# Patient Record
Sex: Male | Born: 1970
Health system: Southern US, Community
[De-identification: ages and names within clinical notes are randomized; demographics above are authoritative.]

## PROBLEM LIST (undated history)

## (undated) ENCOUNTER — Ambulatory Visit

## (undated) DIAGNOSIS — N186 End stage renal disease: Secondary | ICD-10-CM

## (undated) DIAGNOSIS — N179 Acute kidney failure, unspecified: Secondary | ICD-10-CM

## (undated) DIAGNOSIS — L0231 Cutaneous abscess of buttock: Secondary | ICD-10-CM

## (undated) DIAGNOSIS — E119 Type 2 diabetes mellitus without complications: Secondary | ICD-10-CM

## (undated) DIAGNOSIS — Z992 Dependence on renal dialysis: Secondary | ICD-10-CM

## (undated) DIAGNOSIS — H409 Unspecified glaucoma: Secondary | ICD-10-CM

## (undated) DIAGNOSIS — H3500 Unspecified background retinopathy: Secondary | ICD-10-CM

## (undated) DIAGNOSIS — I10 Essential (primary) hypertension: Secondary | ICD-10-CM

## (undated) DIAGNOSIS — F32A Depression, unspecified: Secondary | ICD-10-CM

## (undated) DIAGNOSIS — K3532 Acute appendicitis with perforation and localized peritonitis, without abscess: Secondary | ICD-10-CM

## (undated) DIAGNOSIS — E669 Obesity, unspecified: Secondary | ICD-10-CM

## (undated) DIAGNOSIS — L409 Psoriasis, unspecified: Secondary | ICD-10-CM

## (undated) DIAGNOSIS — E1140 Type 2 diabetes mellitus with diabetic neuropathy, unspecified: Secondary | ICD-10-CM

## (undated) HISTORY — PX: INCISION AND DRAINAGE: SHX5863

## (undated) HISTORY — PX: CATARACT EXTRACTION: SUR2

## (undated) HISTORY — PX: OTHER SURGICAL HISTORY: SHX169

## (undated) HISTORY — DX: Obesity, unspecified: E66.9

## (undated) HISTORY — DX: Psoriasis, unspecified: L40.9

## (undated) HISTORY — DX: Unspecified background retinopathy: H35.00

## (undated) HISTORY — DX: Unspecified glaucoma: H40.9

## (undated) HISTORY — DX: Cutaneous abscess of buttock: L02.31

## (undated) HISTORY — DX: Type 2 diabetes mellitus with diabetic neuropathy, unspecified: E11.40

## (undated) HISTORY — DX: Depression, unspecified: F32.A

## (undated) HISTORY — DX: Type 2 diabetes mellitus without complications: E11.9

## (undated) HISTORY — PX: ILEOCECETOMY: SHX5857

## (undated) HISTORY — DX: Dependence on renal dialysis: Z99.2

## (undated) HISTORY — PX: APPENDECTOMY: SHX54

---
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
## 2012-07-09 ENCOUNTER — Encounter (HOSPITAL_COMMUNITY): Payer: Self-pay | Admitting: Emergency Medicine

## 2012-07-09 ENCOUNTER — Emergency Department (HOSPITAL_COMMUNITY)
Admission: EM | Admit: 2012-07-09 | Discharge: 2012-07-09 | Disposition: A | Discharge status: Home / Self Care | Payer: BC Managed Care – PPO | Source: Home / Self Care | Attending: Emergency Medicine | Admitting: Emergency Medicine

## 2012-07-09 DIAGNOSIS — L0231 Cutaneous abscess of buttock: Secondary | ICD-10-CM

## 2012-07-09 MED ORDER — SULFAMETHOXAZOLE-TRIMETHOPRIM 800-160 MG PO TABS: 1.0000 | ORAL_TABLET | Freq: Two times a day (BID) | ORAL | Status: DC

## 2012-07-09 MED ORDER — NAPROXEN 500 MG PO TABS: 500.0000 mg | ORAL_TABLET | Freq: Two times a day (BID) | ORAL | Status: DC

## 2012-07-09 MED ORDER — CEPHALEXIN 500 MG PO CAPS: 1000.0000 mg | ORAL_CAPSULE | Freq: Three times a day (TID) | ORAL | Status: DC

## 2012-07-09 NOTE — ED Notes (Signed)
Patient reports bump on buttocks.  Denies history of the same.  Reports pimple that has become infected on left buttocks.  Noticed bump sunday

## 2012-07-09 NOTE — ED Provider Notes (Signed)
History    CSN: [id number] Arrival date & time 07/09/12  1119  First MD Initiated Contact with Patient 07/09/12 1242     Chief Complaint  Patient presents with  . Abscess   (Consider location/radiation/quality/duration/timing/severity/associated sxs/prior Treatment) HPI Comments: 42 year old male presents complaining of red swollen area on the inside of his left butt cheek that has been getting more swollen and painful for the past 3 days. He thinks it started as a pimple that has gotten infected.  History reviewed. No pertinent past medical history. History reviewed. No pertinent past surgical history. No family history on file. History  Substance Use Topics  . Smoking status: Never Smoker   . Smokeless tobacco: Not on file  . Alcohol Use: No    Review of Systems  Constitutional: Negative for fever, chills and fatigue.  HENT: Negative for sore throat, neck pain and neck stiffness.   Eyes: Negative for visual disturbance.  Respiratory: Negative for cough and shortness of breath.   Cardiovascular: Negative for chest pain, palpitations and leg swelling.  Gastrointestinal: Negative for nausea, vomiting, abdominal pain, diarrhea and constipation.  Genitourinary: Negative for dysuria, urgency, frequency and hematuria.  Musculoskeletal: Negative for myalgias and arthralgias.  Skin: Positive for wound (skin infection at gluteal cleft, see HPI).  Neurological: Negative for dizziness, weakness and light-headedness.    Allergies  Review of patient's allergies indicates no known allergies.  Home Medications   Current Outpatient Rx  Name  Route  Sig  Dispense  Refill  . naproxen (NAPROSYN) 500 MG tablet   Oral   Take 1 tablet (500 mg total) by mouth 2 (two) times daily.   60 tablet   0   . sulfamethoxazole-trimethoprim (SEPTRA DS) 800-160 MG per tablet   Oral   Take 1 tablet by mouth every 12 (twelve) hours.   14 tablet   0    BP 131/81  Pulse 114  Temp(Src) 97.9 F  (36.6 C) (Oral)  Resp 16  SpO2 98% Physical Exam  Constitutional: He is oriented to person, place, and time. He appears well-developed and well-nourished. No distress.  HENT:  Head: Normocephalic and atraumatic.  Eyes: EOM are normal. Pupils are equal, round, and reactive to light.  Cardiovascular: Normal rate and regular rhythm.  Exam reveals no gallop and no friction rub.   No murmur heard. Pulmonary/Chest: Effort normal and breath sounds normal. No respiratory distress. He has no wheezes. He has no rales.  Abdominal: Soft. There is no tenderness.  Neurological: He is oriented to person, place, and time.  Skin: Skin is warm and dry. No rash noted.  Area of erythematous, swelling, induration on the left superior gluteal cleft. Induration spans about 8 cm vertically. No definite area of purulence and fluctuance  Psychiatric: He has a normal mood and affect. Judgment normal.    ED Course  Procedures (including critical care time) Labs Reviewed - No data to display No results found. 1. Abscess, gluteal cleft    Risks and benefits of incision and drainage procedure explained to patient. He elected to proceed. Skin cleaned with alcohol swab. Local anesthesia with 2% lidocaine with epinephrine. Adequate analgesia obtained. Abscess incised with minimal purulent, bloody drainage. Wound further explored with ultrasound, no fluid collection is seen. It was packed with 3 cm of quarter inch iodoform gauze and dressed.   MDM  Take antibiotics as directed. Warm sitz baths 2-3 times daily. Followup in 3 days.   Meds ordered this encounter  Medications  . DISCONTD:  cephALEXin (KEFLEX) 500 MG capsule    Sig: Take 2 capsules (1,000 mg total) by mouth 3 (three) times daily.    Dispense:  42 capsule    Refill:  0  . DISCONTD: naproxen (NAPROSYN) 500 MG tablet    Sig: Take 1 tablet (500 mg total) by mouth 2 (two) times daily.    Dispense:  60 tablet    Refill:  0  . sulfamethoxazole-trimethoprim  (SEPTRA DS) 800-160 MG per tablet    Sig: Take 1 tablet by mouth every 12 (twelve) hours.    Dispense:  14 tablet    Refill:  0  . naproxen (NAPROSYN) 500 MG tablet    Sig: Take 1 tablet (500 mg total) by mouth 2 (two) times daily.    Dispense:  60 tablet    Refill:  0        Liam Graham, PA-C 07/09/12 1431

## 2012-07-09 NOTE — ED Provider Notes (Signed)
Medical screening examination/treatment/procedure(s) were performed by non-physician practitioner and as supervising physician I was immediately available for consultation/collaboration.  Burnett Kanaris, MD 07/09/12 1517

## 2012-07-14 ENCOUNTER — Encounter (INDEPENDENT_AMBULATORY_CARE_PROVIDER_SITE_OTHER): Payer: Self-pay | Admitting: Surgery

## 2012-07-14 ENCOUNTER — Emergency Department (INDEPENDENT_AMBULATORY_CARE_PROVIDER_SITE_OTHER)
Admission: EM | Admit: 2012-07-14 | Discharge: 2012-07-14 | Disposition: A | Discharge status: Home / Self Care | Payer: BC Managed Care – PPO | Source: Home / Self Care | Attending: Family Medicine | Admitting: Family Medicine

## 2012-07-14 ENCOUNTER — Ambulatory Visit (INDEPENDENT_AMBULATORY_CARE_PROVIDER_SITE_OTHER): Payer: BC Managed Care – PPO | Admitting: Surgery

## 2012-07-14 VITALS — BP 118/78 | HR 76 | Temp 97.3°F | Resp 16 | Ht 71.0 in | Wt 197.4 lb

## 2012-07-14 DIAGNOSIS — L0231 Cutaneous abscess of buttock: Secondary | ICD-10-CM

## 2012-07-14 DIAGNOSIS — K611 Rectal abscess: Secondary | ICD-10-CM | POA: Insufficient documentation

## 2012-07-14 DIAGNOSIS — K612 Anorectal abscess: Secondary | ICD-10-CM

## 2012-07-14 MED ORDER — TRAMADOL HCL 50 MG PO TABS: 50.0000 mg | ORAL_TABLET | Freq: Four times a day (QID) | ORAL | Status: DC | PRN

## 2012-07-14 MED ORDER — IBUPROFEN 600 MG PO TABS: 600.0000 mg | ORAL_TABLET | Freq: Three times a day (TID) | ORAL | Status: DC | PRN

## 2012-07-14 MED ORDER — DOXYCYCLINE HYCLATE 100 MG PO TABS: 100.0000 mg | ORAL_TABLET | Freq: Two times a day (BID) | ORAL | Status: DC

## 2012-07-14 MED ORDER — HYDROCODONE-ACETAMINOPHEN 5-325 MG PO TABS: 1.0000 | ORAL_TABLET | ORAL | Status: DC | PRN

## 2012-07-14 NOTE — Progress Notes (Signed)
Subjective:     Patient ID: Alex Morrison, male   DOB: Aug 24, 1970, 42 y.o.   MRN: [name]  HPI He is sent over here by urgent care for evaluation of a perirectal abscess. Apparently an incision and drainage was done 5 days ago and he was told to leave the packing in place. He was placed on Septra. He returned to urgent care and was referred here  Review of Systems     Objective:   Physical Exam On exam, there still a moderate amount of induration and cellulitis. I prepped the area Betadine, anesthetized with lidocaine, made the incision larger with a scalpel and then entered a larger abscess cavity with a hemostat. I then packed this with gauze    Assessment:     Perirectal abscess     Plan:     I am going to change him to doxycycline and also start him on Vicodin.  He needs to see the urgent office this Thursday for wound check

## 2012-07-14 NOTE — ED Notes (Signed)
States following up on boil.   States still in constant pain, no drainage.  Experienced some vomiting this morning only.  Antibiotics was given and patient is still taken.  Patient was given naproxen but made his stomach hurt so he only is taking Ibuprofen.

## 2012-07-14 NOTE — ED Provider Notes (Signed)
History    CSN: [id number] Arrival date & time 07/14/12  1145  First MD Initiated Contact with Patient 07/14/12 1208     Chief Complaint  Patient presents with  . Recurrent Skin Infections   (Consider location/radiation/quality/duration/timing/severity/associated sxs/prior Treatment) HPI Comments: 42 year old nondiabetic male here for followup of a left gluteal abscess. Patient had I&D and packing by one of our providers here on June 25 (as per records there was minimal purulent drainage after I&D on last visit) No wound culture pending. Patient understood he had to "keep the packing for as long as possible" and comes today because he's pain is not improving. Denies significant drainage. Denies fever or chills. He is currently taking Septra date 5/10. Is taking naproxen for pain inconsistently as reports naproxen is "making the heart flutter". Denies painful defecation. No history of Crohn's or ulcerative colitis.   No past medical history on file. No past surgical history on file. No family history on file. History  Substance Use Topics  . Smoking status: Never Smoker   . Smokeless tobacco: Not on file  . Alcohol Use: No    Review of Systems  Constitutional: Negative for fever and chills.  Gastrointestinal: Negative for nausea and vomiting.  Skin: Positive for wound.  Neurological: Negative for dizziness and headaches.  All other systems reviewed and are negative.    Allergies  Review of patient's allergies indicates no known allergies.  Home Medications   Current Outpatient Rx  Name  Route  Sig  Dispense  Refill  . ibuprofen (ADVIL,MOTRIN) 600 MG tablet   Oral   Take 1 tablet (600 mg total) by mouth every 8 (eight) hours as needed for pain.   21 tablet   0   . sulfamethoxazole-trimethoprim (SEPTRA DS) 800-160 MG per tablet   Oral   Take 1 tablet by mouth every 12 (twelve) hours.   14 tablet   0   . traMADol (ULTRAM) 50 MG tablet   Oral   Take 1 tablet (50 mg  total) by mouth every 6 (six) hours as needed for pain.   20 tablet   0    BP 124/82  Pulse 102  Temp(Src) 98.4 F (36.9 C) (Oral)  Resp 18  SpO2 97% Physical Exam  Nursing note and vitals reviewed. Constitutional: He is oriented to person, place, and time. He appears well-developed and well-nourished. No distress.  Eyes: No scleral icterus.  Cardiovascular: Normal heart sounds.   Pulmonary/Chest: Breath sounds normal.  Abdominal: Soft. There is no tenderness.  Neurological: He is alert and oriented to person, place, and time.  Skin: He is not diaphoretic.  Mid left gluteal area: close to gluteal cleft. There is an area of induration with skin erythema and central skin peeling of about 5 cm x 7 cm, no fluctuations, there is a small less than 1 cm opening round wound with a small amount of packing about 2 cm that was easily pulled out. Small amount of purulent drainage. Area is tender to palpation.      ED Course  INCISION AND DRAINAGE Performed by: Randa Spike Authorized by: Randa Spike Consent: Verbal consent obtained. Risks and benefits: risks, benefits and alternatives were discussed Consent given by: patient Patient understanding: patient states understanding of the procedure being performed Patient consent: the patient's understanding of the procedure matches consent given Type: abscess Location: mid left gluteal area close to gluteal cleft. Anesthesia: local infiltration Local anesthetic: lidocaine 1% with epinephrine Anesthetic total: 2 ml Scalpel size:  11 Incision type: revision of prior incition. Complexity: simple Drainage: purulent Drainage amount: scant Comments: I kept getting constant small purulent return by compresing indurated tissue in lower medial area concerning for a deeper pocket . Area was cleaned and applied a dry dressing. Wound culture pending.   (including critical care time) Labs Reviewed  CULTURE, ROUTINE-ABSCESS   No results  found. 1. Abscess, gluteal, left     MDM  Impress possible deeper pus pocket I am not able to reach given constant small amount of purulent return by compression of indurated tissue close to the gluteal cleft. Wound culture pending. D/c'd naproxen, prescribed ibuprofen and tramadol. Scheduled appointment at [facility] [place] today.   Randa Spike, MD 07/14/12 [phone]

## 2012-07-15 ENCOUNTER — Telehealth (INDEPENDENT_AMBULATORY_CARE_PROVIDER_SITE_OTHER): Payer: Self-pay | Admitting: General Surgery

## 2012-07-15 ENCOUNTER — Inpatient Hospital Stay (HOSPITAL_COMMUNITY)
Admission: EM | Admit: 2012-07-15 | Discharge: 2012-07-18 | DRG: 294 | Disposition: A | Discharge status: Home / Self Care | Payer: BC Managed Care – PPO | Attending: Internal Medicine | Admitting: Internal Medicine

## 2012-07-15 ENCOUNTER — Encounter (HOSPITAL_COMMUNITY): Payer: Self-pay | Admitting: Emergency Medicine

## 2012-07-15 DIAGNOSIS — E1110 Type 2 diabetes mellitus with ketoacidosis without coma: Secondary | ICD-10-CM | POA: Diagnosis present

## 2012-07-15 DIAGNOSIS — Z833 Family history of diabetes mellitus: Secondary | ICD-10-CM

## 2012-07-15 DIAGNOSIS — E1010 Type 1 diabetes mellitus with ketoacidosis without coma: Principal | ICD-10-CM | POA: Diagnosis present

## 2012-07-15 DIAGNOSIS — K612 Anorectal abscess: Secondary | ICD-10-CM | POA: Diagnosis present

## 2012-07-15 DIAGNOSIS — E876 Hypokalemia: Secondary | ICD-10-CM | POA: Diagnosis present

## 2012-07-15 DIAGNOSIS — K611 Rectal abscess: Secondary | ICD-10-CM | POA: Diagnosis present

## 2012-07-15 DIAGNOSIS — L408 Other psoriasis: Secondary | ICD-10-CM | POA: Diagnosis present

## 2012-07-15 DIAGNOSIS — Z794 Long term (current) use of insulin: Secondary | ICD-10-CM

## 2012-07-15 LAB — CBC WITH DIFFERENTIAL/PLATELET
Basophils Absolute: 0 10*3/uL (ref 0.0–0.1)
Basophils Relative: 0 % (ref 0–1)
Hemoglobin: 16.2 g/dL (ref 13.0–17.0)
MCHC: 35.4 g/dL (ref 30.0–36.0)
Monocytes Relative: 8 % (ref 3–12)
Neutro Abs: 10.9 10*3/uL — ABNORMAL HIGH (ref 1.7–7.7)
Neutrophils Relative %: 81 % — ABNORMAL HIGH (ref 43–77)
RDW: 13 % (ref 11.5–15.5)
WBC: 13.5 10*3/uL — ABNORMAL HIGH (ref 4.0–10.5)

## 2012-07-15 LAB — COMPREHENSIVE METABOLIC PANEL
ALT: 9 U/L (ref 0–53)
AST: 9 U/L (ref 0–37)
Albumin: 3.6 g/dL (ref 3.5–5.2)
Alkaline Phosphatase: 132 U/L — ABNORMAL HIGH (ref 39–117)
Chloride: 98 mEq/L (ref 96–112)
Potassium: 5.5 mEq/L — ABNORMAL HIGH (ref 3.5–5.1)
Total Bilirubin: 0.3 mg/dL (ref 0.3–1.2)

## 2012-07-15 MED ORDER — SODIUM CHLORIDE 0.9 % IV BOLUS (SEPSIS)
2000.0000 mL | Freq: Once | INTRAVENOUS | Status: AC
  Administered 2012-07-16: 2000 mL via INTRAVENOUS

## 2012-07-15 MED ORDER — SODIUM CHLORIDE 0.9 % IV SOLN: INTRAVENOUS | Status: DC

## 2012-07-15 MED ORDER — ONDANSETRON HCL 4 MG/2ML IJ SOLN
4.0000 mg | INTRAMUSCULAR | Status: DC | PRN
  Administered 2012-07-16: 4 mg via INTRAVENOUS
  Filled 2012-07-15: qty 2

## 2012-07-15 NOTE — Telephone Encounter (Signed)
He was calling to see if we had sent in his RX to the [facility] for antibiotics. I reviewed his chart and made him aware his doxycycline was sent to [facility] on [address] because this is the pharmacy we have on file. He will pick this up from there. He is asking if this peri-rectal abscess could cause fatigue. He states he gets "worn out" walking across his house. I explained that it should not be related. If this continues he should see his primary medical MD. He will call with any questions/problems.

## 2012-07-15 NOTE — ED Notes (Signed)
Patient states cannot give urine sample at this time.

## 2012-07-15 NOTE — ED Notes (Signed)
PT. REPORTS NAUSEA AND VOMITTING ONSET Sunday , DENIES DIARRHEA , NO FEVER OR CHILLS.

## 2012-07-16 ENCOUNTER — Encounter (HOSPITAL_COMMUNITY): Payer: Self-pay | Admitting: Internal Medicine

## 2012-07-16 ENCOUNTER — Emergency Department (HOSPITAL_COMMUNITY): Payer: BC Managed Care – PPO

## 2012-07-16 DIAGNOSIS — K612 Anorectal abscess: Secondary | ICD-10-CM

## 2012-07-16 DIAGNOSIS — E1110 Type 2 diabetes mellitus with ketoacidosis without coma: Secondary | ICD-10-CM

## 2012-07-16 LAB — POCT I-STAT 3, VENOUS BLOOD GAS (G3P V): Acid-base deficit: 22 mmol/L — ABNORMAL HIGH (ref 0.0–2.0)

## 2012-07-16 LAB — URINALYSIS W MICROSCOPIC + REFLEX CULTURE
Glucose, UA: >1000 mg/dL — AB
Ketones, ur: >80 mg/dL — AB
Protein, ur: 30 mg/dL — AB

## 2012-07-16 LAB — BASIC METABOLIC PANEL
BUN: 26 mg/dL — ABNORMAL HIGH (ref 6–23)
CO2: 16 mEq/L — ABNORMAL LOW (ref 19–32)
CO2: 18 mEq/L — ABNORMAL LOW (ref 19–32)
Calcium: 9.6 mg/dL (ref 8.4–10.5)
Calcium: 9.7 mg/dL (ref 8.4–10.5)
Chloride: 112 mEq/L (ref 96–112)
Creatinine, Ser: 1.03 mg/dL (ref 0.50–1.35)
Creatinine, Ser: 0.97 mg/dL (ref 0.50–1.35)
GFR calc Af Amer: >90 mL/min (ref >90)
GFR calc Af Amer: >90 mL/min (ref >90)
GFR calc Af Amer: >90 mL/min (ref >90)
GFR calc non Af Amer: 86 mL/min — ABNORMAL LOW (ref >90)
GFR calc non Af Amer: 88 mL/min — ABNORMAL LOW (ref >90)
GFR calc non Af Amer: >90 mL/min (ref >90)
Glucose, Bld: 336 mg/dL — ABNORMAL HIGH (ref 70–99)
Glucose, Bld: 139 mg/dL — ABNORMAL HIGH (ref 70–99)
Potassium: 4.9 mEq/L (ref 3.5–5.1)
Potassium: 4.2 mEq/L (ref 3.5–5.1)
Potassium: 3.5 mEq/L (ref 3.5–5.1)
Sodium: 140 mEq/L (ref 135–145)
Sodium: 142 mEq/L (ref 135–145)
Sodium: 141 mEq/L (ref 135–145)

## 2012-07-16 LAB — LIPID PANEL
HDL: 53 mg/dL (ref >39)
Total CHOL/HDL Ratio: 3.9 RATIO
Triglycerides: 102 mg/dL (ref <150)

## 2012-07-16 LAB — CBC WITH DIFFERENTIAL/PLATELET
Basophils Absolute: 0 10*3/uL (ref 0.0–0.1)
Eosinophils Absolute: 0 10*3/uL (ref 0.0–0.7)
HCT: 39 % (ref 39.0–52.0)
Lymphocytes Relative: 11 % — ABNORMAL LOW (ref 12–46)
MCHC: 35.4 g/dL (ref 30.0–36.0)
Monocytes Relative: 4 % (ref 3–12)
Neutro Abs: 12.4 10*3/uL — ABNORMAL HIGH (ref 1.7–7.7)
Neutrophils Relative %: 85 % — ABNORMAL HIGH (ref 43–77)
Platelets: 254 10*3/uL (ref 150–400)
RDW: 13.2 % (ref 11.5–15.5)
WBC Morphology: INCREASED
WBC: 14.6 10*3/uL — ABNORMAL HIGH (ref 4.0–10.5)

## 2012-07-16 LAB — GLUCOSE, CAPILLARY
Glucose-Capillary: 118 mg/dL — ABNORMAL HIGH (ref 70–99)
Glucose-Capillary: 125 mg/dL — ABNORMAL HIGH (ref 70–99)
Glucose-Capillary: 150 mg/dL — ABNORMAL HIGH (ref 70–99)
Glucose-Capillary: 151 mg/dL — ABNORMAL HIGH (ref 70–99)
Glucose-Capillary: 162 mg/dL — ABNORMAL HIGH (ref 70–99)
Glucose-Capillary: 179 mg/dL — ABNORMAL HIGH (ref 70–99)
Glucose-Capillary: 183 mg/dL — ABNORMAL HIGH (ref 70–99)
Glucose-Capillary: 216 mg/dL — ABNORMAL HIGH (ref 70–99)
Glucose-Capillary: 222 mg/dL — ABNORMAL HIGH (ref 70–99)
Glucose-Capillary: 241 mg/dL — ABNORMAL HIGH (ref 70–99)
Glucose-Capillary: 390 mg/dL — ABNORMAL HIGH (ref 70–99)
Glucose-Capillary: 513 mg/dL — ABNORMAL HIGH (ref 70–99)

## 2012-07-16 LAB — MRSA PCR SCREENING: MRSA by PCR: NEGATIVE

## 2012-07-16 LAB — LIPASE, BLOOD: Lipase: 27 U/L (ref 11–59)

## 2012-07-16 LAB — HEMOGLOBIN A1C: Hgb A1c MFr Bld: 12.7 % — ABNORMAL HIGH (ref <5.7)

## 2012-07-16 LAB — OSMOLALITY: Osmolality: 338 mOsm/kg — ABNORMAL HIGH (ref 275–300)

## 2012-07-16 MED ORDER — POTASSIUM CHLORIDE 10 MEQ/100ML IV SOLN
10.0000 meq | INTRAVENOUS | Status: AC
  Administered 2012-07-16 (×4): 10 meq via INTRAVENOUS

## 2012-07-16 MED ORDER — SODIUM CHLORIDE 0.9 % IV SOLN: INTRAVENOUS | Status: DC

## 2012-07-16 MED ORDER — SODIUM CHLORIDE 0.9 % IV SOLN
INTRAVENOUS | Status: DC
  Administered 2012-07-16 – 2012-07-17 (×4): via INTRAVENOUS

## 2012-07-16 MED ORDER — SODIUM CHLORIDE 0.9 % IV SOLN
INTRAVENOUS | Status: DC
  Administered 2012-07-16: 4.3 [IU]/h via INTRAVENOUS
  Filled 2012-07-16: qty 1

## 2012-07-16 MED ORDER — SODIUM CHLORIDE 0.9 % IV SOLN
INTRAVENOUS | Status: DC
  Administered 2012-07-16: 4.5 [IU]/h via INTRAVENOUS
  Filled 2012-07-16: qty 1

## 2012-07-16 MED ORDER — LEVOFLOXACIN IN D5W 750 MG/150ML IV SOLN
750.0000 mg | INTRAVENOUS | Status: DC
  Administered 2012-07-16 – 2012-07-17 (×2): 750 mg via INTRAVENOUS
  Filled 2012-07-16 (×3): qty 150

## 2012-07-16 MED ORDER — DEXTROSE 50 % IV SOLN: 25.0000 mL | INTRAVENOUS | Status: DC | PRN

## 2012-07-16 MED ORDER — VANCOMYCIN HCL 10 G IV SOLR
1750.0000 mg | Freq: Once | INTRAVENOUS | Status: AC
  Administered 2012-07-16: 1750 mg via INTRAVENOUS
  Filled 2012-07-16: qty 1750

## 2012-07-16 MED ORDER — LIVING WELL WITH DIABETES BOOK
Freq: Once | Status: AC
  Administered 2012-07-16: 12:00:00
  Filled 2012-07-16: qty 1

## 2012-07-16 MED ORDER — DEXTROSE-NACL 5-0.45 % IV SOLN
INTRAVENOUS | Status: DC
  Administered 2012-07-16: 05:00:00 via INTRAVENOUS

## 2012-07-16 MED ORDER — SODIUM CHLORIDE 0.9 % IV BOLUS (SEPSIS)
1000.0000 mL | Freq: Once | INTRAVENOUS | Status: AC
  Administered 2012-07-16: 1000 mL via INTRAVENOUS

## 2012-07-16 MED ORDER — POTASSIUM CHLORIDE 10 MEQ/100ML IV SOLN
INTRAVENOUS | Status: AC
  Filled 2012-07-16: qty 400

## 2012-07-16 MED ORDER — VANCOMYCIN HCL IN DEXTROSE 1-5 GM/200ML-% IV SOLN
1000.0000 mg | Freq: Three times a day (TID) | INTRAVENOUS | Status: DC
  Administered 2012-07-16 – 2012-07-18 (×6): 1000 mg via INTRAVENOUS
  Filled 2012-07-16 (×9): qty 200

## 2012-07-16 MED ORDER — DEXTROSE-NACL 5-0.45 % IV SOLN: INTRAVENOUS | Status: DC

## 2012-07-16 MED ORDER — ENOXAPARIN SODIUM 40 MG/0.4ML ~~LOC~~ SOLN
40.0000 mg | SUBCUTANEOUS | Status: DC
  Administered 2012-07-16: 40 mg via SUBCUTANEOUS
  Filled 2012-07-16: qty 0.4

## 2012-07-16 NOTE — ED Notes (Signed)
Admitting MD at bedside, pt awaiting inpt beds assignment.

## 2012-07-16 NOTE — Progress Notes (Signed)
CRITICAL VALUE ALERT  Critical value received: CO2 7  Date of notification:  07/16/12  Time of notification:  0510  Critical value read back: yes  Nurse who received alert: Honor Loh RN  MD notified (1st page): Text paged Forrest Moron   Time of first [id number]

## 2012-07-16 NOTE — Progress Notes (Signed)
INITIAL NUTRITION ASSESSMENT  DOCUMENTATION CODES Per approved criteria  -Not Applicable   INTERVENTION:  Advance diet as medically appropriate  RD to follow for nutrition care plan, add interventions accordingly  NUTRITION DIAGNOSIS: Inadequate oral intake related to inability to eat as evidenced by NPO status  Goal: Patient to meet >/= 90% of their estimated nutrition needs  Monitor:  PO diet advancement & intake, appropriateness for education, weight, labs, I/O's  Reason for Assessment: Consult, Malnutrition Screening Tool Report  42 y.o. male  Admitting Dx: DKA (diabetic ketoacidosis)  ASSESSMENT: Patient no significant PMH admitted with nausea & vomiting for last 2-3 days; last week was found to have perirectal abscess which was incised and drained at Urgent Care; in ER patient was found to have high blood sugar with anion gap and acidosis.   RD unable to obtain nutrition hx at time of visit; patient laying in bed with vomit on pillow; per admission nutrition screen, patient reports he'd recently lost weight as well as eating poorly because of a decreased appetite.  RD consulted for new-onset DM diet education ---> patient not appropriate at this time.  Height: Ht Readings from Last 1 Encounters:  07/16/12 5' 10.87" (1.8 m)    Weight: Wt Readings from Last 1 Encounters:  07/16/12 198 lb 6.6 oz (90 kg)    Ideal Body Weight: 166 lb  % Ideal Body Weight: 119%  Wt Readings from Last 10 Encounters:  07/16/12 198 lb 6.6 oz (90 kg)  07/14/12 197 lb 6.4 oz (89.54 kg)    Usual Body Weight: unable to obtain  % Usual Body Weight: ---  BMI:  Body mass index is 27.78 kg/(m^2).  Estimated Nutritional Needs: Kcal: 2000-2200 Protein: 100-110 gm Fluid: 2.0-2.2 L  Skin: incision   Diet Order: NPO  EDUCATION NEEDS: -Education not appropriate at this time   Intake/Output Summary (Last 24 hours) at 07/16/12 1406 Last data filed at 07/16/12 1200  Gross per 24  hour  Intake 2103.84 ml  Output    800 ml  Net 1303.84 ml    Labs:   Recent Labs Lab 07/15/12 2206 07/16/12 0400 07/16/12 0835  NA 136 140 140  K 5.5* 4.9 4.2  CL 98 108 110  CO2 <7* 7* 12*  BUN 23 27* 26*  CREATININE 1.09 1.05 1.03  CALCIUM 10.6* 9.6 9.6  GLUCOSE 455* 336* 233*    CBG (last 3)   Recent Labs  07/16/12 0934 07/16/12 1030 07/16/12 1133  GLUCAP 190* 183* 179*    Scheduled Meds: . vancomycin  1,000 mg Intravenous Q8H    Continuous Infusions: . sodium chloride Stopped (07/16/12 0523)  . dextrose 5 % and 0.45% NaCl 125 mL/hr at 07/16/12 0526  . insulin (NOVOLIN-R) infusion 7.3 Units/hr (07/16/12 1328)    Past Medical History  Diagnosis Date  . Abscess of buttock     Past Surgical History  Procedure Laterality Date  . Incision and drainage      Arthur Holms, RD, LDN Pager #: [phone] After-Hours Pager #: [phone]

## 2012-07-16 NOTE — H&P (Addendum)
[facility] History and Physical  Alex Morrison [id number] DOB: 1970-11-07 DOA: 07/15/2012  Referring physician: ER physician. PCP: No PCP Per Patient  Specialists: None.  Chief Complaint: Nausea vomiting and weakness.  HPI: Alex Morrison is a 42 y.o. male with no significant past medical history has been experiencing nausea vomiting for last 2-3 days. Patient also has been having weakness and fatigue. Patient last week was found to have a perirectal abscess which was incised and drained at the urgent care and patient was placed on antibiotics and had followed with Dr. Ninfa Linden surgeon who had changed antibiotics to doxycycline. Patient otherwise denies any diarrhea abdominal pain chest pain shortness of breath any focal deficit headache or any blurred vision. In the ER patient was found to have high blood sugar with anion gap and acidosis. Initially critical care was consulted by ER physician and at this time hospitalist has been requested for admission.  Review of Systems: As presented in the history of presenting illness, rest negative.  Past Medical History  Diagnosis Date  . Abscess of buttock    Past Surgical History  Procedure Laterality Date  . Incision and drainage     Social History:  reports that he has never smoked. He does not have any smokeless tobacco history on file. He reports that he does not drink alcohol or use illicit drugs. Home. where does patient live-- Can do ADLs. Can patient participate in ADLs?  No Known Allergies  Family History  Problem Relation Age of Onset  . Diabetes Mellitus II Mother   . Diabetes Mellitus I Father   . Diabetes Mellitus II Brother       Prior to Admission medications   Medication Sig Start Date End Date Taking? Authorizing Provider  HYDROcodone-acetaminophen (NORCO) 5-325 MG per tablet Take 1-2 tablets by mouth every 4 (four) hours as needed for pain. 07/14/12  Yes Harl Bowie, MD  ibuprofen (ADVIL,MOTRIN)  600 MG tablet Take 1 tablet (600 mg total) by mouth every 8 (eight) hours as needed for pain. 07/14/12  Yes Adlih Moreno-Coll, MD  sulfamethoxazole-trimethoprim (SEPTRA DS) 800-160 MG per tablet Take 1 tablet by mouth every 12 (twelve) hours. 07/09/12  Yes Liam Graham, PA-C  traMADol (ULTRAM) 50 MG tablet Take 1 tablet (50 mg total) by mouth every 6 (six) hours as needed for pain. 07/14/12  Yes Adlih Moreno-Coll, MD  doxycycline (VIBRA-TABS) 100 MG tablet Take 1 tablet (100 mg total) by mouth 2 (two) times daily. 07/14/12   Harl Bowie, MD   Physical Exam: Filed Vitals:   07/15/12 2202 07/16/12 0150 07/16/12 0200  BP: 132/102  149/76  Pulse: 108 130 127  Temp: 97.9 F (36.6 C) 98.3 F (36.8 C)   TempSrc: Oral    Resp: 14  26  SpO2: 98% 100% 100%     General:  Well-developed well-nourished.  Eyes: Anicteric no pallor.  ENT: No discharge from the ears eyes nose mouth. Tongue looks coated black.  Neck: No mass.  Cardiovascular: S1-S2 heard.  Respiratory: No rhonchi or crepitations.  Abdomen: Soft nontender bowel sounds present.  Skin: Psoriatic skin lesions. Left buttock has dressing which is stained.  Musculoskeletal: No edema.  Psychiatric: Appears normal.  Neurologic: Alert awake oriented to time place and person. Moves all extremities.  Labs on Admission:  Basic Metabolic Panel:  Recent Labs Lab 07/15/12 2206  NA 136  K 5.5*  CL 98  CO2 <7*  GLUCOSE 455*  BUN 23  CREATININE  1.09  CALCIUM 10.6*   Liver Function Tests:  Recent Labs Lab 07/15/12 2206  AST 9  ALT 9  ALKPHOS 132*  BILITOT 0.3  PROT 7.7  ALBUMIN 3.6    Recent Labs Lab 07/15/12 2354  LIPASE 27   No results found for this basename: AMMONIA,  in the last 168 hours CBC:  Recent Labs Lab 07/15/12 2206  WBC 13.5*  NEUTROABS 10.9*  HGB 16.2  HCT 45.8  MCV 82.5  PLT 280   Cardiac Enzymes: No results found for this basename: CKTOTAL, CKMB, CKMBINDEX, TROPONINI,  in the  last 168 hours  BNP (last 3 results) No results found for this basename: PROBNP,  in the last 8760 hours CBG:  Recent Labs Lab 07/16/12 0024 07/16/12 0210  GLUCAP 513* 390*    Radiological Exams on Admission: Dg Chest Port 1 View  07/16/2012   *RADIOLOGY REPORT*  Clinical Data: Vomiting.  PORTABLE CHEST - 1 VIEW  Comparison: None.  Findings: Lungs are clear.  Heart size is normal.  No pneumothorax or pleural effusion.  IMPRESSION: Negative exam.   Original Report Authenticated By: Orlean Patten, M.D.   EKG shows sinus tachycardia.  Assessment/Plan Principal Problem:   DKA (diabetic ketoacidosis) Active Problems:   Perirectal abscess   1. Diabetic ketoacidosis with new-onset diabetes mellitus - patient has severe diabetic ketoacidosis. I think patient's DKA was precipitated by his perirectal abscess. Patient has had received 4 L normal saline bolus. Patient will be placed on additional fluid with aggressive hydration and IV insulin infusion until anion gap gets corrected. Check hemoglobin A1c. Closely follow metabolic panel. Patient will be admitted to step down. Check troponins. Patient does have mild hyperkalemia which I think will improve with IV insulin. 2. Perirectal abscess status post I&D - check wound culture. Patient at this time has been placed on vancomycin IV. 3. Nausea vomiting probably secondary to diabetic ketoacidosis -  Abdomen appears benign on exam. Follow clinically. 4. History of Psoriasis.    Code Status: Full code.  Family Communication: Patient's brother at the bedside.  Disposition Plan: Admit to inpatient.    Mikalah Skyles N. [facility] Pager [phone].  If 7PM-7AM, please contact night-coverage [email or web address] Password [facility] 07/16/2012, 2:51 AM

## 2012-07-16 NOTE — Progress Notes (Addendum)
[facility] Progress Note [place] TEAM 1 - Stepdown/ICU TEAM   Alex Morrison [id number] DOB: [birth year] DOA: 07/15/2012 PCP: No PCP Per Patient  Brief narrative: 42 yo male with no significant past medical history had been experiencing nausea with vomiting for 2-3 days with associated weakness and fatigue. Patient was found to have a perirectal abscess in the week prior to his admit which was incised and drained at an urgent care.  Patient was placed on antibiotics and had followed with Dr. Ninfa Linden (General Surgeon) who had changed antibiotics to doxycycline. Patient otherwise denied diarrhea abdominal pain chest pain shortness of breath headache or blurred vision. In the ER patient was found to have high blood sugar with anion gap and acidosis.  Assessment/Plan:  DKA with newly diagnosed DM  Perirectal abscess s/p I&D  Code Status: FULL Family Communication: No family present at time of exam Disposition Plan: SDU  Consultants: none  Procedures: none  Antibiotics: Vancomycin 7/2 >> Levaquin 7/2 >>  DVT prophylaxis: lovenox  HPI/Subjective: Followup visit completed   Objective: Blood pressure 114/63, pulse 103, temperature 98.9 F (37.2 C), temperature source Oral, resp. rate 18, height 5' 10.87" (1.8 m), weight 90 kg (198 lb 6.6 oz), SpO2 98.00%.  Intake/Output Summary (Last 24 hours) at 07/16/12 1250 Last data filed at 07/16/12 1200  Gross per 24 hour  Intake 2103.84 ml  Output    800 ml  Net 1303.84 ml    Exam: Followup exam completed  Data Reviewed: Basic Metabolic Panel:  Recent Labs Lab 07/15/12 2206 07/16/12 0400 07/16/12 0835  NA 136 140 140  K 5.5* 4.9 4.2  CL 98 108 110  CO2 <7* 7* 12*  GLUCOSE 455* 336* 233*  BUN 23 27* 26*  CREATININE 1.09 1.05 1.03  CALCIUM 10.6* 9.6 9.6   Liver Function Tests:  Recent Labs Lab 07/15/12 2206  AST 9  ALT 9  ALKPHOS 132*  BILITOT 0.3  PROT 7.7  ALBUMIN 3.6    Recent Labs Lab  07/15/12 2354  LIPASE 27   CBC:  Recent Labs Lab 07/15/12 2206 07/16/12 0400  WBC 13.5* 14.6*  NEUTROABS 10.9* 12.4*  HGB 16.2 13.8  HCT 45.8 39.0  MCV 82.5 81.4  PLT 280 254   Cardiac Enzymes:  Recent Labs Lab 07/16/12 0400  TROPONINI <0.30   CBG:  Recent Labs Lab 07/16/12 0723 07/16/12 0828 07/16/12 0934 07/16/12 1030 07/16/12 1133  GLUCAP 216* 223* 190* 183* 179*    Recent Results (from the past 240 hour(s))  CULTURE, ROUTINE-ABSCESS     Status: None   Collection Time    07/14/12  3:32 PM      Result Value Range Status   Specimen Description ABSCESS BUTTOCKS LEFT   Final   Special Requests NONE   Final   Gram Stain     Final   Value: NO WBC SEEN     NO SQUAMOUS EPITHELIAL CELLS SEEN     RARE GRAM POSITIVE COCCI IN PAIRS   Culture Culture reincubated for better growth   Final   Report Status PENDING   Incomplete  MRSA PCR SCREENING     Status: None   Collection Time    07/16/12  2:53 AM      Result Value Range Status   MRSA by PCR NEGATIVE  NEGATIVE Final   Comment:            The GeneXpert MRSA Assay (FDA     approved for NASAL specimens  only), is one component of a     comprehensive MRSA colonization     surveillance program. It is not     intended to diagnose MRSA     infection nor to guide or     monitor treatment for     MRSA infections.     Studies:  Recent x-ray studies have been reviewed in detail by the Attending Physician  Scheduled Meds:  Scheduled Meds: . enoxaparin (LOVENOX) injection  40 mg Subcutaneous Q24H  . vancomycin  1,000 mg Intravenous Q8H   Continuous Infusions: . sodium chloride Stopped (07/16/12 0523)  . dextrose 5 % and 0.45% NaCl 125 mL/hr at 07/16/12 0526  . insulin (NOVOLIN-R) infusion 7.2 Units/hr (07/16/12 1230)    Time spent on care of this patient: 25+ mins  [place]  [phone] Pager - Text Page per Shea Evans as per below:  On-Call/Text Page:       [email or web address]      password [email or web address]  If 7PM-7AM, please contact night-coverage [email or web address] Password [email or web address] 07/16/2012, 12:50 PM   LOS: 1 day

## 2012-07-16 NOTE — Progress Notes (Signed)
Utilization Review Completed. 07/16/2012

## 2012-07-16 NOTE — Progress Notes (Signed)
Pt slept through most of shift: easily arousable. 2 episodes emesis. Parents are coming in from [place] today. Will start Diabetes education and encourage pt to choose a PCP tomorrow when he is feeling better.

## 2012-07-16 NOTE — Progress Notes (Signed)
7/2  Spoke with patient about his current condition with new onset diabetes.  States that his mother and father both have diabetes.  Was seen at an Urgent Care last week for a perirectal abscess drain, but no blood work was done at that time.  Has not been feeling well over the last 2-3 days.  Is not feeling well at this time, but I told him that staff would be following up with diabetes education including having him watch DM videos, teaching him to check own CBGs, speak with a dietician, and to be sure that he has a PCP to follow him at discharge.  HgbA1C is being checked. Staff nurse to follow up with education.  Will continue to follow while in hospital.

## 2012-07-16 NOTE — ED Provider Notes (Signed)
History    CSN: [name] Arrival date & time 07/15/12  2148  First MD Initiated Contact with Patient 07/15/12 2334     Chief Complaint  Patient presents with  . Emesis    HPI Pt was seen at 2345.  Per pt and his friend, c/o gradual onset and worsening of multiple intermittent episodes of N/V that began 3 days ago. Has been associated with generalized fatigue/weakness. Denies diarrhea, no fevers, no CP/palpitations, no SOB/cough, no abd pain, no black or blood in stools or emesis.    Past Medical History  Diagnosis Date  . Abscess of buttock    History reviewed. No pertinent past surgical history.  History  Substance Use Topics  . Smoking status: Never Smoker   . Smokeless tobacco: Not on file  . Alcohol Use: No    Review of Systems ROS: Statement: All systems negative except as marked or noted in the HPI; Constitutional: Negative for fever and chills. +generalized fatigue/weakness.; ; Eyes: Negative for eye pain, redness and discharge. ; ; ENMT: Negative for ear pain, hoarseness, nasal congestion, sinus pressure and sore throat. ; ; Cardiovascular: Negative for chest pain, palpitations, diaphoresis, dyspnea and peripheral edema. ; ; Respiratory: Negative for cough, wheezing and stridor. ; ; Gastrointestinal: +N/V. Negative for diarrhea, abdominal pain, blood in stool, hematemesis, jaundice and rectal bleeding. . ; ; Genitourinary: Negative for dysuria, flank pain and hematuria. ; ; Musculoskeletal: Negative for back pain and neck pain. Negative for swelling and trauma.; ; Skin: Negative for pruritus, rash, abrasions, blisters, bruising and skin lesion.; ; Neuro: Negative for headache, lightheadedness and neck stiffness. Negative for altered level of consciousness , altered mental status, extremity weakness, paresthesias, involuntary movement, seizure and syncope.       Allergies  Review of patient's allergies indicates no known allergies.  Home Medications   Current  Outpatient Rx  Name  Route  Sig  Dispense  Refill  . doxycycline (VIBRA-TABS) 100 MG tablet   Oral   Take 1 tablet (100 mg total) by mouth 2 (two) times daily.   14 tablet   2   . HYDROcodone-acetaminophen (NORCO) 5-325 MG per tablet   Oral   Take 1-2 tablets by mouth every 4 (four) hours as needed for pain.   30 tablet   1   . ibuprofen (ADVIL,MOTRIN) 600 MG tablet   Oral   Take 1 tablet (600 mg total) by mouth every 8 (eight) hours as needed for pain.   21 tablet   0   . sulfamethoxazole-trimethoprim (SEPTRA DS) 800-160 MG per tablet   Oral   Take 1 tablet by mouth every 12 (twelve) hours.   14 tablet   0   . traMADol (ULTRAM) 50 MG tablet   Oral   Take 1 tablet (50 mg total) by mouth every 6 (six) hours as needed for pain.   20 tablet   0    BP 132/102  Pulse 108  Temp(Src) 97.9 F (36.6 C) (Oral)  Resp 14  SpO2 98%  Physical Exam 2350: Physical examination:  Nursing notes reviewed; Vital signs and O2 SAT reviewed;  Constitutional: Thin, Uncomfortable appearing; Head:  Normocephalic, atraumatic; Eyes: EOMI, PERRL, No scleral icterus; ENMT: Mouth and pharynx normal, Mucous membranes dry; Neck: Supple, Full range of motion, No lymphadenopathy; Cardiovascular: Tachycardic rate and rhythm, No gallop; Respiratory: Breath sounds clear & equal bilaterally, No wheezes.  Speaking full sentences, Tachypneic, no retrax or access mm use. Normal respiratory effort/excursion; Chest: Nontender, Movement  normal; Abdomen: Soft, Nontender, Nondistended, Normal bowel sounds; Genitourinary: No CVA tenderness; Extremities: Pulses normal, No tenderness, No edema, No calf edema or asymmetry.; Neuro: AA&Ox3, Major CN grossly intact.  Speech clear. No gross focal motor or sensory deficits in extremities.; Skin: Color normal, Warm, Dry.   ED Course  Procedures    MDM  MDM Reviewed: previous chart, nursing note and vitals Interpretation: labs, ECG and x-ray Total time providing critical  care: 30-74 minutes. This excludes time spent performing separately reportable procedures and services. Consults: admitting MD   CRITICAL CARE Performed by: Alfonzo Feller Total critical care time: 35 Critical care time was exclusive of separately billable procedures and treating other patients. Critical care was necessary to treat or prevent imminent or life-threatening deterioration. Critical care was time spent personally by me on the following activities: development of treatment plan with patient and/or surrogate as well as nursing, discussions with consultants, evaluation of patient's response to treatment, examination of patient, obtaining history from patient or surrogate, ordering and performing treatments and interventions, ordering and review of laboratory studies, ordering and review of radiographic studies, pulse oximetry and re-evaluation of patient's condition.    Date: 07/16/2012  Rate: 129  Rhythm: sinus tachycardia  QRS Axis: normal  Intervals: normal  ST/T Wave abnormalities: nonspecific ST/T changes  Conduction Disutrbances:none  Narrative Interpretation:   Old EKG Reviewed: none available.  Results for orders placed during the hospital encounter of 07/15/12  CBC WITH DIFFERENTIAL      Result Value Range   WBC 13.5 (*) 4.0 - 10.5 K/uL   RBC 5.55  4.22 - 5.81 MIL/uL   Hemoglobin 16.2  13.0 - 17.0 g/dL   HCT 45.8  39.0 - 52.0 %   MCV 82.5  78.0 - 100.0 fL   MCH 29.2  26.0 - 34.0 pg   MCHC 35.4  30.0 - 36.0 g/dL   RDW 13.0  11.5 - 15.5 %   Platelets 280  150 - 400 K/uL   Neutrophils Relative % 81 (*) 43 - 77 %   Neutro Abs 10.9 (*) 1.7 - 7.7 K/uL   Lymphocytes Relative 10 (*) 12 - 46 %   Lymphs Abs 1.4  0.7 - 4.0 K/uL   Monocytes Relative 8  3 - 12 %   Monocytes Absolute 1.1 (*) 0.1 - 1.0 K/uL   Eosinophils Relative 0  0 - 5 %   Eosinophils Absolute 0.0  0.0 - 0.7 K/uL   Basophils Relative 0  0 - 1 %   Basophils Absolute 0.0  0.0 - 0.1 K/uL   COMPREHENSIVE METABOLIC PANEL      Result Value Range   Sodium 136  135 - 145 mEq/L   Potassium 5.5 (*) 3.5 - 5.1 mEq/L   Chloride 98  96 - 112 mEq/L   CO2 <7 (*) 19 - 32 mEq/L   Glucose, Bld 455 (*) 70 - 99 mg/dL   BUN 23  6 - 23 mg/dL   Creatinine, Ser 1.09  0.50 - 1.35 mg/dL   Calcium 10.6 (*) 8.4 - 10.5 mg/dL   Total Protein 7.7  6.0 - 8.3 g/dL   Albumin 3.6  3.5 - 5.2 g/dL   AST 9  0 - 37 U/L   ALT 9  0 - 53 U/L   Alkaline Phosphatase 132 (*) 39 - 117 U/L   Total Bilirubin 0.3  0.3 - 1.2 mg/dL   GFR calc non Af Amer 82 (*) >90 mL/min   GFR calc  Af Amer >90  >90 mL/min  GLUCOSE, CAPILLARY      Result Value Range   Glucose-Capillary 513 (*) 70 - 99 mg/dL  POCT I-STAT 3, BLOOD GAS (G3P V)      Result Value Range   pH, Ven 7.112 (*) 7.250 - 7.300   pCO2, Ven 15.0 (*) 45.0 - 50.0 mmHg   pO2, Ven 57.0 (*) 30.0 - 45.0 mmHg   Bicarbonate 4.8 (*) 20.0 - 24.0 mEq/L   TCO2 5  0 - 100 mmol/L   O2 Saturation 80.0     Acid-base deficit 22.0 (*) 0.0 - 2.0 mmol/L   Sample type VENOUS     Comment NOTIFIED PHYSICIAN     Dg Chest Port 1 View 07/16/2012   *RADIOLOGY REPORT*  Clinical Data: Vomiting.  PORTABLE CHEST - 1 VIEW  Comparison: None.  Findings: Lungs are clear.  Heart size is normal.  No pneumothorax or pleural effusion.  IMPRESSION: Negative exam.   Original Report Authenticated By: Orlean Patten, M.D.     0045:  New onset DM with DKA.  AG 31.  Pt denies hx of same.  IVF bolus in process, IV insulin gtt to be started. Dx and testing d/w pt and family.  Questions answered.  Verb understanding, agreeable to admit. T/C to PCCM Dr. Joya Gaskins, case discussed, including:  HPI, pertinent PM/SHx, VS/PE, dx testing, ED course and treatment:  requests to call Hospitalist service, CCM can consult prn. T/C to [facility] Dr. Hal Hope, case discussed, including:  HPI, pertinent PM/SHx, VS/PE, dx testing, ED course and treatment:  Agreeable to admit, requests to give IVF 3L total, write temporary  orders, obtain stepdown bed to team 10.   Alfonzo Feller, DO 07/17/12 2205

## 2012-07-16 NOTE — Progress Notes (Signed)
ANTIBIOTIC CONSULT NOTE - INITIAL  Pharmacy Consult for Vancomycin Indication: abcess  No Known Allergies  Patient Measurements: Height: 5' 10.87" (180 cm) Weight: 198 lb 6.6 oz (90 kg) IBW/kg (Calculated) : 74.99   Vital Signs: Temp: 98.3 F (36.8 C) (07/02 0150) Temp src: Oral (07/01 2202) BP: 149/76 mmHg (07/02 0200) Pulse Rate: 127 (07/02 0200)  Labs:  Recent Labs  07/15/12 2206  WBC 13.5*  HGB 16.2  PLT 280  CREATININE 1.09   Estimated Creatinine Clearance: 101.1 ml/min (by C-G formula based on Cr of 1.09). No results found for this basename: VANCOTROUGH, Corlis Leak, VANCORANDOM, GENTTROUGH, GENTPEAK, GENTRANDOM, TOBRATROUGH, TOBRAPEAK, TOBRARND, AMIKACINPEAK, AMIKACINTROU, AMIKACIN,  in the last 72 hours   Microbiology: Recent Results (from the past 720 hour(s))  CULTURE, ROUTINE-ABSCESS     Status: None   Collection Time    07/14/12  3:32 PM      Result Value Range Status   Specimen Description ABSCESS BUTTOCKS LEFT   Final   Special Requests NONE   Final   Gram Stain     Final   Value: NO WBC SEEN     NO SQUAMOUS EPITHELIAL CELLS SEEN     RARE GRAM POSITIVE COCCI IN PAIRS   Culture Culture reincubated for better growth   Final   Report Status PENDING   Incomplete    Medical History: Past Medical History  Diagnosis Date  . Abscess of buttock     Medications:  Prescriptions prior to admission  Medication Sig Dispense Refill  . HYDROcodone-acetaminophen (NORCO) 5-325 MG per tablet Take 1-2 tablets by mouth every 4 (four) hours as needed for pain.  30 tablet  1  . ibuprofen (ADVIL,MOTRIN) 600 MG tablet Take 1 tablet (600 mg total) by mouth every 8 (eight) hours as needed for pain.  21 tablet  0  . sulfamethoxazole-trimethoprim (SEPTRA DS) 800-160 MG per tablet Take 1 tablet by mouth every 12 (twelve) hours.  14 tablet  0  . traMADol (ULTRAM) 50 MG tablet Take 1 tablet (50 mg total) by mouth every 6 (six) hours as needed for pain.  20 tablet  0  .  doxycycline (VIBRA-TABS) 100 MG tablet Take 1 tablet (100 mg total) by mouth 2 (two) times daily.  14 tablet  2   Assessment: 42 yo male with perirectal abcess for empiric antibiotics  Goal of Therapy:  Vancomycin trough level 10-15 mcg/ml  Plan:  Vancomycin 1750 mg IV now, then 1 g IV q8h  Caryl Pina 07/16/2012,3:01 AM

## 2012-07-17 ENCOUNTER — Encounter (INDEPENDENT_AMBULATORY_CARE_PROVIDER_SITE_OTHER): Payer: BC Managed Care – PPO | Admitting: General Surgery

## 2012-07-17 DIAGNOSIS — E1110 Type 2 diabetes mellitus with ketoacidosis without coma: Secondary | ICD-10-CM | POA: Diagnosis present

## 2012-07-17 DIAGNOSIS — E1310 Other specified diabetes mellitus with ketoacidosis without coma: Secondary | ICD-10-CM

## 2012-07-17 LAB — BASIC METABOLIC PANEL
BUN: 24 mg/dL — ABNORMAL HIGH (ref 6–23)
CO2: 19 mEq/L (ref 19–32)
Calcium: 9.9 mg/dL (ref 8.4–10.5)
Chloride: 111 mEq/L (ref 96–112)
Creatinine, Ser: 0.85 mg/dL (ref 0.50–1.35)
Glucose, Bld: 158 mg/dL — ABNORMAL HIGH (ref 70–99)

## 2012-07-17 LAB — CBC
MCH: 28.2 pg (ref 26.0–34.0)
MCV: 80.5 fL (ref 78.0–100.0)
Platelets: 225 10*3/uL (ref 150–400)
RBC: 4.4 MIL/uL (ref 4.22–5.81)
RDW: 13.5 % (ref 11.5–15.5)
WBC: 10.3 10*3/uL (ref 4.0–10.5)

## 2012-07-17 LAB — GLUCOSE, CAPILLARY
Glucose-Capillary: 147 mg/dL — ABNORMAL HIGH (ref 70–99)
Glucose-Capillary: 331 mg/dL — ABNORMAL HIGH (ref 70–99)
Glucose-Capillary: 338 mg/dL — ABNORMAL HIGH (ref 70–99)
Glucose-Capillary: 322 mg/dL — ABNORMAL HIGH (ref 70–99)

## 2012-07-17 LAB — CULTURE, ROUTINE-ABSCESS: Gram Stain: NONE SEEN

## 2012-07-17 MED ORDER — DEXTROSE 50 % IV SOLN: 25.0000 mL | Freq: Once | INTRAVENOUS | Status: DC | PRN

## 2012-07-17 MED ORDER — INSULIN GLARGINE 100 UNIT/ML ~~LOC~~ SOLN
20.0000 [IU] | Freq: Every day | SUBCUTANEOUS | Status: DC
  Administered 2012-07-17: 20 [IU] via SUBCUTANEOUS
  Filled 2012-07-17 (×2): qty 0.2

## 2012-07-17 MED ORDER — INSULIN ASPART 100 UNIT/ML ~~LOC~~ SOLN
0.0000 [IU] | Freq: Every day | SUBCUTANEOUS | Status: DC
  Administered 2012-07-17: 3 [IU] via SUBCUTANEOUS

## 2012-07-17 MED ORDER — BOOST / RESOURCE BREEZE PO LIQD: 1.0000 | Freq: Three times a day (TID) | ORAL | Status: DC | PRN

## 2012-07-17 MED ORDER — INSULIN PEN STARTER KIT
1.0000 | Freq: Once | Status: DC
  Filled 2012-07-17: qty 1

## 2012-07-17 MED ORDER — GLUCOSE 40 % PO GEL: 1.0000 | ORAL | Status: DC | PRN

## 2012-07-17 MED ORDER — DEXTROSE 50 % IV SOLN: 50.0000 mL | Freq: Once | INTRAVENOUS | Status: DC | PRN

## 2012-07-17 MED ORDER — INSULIN GLARGINE 100 UNIT/ML ~~LOC~~ SOLN
10.0000 [IU] | Freq: Every day | SUBCUTANEOUS | Status: DC
  Administered 2012-07-17: 10 [IU] via SUBCUTANEOUS
  Filled 2012-07-17 (×2): qty 0.1

## 2012-07-17 MED ORDER — DEXTROSE 50 % IV SOLN: 50.0000 mL | INTRAVENOUS | Status: DC | PRN

## 2012-07-17 MED ORDER — INSULIN ASPART 100 UNIT/ML ~~LOC~~ SOLN
0.0000 [IU] | Freq: Three times a day (TID) | SUBCUTANEOUS | Status: DC
  Administered 2012-07-17 (×3): 7 [IU] via SUBCUTANEOUS
  Administered 2012-07-18: 5 [IU] via SUBCUTANEOUS

## 2012-07-17 NOTE — Progress Notes (Signed)
7/3  May need to increase Novolog correction scale to MODERATE AC & HS if CBGs continue greater than 180 mg/dl.  Harvel Ricks RN BSN CDE

## 2012-07-17 NOTE — Progress Notes (Signed)
7/3  Visited with patient and was able to teach use of the insulin pen.  Patient returned demonstration well.  Suggested to staff RN that he give his own injection for the next dosage of insulin. Patient could watch video #511 on using the insulin pen.  Ordered the insulin pen starter kit.  Outpatient education was entered for [facility] to contact patient for follow up after discharge.  Patient has [employer]. Patient on Lantus and Novolog insulin in the hospital.  Patient feeling better today.  Will continue to follow while in hospital.  Harvel Ricks RN BSN CDE

## 2012-07-17 NOTE — Plan of Care (Signed)
Problem: Food- and Nutrition-Related Knowledge Deficit (NB-1.1) Goal: Nutrition education Formal process to instruct or train a patient/client in a skill or to impart knowledge to help patients/clients voluntarily manage or modify food choices and eating behavior to maintain or improve health. Outcome: Completed/Met Date Met:  07/17/12  RD consulted for nutrition education regarding diabetes.     Lab Results  Component Value Date    HGBA1C 12.7* 07/16/2012    RD provided "Carbohydrate Counting for People with Diabetes" handout from the Academy of [employer] Dietetics. Discussed different food groups and their effects on blood sugar, emphasizing carbohydrate-containing foods. Provided list of carbohydrates and recommended serving sizes of common foods.  Discussed importance of controlled and consistent carbohydrate intake throughout the day. Provided examples of ways to balance meals/snacks and encouraged intake of high-fiber, whole grain complex carbohydrates. Teach back method used.  Expect good compliance.  Initial nutrition assessment completed 7/2.  Patient's appetite poor; amenable to [employer] supplements PRN.  RD to order.  Arthur Holms, RD, LDN Pager #: [phone] After-Hours Pager #: [phone]

## 2012-07-17 NOTE — Progress Notes (Signed)
[facility] Progress Note [place] TEAM 1 - Stepdown/ICU TEAM   Alex Morrison [id number] DOB: 07-21-1970 DOA: 07/15/2012 PCP: No PCP Per Patient  Brief narrative: 42 yo male with no significant past medical history had been experiencing nausea with vomiting for 2-3 days with associated weakness and fatigue. Patient was found to have a perirectal abscess in the week prior to his admit which was incised and drained at an urgent care.  Patient was placed on antibiotics and had followed with Dr. Ninfa Linden (General Surgeon) who had changed antibiotics to doxycycline. Patient otherwise denied diarrhea abdominal pain chest pain shortness of breath headache or blurred vision. In the ER patient was found to have high blood sugar with anion gap and acidosis.  Assessment/Plan:  DKA with newly diagnosed DM - A1c 12.7 - increase Lantus to 20 and start Novolog with meals - start diabetes teaching -outpt diabetes referral -will need PCP on d/c  Perirectal abscess s/p I&D - was on Doxy and or Septra as outpt - currently on Vanc and Levaquin - wound culture from 7/2 negative - will need home health to do dressing changes at home  Code Status: FULL Family Communication: No family present at time of exam Disposition Plan: SDU  Consultants: none  Procedures: none  Antibiotics: Vancomycin 7/2 >> Levaquin 7/2 >>  DVT prophylaxis: lovenox  HPI/Subjective: Pt doing well- have discussed diabetes with him and need for insulin- He admits to poor appetite today   Objective: Blood pressure 138/84, pulse 101, temperature 98.3 F (36.8 C), temperature source Oral, resp. rate 21, height 5' 10.87" (1.8 m), weight 90 kg (198 lb 6.6 oz), SpO2 98.00%.  Intake/Output Summary (Last 24 hours) at 07/17/12 1104 Last data filed at 07/17/12 0800  Gross per 24 hour  Intake   3180 ml  Output   1450 ml  Net   1730 ml    Exam: General: Well-developed well-nourished.  Eyes: Anicteric no  pallor.  ENT: No discharge from the ears eyes nose mouth. Tongue looks coated black.  Neck: No mass.  Cardiovascular: S1-S2 heard.  Respiratory: No rhonchi or crepitations.  Abdomen: Soft nontender bowel sounds present.  Skin: Psoriatic skin lesions. Left buttock has dressing which is stained.  Musculoskeletal: No edema.  Psychiatric: Appears normal.  Neurologic: Alert awake oriented to time place and person. Moves all extremities.   Data Reviewed: Basic Metabolic Panel:  Recent Labs Lab 07/16/12 0400 07/16/12 0835 07/16/12 1417 07/16/12 1942 07/17/12 0020  NA 140 140 142 141 141  K 4.9 4.2 3.8 3.5 3.5  CL 108 110 112 111 111  CO2 7* 12* 16* 18* 19  GLUCOSE 336* 233* 156* 139* 158*  BUN 27* 26* 26* 25* 24*  CREATININE 1.05 1.03 0.97 0.84 0.85  CALCIUM 9.6 9.6 9.8 9.7 9.9   Liver Function Tests:  Recent Labs Lab 07/15/12 2206  AST 9  ALT 9  ALKPHOS 132*  BILITOT 0.3  PROT 7.7  ALBUMIN 3.6    Recent Labs Lab 07/15/12 2354  LIPASE 27   CBC:  Recent Labs Lab 07/15/12 2206 07/16/12 0400 07/17/12 0450  WBC 13.5* 14.6* 10.3  NEUTROABS 10.9* 12.4*  --   HGB 16.2 13.8 12.4*  HCT 45.8 39.0 35.4*  MCV 82.5 81.4 80.5  PLT 280 254 225   Cardiac Enzymes:  Recent Labs Lab 07/16/12 0400  TROPONINI <0.30   CBG:  Recent Labs Lab 07/16/12 2218 07/16/12 2322 07/17/12 0012 07/17/12 0122 07/17/12 0804  GLUCAP 118* 145* 147*  149* 331*    Recent Results (from the past 240 hour(s))  CULTURE, ROUTINE-ABSCESS     Status: None   Collection Time    07/14/12  3:32 PM      Result Value Range Status   Specimen Description ABSCESS BUTTOCKS LEFT   Final   Special Requests NONE   Final   Gram Stain     Final   Value: NO WBC SEEN     NO SQUAMOUS EPITHELIAL CELLS SEEN     RARE GRAM POSITIVE COCCI IN PAIRS   Culture     Final   Value: ABUNDANT GROUP B STREP(S.AGALACTIAE)ISOLATED     Note: TESTING AGAINST S. AGALACTIAE NOT ROUTINELY PERFORMED DUE TO  PREDICTABILITY OF AMP/PEN/VAN SUSCEPTIBILITY.   Report Status 07/17/2012 FINAL   Final  MRSA PCR SCREENING     Status: None   Collection Time    07/16/12  2:53 AM      Result Value Range Status   MRSA by PCR NEGATIVE  NEGATIVE Final   Comment:            The GeneXpert MRSA Assay (FDA     approved for NASAL specimens     only), is one component of a     comprehensive MRSA colonization     surveillance program. It is not     intended to diagnose MRSA     infection nor to guide or     monitor treatment for     MRSA infections.  WOUND CULTURE     Status: None   Collection Time    07/16/12  5:42 AM      Result Value Range Status   Specimen Description WOUND PERIRECTAL   Final   Special Requests NONE   Final   Gram Stain     Final   Value: RARE WBC PRESENT, PREDOMINANTLY PMN     NO SQUAMOUS EPITHELIAL CELLS SEEN     FEW GRAM POSITIVE COCCI     IN PAIRS FEW YEAST   Culture PENDING   Incomplete   Report Status PENDING   Incomplete     Studies:  Recent x-ray studies have been reviewed in detail by the Attending Physician  Scheduled Meds:  Scheduled Meds: . insulin aspart  0-5 Units Subcutaneous QHS  . insulin aspart  0-9 Units Subcutaneous TID WC  . insulin glargine  10 Units Subcutaneous QHS  . levofloxacin (LEVAQUIN) IV  750 mg Intravenous Q24H  . vancomycin  1,000 mg Intravenous Q8H   Continuous Infusions: . sodium chloride 100 mL/hr at 07/17/12 0914  . insulin (NOVOLIN-R) infusion Stopped (07/17/12 0227)    Time spent on care of this patient: 66 mins  Debbe Odea, MD  [facility] Office  [phone] Pager - Text Page per Amion as per below:  On-Call/Text Page:      [email or web address]      password [email or web address]  If 7PM-7AM, please contact night-coverage [email or web address] Password [email or web address] 07/17/2012, 11:04 AM   LOS: 2 days

## 2012-07-17 NOTE — Progress Notes (Signed)
7/3  Off glucostabilizer today at [place].  Given Lantus 10 units.  CBG is 331 mg/dl at 0800.  Recommend increasing Lantus to 20 units daily according to 0.2 units/kg calculation if CBGs continue greater than 180 mg/dl.  Harvel Ricks RN BSN CDE

## 2012-07-17 NOTE — Progress Notes (Signed)
Pt transferred to med-surg bed 6N17 per MD order. Pt alert and oriented at time of transfer. Order received to d/c cardiac monitoring. Report given to [facility] RN Remo Lipps. Pt transferred to 6N17 via wheelchair by nurse tech. All belongings sent with pt. Shanda Bumps

## 2012-07-18 LAB — WOUND CULTURE

## 2012-07-18 LAB — BASIC METABOLIC PANEL
Calcium: 9.5 mg/dL (ref 8.4–10.5)
Chloride: 105 mEq/L (ref 96–112)
Creatinine, Ser: 0.96 mg/dL (ref 0.50–1.35)
GFR calc Af Amer: >90 mL/min (ref >90)

## 2012-07-18 MED ORDER — INSULIN ASPART 100 UNIT/ML ~~LOC~~ SOLN: SUBCUTANEOUS | Status: DC

## 2012-07-18 MED ORDER — HYDROCODONE-ACETAMINOPHEN 5-325 MG PO TABS
1.0000 | ORAL_TABLET | Freq: Four times a day (QID) | ORAL | Status: DC | PRN
  Administered 2012-07-18: 1 via ORAL
  Filled 2012-07-18: qty 1

## 2012-07-18 MED ORDER — POTASSIUM CHLORIDE CRYS ER 20 MEQ PO TBCR
40.0000 meq | EXTENDED_RELEASE_TABLET | Freq: Once | ORAL | Status: AC
  Administered 2012-07-18: 40 meq via ORAL
  Filled 2012-07-18: qty 2

## 2012-07-18 MED ORDER — FREESTYLE SYSTEM KIT: 1.0000 | PACK | Freq: Three times a day (TID) | Status: DC

## 2012-07-18 MED ORDER — INSULIN GLARGINE 100 UNIT/ML ~~LOC~~ SOLN: 25.0000 [IU] | Freq: Every day | SUBCUTANEOUS | Status: DC

## 2012-07-18 NOTE — Progress Notes (Signed)
Patient discharged to home with instructions, verbalized understanding. 

## 2012-07-18 NOTE — Discharge Summary (Signed)
[facility]                                                                                   DMITRY RUTTER, is a 42 y.o. male  DOB [birth year]  MRN [id number].  Admission date:  07/15/2012  Discharge Date:  07/18/2012  Primary MD  No PCP Per Patient  Admitting Physician  Rise Patience, MD  Admission Diagnosis  DKA (diabetic ketoacidosis) [250.10]  Discharge Diagnosis     Principal Problem:   DKA (diabetic ketoacidosis) Active Problems:   Perirectal abscess   DM (diabetes mellitus) type 2, uncontrolled, with ketoacidosis   Past Medical History  Diagnosis Date  . Abscess of buttock     Past Surgical History  Procedure Laterality Date  . Incision and drainage       Recommendations for primary care physician for things to follow:      Discharge Diagnoses:   Principal Problem:   DKA (diabetic ketoacidosis) Active Problems:   Perirectal abscess   DM (diabetes mellitus) type 2, uncontrolled, with ketoacidosis    Discharge Condition: stable   Diet recommendation: See Discharge Instructions below   Consults diabetic coordinator for diabetic education    History of present illness and  Hospital Course:     Kindly see H&P for history of present illness and admission details, please review complete Labs, Consult reports and Test reports for all details in brief HECTOR FOPPIANO, is a 42 y.o. male, patient with previous past medical history who was recently diagnosed with a perirectal abscess and underwent incision and drainage at urgent care and was following Dr. Ninfa Linden general surgeon for the same and was on oral antibiotics, presented to the hospital with few day history of nausea vomiting and generalized fatigue, he was diagnosed with DKA with new diagnosis of diabetes mellitus, his A1c was 12.7, he was initially placed on IV insulin drip and thereafter transitioned to Lantus along with sliding scale insulin.  Patient received diabetic education  for his new diagnosis of diabetes mellitus by gravid is correlate her, he will get insulin prescriptions along with testing supplies, he has been asked to follow with primary care physician and case management has been requested to assist him in finding 1.   His rectal abscess he will continue to follow with Dr. Ninfa Linden, home RN has been requested through case management for daily dressing changes which are wet-to-dry, he will commence his home antibiotics were prescribed to him by his surgeon as before.    His low potassium has been replaced prior to discharge today.    Today   Subjective:   Aws Mallare today has no headache,no chest abdominal pain,no new weakness tingling or numbness, feels much better wants to go home today.    Objective:   Blood pressure 145/84, pulse 92, temperature 98.1 F (36.7 C), temperature source Oral, resp. rate 18, height 5\' 11"  (1.803 m), weight 88.1 kg (194 lb 3.6 oz), SpO2 99.00%.   Intake/Output Summary (Last 24 hours) at 07/18/12 0859 Last data filed at 07/17/12 1400  Gross per 24 hour  Intake    600 ml  Output   1250 ml  Net   -  650 ml    Exam Awake Alert, Oriented *3, No new F.N deficits, Normal affect [place].AT,PERRAL Supple Neck,No JVD, No cervical lymphadenopathy appriciated.  Symmetrical Chest wall movement, Good air movement bilaterally, CTAB RRR,No Gallops,Rubs or new Murmurs, No Parasternal Heave +ve B.Sounds, Abd Soft, Non tender, No organomegaly appriciated, No rebound -guarding or rigidity. No Cyanosis, Clubbing or edema, No new Rash or bruise, Rectal abscess site appears stable.  Data Review   Major procedures and Radiology Reports - PLEASE review detailed and final reports for all details in brief -       Dg Chest [facility] 1 View  07/16/2012   *RADIOLOGY REPORT*  Clinical Data: Vomiting.  PORTABLE CHEST - 1 VIEW  Comparison: None.  Findings: Lungs are clear.  Heart size is normal.  No pneumothorax or pleural effusion.   IMPRESSION: Negative exam.   Original Report Authenticated By: Orlean Patten, M.D.    Micro Results     CBC w Diff: Lab Results  Component Value Date   WBC 10.3 07/17/2012   HGB 12.4* 07/17/2012   HCT 35.4* 07/17/2012   PLT 225 07/17/2012   LYMPHOPCT 11* 07/16/2012   MONOPCT 4 07/16/2012   EOSPCT 0 07/16/2012   BASOPCT 0 07/16/2012    CMP: Lab Results  Component Value Date   NA 138 07/18/2012   K 3.4* 07/18/2012   CL 105 07/18/2012   CO2 19 07/18/2012   BUN 20 07/18/2012   CREATININE 0.96 07/18/2012   PROT 7.7 07/15/2012   ALBUMIN 3.6 07/15/2012   BILITOT 0.3 07/15/2012   ALKPHOS 132* 07/15/2012   AST 9 07/15/2012   ALT 9 07/15/2012  . Lab Results  Component Value Date   HGBA1C 12.7* 07/16/2012     Discharge Instructions     Follow with Primary MD as recommended by case management in 3 days along with a general surgeon as suggested  Get CBC, CMP, checked 3 days by Primary MD and again as instructed by your Primary MD.     Accuchecks 4 times/day, Once in AM empty stomach and then before each meal. Log in all results and show them to your Prim.MD in 3 days. If any glucose reading is under 80 or above 300 call your Prim MD immidiately. Follow Low glucose instructions for glucose under 80 as instructed.   Get Medicines reviewed and adjusted.  Please request your Prim.MD to go over all Hospital Tests and Procedure/Radiological results at the follow up, please get all Hospital records sent to your Prim MD by signing hospital release before you go home.  Activity: As tolerated with Full fall precautions use walker/cane & assistance as needed   Diet:  Heart healthy and low carbohydrate  For Heart failure patients - Check your Weight same time everyday, if you gain over 2 pounds, or you develop in leg swelling, experience more shortness of breath or chest pain, call your Primary MD immediately. Follow Cardiac Low Salt Diet and 1.8 lit/day fluid restriction.  Disposition Home   If you experience  worsening of your admission symptoms, develop shortness of breath, life threatening emergency, suicidal or homicidal thoughts you must seek medical attention immediately by calling 911 or calling your MD immediately  if symptoms less severe.  You Must read complete instructions/literature along with all the possible adverse reactions/side effects for all the Medicines you take and that have been prescribed to you. Take any new Medicines after you have completely understood and accpet all the possible adverse reactions/side effects.  Do not drive and provide baby sitting services if your were admitted for syncope or siezures until you have seen by Primary MD or a Neurologist and advised to do so again.  Do not drive when taking Pain medications.    Do not take more than prescribed Pain, Sleep and Anxiety Medications  Special Instructions: If you have smoked or chewed Tobacco  in the last 2 yrs please stop smoking, stop any regular Alcohol  and or any Recreational drug use.  Wear Seat belts while driving.   Please note  You were cared for by a hospitalist during your hospital stay. If you have any questions about your discharge medications or the care you received while you were in the hospital after you are discharged, you can call the unit and asked to speak with the hospitalist on call if the hospitalist that took care of you is not available. Once you are discharged, your primary care physician will handle any further medical issues. Please note that NO REFILLS for any discharge medications will be authorized once you are discharged, as it is imperative that you return to your primary care physician (or establish a relationship with a primary care physician if you do not have one) for your aftercare needs so that they can reassess your need for medications and monitor your lab values.    Follow-up Information   Follow up with PCP on nearest urgent care. Schedule an appointment as soon as  possible for a visit in 3 days.      Follow up with [facility], MD. Schedule an appointment as soon as possible for a visit in 3 days.   Contact information:   [address] [place] [address] [phone]         Discharge Medications     Medication List         doxycycline 100 MG tablet  Commonly known as:  VIBRA-TABS  Take 1 tablet (100 mg total) by mouth 2 (two) times daily.     glucose monitoring kit monitoring kit  1 each by Does not apply route 4 (four) times daily - after meals and at bedtime. 1 month Diabetic Testing Supplies for QAC-QHS accuchecks.     HYDROcodone-acetaminophen 5-325 MG per tablet  Commonly known as:  NORCO  Take 1-2 tablets by mouth every 4 (four) hours as needed for pain.     ibuprofen 600 MG tablet  Commonly known as:  ADVIL,MOTRIN  Take 1 tablet (600 mg total) by mouth every 8 (eight) hours as needed for pain.     insulin aspart 100 UNIT/ML injection  Commonly known as:  novoLOG  Before each meal 3 times a day, 140-199 - 2 units, 200-250 - 4 units, 251-299 - 6 units,  300-349 - 8 units,  350 or above 10 units. Insulin PEN if approved, provide syringes and needles if needed.     insulin glargine 100 UNIT/ML injection  Commonly known as:  LANTUS  Inject 0.25 mLs (25 Units total) into the skin at bedtime. Can dispense insulin pen if approved     sulfamethoxazole-trimethoprim 800-160 MG per tablet  Commonly known as:  SEPTRA DS  Take 1 tablet by mouth every 12 (twelve) hours.     traMADol 50 MG tablet  Commonly known as:  ULTRAM  Take 1 tablet (50 mg total) by mouth every 6 (six) hours as needed for pain.           Total Time in  preparing paper work, data evaluation and todays exam - 35 minutes  Thurnell Lose M.D on 07/18/2012 at [place]  [phone]

## 2012-07-18 NOTE — Progress Notes (Signed)
Spoke with patient about HH needs, diabetic education and obtaining primary care.  Pt chose [facility] for [facility] to assist both with dressing change and diabetic education.  Pt has [employer] coverage and can establish with any network primary care physician that is accepting new patients. Discussed with him about the urgent care offices in town such as [facility] on [address] where pt has previously been seen about establishing primary care there.  He was accepting of that as an option.

## 2012-07-21 ENCOUNTER — Encounter (INDEPENDENT_AMBULATORY_CARE_PROVIDER_SITE_OTHER): Payer: Self-pay | Admitting: General Surgery

## 2012-07-21 ENCOUNTER — Ambulatory Visit (INDEPENDENT_AMBULATORY_CARE_PROVIDER_SITE_OTHER): Payer: BC Managed Care – PPO | Admitting: General Surgery

## 2012-07-21 VITALS — BP 104/60 | HR 96 | Temp 98.8°F | Resp 16 | Ht 71.0 in | Wt 207.0 lb

## 2012-07-21 DIAGNOSIS — K612 Anorectal abscess: Secondary | ICD-10-CM

## 2012-07-21 DIAGNOSIS — E1110 Type 2 diabetes mellitus with ketoacidosis without coma: Secondary | ICD-10-CM

## 2012-07-21 DIAGNOSIS — K611 Rectal abscess: Secondary | ICD-10-CM

## 2012-07-21 NOTE — Patient Instructions (Addendum)
The 2 open infected areas in your left perirectal area actually looked like they are fairly well drained, but there is still infected fluid in them.  You have been given new antibiotics, Cipro and Flagyl, and it is very important that you start taking these antibiotics today and continue them until they are all gone.  Take a warm tub bath 2 or 3 times a day. This will help the infection drain out better.  Expect some drainage, wear a pad.  Because you are also constipated, I recommended he take MiraLAX, one dose every 8 hours until he began having bowel movements.  You will given an appointment to return to see Dr. Ninfa Linden the near future to make sure the infection is coming under control.  If you are not clearly feeling better tomorrow, please give [place] a call.

## 2012-07-21 NOTE — Progress Notes (Signed)
Patient ID: Alex Morrison, male   DOB: [birth year], 42 y.o.   MRN: [name] History: This is a 42 year old man in presented to our office on June 30 with what sounds like an incompletely drained perirectal abscess on the left side. Dr. Ninfa Linden saw him and opened this up and drained a large abscess cavity. He was given a prescription for doxycycline. The patient was admitted On July 1 with DKA. He was discharged home on July 4. He returned to the office today for evaluation. He states that his perianal area feels about the same. He does not have a primary care physician, he was referred to urgent medical care on [address]  to followup regarding his diabetes. He says he's taking his insulin and doesn't need HHN because his father is a diabetic.  Exam: Patient is alert. Somewhat angry and frustrated. Looks a little unsteady on his feet. A little bit diaphoretic. Pressure 104/60. Pulse 96. Temp 98.8. Neurologic. Alert and oriented. Mental status normal. Abdomen: Soft and nontender Rectal there are 2 open wounds in the perianal area, one left lateral and one  left posterior. These are draining a little but of purulent fluid. There appears to be resolving cellulitis. I probed both of these. They did not track toward the rectum. The rectal sphincter looks normal. This was painful. Wound was dressed.  Assessment perirectal abscess, left lateral. By physical exam, this appears to be adequately drained. Recent hospitalization for DKA, new diagnosis of diabetes. No established  primary care physician. Seen by CM/SW in hospital.   Plan: I changed his antibiotics from doxycycline to Cipro and Flagyl and I gave him a 14 day supply. Recommend warm tub baths 3 times a day He will return to see Dr. Ninfa Linden in one or 2 days He is constipated so I told her to take MiraLAX 2 or 3 times a day until he got his bowels are moving I stressed to him that it was very important to establish himself with a primary care  physician immediately, and that he could not do that and he felt worse tomorrow to call office. He may need to go back to the emergency room if his diabetes is back out of control.    Edsel Petrin. Dalbert Batman, M.D., [facility] General and Minimally invasive Surgery Breast and Colorectal Surgery Office:   [phone] Pager:   [phone]

## 2012-07-22 ENCOUNTER — Encounter (INDEPENDENT_AMBULATORY_CARE_PROVIDER_SITE_OTHER): Payer: BC Managed Care – PPO | Admitting: Surgery

## 2012-08-03 NOTE — ED Notes (Addendum)
Abscess culture L buttocks: Abundant Group B strep (S. Agalactiae). No sensitivity due to the predictability of PCN.  Pt. treated with Septra DS on 6/25 and Doxycycline on 6/30. Message sent to Dr. Gunnar Bulla. Alex Morrison 08/03/2012 7/23  Dr. Milinda Antis wrote no further action needed. Pt. Was admitted and they have access to all labs.

## 2012-08-04 ENCOUNTER — Encounter (INDEPENDENT_AMBULATORY_CARE_PROVIDER_SITE_OTHER): Payer: Self-pay | Admitting: Surgery

## 2012-08-06 ENCOUNTER — Telehealth (HOSPITAL_COMMUNITY): Payer: Self-pay | Admitting: *Deleted

## 2012-08-07 ENCOUNTER — Other Ambulatory Visit: Payer: Self-pay | Admitting: Sports Medicine

## 2012-08-07 ENCOUNTER — Ambulatory Visit
Admission: RE | Admit: 2012-08-07 | Discharge: 2012-08-07 | Disposition: A | Discharge status: Home / Self Care | Payer: BC Managed Care – PPO | Source: Ambulatory Visit | Attending: Sports Medicine | Admitting: Sports Medicine

## 2012-08-07 DIAGNOSIS — R52 Pain, unspecified: Secondary | ICD-10-CM

## 2012-08-07 DIAGNOSIS — R609 Edema, unspecified: Secondary | ICD-10-CM

## 2012-09-01 ENCOUNTER — Ambulatory Visit (INDEPENDENT_AMBULATORY_CARE_PROVIDER_SITE_OTHER): Payer: BC Managed Care – PPO | Admitting: Surgery

## 2012-09-01 ENCOUNTER — Encounter (INDEPENDENT_AMBULATORY_CARE_PROVIDER_SITE_OTHER): Payer: Self-pay | Admitting: Surgery

## 2012-09-01 VITALS — BP 142/96 | HR 84 | Resp 16 | Ht 71.0 in | Wt 223.8 lb

## 2012-09-01 DIAGNOSIS — K612 Anorectal abscess: Secondary | ICD-10-CM

## 2012-09-01 DIAGNOSIS — K611 Rectal abscess: Secondary | ICD-10-CM

## 2012-09-01 NOTE — Progress Notes (Signed)
Subjective:     Patient ID: Alex Morrison, male   DOB: 02-04-1970, 42 y.o.   MRN: [name]  HPI She is here for a followup visit office. Rectal abscess. It was drained on June 30. He has finished his antibiotics and is now on better diabetic control and has a primary care physician  Review of Systems     Objective:   Physical Exam On exam, his wound is healing very well with no drainage or evidence of infection    Assessment:     Healing perirectal abscess     Plan:     He will do just local wound care. He will call should he develop any problems. We will see him as needed

## 2013-01-21 ENCOUNTER — Encounter (INDEPENDENT_AMBULATORY_CARE_PROVIDER_SITE_OTHER): Payer: BC Managed Care – PPO | Admitting: Ophthalmology

## 2013-01-21 DIAGNOSIS — E1139 Type 2 diabetes mellitus with other diabetic ophthalmic complication: Secondary | ICD-10-CM

## 2013-01-21 DIAGNOSIS — E1165 Type 2 diabetes mellitus with hyperglycemia: Secondary | ICD-10-CM

## 2013-01-21 DIAGNOSIS — H2510 Age-related nuclear cataract, unspecified eye: Secondary | ICD-10-CM

## 2013-01-21 DIAGNOSIS — E11319 Type 2 diabetes mellitus with unspecified diabetic retinopathy without macular edema: Secondary | ICD-10-CM

## 2013-01-21 DIAGNOSIS — H43819 Vitreous degeneration, unspecified eye: Secondary | ICD-10-CM

## 2013-01-21 DIAGNOSIS — E11311 Type 2 diabetes mellitus with unspecified diabetic retinopathy with macular edema: Secondary | ICD-10-CM

## 2013-02-18 ENCOUNTER — Encounter (INDEPENDENT_AMBULATORY_CARE_PROVIDER_SITE_OTHER): Payer: BC Managed Care – PPO | Admitting: Ophthalmology

## 2013-02-18 DIAGNOSIS — E11319 Type 2 diabetes mellitus with unspecified diabetic retinopathy without macular edema: Secondary | ICD-10-CM

## 2013-02-18 DIAGNOSIS — E1139 Type 2 diabetes mellitus with other diabetic ophthalmic complication: Secondary | ICD-10-CM

## 2013-02-18 DIAGNOSIS — H43819 Vitreous degeneration, unspecified eye: Secondary | ICD-10-CM

## 2013-02-18 DIAGNOSIS — E11311 Type 2 diabetes mellitus with unspecified diabetic retinopathy with macular edema: Secondary | ICD-10-CM

## 2013-02-18 DIAGNOSIS — E1165 Type 2 diabetes mellitus with hyperglycemia: Secondary | ICD-10-CM

## 2013-03-18 ENCOUNTER — Encounter (INDEPENDENT_AMBULATORY_CARE_PROVIDER_SITE_OTHER): Payer: BC Managed Care – PPO | Admitting: Ophthalmology

## 2013-03-18 DIAGNOSIS — E11311 Type 2 diabetes mellitus with unspecified diabetic retinopathy with macular edema: Secondary | ICD-10-CM

## 2013-03-18 DIAGNOSIS — E11319 Type 2 diabetes mellitus with unspecified diabetic retinopathy without macular edema: Secondary | ICD-10-CM

## 2013-03-18 DIAGNOSIS — H43819 Vitreous degeneration, unspecified eye: Secondary | ICD-10-CM

## 2013-03-18 DIAGNOSIS — E1165 Type 2 diabetes mellitus with hyperglycemia: Secondary | ICD-10-CM

## 2013-03-18 DIAGNOSIS — E1139 Type 2 diabetes mellitus with other diabetic ophthalmic complication: Secondary | ICD-10-CM

## 2013-03-18 DIAGNOSIS — H2510 Age-related nuclear cataract, unspecified eye: Secondary | ICD-10-CM

## 2013-03-26 ENCOUNTER — Other Ambulatory Visit (INDEPENDENT_AMBULATORY_CARE_PROVIDER_SITE_OTHER): Payer: BC Managed Care – PPO | Admitting: Ophthalmology

## 2013-03-26 DIAGNOSIS — H3581 Retinal edema: Secondary | ICD-10-CM

## 2013-03-26 DIAGNOSIS — E1165 Type 2 diabetes mellitus with hyperglycemia: Secondary | ICD-10-CM

## 2013-03-26 DIAGNOSIS — E1139 Type 2 diabetes mellitus with other diabetic ophthalmic complication: Secondary | ICD-10-CM

## 2013-04-06 ENCOUNTER — Ambulatory Visit (INDEPENDENT_AMBULATORY_CARE_PROVIDER_SITE_OTHER): Payer: BC Managed Care – PPO | Admitting: Ophthalmology

## 2013-04-06 DIAGNOSIS — H3581 Retinal edema: Secondary | ICD-10-CM

## 2013-08-07 ENCOUNTER — Ambulatory Visit (INDEPENDENT_AMBULATORY_CARE_PROVIDER_SITE_OTHER): Payer: BC Managed Care – PPO | Admitting: Ophthalmology

## 2013-09-27 ENCOUNTER — Inpatient Hospital Stay (HOSPITAL_COMMUNITY)
Admission: EM | Admit: 2013-09-27 | Discharge: 2013-10-05 | DRG: 329 | Disposition: A | Discharge status: Home / Self Care | Payer: BC Managed Care – PPO | Attending: Surgery | Admitting: Surgery

## 2013-09-27 ENCOUNTER — Encounter (HOSPITAL_COMMUNITY): Payer: BC Managed Care – PPO | Admitting: Anesthesiology

## 2013-09-27 ENCOUNTER — Emergency Department (HOSPITAL_COMMUNITY): Payer: BC Managed Care – PPO

## 2013-09-27 ENCOUNTER — Inpatient Hospital Stay (HOSPITAL_COMMUNITY): Payer: BC Managed Care – PPO | Admitting: Anesthesiology

## 2013-09-27 ENCOUNTER — Encounter (HOSPITAL_COMMUNITY): Admission: EM | Disposition: A | Discharge status: Home / Self Care | Payer: Self-pay | Source: Home / Self Care

## 2013-09-27 ENCOUNTER — Encounter (HOSPITAL_COMMUNITY): Payer: Self-pay | Admitting: Emergency Medicine

## 2013-09-27 DIAGNOSIS — N170 Acute kidney failure with tubular necrosis: Secondary | ICD-10-CM | POA: Diagnosis not present

## 2013-09-27 DIAGNOSIS — D62 Acute posthemorrhagic anemia: Secondary | ICD-10-CM | POA: Diagnosis present

## 2013-09-27 DIAGNOSIS — Z794 Long term (current) use of insulin: Secondary | ICD-10-CM | POA: Diagnosis not present

## 2013-09-27 DIAGNOSIS — Z833 Family history of diabetes mellitus: Secondary | ICD-10-CM | POA: Diagnosis not present

## 2013-09-27 DIAGNOSIS — K631 Perforation of intestine (nontraumatic): Secondary | ICD-10-CM | POA: Diagnosis not present

## 2013-09-27 DIAGNOSIS — L408 Other psoriasis: Secondary | ICD-10-CM | POA: Diagnosis present

## 2013-09-27 DIAGNOSIS — K560 Paralytic ileus: Secondary | ICD-10-CM | POA: Diagnosis not present

## 2013-09-27 DIAGNOSIS — K3532 Acute appendicitis with perforation and localized peritonitis, without abscess: Secondary | ICD-10-CM | POA: Diagnosis present

## 2013-09-27 DIAGNOSIS — E870 Hyperosmolality and hypernatremia: Secondary | ICD-10-CM | POA: Diagnosis present

## 2013-09-27 DIAGNOSIS — I10 Essential (primary) hypertension: Secondary | ICD-10-CM | POA: Diagnosis present

## 2013-09-27 DIAGNOSIS — Z79899 Other long term (current) drug therapy: Secondary | ICD-10-CM

## 2013-09-27 DIAGNOSIS — K35209 Acute appendicitis with generalized peritonitis, without abscess, unspecified as to perforation: Principal | ICD-10-CM | POA: Diagnosis present

## 2013-09-27 DIAGNOSIS — N179 Acute kidney failure, unspecified: Secondary | ICD-10-CM | POA: Diagnosis present

## 2013-09-27 DIAGNOSIS — E1069 Type 1 diabetes mellitus with other specified complication: Secondary | ICD-10-CM | POA: Diagnosis present

## 2013-09-27 DIAGNOSIS — K352 Acute appendicitis with generalized peritonitis, without abscess: Principal | ICD-10-CM | POA: Diagnosis present

## 2013-09-27 DIAGNOSIS — L409 Psoriasis, unspecified: Secondary | ICD-10-CM | POA: Diagnosis present

## 2013-09-27 DIAGNOSIS — Z5331 Laparoscopic surgical procedure converted to open procedure: Secondary | ICD-10-CM

## 2013-09-27 DIAGNOSIS — E109 Type 1 diabetes mellitus without complications: Secondary | ICD-10-CM | POA: Diagnosis present

## 2013-09-27 HISTORY — PX: OTHER SURGICAL HISTORY: SHX169

## 2013-09-27 HISTORY — DX: Acute kidney failure, unspecified: N17.9

## 2013-09-27 HISTORY — PX: LAPAROSCOPIC ABDOMINAL EXPLORATION: SHX6249

## 2013-09-27 HISTORY — PX: LAPAROSCOPY: SHX197

## 2013-09-27 HISTORY — DX: Acute appendicitis with perforation, localized peritonitis, and gangrene, without abscess: K35.32

## 2013-09-27 LAB — COMPREHENSIVE METABOLIC PANEL
ALBUMIN: 3.9 g/dL (ref 3.5–5.2)
ALK PHOS: 106 U/L (ref 39–117)
ALT: 29 U/L (ref 0–53)
ANION GAP: 17 — AB (ref 5–15)
AST: 22 U/L (ref 0–37)
BILIRUBIN TOTAL: 0.9 mg/dL (ref 0.3–1.2)
BUN: 34 mg/dL — AB (ref 6–23)
CHLORIDE: 102 meq/L (ref 96–112)
CO2: 22 meq/L (ref 19–32)
CREATININE: 1.51 mg/dL — AB (ref 0.50–1.35)
Calcium: 9.4 mg/dL (ref 8.4–10.5)
GFR calc Af Amer: 63 mL/min — ABNORMAL LOW (ref >90)
GFR, EST NON AFRICAN AMERICAN: 55 mL/min — AB (ref >90)
Glucose, Bld: 243 mg/dL — ABNORMAL HIGH (ref 70–99)
POTASSIUM: 4.9 meq/L (ref 3.7–5.3)
Sodium: 141 mEq/L (ref 137–147)
Total Protein: 6.9 g/dL (ref 6.0–8.3)

## 2013-09-27 LAB — CBC WITH DIFFERENTIAL/PLATELET
BASOS PCT: 0 % (ref 0–1)
Basophils Absolute: 0 10*3/uL (ref 0.0–0.1)
Eosinophils Absolute: 0 10*3/uL (ref 0.0–0.7)
Eosinophils Relative: 0 % (ref 0–5)
HEMATOCRIT: 39.4 % (ref 39.0–52.0)
HEMOGLOBIN: 13.7 g/dL (ref 13.0–17.0)
LYMPHS ABS: 1.2 10*3/uL (ref 0.7–4.0)
LYMPHS PCT: 17 % (ref 12–46)
MCH: 28.8 pg (ref 26.0–34.0)
MCHC: 34.8 g/dL (ref 30.0–36.0)
MCV: 82.9 fL (ref 78.0–100.0)
MONO ABS: 0.3 10*3/uL (ref 0.1–1.0)
MONOS PCT: 5 % (ref 3–12)
NEUTROS ABS: 5.3 10*3/uL (ref 1.7–7.7)
NEUTROS PCT: 78 % — AB (ref 43–77)
Platelets: 193 10*3/uL (ref 150–400)
RBC: 4.75 MIL/uL (ref 4.22–5.81)
RDW: 13.2 % (ref 11.5–15.5)
WBC: 6.8 10*3/uL (ref 4.0–10.5)

## 2013-09-27 LAB — CBG MONITORING, ED: Glucose-Capillary: 224 mg/dL — ABNORMAL HIGH (ref 70–99)

## 2013-09-27 LAB — GLUCOSE, CAPILLARY
Glucose-Capillary: 218 mg/dL — ABNORMAL HIGH (ref 70–99)
Glucose-Capillary: 247 mg/dL — ABNORMAL HIGH (ref 70–99)

## 2013-09-27 LAB — MRSA PCR SCREENING: MRSA BY PCR: NEGATIVE

## 2013-09-27 LAB — I-STAT CG4 LACTIC ACID, ED: Lactic Acid, Venous: 1.95 mmol/L (ref 0.5–2.2)

## 2013-09-27 LAB — LIPASE, BLOOD: LIPASE: 8 U/L — AB (ref 11–59)

## 2013-09-27 SURGERY — LAPAROSCOPY, DIAGNOSTIC
Anesthesia: General | Site: Abdomen

## 2013-09-27 MED ORDER — LIDOCAINE HCL (CARDIAC) 20 MG/ML IV SOLN
INTRAVENOUS | Status: DC | PRN
  Administered 2013-09-27: 60 mg via INTRAVENOUS

## 2013-09-27 MED ORDER — FENTANYL CITRATE 0.05 MG/ML IJ SOLN
INTRAMUSCULAR | Status: AC
Start: 2013-09-27 — End: 2013-09-27
  Filled 2013-09-27: qty 5

## 2013-09-27 MED ORDER — HYDROMORPHONE HCL PF 1 MG/ML IJ SOLN
1.0000 mg | INTRAMUSCULAR | Status: DC | PRN
  Administered 2013-09-27: 1 mg via INTRAVENOUS
  Filled 2013-09-27: qty 1

## 2013-09-27 MED ORDER — INSULIN ASPART 100 UNIT/ML ~~LOC~~ SOLN: 0.0000 [IU] | Freq: Three times a day (TID) | SUBCUTANEOUS | Status: DC

## 2013-09-27 MED ORDER — PROPOFOL 10 MG/ML IV BOLUS
INTRAVENOUS | Status: DC | PRN
  Administered 2013-09-27: 30 mg via INTRAVENOUS
  Administered 2013-09-27: 170 mg via INTRAVENOUS

## 2013-09-27 MED ORDER — ONDANSETRON HCL 4 MG/2ML IJ SOLN
INTRAMUSCULAR | Status: DC | PRN
  Administered 2013-09-27: 4 mg via INTRAVENOUS

## 2013-09-27 MED ORDER — SODIUM CHLORIDE 0.9 % IV SOLN
INTRAVENOUS | Status: DC | PRN
  Administered 2013-09-27 (×3): via INTRAVENOUS

## 2013-09-27 MED ORDER — HYDROMORPHONE HCL PF 1 MG/ML IJ SOLN
1.0000 mg | Freq: Once | INTRAMUSCULAR | Status: AC
  Administered 2013-09-27: 1 mg via INTRAVENOUS
  Filled 2013-09-27: qty 1

## 2013-09-27 MED ORDER — HYDROMORPHONE 0.3 MG/ML IV SOLN
INTRAVENOUS | Status: AC
  Filled 2013-09-27: qty 25

## 2013-09-27 MED ORDER — LACTATED RINGERS IV SOLN
INTRAVENOUS | Status: DC | PRN
  Administered 2013-09-27 (×2): via INTRAVENOUS

## 2013-09-27 MED ORDER — SODIUM CHLORIDE 0.9 % IJ SOLN
9.0000 mL | INTRAMUSCULAR | Status: DC | PRN
Start: 2013-09-27 — End: 2013-09-28

## 2013-09-27 MED ORDER — PIPERACILLIN-TAZOBACTAM 3.375 G IVPB
3.3750 g | Freq: Three times a day (TID) | INTRAVENOUS | Status: DC
  Administered 2013-09-27 – 2013-10-05 (×22): 3.375 g via INTRAVENOUS
  Filled 2013-09-27 (×27): qty 50

## 2013-09-27 MED ORDER — NEOSTIGMINE METHYLSULFATE 10 MG/10ML IV SOLN
INTRAVENOUS | Status: AC
  Filled 2013-09-27: qty 1

## 2013-09-27 MED ORDER — MIDAZOLAM HCL 2 MG/2ML IJ SOLN
INTRAMUSCULAR | Status: AC
  Filled 2013-09-27: qty 2

## 2013-09-27 MED ORDER — PHENYLEPHRINE HCL 10 MG/ML IJ SOLN
INTRAMUSCULAR | Status: DC | PRN
  Administered 2013-09-27 (×2): 80 ug via INTRAVENOUS
  Administered 2013-09-27: 120 ug via INTRAVENOUS

## 2013-09-27 MED ORDER — SODIUM CHLORIDE 0.9 % IV SOLN
Freq: Once | INTRAVENOUS | Status: AC
  Administered 2013-09-27: 13:00:00 via INTRAVENOUS

## 2013-09-27 MED ORDER — GLYCOPYRROLATE 0.2 MG/ML IJ SOLN
INTRAMUSCULAR | Status: DC | PRN
  Administered 2013-09-27: 0.4 mg via INTRAVENOUS

## 2013-09-27 MED ORDER — VECURONIUM BROMIDE 10 MG IV SOLR
INTRAVENOUS | Status: DC | PRN
  Administered 2013-09-27: 2 mg via INTRAVENOUS

## 2013-09-27 MED ORDER — ONDANSETRON HCL 4 MG/2ML IJ SOLN: 4.0000 mg | Freq: Four times a day (QID) | INTRAMUSCULAR | Status: DC | PRN

## 2013-09-27 MED ORDER — SODIUM CHLORIDE 0.9 % IV SOLN
1000.0000 mL | Freq: Once | INTRAVENOUS | Status: AC
  Administered 2013-09-27: 1000 mL via INTRAVENOUS

## 2013-09-27 MED ORDER — NEOSTIGMINE METHYLSULFATE 10 MG/10ML IV SOLN
INTRAVENOUS | Status: DC | PRN
  Administered 2013-09-27: 2 mg via INTRAVENOUS

## 2013-09-27 MED ORDER — SODIUM CHLORIDE 0.9 % IV BOLUS (SEPSIS)
1000.0000 mL | Freq: Once | INTRAVENOUS | Status: AC
  Administered 2013-09-27: 1000 mL via INTRAVENOUS

## 2013-09-27 MED ORDER — OXYCODONE HCL 5 MG/5ML PO SOLN: 5.0000 mg | Freq: Once | ORAL | Status: DC | PRN

## 2013-09-27 MED ORDER — FENTANYL CITRATE 0.05 MG/ML IJ SOLN
50.0000 ug | Freq: Once | INTRAMUSCULAR | Status: AC
  Administered 2013-09-27: 50 ug via INTRAVENOUS
  Filled 2013-09-27: qty 2

## 2013-09-27 MED ORDER — SODIUM CHLORIDE 0.9 % IV SOLN
INTRAVENOUS | Status: DC
  Administered 2013-09-27 – 2013-09-29 (×4): via INTRAVENOUS

## 2013-09-27 MED ORDER — INSULIN ASPART 100 UNIT/ML ~~LOC~~ SOLN
0.0000 [IU] | SUBCUTANEOUS | Status: DC
  Administered 2013-09-27 – 2013-09-28 (×2): 5 [IU] via SUBCUTANEOUS
  Administered 2013-09-28 (×2): 3 [IU] via SUBCUTANEOUS
  Administered 2013-09-28: 5 [IU] via SUBCUTANEOUS
  Administered 2013-09-28 – 2013-09-29 (×3): 3 [IU] via SUBCUTANEOUS
  Administered 2013-09-29: 5 [IU] via SUBCUTANEOUS
  Administered 2013-09-29: 3 [IU] via SUBCUTANEOUS
  Administered 2013-09-29: 5 [IU] via SUBCUTANEOUS
  Administered 2013-09-29 – 2013-09-30 (×3): 3 [IU] via SUBCUTANEOUS
  Administered 2013-09-30: 2 [IU] via SUBCUTANEOUS
  Administered 2013-09-30: 3 [IU] via SUBCUTANEOUS
  Administered 2013-09-30: 2 [IU] via SUBCUTANEOUS
  Administered 2013-09-30 – 2013-10-01 (×6): 3 [IU] via SUBCUTANEOUS
  Administered 2013-10-01: 2 [IU] via SUBCUTANEOUS
  Administered 2013-10-01 – 2013-10-02 (×2): 3 [IU] via SUBCUTANEOUS
  Administered 2013-10-02: 2 [IU] via SUBCUTANEOUS
  Administered 2013-10-02 (×3): 3 [IU] via SUBCUTANEOUS
  Administered 2013-10-02 – 2013-10-03 (×2): 2 [IU] via SUBCUTANEOUS
  Administered 2013-10-03: 5 [IU] via SUBCUTANEOUS
  Administered 2013-10-03: 2 [IU] via SUBCUTANEOUS
  Administered 2013-10-03 – 2013-10-04 (×6): 3 [IU] via SUBCUTANEOUS
  Administered 2013-10-04: 5 [IU] via SUBCUTANEOUS

## 2013-09-27 MED ORDER — IOHEXOL 300 MG/ML  SOLN
25.0000 mL | INTRAMUSCULAR | Status: DC | PRN
  Administered 2013-09-27: 25 mL via ORAL

## 2013-09-27 MED ORDER — HYDROMORPHONE HCL PF 1 MG/ML IJ SOLN: 0.2500 mg | INTRAMUSCULAR | Status: DC | PRN

## 2013-09-27 MED ORDER — SUCCINYLCHOLINE CHLORIDE 20 MG/ML IJ SOLN
INTRAMUSCULAR | Status: DC | PRN
  Administered 2013-09-27: 100 mg via INTRAVENOUS

## 2013-09-27 MED ORDER — PHENYLEPHRINE 40 MCG/ML (10ML) SYRINGE FOR IV PUSH (FOR BLOOD PRESSURE SUPPORT)
PREFILLED_SYRINGE | INTRAVENOUS | Status: AC
  Filled 2013-09-27: qty 10

## 2013-09-27 MED ORDER — DIPHENHYDRAMINE HCL 12.5 MG/5ML PO ELIX
12.5000 mg | ORAL_SOLUTION | Freq: Four times a day (QID) | ORAL | Status: DC | PRN
  Filled 2013-09-27: qty 5

## 2013-09-27 MED ORDER — PIPERACILLIN-TAZOBACTAM 3.375 G IVPB 30 MIN
3.3750 g | Freq: Once | INTRAVENOUS | Status: AC
  Administered 2013-09-27: 3.375 g via INTRAVENOUS
  Filled 2013-09-27: qty 50

## 2013-09-27 MED ORDER — ONDANSETRON HCL 4 MG/2ML IJ SOLN
INTRAMUSCULAR | Status: AC
  Filled 2013-09-27: qty 2

## 2013-09-27 MED ORDER — GLYCOPYRROLATE 0.2 MG/ML IJ SOLN
INTRAMUSCULAR | Status: AC
  Filled 2013-09-27: qty 2

## 2013-09-27 MED ORDER — MIDAZOLAM HCL 5 MG/5ML IJ SOLN
INTRAMUSCULAR | Status: DC | PRN
  Administered 2013-09-27: 2 mg via INTRAVENOUS

## 2013-09-27 MED ORDER — ROCURONIUM BROMIDE 100 MG/10ML IV SOLN
INTRAVENOUS | Status: DC | PRN
  Administered 2013-09-27 (×3): 10 mg via INTRAVENOUS
  Administered 2013-09-27: 20 mg via INTRAVENOUS

## 2013-09-27 MED ORDER — DIPHENHYDRAMINE HCL 50 MG/ML IJ SOLN
12.5000 mg | Freq: Four times a day (QID) | INTRAMUSCULAR | Status: DC | PRN
Start: 2013-09-27 — End: 2013-09-28

## 2013-09-27 MED ORDER — ACETAMINOPHEN 325 MG PO TABS: 650.0000 mg | ORAL_TABLET | Freq: Four times a day (QID) | ORAL | Status: DC | PRN

## 2013-09-27 MED ORDER — ENOXAPARIN SODIUM 40 MG/0.4ML ~~LOC~~ SOLN
40.0000 mg | SUBCUTANEOUS | Status: DC
  Administered 2013-09-27 – 2013-10-04 (×8): 40 mg via SUBCUTANEOUS
  Filled 2013-09-27 (×13): qty 0.4

## 2013-09-27 MED ORDER — IOHEXOL 300 MG/ML  SOLN
100.0000 mL | Freq: Once | INTRAMUSCULAR | Status: AC | PRN
  Administered 2013-09-27: 100 mL via INTRAVENOUS

## 2013-09-27 MED ORDER — FENTANYL CITRATE 0.05 MG/ML IJ SOLN
INTRAMUSCULAR | Status: DC | PRN
  Administered 2013-09-27 (×2): 150 ug via INTRAVENOUS
  Administered 2013-09-27: 100 ug via INTRAVENOUS
  Administered 2013-09-27 (×2): 50 ug via INTRAVENOUS

## 2013-09-27 MED ORDER — BUPIVACAINE-EPINEPHRINE (PF) 0.25% -1:200000 IJ SOLN
INTRAMUSCULAR | Status: AC
  Filled 2013-09-27: qty 30

## 2013-09-27 MED ORDER — ONDANSETRON HCL 4 MG/2ML IJ SOLN
4.0000 mg | Freq: Once | INTRAMUSCULAR | Status: AC
  Administered 2013-09-27: 4 mg via INTRAVENOUS
  Filled 2013-09-27: qty 2

## 2013-09-27 MED ORDER — FENTANYL CITRATE 0.05 MG/ML IJ SOLN
INTRAMUSCULAR | Status: AC
  Filled 2013-09-27: qty 5

## 2013-09-27 MED ORDER — SODIUM CHLORIDE 0.9 % IV SOLN: Freq: Once | INTRAVENOUS | Status: DC

## 2013-09-27 MED ORDER — HYDROMORPHONE 0.3 MG/ML IV SOLN
INTRAVENOUS | Status: DC
  Administered 2013-09-27: 18:00:00 via INTRAVENOUS
  Administered 2013-09-28: 0.9 mg via INTRAVENOUS
  Administered 2013-09-28: 0.6 mg via INTRAVENOUS

## 2013-09-27 MED ORDER — VECURONIUM BROMIDE 10 MG IV SOLR
INTRAVENOUS | Status: AC
  Filled 2013-09-27: qty 10

## 2013-09-27 MED ORDER — OXYCODONE HCL 5 MG PO TABS: 5.0000 mg | ORAL_TABLET | Freq: Once | ORAL | Status: DC | PRN

## 2013-09-27 MED ORDER — NALOXONE HCL 0.4 MG/ML IJ SOLN
0.4000 mg | INTRAMUSCULAR | Status: DC | PRN
Start: 2013-09-27 — End: 2013-09-28

## 2013-09-27 MED ORDER — BUPIVACAINE-EPINEPHRINE 0.25% -1:200000 IJ SOLN
INTRAMUSCULAR | Status: DC | PRN
  Administered 2013-09-27: 10 mL

## 2013-09-27 SURGICAL SUPPLY — 60 items
ADH SKN CLS APL DERMABOND .7 (GAUZE/BANDAGES/DRESSINGS) ×2
BLADE SURG ROTATE 9660 (MISCELLANEOUS) IMPLANT
BNDG GAUZE ELAST 4 BULKY (GAUZE/BANDAGES/DRESSINGS) ×2 IMPLANT
CANISTER SUCTION 2500CC (MISCELLANEOUS) IMPLANT
CHLORAPREP W/TINT 26ML (MISCELLANEOUS) ×3 IMPLANT
COVER SURGICAL LIGHT HANDLE (MISCELLANEOUS) ×3 IMPLANT
DECANTER SPIKE VIAL GLASS SM (MISCELLANEOUS) ×3 IMPLANT
DERMABOND ADVANCED (GAUZE/BANDAGES/DRESSINGS) ×1
DERMABOND ADVANCED .7 DNX12 (GAUZE/BANDAGES/DRESSINGS) ×2 IMPLANT
DRAPE UTILITY 15X26 W/TAPE STR (DRAPE) ×6 IMPLANT
DRAPE WARM FLUID 44X44 (DRAPE) ×3 IMPLANT
DRSG PAD ABDOMINAL 8X10 ST (GAUZE/BANDAGES/DRESSINGS) ×2 IMPLANT
ELECT BLADE 6.5 EXT (BLADE) ×1 IMPLANT
ELECT CAUTERY BLADE 6.4 (BLADE) ×1 IMPLANT
ELECT REM PT RETURN 9FT ADLT (ELECTROSURGICAL) ×3
ELECTRODE REM PT RTRN 9FT ADLT (ELECTROSURGICAL) ×2 IMPLANT
GLOVE BIO SURGEON STRL SZ8 (GLOVE) ×3 IMPLANT
GLOVE BIOGEL PI IND STRL 6.5 (GLOVE) IMPLANT
GLOVE BIOGEL PI IND STRL 7.0 (GLOVE) IMPLANT
GLOVE BIOGEL PI IND STRL 8 (GLOVE) ×2 IMPLANT
GLOVE BIOGEL PI INDICATOR 6.5 (GLOVE) ×1
GLOVE BIOGEL PI INDICATOR 7.0 (GLOVE) ×1
GLOVE BIOGEL PI INDICATOR 8 (GLOVE) ×1
GLOVE SURG SS PI 7.0 STRL IVOR (GLOVE) ×1 IMPLANT
GOWN STRL REUS W/ TWL LRG LVL3 (GOWN DISPOSABLE) ×6 IMPLANT
GOWN STRL REUS W/ TWL XL LVL3 (GOWN DISPOSABLE) ×2 IMPLANT
GOWN STRL REUS W/TWL LRG LVL3 (GOWN DISPOSABLE) ×9
GOWN STRL REUS W/TWL XL LVL3 (GOWN DISPOSABLE) ×3
KIT BASIN OR (CUSTOM PROCEDURE TRAY) ×3 IMPLANT
KIT ROOM TURNOVER OR (KITS) ×6 IMPLANT
LIGASURE IMPACT 36 18CM CVD LR (INSTRUMENTS) ×1 IMPLANT
NS IRRIG 1000ML POUR BTL (IV SOLUTION) ×8 IMPLANT
PAD ARMBOARD 7.5X6 YLW CONV (MISCELLANEOUS) ×3 IMPLANT
PENCIL BUTTON HOLSTER BLD 10FT (ELECTRODE) ×1 IMPLANT
RELOAD PROXIMATE 75MM BLUE (ENDOMECHANICALS) ×6 IMPLANT
RELOAD STAPLE 75 3.8 BLU REG (ENDOMECHANICALS) IMPLANT
SCISSORS LAP 5X35 DISP (ENDOMECHANICALS) IMPLANT
SET IRRIG TUBING LAPAROSCOPIC (IRRIGATION / IRRIGATOR) IMPLANT
SLEEVE ENDOPATH XCEL 5M (ENDOMECHANICALS) ×3 IMPLANT
SPONGE GAUZE 4X4 12PLY STER LF (GAUZE/BANDAGES/DRESSINGS) ×1 IMPLANT
SPONGE LAP 18X18 X RAY DECT (DISPOSABLE) ×3 IMPLANT
STAPLER GUN LINEAR PROX 60 (STAPLE) ×1 IMPLANT
STAPLER PROXIMATE 75MM BLUE (STAPLE) ×1 IMPLANT
STAPLER VISISTAT 35W (STAPLE) ×1 IMPLANT
SUCTION POOLE TIP (SUCTIONS) ×1 IMPLANT
SUT MNCRL AB 4-0 PS2 18 (SUTURE) ×3 IMPLANT
SUT PDS AB 1 CTX 36 (SUTURE) ×2 IMPLANT
SUT VIC AB 3-0 SH 18 (SUTURE) ×2 IMPLANT
SYR BULB IRRIGATION 50ML (SYRINGE) ×1 IMPLANT
TOWEL OR 17X24 6PK STRL BLUE (TOWEL DISPOSABLE) ×6 IMPLANT
TOWEL OR 17X26 10 PK STRL BLUE (TOWEL DISPOSABLE) ×3 IMPLANT
TRAY FOLEY CATH 16FR SILVER (SET/KITS/TRAYS/PACK) ×1 IMPLANT
TRAY LAPAROSCOPIC (CUSTOM PROCEDURE TRAY) ×3 IMPLANT
TROCAR XCEL BLUNT TIP 100MML (ENDOMECHANICALS) ×1 IMPLANT
TROCAR XCEL NON-BLD 11X100MML (ENDOMECHANICALS) IMPLANT
TROCAR XCEL NON-BLD 5MMX100MML (ENDOMECHANICALS) ×3 IMPLANT
TUBING BULK SUCTION (MISCELLANEOUS) ×1 IMPLANT
TUBING INSUFF HIGH FLOW RTP (TUBING) ×1 IMPLANT
WATER STERILE IRR 1000ML POUR (IV SOLUTION) IMPLANT
YANKAUER SUCT BULB TIP NO VENT (SUCTIONS) ×1 IMPLANT

## 2013-09-27 NOTE — ED Notes (Signed)
Obtained consent. Pt ready for transport to holding area for OR

## 2013-09-27 NOTE — H&P (Signed)
Pt seen examined and agree with assessment.  Pt has peritonitis from probable ruptured appendix.  Will try laparoscopy / laparotomy and possible bowel resection/ ostomy depending on findings.The procedure has been discussed with the patient.  Alternative therapies have been discussed with the patient.  Operative risks include bleeding,  Infection,  Organ injury,  Nerve injury,  Blood vessel injury,  OSTOMY open procedure  DVT,  Pulmonary embolism,  Death,  And possible reoperation.  Medical management risks include worsening of present situation.  The success of the procedure is 50 -90 % at treating patients symptoms.  The patient understands and agrees to proceed.

## 2013-09-27 NOTE — Anesthesia Preprocedure Evaluation (Addendum)
Anesthesia Evaluation  Patient identified by MRN, date of birth, ID band Patient awake    Reviewed: Allergy & Precautions, H&P , NPO status , Patient's Chart, lab work & pertinent test results  Airway Mallampati: II TM Distance: >3 FB Neck ROM: full    Dental  (+) Dental Advidsory Given, Teeth Intact   Pulmonary neg pulmonary ROS,          Cardiovascular negative cardio ROS      Neuro/Psych    GI/Hepatic   Endo/Other  diabetes, Type 1  Renal/GU ARFRenal disease     Musculoskeletal   Abdominal   Peds  Hematology   Anesthesia Other Findings   Reproductive/Obstetrics                        Anesthesia Physical Anesthesia Plan  ASA: II  Anesthesia Plan: General ETT   Post-op Pain Management:    Induction: Intravenous, Rapid sequence and Cricoid pressure planned  Airway Management Planned: Oral ETT  Additional Equipment:   Intra-op Plan:   Post-operative Plan: Extubation in OR  Informed Consent: I have reviewed the patients History and Physical, chart, labs and discussed the procedure including the risks, benefits and alternatives for the proposed anesthesia with the patient or authorized representative who has indicated his/her understanding and acceptance.   Dental Advisory Given  Plan Discussed with: Surgeon, CRNA and Anesthesiologist  Anesthesia Plan Comments:        Anesthesia Quick Evaluation

## 2013-09-27 NOTE — ED Notes (Signed)
Pt reports severe lower abd pain that started yesterday morning, having diarrhea and vomiting. Denies any blood in emesis or diarrhea. Pt appears very pale at triage.

## 2013-09-27 NOTE — H&P (Signed)
PCP: Dr. Osborne Casco  Chief Complaint: abdominal pain  HPI: Alex Morrison is a 43 year old male with a history of type 1 diabetes who presents with abdominal pain.  Duration of symptoms is 3 days.  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  Time pattern initially was intermittent, got better on Friday and then acutely worsened on Saturday and became more persistent.  Coarse is worsening.  Associated with nausea, vomiting, diarrhea, chills, sweats, malaise and anorexia.  Last oral intake was about 6:30AM which he promptly vomited.  No modifying factors.  No aggravating or alleviating factors.  No previous symptoms.  A CT of abdomen and pelvis shows perforated bowel with pneumoperitoneum, small to moderate amount of free fluid within the abdomen and pelvis with small bowel thickening, enlarged appendix suggesting ruptured appendicitis.  We have therefore been asked to evaluate the patient.  He has a normal white count, normal lactic acid, normal electrolytes, elevated BUN/sCr.  He reports an episode of hypoglycemia this AM, glucose 243 now.  He is mildly tachycadic, afebrile and a normal blood pressure.    Past Medical History  Diagnosis Date  . Abscess of buttock   . Diabetes mellitus without complication     Past Surgical History  Procedure Laterality Date  . Incision and drainage    . Shoulder surgery  1989    Family History  Problem Relation Age of Onset  . Diabetes Mellitus II Mother   . Diabetes Mother   . Heart disease Mother   . Diabetes Mellitus I Father   . Diabetes Father   . Diabetes Mellitus II Brother   . Diabetes Brother    Social History:  reports that he has never smoked. He does not have any smokeless tobacco history on file. He reports that he does not drink alcohol or use illicit drugs.  Allergies: No Known Allergies  Medication: lantus 25units at HS Avapro 370m PO daily Novolog SSI  (Not in a hospital admission)  Results for orders placed  during the hospital encounter of 09/27/13 (from the past 48 hour(s))  CBC WITH DIFFERENTIAL     Status: Abnormal   Collection Time    09/27/13  8:11 AM      Result Value Ref Range   WBC 6.8  4.0 - 10.5 K/uL   RBC 4.75  4.22 - 5.81 MIL/uL   Hemoglobin 13.7  13.0 - 17.0 g/dL   HCT 39.4  39.0 - 52.0 %   MCV 82.9  78.0 - 100.0 fL   MCH 28.8  26.0 - 34.0 pg   MCHC 34.8  30.0 - 36.0 g/dL   RDW 13.2  11.5 - 15.5 %   Platelets 193  150 - 400 K/uL   Neutrophils Relative % 78 (*) 43 - 77 %   Neutro Abs 5.3  1.7 - 7.7 K/uL   Lymphocytes Relative 17  12 - 46 %   Lymphs Abs 1.2  0.7 - 4.0 K/uL   Monocytes Relative 5  3 - 12 %   Monocytes Absolute 0.3  0.1 - 1.0 K/uL   Eosinophils Relative 0  0 - 5 %   Eosinophils Absolute 0.0  0.0 - 0.7 K/uL   Basophils Relative 0  0 - 1 %   Basophils Absolute 0.0  0.0 - 0.1 K/uL  COMPREHENSIVE METABOLIC PANEL     Status: Abnormal   Collection Time    09/27/13  8:11 AM      Result  Value Ref Range   Sodium 141  137 - 147 mEq/L   Potassium 4.9  3.7 - 5.3 mEq/L   Chloride 102  96 - 112 mEq/L   CO2 22  19 - 32 mEq/L   Glucose, Bld 243 (*) 70 - 99 mg/dL   BUN 34 (*) 6 - 23 mg/dL   Creatinine, Ser 1.51 (*) 0.50 - 1.35 mg/dL   Calcium 9.4  8.4 - 10.5 mg/dL   Total Protein 6.9  6.0 - 8.3 g/dL   Albumin 3.9  3.5 - 5.2 g/dL   AST 22  0 - 37 U/L   ALT 29  0 - 53 U/L   Alkaline Phosphatase 106  39 - 117 U/L   Total Bilirubin 0.9  0.3 - 1.2 mg/dL   GFR calc non Af Amer 55 (*) >90 mL/min   GFR calc Af Amer 63 (*) >90 mL/min   Comment: (NOTE)     The eGFR has been calculated using the CKD EPI equation.     This calculation has not been validated in all clinical situations.     eGFR's persistently <90 mL/min signify possible Chronic Kidney     Disease.   Anion gap 17 (*) 5 - 15  LIPASE, BLOOD     Status: Abnormal   Collection Time    09/27/13  8:11 AM      Result Value Ref Range   Lipase 8 (*) 11 - 59 U/L  CBG MONITORING, ED     Status: Abnormal    Collection Time    09/27/13  8:12 AM      Result Value Ref Range   Glucose-Capillary 224 (*) 70 - 99 mg/dL   Comment 1 Documented in Chart     Comment 2 Notify RN    I-STAT CG4 LACTIC ACID, ED     Status: None   Collection Time    09/27/13  9:53 AM      Result Value Ref Range   Lactic Acid, Venous 1.95  0.5 - 2.2 mmol/L   Ct Abdomen Pelvis W Contrast  09/27/2013   CLINICAL DATA:  43 year old male with abdominal and pelvic pain, nausea, vomiting and diarrhea.  EXAM: CT ABDOMEN AND PELVIS WITH CONTRAST  TECHNIQUE: Multidetector CT imaging of the abdomen and pelvis was performed using the standard protocol following bolus administration of intravenous contrast.  CONTRAST:  110m OMNIPAQUE IOHEXOL 300 MG/ML  SOLN  COMPARISON:  None.  FINDINGS: Pneumoperitoneum is identified, scattered foci of pneumoperitoneum identified compatible with perforated bowel. The source of this perforation is difficult to determine but appears if there is an enlarged irregular appendix and this may represent ruptured appendicitis.  A small to moderate amount of free fluid within the abdomen and pelvis noted as well as diffuse wall thickening of the majority of the small bowel which is likely reactive.  No dilated bowel loops are identified.  The liver, gallbladder, spleen, adrenal glands, kidneys and pancreas are unremarkable.  The bladder is unremarkable.  There is no evidence of biliary dilatation or abdominal aortic aneurysm.  No acute or suspicious bony abnormalities are identified.  IMPRESSION: Perforated bowel with pneumoperitoneum, small to moderate amount of free fluid within the abdomen/pelvis and wall thickening of the majority of the small bowel, likely reactive. The source of this perforation is difficult to determine but there appears to be in enlarged irregular appendix suspicious for ruptured appendicitis.  Critical Value/emergent results were called by telephone at the time of interpretation on 09/27/2013  at 11:56  am to Dr. Jeannett Senior , who verbally acknowledged these results.   Electronically Signed   By: Hassan Rowan M.D.   On: 09/27/2013 11:58   Dg Abd Acute W/chest  09/27/2013   CLINICAL DATA:  Abdominal pain. Intermittent emesis yesterday. Chronic diarrhea.  EXAM: ACUTE ABDOMEN SERIES (ABDOMEN 2 VIEW & CHEST 1 VIEW)  COMPARISON:  Chest radiograph 07/16/2012  FINDINGS: Heart, mediastinal, and hilar contours are normal. Lung volumes are slightly low on the lungs are clear. Negative for pleural effusion or pneumothorax. Trachea is midline.  The stomach is moderately distended and contains an air-fluid level at the fundus. There is gaseous distention of the first and second portions of the duodenum, with air-fluid level seen in the second portion of the due to duodenum. There is a paucity of gas distal to the dilated duodenum. No abdominal mass effect. Moderate amount of stool in the visualized portion of the rectum. No acute osseous abnormality.  IMPRESSION: Findings concerning for bowel obstruction at the level of the duodenum.  No acute cardiopulmonary disease.   Electronically Signed   By: Curlene Dolphin M.D.   On: 09/27/2013 10:34    Review of Systems  All other systems reviewed and are negative.   Blood pressure 155/83, pulse 102, temperature 98.8 F (37.1 C), temperature source Oral, resp. rate 20, SpO2 96.00%. Physical Exam  Constitutional: He is oriented to person, place, and time. He appears well-developed and well-nourished. No distress.  Neck: Normal range of motion. Neck supple.  Cardiovascular: Normal rate, regular rhythm, normal heart sounds and intact distal pulses.  Exam reveals no gallop and no friction rub.   No murmur heard. tachycardic  Respiratory: Effort normal and breath sounds normal. No respiratory distress. He has no wheezes. He has no rales. He exhibits no tenderness.  GI: Soft. He exhibits no distension and no mass. There is no rebound.  Diffusely tender with voluntary  guarding, abdomen is soft, hypoactive bowel sounds  Musculoskeletal: Normal range of motion. He exhibits no edema and no tenderness.  Lymphadenopathy:    He has no cervical adenopathy.  Neurological: He is alert and oriented to person, place, and time.  Skin: Skin is warm and dry. No rash noted. He is not diaphoretic. No erythema. No pallor.  Psychiatric: He has a normal mood and affect. His behavior is normal. Judgment and thought content normal.     Assessment/Plan Perforated bowel with pneumoperitoneum  Mild-moderate free pelvic fluid  Small bowel thickening -We will proceed with urgent diagnostic laparoscopy with possible laparotomy. -IV atbx: zosyn indicated -IV hydration -obtain consent -SCD/post op lovenox unless otherwise contraindicated -pain control/antiemetics Type I diabetes mellitus -CBGs, SSI -hold lantus for now AKI -due to dehydration, aggressive IV hydration, repeat labs in AM -check UA   Iniya Matzek, [facility] ANP-BC Pager [phone] 09/27/2013, 12:23 PM

## 2013-09-27 NOTE — Transfer of Care (Signed)
Immediate Anesthesia Transfer of Care Note  Patient: Alex Morrison  Procedure(s) Performed: Procedure(s): LAPAROSCOPY DIAGNOSTIC (N/A) LAPAROSCOPIC ABDOMINAL EXPLORATION: INTERNAL ILIEUM, CECUM, RIGHT COLON (N/A)  Patient Location: PACU  Anesthesia Type:General  Level of Consciousness: awake  Airway & Oxygen Therapy: Patient Spontanous Breathing and Patient connected to nasal cannula oxygen  Post-op Assessment: Report given to PACU RN and Post -op Vital signs reviewed and stable  Post vital signs: Reviewed and stable  Complications: No apparent anesthesia complications

## 2013-09-27 NOTE — ED Notes (Signed)
Pt aware of urine sample needed, pt stated " he didn't have to go at the time, but will let us know when he does." RN notified.

## 2013-09-27 NOTE — ED Notes (Addendum)
Pt isn't able to give sample at the time, pt states " I don't have the urge to go right now." RN notified

## 2013-09-27 NOTE — Anesthesia Postprocedure Evaluation (Signed)
Anesthesia Post Note  Patient: Alex Morrison  Procedure(s) Performed: Procedure(s) (LRB): LAPAROSCOPY DIAGNOSTIC (N/A) LAPAROSCOPIC ABDOMINAL EXPLORATION: INTERNAL ILIEUM, CECUM, RIGHT COLON (N/A)  Anesthesia type: General  Patient location: PACU  Post pain: Pain level controlled and Adequate analgesia  Post assessment: Post-op Vital signs reviewed, Patient's Cardiovascular Status Stable, Respiratory Function Stable, Patent Airway and Pain level controlled  Last Vitals:  Filed Vitals:   09/27/13 1809  BP: 118/60  Pulse: 108  Temp:   Resp: 17    Post vital signs: Reviewed and stable  Level of consciousness: awake, alert  and oriented  Complications: No apparent anesthesia complications

## 2013-09-27 NOTE — Anesthesia Procedure Notes (Signed)
Procedure Name: Intubation Date/Time: 09/27/2013 3:11 PM Performed by: Marinda Elk A Pre-anesthesia Checklist: Timeout performed, Patient identified, Emergency Drugs available, Suction available and Patient being monitored Patient Re-evaluated:Patient Re-evaluated prior to inductionOxygen Delivery Method: Circle system utilized Preoxygenation: Pre-oxygenation with 100% oxygen Intubation Type: IV induction, Rapid sequence and Cricoid Pressure applied Laryngoscope Size: Mac and 3 Grade View: Grade III Tube type: Oral Tube size: 7.5 mm Number of attempts: 1 Airway Equipment and Method: Stylet Placement Confirmation: ETT inserted through vocal cords under direct vision,  breath sounds checked- equal and bilateral and positive ETCO2 Secured at: 22 cm Tube secured with: Tape Dental Injury: Teeth and Oropharynx as per pre-operative assessment

## 2013-09-27 NOTE — Op Note (Signed)
Alex Morrison, Alex Morrison               ACCOUNT NO.:  [id number]  MEDICAL RECORD NO.:  [id number]  LOCATION:  3S04C                        FACILITY:  [facility]  PHYSICIAN:  Marcello Moores A. Calloway Andrus, M.D.DATE OF BIRTH:  October 13, 1970  DATE OF PROCEDURE:  09/27/2013 DATE OF DISCHARGE:                              OPERATIVE REPORT   PREOPERATIVE DIAGNOSES:  Diffuse peritonitis and pneumoperitoneum.  POSTOPERATIVE DIAGNOSES:  Perforated appendix with diffuse severe peritonitis.  PROCEDURE: 1. Diagnostic laparoscopy. 2. Exploratory laparotomy with ileocecectomy.  SURGEON:  Marcello Moores A. Lazara Grieser, M.D.  ANESTHESIA:  General endotracheal anesthesia.  ESTIMATED BLOOD LOSS:  About 80 mL.  IV FLUIDS:  6 L of crystalloid.  SPECIMENS:  Terminal ileum and cecum with perforated appendix to Pathology.  DRAINS:  None.  INDICATIONS FOR PROCEDURE:  The patient is a 43 year old male with type 1 diabetes with 3 days of abdominal pain.  It started 3 days ago, has become more progressive, sharp and diffuse throughout his abdomen. Movement makes it worse.  He underwent CT scanning which showed small dots of intraperitoneal air and significant intraabdominal fluid worrisome for perforation of the GI tract.  He had thickening around the cecum and concerned for perforated appendix.  Upon examination, he had diffuse peritonitis.  I recommended laparoscopy with possible laparotomy for a perforated appendix.  Risk, benefits, and alternative therapies were discussed.  He was not a candidate for medical management due to his diffuse peritonitis and evidence of acute kidney injury.  Risk of bleeding, infection, death, open wound, hernia, organ injury, the need for further operative procedures, bowel injury, kidney injury, ureter injury, bladder injury, DVT, myocardial infarction, stroke, exacerbation of underlying medical problems, the need for other procedures discussed. He understood the above and agreed to  proceed.  DESCRIPTION OF PROCEDURE:  The patient was taken emergently from the emergency room to the holding area outside the operating room.  His consent was signed.  Questions were answered with his family.  He was taken back to the operating room and placed supine.  After induction of general anesthesia, a Foley catheter was placed under sterile conditions for strict I and O monitoring.  Arms were put on armboard's and abdomen was prepped and draped in sterile fashion.  Time-out was done.  He had pneumatic compression hose or warming in place.  He received Zosyn in the emergency room.  A time-out was done and the procedure, patient and other issue were discussed upfront.  We agreed to proceed.  A 1 cm supraumbilical incision was made.  Dissection was carried down the fascia and the fascia was opened in the midline and the abdominal cavity was entered without difficulty.  Pursestring suture of 0 Vicryl was placed and 12 mm Hasson cannula placed under direct vision. Pneumoperitoneum was created to 15 mmHg of CO2 and a laparoscope was placed.  Laparoscopy was performed.  He had severe diffuse peritonitis involving the entire abdominal cavity with severely inflamed loops of small bowel as well as the peritoneal lining of the abdominal wall. After seeing this, I felt a conversion to open procedure was necessary. I removed the trocar of the CO2 to escape.  Midline incision was used and dissection was carried  down at the abdominal cavity.  A retractor was placed and copious amounts of foul-smelling fluid were suctioned out of the abdominal cavity and there was severe peritonitis involving the small bowel, pelvis and all peritoneal linings.  We irrigated with about 4 L of saline.  It was still quite contaminated.  We used another 2 more to help clear this out.  I ran the small intestine from ligament of Treitz to the ileocecal valve.  There was significant thickening of all the peritoneum and  this appeared to be somewhat chronic in nature.  I identified the appendix and the base had blown out where it intersected with the cecum.  I felt that a ileocecectomy would be his best option to cut back to healthy tissue both in the small bowel side and colon side. I mobilized the colon along the white line of Toldt and there was significant retroperitoneal edema.  Also, the terminal ileum was stuck along the right pelvic sidewall and the peritoneum was thickened and rubbery.  Cautery was used to carefully dissect this away with care not to injure any underlying structures.  I was then able to mobilize the terminal ileum up in the wound and the right colon all the way to around the hepatic flexure.  The duodenum was seen with this mobilization pushed away.  I divided the terminal ileum approximately 15 cm from the ileocecal valve with the GIA 75 stapling device.  A second load was fired proximal to the hepatic flexure.  LigaSure was used to take the mesentery down without difficulty.  There were some enlarged lymph nodes noted in the mesentery and a mass-like effect at the cecum.  I was not sure if this was all from appendicitis or if he had a tumor there.  In any event, we took a fair amount of the mesentery just in case this was a malignancy which was a possibility looking at it.  We then created a side-to-side functional end-to-end anastomosis after removing the specimen using a GIA-75 stapling device and TA-60 to close the common enterotomy defect.  Mesenteric defect closed with 3-0 Vicryl.  This laid in the right upper quadrant without any kinking or tension.  Single stitch of 3-0 Vicryl was placed at the crotch of the anastomosis.  I then irrigated with 4 more L of crystalloid until it came back clear. Sponges, needle, and instrument counts were found to be correct.  We then closed the fascia with #1 PDS.  I packed the wound open, since it was a class IV wound.  Dry dressings were  applied.  The patient remained hemodynamically stable, was extubated and taken to recovery in satisfactory condition.     Phillip Maffei A. Carrine Kroboth, M.D.     TAC/MEDQ  D:  09/27/2013  T:  09/27/2013  Job:  [id number]

## 2013-09-27 NOTE — Brief Op Note (Signed)
09/27/2013  5:31 PM  PATIENT:  Alex Morrison  43 y.o. male  PRE-OPERATIVE DIAGNOSIS:  pneuomoperitoneum  POST-OPERATIVE DIAGNOSIS:  pneuomoperitoneum   From perforated appendix with severe diffuse appendicitis  PROCEDURE:  Laparoscopy converted to open ileocecectomy  SURGEON:  Surgeon(s) and Role:    * Erroll Luna, MD - Primary    ASSISTANTS: OR staff   ANESTHESIA:   general  EBL:  Total I/O In: 6000 [I.V.:6000] Out: 930 [Urine:480; Other:400; Blood:50]  BLOOD ADMINISTERED:none  DRAINS: none   LOCAL MEDICATIONS USED:  NONE  SPECIMEN:  Source of Specimen:  ileum and cecum   DISPOSITION OF SPECIMEN:  PATHOLOGY  COUNTS:  YES  TOURNIQUET:  * No tourniquets in log *  DICTATION: .Other Dictation: Dictation Number [id number]  PLAN OF CARE: Admit to inpatient   PATIENT DISPOSITION:  PACU - hemodynamically stable.   Delay start of Pharmacological VTE agent (>24hrs) due to surgical blood loss or risk of bleeding: no

## 2013-09-27 NOTE — ED Notes (Signed)
Lactic acid results shown to the primary nurse [facility]

## 2013-09-27 NOTE — ED Provider Notes (Signed)
CSN: [id number]     Arrival date & time 09/27/13  0736 History   First MD Initiated Contact with Patient 09/27/13 0827     Chief Complaint  Patient presents with  . Abdominal Pain  . Emesis  . Diarrhea     (Consider location/radiation/quality/duration/timing/severity/associated sxs/prior Treatment) HPI MEKHI RAYER is a 43 y.o. male with hx of DM,  who presents to ED with complaint of abdominal pain. Pt states he was pulling on some type of equipment at work yesterday. States felt sharp pain in right lower abdomen. Pain since then with chills, nausea, vomiting.  Denies diarrhea. States no appetite. Pain worsened today so came to ED. Denies fever. No other complaints. No prior abd surgeries.   Past Medical History  Diagnosis Date  . Abscess of buttock   . Diabetes mellitus without complication    Past Surgical History  Procedure Laterality Date  . Incision and drainage     Family History  Problem Relation Age of Onset  . Diabetes Mellitus II Mother   . Diabetes Mellitus I Father   . Diabetes Mellitus II Brother    History  Substance Use Topics  . Smoking status: Never Smoker   . Smokeless tobacco: Not on file  . Alcohol Use: No    Review of Systems  Constitutional: Positive for chills and fatigue. Negative for fever.  Respiratory: Negative for cough, chest tightness and shortness of breath.   Cardiovascular: Negative for chest pain, palpitations and leg swelling.  Gastrointestinal: Positive for nausea, vomiting and abdominal pain. Negative for diarrhea and abdominal distention.  Genitourinary: Negative for dysuria, urgency, frequency and hematuria.  Musculoskeletal: Negative for arthralgias, myalgias, neck pain and neck stiffness.  Skin: Negative for rash.  Allergic/Immunologic: Negative for immunocompromised state.  Neurological: Positive for headaches. Negative for dizziness, light-headedness and numbness.  All other systems reviewed and are  negative.     Allergies  Review of patient's allergies indicates no known allergies.  Home Medications   Prior to Admission medications   Medication Sig Start Date End Date Taking? Authorizing Provider  insulin aspart (NOVOLOG) 100 UNIT/ML injection Before each meal 3 times a day, 140-199 - 2 units, 200-250 - 4 units, 251-299 - 6 units,  300-349 - 8 units,  350 or above 10 units. Insulin PEN if approved, provide syringes and needles if needed. 07/18/12  Yes Thurnell Lose, MD  insulin glargine (LANTUS) 100 UNIT/ML injection Inject 25 Units into the skin at bedtime.   Yes Historical Provider, MD  irbesartan (AVAPRO) 300 MG tablet Take 300 mg by mouth daily. 08/25/13  Yes Historical Provider, MD   BP 152/86  Pulse 108  Temp(Src) 98.8 F (37.1 C) (Oral)  Resp 21  SpO2 100% Physical Exam  Nursing note and vitals reviewed. Constitutional: He appears well-developed and well-nourished. No distress.  HENT:  Head: Normocephalic and atraumatic.  Eyes: Conjunctivae are normal.  Neck: Neck supple.  Cardiovascular: Regular rhythm and normal heart sounds.   tachcyardic  Pulmonary/Chest: Effort normal. No respiratory distress. He has no wheezes. He has no rales.  Abdominal: Bowel sounds are normal. He exhibits no distension. There is tenderness. There is guarding.  Diffuse tenderness with worse tenderness in RLQ. Abdomen is rigid.   Musculoskeletal: He exhibits no edema.  Neurological: He is alert.  Skin: Skin is warm and dry.    ED Course  Procedures (including critical care time) Labs Review Labs Reviewed  CBC WITH DIFFERENTIAL - Abnormal; Notable for the following:  Neutrophils Relative % 78 (*)    All other components within normal limits  COMPREHENSIVE METABOLIC PANEL - Abnormal; Notable for the following:    Glucose, Bld 243 (*)    BUN 34 (*)    Creatinine, Ser 1.51 (*)    GFR calc non Af Amer 55 (*)    GFR calc Af Amer 63 (*)    Anion gap 17 (*)    All other components  within normal limits  LIPASE, BLOOD - Abnormal; Notable for the following:    Lipase 8 (*)    All other components within normal limits  CBG MONITORING, ED - Abnormal; Notable for the following:    Glucose-Capillary 224 (*)    All other components within normal limits  URINALYSIS, ROUTINE W REFLEX MICROSCOPIC  I-STAT CG4 LACTIC ACID, ED    Imaging Review Ct Abdomen Pelvis W Contrast  09/27/2013   CLINICAL DATA:  43 year old male with abdominal and pelvic pain, nausea, vomiting and diarrhea.  EXAM: CT ABDOMEN AND PELVIS WITH CONTRAST  TECHNIQUE: Multidetector CT imaging of the abdomen and pelvis was performed using the standard protocol following bolus administration of intravenous contrast.  CONTRAST:  152mL OMNIPAQUE IOHEXOL 300 MG/ML  SOLN  COMPARISON:  None.  FINDINGS: Pneumoperitoneum is identified, scattered foci of pneumoperitoneum identified compatible with perforated bowel. The source of this perforation is difficult to determine but appears if there is an enlarged irregular appendix and this may represent ruptured appendicitis.  A small to moderate amount of free fluid within the abdomen and pelvis noted as well as diffuse wall thickening of the majority of the small bowel which is likely reactive.  No dilated bowel loops are identified.  The liver, gallbladder, spleen, adrenal glands, kidneys and pancreas are unremarkable.  The bladder is unremarkable.  There is no evidence of biliary dilatation or abdominal aortic aneurysm.  No acute or suspicious bony abnormalities are identified.  IMPRESSION: Perforated bowel with pneumoperitoneum, small to moderate amount of free fluid within the abdomen/pelvis and wall thickening of the majority of the small bowel, likely reactive. The source of this perforation is difficult to determine but there appears to be in enlarged irregular appendix suspicious for ruptured appendicitis.  Critical Value/emergent results were called by telephone at the time of  interpretation on 09/27/2013 at 11:56 am to Dr. Jeannett Senior , who verbally acknowledged these results.   Electronically Signed   By: Hassan Rowan M.D.   On: 09/27/2013 11:58   Dg Abd Acute W/chest  09/27/2013   CLINICAL DATA:  Abdominal pain. Intermittent emesis yesterday. Chronic diarrhea.  EXAM: ACUTE ABDOMEN SERIES (ABDOMEN 2 VIEW & CHEST 1 VIEW)  COMPARISON:  Chest radiograph 07/16/2012  FINDINGS: Heart, mediastinal, and hilar contours are normal. Lung volumes are slightly low on the lungs are clear. Negative for pleural effusion or pneumothorax. Trachea is midline.  The stomach is moderately distended and contains an air-fluid level at the fundus. There is gaseous distention of the first and second portions of the duodenum, with air-fluid level seen in the second portion of the due to duodenum. There is a paucity of gas distal to the dilated duodenum. No abdominal mass effect. Moderate amount of stool in the visualized portion of the rectum. No acute osseous abnormality.  IMPRESSION: Findings concerning for bowel obstruction at the level of the duodenum.  No acute cardiopulmonary disease.   Electronically Signed   By: Curlene Dolphin M.D.   On: 09/27/2013 10:34     EKG Interpretation None  MDM   Final diagnoses:  Bowel perforation    9:00 AM Pt seen and examined. Abdomen rigid. Concerning for acute abdomen. Discussed with Dr. Eulis Foster. Will order acute abd series and CT abd/pelvis. Acute abd to rule out free air. Dilaudid ordered for pain. Labs pending.   12:06 PM Free air. On CT abd/pelvis, possibly ruptured appendix. PT mildly tachycardic. Afebrile. No elevated WBC. Lactate normal. Will page general surgery.   Spoke with surgery, they will come see and admit pt. Asked if prefer any particular antibiotic, was told they will come by and order them.       Renold Genta, PA-C 09/28/13 1622  Renold Genta, PA-C 09/28/13 1623

## 2013-09-28 ENCOUNTER — Encounter (HOSPITAL_COMMUNITY): Payer: Self-pay | Admitting: General Surgery

## 2013-09-28 DIAGNOSIS — K631 Perforation of intestine (nontraumatic): Secondary | ICD-10-CM

## 2013-09-28 DIAGNOSIS — E109 Type 1 diabetes mellitus without complications: Secondary | ICD-10-CM

## 2013-09-28 DIAGNOSIS — N179 Acute kidney failure, unspecified: Secondary | ICD-10-CM

## 2013-09-28 LAB — URINALYSIS, ROUTINE W REFLEX MICROSCOPIC
GLUCOSE, UA: NEGATIVE mg/dL
Glucose, UA: 100 mg/dL — AB
Ketones, ur: 15 mg/dL — AB
Ketones, ur: NEGATIVE mg/dL
Leukocytes, UA: NEGATIVE
Leukocytes, UA: NEGATIVE
Nitrite: NEGATIVE
Nitrite: NEGATIVE
PH: 5 (ref 5.0–8.0)
Protein, ur: 100 mg/dL — AB
Protein, ur: 100 mg/dL — AB
Specific Gravity, Urine: 1.03 (ref 1.005–1.030)
Specific Gravity, Urine: 1.045 — ABNORMAL HIGH (ref 1.005–1.030)
Urobilinogen, UA: 0.2 mg/dL (ref 0.0–1.0)
Urobilinogen, UA: 0.2 mg/dL (ref 0.0–1.0)
pH: 5 (ref 5.0–8.0)

## 2013-09-28 LAB — SODIUM, URINE, RANDOM

## 2013-09-28 LAB — URINE MICROSCOPIC-ADD ON

## 2013-09-28 LAB — GLUCOSE, CAPILLARY
GLUCOSE-CAPILLARY: 191 mg/dL — AB (ref 70–99)
Glucose-Capillary: 173 mg/dL — ABNORMAL HIGH (ref 70–99)
Glucose-Capillary: 175 mg/dL — ABNORMAL HIGH (ref 70–99)
Glucose-Capillary: 179 mg/dL — ABNORMAL HIGH (ref 70–99)
Glucose-Capillary: 200 mg/dL — ABNORMAL HIGH (ref 70–99)
Glucose-Capillary: 206 mg/dL — ABNORMAL HIGH (ref 70–99)
Glucose-Capillary: 226 mg/dL — ABNORMAL HIGH (ref 70–99)

## 2013-09-28 LAB — CBC
HCT: 31.4 % — ABNORMAL LOW (ref 39.0–52.0)
HEMOGLOBIN: 10.6 g/dL — AB (ref 13.0–17.0)
MCH: 28.9 pg (ref 26.0–34.0)
MCHC: 33.8 g/dL (ref 30.0–36.0)
MCV: 85.6 fL (ref 78.0–100.0)
Platelets: 152 10*3/uL (ref 150–400)
RBC: 3.67 MIL/uL — AB (ref 4.22–5.81)
RDW: 14 % (ref 11.5–15.5)
WBC: 10.2 10*3/uL (ref 4.0–10.5)

## 2013-09-28 LAB — BASIC METABOLIC PANEL
Anion gap: 10 (ref 5–15)
BUN: 40 mg/dL — ABNORMAL HIGH (ref 6–23)
CHLORIDE: 112 meq/L (ref 96–112)
CO2: 21 mEq/L (ref 19–32)
Calcium: 7.5 mg/dL — ABNORMAL LOW (ref 8.4–10.5)
Creatinine, Ser: 1.9 mg/dL — ABNORMAL HIGH (ref 0.50–1.35)
GFR calc Af Amer: 48 mL/min — ABNORMAL LOW (ref >90)
GFR, EST NON AFRICAN AMERICAN: 41 mL/min — AB (ref >90)
GLUCOSE: 213 mg/dL — AB (ref 70–99)
POTASSIUM: 4.8 meq/L (ref 3.7–5.3)
SODIUM: 143 meq/L (ref 137–147)

## 2013-09-28 LAB — OSMOLALITY, URINE: Osmolality, Ur: 703 mOsm/kg (ref 390–1090)

## 2013-09-28 LAB — OSMOLALITY: OSMOLALITY: 314 mosm/kg — AB (ref 275–300)

## 2013-09-28 LAB — CREATININE, URINE, RANDOM: Creatinine, Urine: 202.46 mg/dL

## 2013-09-28 MED ORDER — PNEUMOCOCCAL VAC POLYVALENT 25 MCG/0.5ML IJ INJ
0.5000 mL | INJECTION | INTRAMUSCULAR | Status: AC
  Administered 2013-09-30: 0.5 mL via INTRAMUSCULAR
  Filled 2013-09-28: qty 0.5

## 2013-09-28 MED ORDER — INSULIN GLARGINE 100 UNIT/ML ~~LOC~~ SOLN
15.0000 [IU] | Freq: Every day | SUBCUTANEOUS | Status: DC
  Administered 2013-09-28 – 2013-09-29 (×2): 15 [IU] via SUBCUTANEOUS
  Filled 2013-09-28 (×4): qty 0.15

## 2013-09-28 MED ORDER — FENTANYL CITRATE 0.05 MG/ML IJ SOLN
25.0000 ug | INTRAMUSCULAR | Status: DC | PRN
  Administered 2013-09-28 (×2): 50 ug via INTRAVENOUS
  Filled 2013-09-28 (×2): qty 2

## 2013-09-28 NOTE — Progress Notes (Signed)
Agree with above 

## 2013-09-28 NOTE — Consult Note (Addendum)
Patient Demographics  Alex Morrison, is a 43 y.o. male   MRN: [id number]   DOB - Mar 11, 1970  Admit Date - 09/27/2013    Outpatient Primary MD for the patient is Pcp Not In System  Consult requested in the Hospital by Nolon Nations, MD, On 09/28/2013    Reason for consult ARF, DM-1   With History of -  Past Medical History  Diagnosis Date  . Abscess of buttock   . Diabetes mellitus without complication   . Acute kidney injury 09/27/2013  . Perforated appendix     Peritonitis      Past Surgical History  Procedure Laterality Date  . Incision and drainage    . Shoulder surgery  1989  . Ex lap with ileocecectomy  09/27/13    Dr. Brantley Stage    in for   Chief Complaint  Patient presents with  . Abdominal Pain  . Emesis  . Diarrhea     HPI  Alex Morrison  is a 43 y.o. male, with history of type 1 diabetes who is on Lantus and NovoLog with meals, no other known medical problems was brought into the hospital for abdominal pain and was found to have a perforated appendix, he was admitted by general surgery 2 days ago and underwent expiratory lap with ileal colectomy, in the ER he had a CT with IV contrast during his investigation which was absolutely needed. In the postop period he has done fairly well, besides mild abdominal pain he has no complaints. On his blood work today he was noted to have worsening renal function, his sugars are running slightly high and I was called to assist with the same.    Review of Systems    In addition to the HPI above,   No Fever-chills, No Headache, No changes with Vision or hearing, No problems swallowing food or Liquids, No Chest pain, Cough or Shortness of Breath, +ve Abdominal pain, No Nausea or Vommitting, Bowel movements are regular, No Blood in stool or Urine, No dysuria, No new skin  rashes or bruises, No new joints pains-aches,  No new weakness, tingling, numbness in any extremity, No recent weight gain or loss, No polyuria, polydypsia or polyphagia, No significant Mental Stressors.  A full 10 point Review of Systems was done, except as stated above, all other Review of Systems were negative.   Social History History  Substance Use Topics  . Smoking status: Never Smoker   . Smokeless tobacco: Not on file  . Alcohol Use: No      Family History Family History  Problem Relation Age of Onset  . Diabetes Mellitus II Mother   . Diabetes Mother   . Heart disease Mother   . Diabetes Mellitus I Father   . Diabetes Father   . Diabetes Mellitus II Brother   . Diabetes Brother      Prior to Admission medications   Medication Sig Start Date End Date Taking? Authorizing Provider  insulin aspart (NOVOLOG) 100 UNIT/ML injection Before  each meal 3 times a day, 140-199 - 2 units, 200-250 - 4 units, 251-299 - 6 units,  300-349 - 8 units,  350 or above 10 units. Insulin PEN if approved, provide syringes and needles if needed. 07/18/12  Yes Thurnell Lose, MD  insulin glargine (LANTUS) 100 UNIT/ML injection Inject 25 Units into the skin at bedtime.   Yes Historical Provider, MD  irbesartan (AVAPRO) 300 MG tablet Take 300 mg by mouth daily. 08/25/13  Yes Historical Provider, MD    Anti-infectives   Start     Dose/Rate Route Frequency Ordered Stop   09/27/13 2200  piperacillin-tazobactam (ZOSYN) IVPB 3.375 g     3.375 g 12.5 mL/hr over 240 Minutes Intravenous 3 times per day 09/27/13 1234     09/27/13 1300  piperacillin-tazobactam (ZOSYN) IVPB 3.375 g     3.375 g 100 mL/hr over 30 Minutes Intravenous  Once 09/27/13 1242 09/27/13 1400      Scheduled Meds: . enoxaparin (LOVENOX) injection  40 mg Subcutaneous Q24H  . insulin aspart  0-15 Units Subcutaneous 6 times per day  . piperacillin-tazobactam (ZOSYN)  IV  3.375 g Intravenous 3 times per day  . [START ON  09/29/2013] pneumococcal 23 valent vaccine  0.5 mL Intramuscular Tomorrow-1000   Continuous Infusions: . sodium chloride 125 mL/hr at 09/28/13 0509   PRN Meds:.acetaminophen, iohexol, ondansetron  No Known Allergies  Physical Exam  Vitals  Blood pressure 113/57, pulse 103, temperature 98.3 F (36.8 C), temperature source Oral, resp. rate 10, height 5\' 11"  (1.803 m), weight 106 kg (233 lb 11 oz), SpO2 99.00%.   1. General Young white male lying in bed in NAD,     2. Normal affect and insight, Not Suicidal or Homicidal, Awake Alert, Oriented X 3.  3. No F.N deficits, ALL C.Nerves Intact, Strength 5/5 all 4 extremities, Sensation intact all 4 extremities, Plantars down going.  4. Ears and Eyes appear Normal, Conjunctivae clear, PERRLA. Moist Oral Mucosa.  5. Supple Neck, No JVD, No cervical lymphadenopathy appriciated, No Carotid Bruits.  6. Symmetrical Chest wall movement, Good air movement bilaterally, CTAB.  7. RRR, No Gallops, Rubs or Murmurs, No Parasternal Heave.  8. hypoactive Bowel Sounds, Abdomen Soft, No tenderness, No organomegaly appriciated, No rebound -guarding or rigidity. Wearing a Foley catheter, abdominal incision under bandage  9.  No Cyanosis, Normal Skin Turgor, No Skin Rash or Bruise.  10. Good muscle tone,  joints appear normal , no effusions, Normal ROM.  11. No Palpable Lymph Nodes in Neck or Axillae     Data Review  CBC  Recent Labs Lab 09/27/13 0811 09/28/13 0610  WBC 6.8 10.2  HGB 13.7 10.6*  HCT 39.4 31.4*  PLT 193 152  MCV 82.9 85.6  MCH 28.8 28.9  MCHC 34.8 33.8  RDW 13.2 14.0  LYMPHSABS 1.2  --   MONOABS 0.3  --   EOSABS 0.0  --   BASOSABS 0.0  --    ------------------------------------------------------------------------------------------------------------------  Chemistries   Recent Labs Lab 09/27/13 0811 09/28/13 0610  NA 141 143  K 4.9 4.8  CL 102 112  CO2 22 21  GLUCOSE 243* 213*  BUN 34* 40*  CREATININE  1.51* 1.90*  CALCIUM 9.4 7.5*  AST 22  --   ALT 29  --   ALKPHOS 106  --   BILITOT 0.9  --    ------------------------------------------------------------------------------------------------------------------ estimated creatinine clearance is 61.5 ml/min (by C-G formula based on Cr of 1.9). ------------------------------------------------------------------------------------------------------------------ No results found for  this basename: TSH, T4TOTAL, FREET3, T3FREE, THYROIDAB,  in the last 72 hours   Coagulation profile No results found for this basename: INR, PROTIME,  in the last 168 hours ------------------------------------------------------------------------------------------------------------------- No results found for this basename: DDIMER,  in the last 72 hours -------------------------------------------------------------------------------------------------------------------  Cardiac Enzymes No results found for this basename: CK, CKMB, TROPONINI, MYOGLOBIN,  in the last 168 hours ------------------------------------------------------------------------------------------------------------------ No components found with this basename: POCBNP,    ---------------------------------------------------------------------------------------------------------------  Urinalysis    Component Value Date/Time   COLORURINE YELLOW 09/27/2013 2352   APPEARANCEUR CLEAR 09/27/2013 2352   LABSPEC 1.045* 09/27/2013 2352   PHURINE 5.0 09/27/2013 2352   GLUCOSEU 100* 09/27/2013 2352   HGBUR LARGE* 09/27/2013 2352   BILIRUBINUR SMALL* 09/27/2013 2352   KETONESUR 15* 09/27/2013 2352   PROTEINUR 100* 09/27/2013 2352   UROBILINOGEN 0.2 09/27/2013 2352   NITRITE NEGATIVE 09/27/2013 2352   LEUKOCYTESUR NEGATIVE 09/27/2013 2352     Imaging results:   Ct Abdomen Pelvis W Contrast  09/27/2013   CLINICAL DATA:  43 year old male with abdominal and pelvic pain, nausea, vomiting and diarrhea.  EXAM: CT  ABDOMEN AND PELVIS WITH CONTRAST  TECHNIQUE: Multidetector CT imaging of the abdomen and pelvis was performed using the standard protocol following bolus administration of intravenous contrast.  CONTRAST:  186mL OMNIPAQUE IOHEXOL 300 MG/ML  SOLN  COMPARISON:  None.  FINDINGS: Pneumoperitoneum is identified, scattered foci of pneumoperitoneum identified compatible with perforated bowel. The source of this perforation is difficult to determine but appears if there is an enlarged irregular appendix and this may represent ruptured appendicitis.  A small to moderate amount of free fluid within the abdomen and pelvis noted as well as diffuse wall thickening of the majority of the small bowel which is likely reactive.  No dilated bowel loops are identified.  The liver, gallbladder, spleen, adrenal glands, kidneys and pancreas are unremarkable.  The bladder is unremarkable.  There is no evidence of biliary dilatation or abdominal aortic aneurysm.  No acute or suspicious bony abnormalities are identified.  IMPRESSION: Perforated bowel with pneumoperitoneum, small to moderate amount of free fluid within the abdomen/pelvis and wall thickening of the majority of the small bowel, likely reactive. The source of this perforation is difficult to determine but there appears to be in enlarged irregular appendix suspicious for ruptured appendicitis.  Critical Value/emergent results were called by telephone at the time of interpretation on 09/27/2013 at 11:56 am to Dr. Jeannett Senior , who verbally acknowledged these results.   Electronically Signed   By: Hassan Rowan M.D.   On: 09/27/2013 11:58   Dg Abd Acute W/chest  09/27/2013   CLINICAL DATA:  Abdominal pain. Intermittent emesis yesterday. Chronic diarrhea.  EXAM: ACUTE ABDOMEN SERIES (ABDOMEN 2 VIEW & CHEST 1 VIEW)  COMPARISON:  Chest radiograph 07/16/2012  FINDINGS: Heart, mediastinal, and hilar contours are normal. Lung volumes are slightly low on the lungs are clear. Negative  for pleural effusion or pneumothorax. Trachea is midline.  The stomach is moderately distended and contains an air-fluid level at the fundus. There is gaseous distention of the first and second portions of the duodenum, with air-fluid level seen in the second portion of the due to duodenum. There is a paucity of gas distal to the dilated duodenum. No abdominal mass effect. Moderate amount of stool in the visualized portion of the rectum. No acute osseous abnormality.  IMPRESSION: Findings concerning for bowel obstruction at the level of the duodenum.  No acute cardiopulmonary disease.   Electronically  Signed   By: Curlene Dolphin M.D.   On: 09/27/2013 10:34      Assessment & Plan   1. DM type I. Will place him back on his Lantus 15 units daily from now, continue every 4 hour Accu-Cheks with CBGs. Will monitor and adjust insulin as needed.  Lab Results  Component Value Date   HGBA1C 12.7* 07/16/2012    CBG (last 3)   Recent Labs  09/27/13 2332 09/28/13 0336 09/28/13 0801  GLUCAP 226* 206* 173*      2. Acute renal failure. Likely ATN caused by a combination of IV dye of CT scan given in the setting of sepsis, hypertension, dehydration. He has a Foley, bladder appears decompressed on exam, he is being hydrated which will be continued, will obtain serum osmolality, urine sodium and osmolality. Obtain baseline renal ultrasound. Monitor BMP serially. Expect renal function to improve in the next 2-3 days. Avoid nephrotoxins and further IV dye.    3. Perforated appendix. Status post exploratory laparotomy. Management per primary team general surgery. Stop antibiotics when appropriate.    Discussed his case with his primary care physician Dr. Osborne Casco  would requested for me to do the consult.     DVT Prophylaxis SCDs and Lovenox  AM Labs Ordered, also please review Full Orders  Family Communication: Plan discussed with patient and parents   Thank you for the consult, we will follow  the patient with you in the Hospital.   Lala Lund K M.D on 09/28/2013 at 9:30 AM  Between 7am to 7pm - Pager - [phone]  After 7pm go to [email or web address] - password [email or web address]  And look for the night coverage person covering me after hours   Thank you for the consult, we will follow the patient with you in the [facility] Office  [phone]   **Disclaimer: This note may have been dictated with voice recognition software. Similar sounding words can inadvertently be transcribed and this note may contain transcription errors which may not have been corrected upon publication of note.**

## 2013-09-28 NOTE — Progress Notes (Signed)
Utilization review completed.  

## 2013-09-28 NOTE — Progress Notes (Signed)
[facility] Progress Note  1 Day Post-Op  Subjective: Pt doing okay, but c/o a lot of pain.  Using PCA.  No N/V.  NT bothering him.  No flatus or BM yet.    Objective: Vital signs in last 24 hours: Temp:  [97.4 F (36.3 C)-98.7 F (37.1 C)] 97.8 F (36.6 C) (09/14 0338) Pulse Rate:  [99-117] 103 (09/14 0338) Resp:  [10-34] 10 (09/14 0338) BP: (96-167)/(51-88) 113/57 mmHg (09/14 0338) SpO2:  [95 %-100 %] 99 % (09/14 0338) Weight:  [233 lb 11 oz (106 kg)] 233 lb 11 oz (106 kg) (09/13 1940)    Intake/Output from previous day: 09/13 0701 - 09/14 0700 In: 7707.1 [I.V.:7577.1; NG/GT:30; IV Piggyback:100] Out: 1430 [Urine:880; Emesis/NG output:100; Blood:50] Intake/Output this shift:    PE: Gen:  Alert, NAD, pleasant Abd: Soft, distended, tender throughout > over incision, no BS heard,  no HSM, dressing clean, dry, intact   Lab Results:   Recent Labs  09/27/13 0811 09/28/13 0610  WBC 6.8 10.2  HGB 13.7 10.6*  HCT 39.4 31.4*  PLT 193 152   BMET  Recent Labs  09/27/13 0811 09/28/13 0610  NA 141 143  K 4.9 4.8  CL 102 112  CO2 22 21  GLUCOSE 243* 213*  BUN 34* 40*  CREATININE 1.51* 1.90*  CALCIUM 9.4 7.5*   PT/INR No results found for this basename: LABPROT, INR,  in the last 72 hours CMP     Component Value Date/Time   NA 143 09/28/2013 0610   K 4.8 09/28/2013 0610   CL 112 09/28/2013 0610   CO2 21 09/28/2013 0610   GLUCOSE 213* 09/28/2013 0610   BUN 40* 09/28/2013 0610   CREATININE 1.90* 09/28/2013 0610   CALCIUM 7.5* 09/28/2013 0610   PROT 6.9 09/27/2013 0811   ALBUMIN 3.9 09/27/2013 0811   AST 22 09/27/2013 0811   ALT 29 09/27/2013 0811   ALKPHOS 106 09/27/2013 0811   BILITOT 0.9 09/27/2013 0811   GFRNONAA 41* 09/28/2013 0610   GFRAA 48* 09/28/2013 0610   Lipase     Component Value Date/Time   LIPASE 8* 09/27/2013 0811       Studies/Results: Ct Abdomen Pelvis W Contrast  09/27/2013   CLINICAL DATA:  43 year old male with abdominal and  pelvic pain, nausea, vomiting and diarrhea.  EXAM: CT ABDOMEN AND PELVIS WITH CONTRAST  TECHNIQUE: Multidetector CT imaging of the abdomen and pelvis was performed using the standard protocol following bolus administration of intravenous contrast.  CONTRAST:  184mL OMNIPAQUE IOHEXOL 300 MG/ML  SOLN  COMPARISON:  None.  FINDINGS: Pneumoperitoneum is identified, scattered foci of pneumoperitoneum identified compatible with perforated bowel. The source of this perforation is difficult to determine but appears if there is an enlarged irregular appendix and this may represent ruptured appendicitis.  A small to moderate amount of free fluid within the abdomen and pelvis noted as well as diffuse wall thickening of the majority of the small bowel which is likely reactive.  No dilated bowel loops are identified.  The liver, gallbladder, spleen, adrenal glands, kidneys and pancreas are unremarkable.  The bladder is unremarkable.  There is no evidence of biliary dilatation or abdominal aortic aneurysm.  No acute or suspicious bony abnormalities are identified.  IMPRESSION: Perforated bowel with pneumoperitoneum, small to moderate amount of free fluid within the abdomen/pelvis and wall thickening of the majority of the small bowel, likely reactive. The source of this perforation is difficult to determine but there appears to be  in enlarged irregular appendix suspicious for ruptured appendicitis.  Critical Value/emergent results were called by telephone at the time of interpretation on 09/27/2013 at 11:56 am to Dr. Jeannett Senior , who verbally acknowledged these results.   Electronically Signed   By: Hassan Rowan M.D.   On: 09/27/2013 11:58   Dg Abd Acute W/chest  09/27/2013   CLINICAL DATA:  Abdominal pain. Intermittent emesis yesterday. Chronic diarrhea.  EXAM: ACUTE ABDOMEN SERIES (ABDOMEN 2 VIEW & CHEST 1 VIEW)  COMPARISON:  Chest radiograph 07/16/2012  FINDINGS: Heart, mediastinal, and hilar contours are normal. Lung  volumes are slightly low on the lungs are clear. Negative for pleural effusion or pneumothorax. Trachea is midline.  The stomach is moderately distended and contains an air-fluid level at the fundus. There is gaseous distention of the first and second portions of the duodenum, with air-fluid level seen in the second portion of the due to duodenum. There is a paucity of gas distal to the dilated duodenum. No abdominal mass effect. Moderate amount of stool in the visualized portion of the rectum. No acute osseous abnormality.  IMPRESSION: Findings concerning for bowel obstruction at the level of the duodenum.  No acute cardiopulmonary disease.   Electronically Signed   By: Curlene Dolphin M.D.   On: 09/27/2013 10:34    Anti-infectives: Anti-infectives   Start     Dose/Rate Route Frequency Ordered Stop   09/27/13 2200  piperacillin-tazobactam (ZOSYN) IVPB 3.375 g     3.375 g 12.5 mL/hr over 240 Minutes Intravenous 3 times per day 09/27/13 1234     09/27/13 1300  piperacillin-tazobactam (ZOSYN) IVPB 3.375 g     3.375 g 100 mL/hr over 30 Minutes Intravenous  Once 09/27/13 1242 09/27/13 1400       Assessment/Plan Perforated appendix with diffuse severe peritonitis POD #1 s/p diagnostic laparoscopy converted to open Ex Lap with ileocecectomy AKI - Cr increased to 1.9 today ABL Anemia - continue to monitor Type 1 DM  Plan: 1.  NPO, IVF, pain control, antiemetics, antibiotics (Zosyn Day #2/7? - will adjust for renal failure per pharmacy) 2.  Continue SDU for now 3.  Consider renal consult if no improvements - adjust zosyn dosing 4.  IM consult for hyperglycemia and renal failure - they are planning urinalysis and renal ultrasound 5.  Ambulate and IS 6.  SCD's and lovenox for dvt proph 7.  Come out of SDU tomorrow?    LOS: 1 day    Coralie Keens 09/28/2013, 8:13 AM Pager: [phone]

## 2013-09-28 NOTE — Progress Notes (Signed)
Patient transferred from PACU via bed on tele by PACU RN. Patient oriented to unit and room, instructed on callbell and placed at side. Family at bedside. Will continue to monitor.

## 2013-09-28 NOTE — Progress Notes (Signed)
Occupational Therapy Evaluation Patient Details Name: Alex Morrison MRN: [name] DOB: 05-04-1970 Today's Date: 09/28/2013    History of Present Illness 43 yo s/p exploratory lap with ileal colectomy s/p perforated appendix. Currently with worsening renal function.   Clinical Impression   PTA, pt independent with ADL and mobility. Pt agreed to participate with OT. Vitals stable throughout session with BP 113/61; HR 106 and O2 97 RA. Min a with mobility. Primarily limited by c/o pain and nausea. Educated pt on the need to increase his mobility to assist with healing. Pt will most likely be able to D/C home with intermittent S when medically stable. Pt states his parents will be able to provide assistance as long as needed.     Follow Up Recommendations  No OT follow up;Supervision - Intermittent    Equipment Recommendations  Other (comment) (will further assess)    Recommendations for Other Services  none     Precautions / Restrictions Precautions Precautions: Fall;Other (comment) (NG tube)      Mobility Bed Mobility Overal bed mobility: Needs Assistance Bed Mobility: Rolling;Sidelying to Sit Rolling: Supervision Sidelying to sit: Min assist       General bed mobility comments: limited by pain and "feelings of anxiety"  Transfers Overall transfer level: Needs assistance   Transfers: Sit to/from Stand;Stand Pivot Transfers Sit to Stand: Min assist Stand pivot transfers: Min assist       General transfer comment: Poor control of descent to chair    Balance Overall balance assessment: Needs assistance Sitting-balance support: Feet supported;Bilateral upper extremity supported Sitting balance-Leahy Scale: Fair     Standing balance support: During functional activity Standing balance-Leahy Scale: Fair                              ADL Overall ADL's : Needs assistance/impaired Eating/Feeding: NPO (ice chips only per nursing)   Grooming:  Sitting;Set up   Upper Body Bathing: Minimal assitance;Sitting   Lower Body Bathing: Moderate assistance;Sit to/from stand   Upper Body Dressing : Minimal assistance;Sitting   Lower Body Dressing: Maximal assistance;Sit to/from stand   Toilet Transfer: Minimal assistance (simulated with recliner)     Toileting - Clothing Manipulation Details (indicate cue type and reason): foley     Functional mobility during ADLs: Minimal assistance;+2 for safety/equipment (line mgnt) General ADL Comments: limited by pain and feelings of n/v     Vision                     Perception     Praxis      Pertinent Vitals/Pain Pain Assessment: 0-10 Pain Score: 8  Pain Location: abdomen Pain Descriptors / Indicators: Aching;Grimacing Pain Intervention(s): Limited activity within patient's tolerance;Monitored during session;Repositioned;Relaxation (nurse offered pain meds)     Hand Dominance Right   Extremity/Trunk Assessment Upper Extremity Assessment Upper Extremity Assessment: Generalized weakness   Lower Extremity Assessment Lower Extremity Assessment: Defer to PT evaluation   Cervical / Trunk Assessment Cervical / Trunk Assessment: Other exceptions (guarding abdomen)   Communication Communication Communication: No difficulties   Cognition Arousal/Alertness: Awake/alert Behavior During Therapy: Flat affect Overall Cognitive Status: Within Functional Limits for tasks assessed                     General Comments       Exercises       Shoulder Instructions      Home Living Family/patient expects to  be discharged to:: Private residence Living Arrangements: Parent (lives with his brother) Available Help at Discharge: Available 24 hours/day Type of Home: House Home Access: Stairs to enter [employer] of Steps: 13 Entrance Stairs-Rails: Right Home Layout: Two level;Able to live on main level with bedroom/bathroom (difficult to understand  layout)     Bathroom Shower/Tub: Teacher, early years/pre: Standard Bathroom Accessibility: Yes How Accessible: Accessible via walker Home Equipment: None          Prior Functioning/Environment Level of Independence: Independent        Comments: owns copy/print shop    OT Diagnosis: Generalized weakness;Acute pain   OT Problem List: Decreased strength;Decreased activity tolerance;Decreased knowledge of use of DME or AE;Obesity;Pain   OT Treatment/Interventions: Self-care/ADL training;Therapeutic exercise;Energy conservation;DME and/or AE instruction;Therapeutic activities;Patient/family education    OT Goals(Current goals can be found in the care plan section) Acute Rehab OT Goals Patient Stated Goal: to get better OT Goal Formulation: With patient Time For Goal Achievement: 10/12/13 Potential to Achieve Goals: Good  OT Frequency: Min 2X/week   Barriers to D/C:            Co-evaluation              End of Session Nurse Communication: Mobility status  Activity Tolerance: Patient limited by fatigue;Other (comment) (feelings of nausea) Patient left: in chair;with call bell/phone within reach   Time: 1630-1704 OT Time Calculation (min): 34 min Charges:  OT General Charges $OT Visit: 1 Procedure OT Evaluation $Initial OT Evaluation Tier I: 1 Procedure OT Treatments $Self Care/Home Management : 23-37 mins G-Codes:    Aaditya Letizia,HILLARY 10/14/13, 5:44 PM   [facility], OTR/L  [phone] Oct 14, 2013

## 2013-09-28 NOTE — Progress Notes (Signed)
ANTIBIOTIC CONSULT NOTE - INITIAL  Pharmacy Consult for Zosyn Indication: intra-abdominal infection  No Known Allergies  Patient Measurements: Height: 5\' 11"  (180.3 cm) Weight: 233 lb 11 oz (106 kg) IBW/kg (Calculated) : 75.3  Vital Signs: Temp: 98.3 F (36.8 C) (09/14 0800) Temp src: Oral (09/14 0800) BP: 115/61 mmHg (09/14 0708) Pulse Rate: 101 (09/14 0708) Intake/Output from previous day: 09/13 0701 - 09/14 0700 In: 7707.1 [I.V.:7577.1; NG/GT:30; IV Piggyback:100] Out: 1430 [Urine:880; Emesis/NG output:100; Blood:50] Intake/Output from this shift: Total I/O In: -  Out: 125 [Urine:125]  Labs:  Recent Labs  09/27/13 0811 09/28/13 0610 09/28/13 1034  WBC 6.8 10.2  --   HGB 13.7 10.6*  --   PLT 193 152  --   LABCREA  --   --  202.46  CREATININE 1.51* 1.90*  --    Estimated Creatinine Clearance: 61.5 ml/min (by C-G formula based on Cr of 1.9).    Microbiology: Recent Results (from the past 720 hour(s))  MRSA PCR SCREENING     Status: None   Collection Time    09/27/13  7:51 PM      Result Value Ref Range Status   MRSA by PCR NEGATIVE  NEGATIVE Final   Comment:            The GeneXpert MRSA Assay (FDA     approved for NASAL specimens     only), is one component of a     comprehensive MRSA colonization     surveillance program. It is not     intended to diagnose MRSA     infection nor to guide or     monitor treatment for     MRSA infections.    Medical History: Past Medical History  Diagnosis Date  . Abscess of buttock   . Diabetes mellitus without complication   . Acute kidney injury 09/27/2013  . Perforated appendix     Peritonitis    Medications:  Scheduled:  . enoxaparin (LOVENOX) injection  40 mg Subcutaneous Q24H  . insulin aspart  0-15 Units Subcutaneous 6 times per day  . insulin glargine  15 Units Subcutaneous Daily  . piperacillin-tazobactam (ZOSYN)  IV  3.375 g Intravenous 3 times per day  . [START ON 09/29/2013] pneumococcal 23  valent vaccine  0.5 mL Intramuscular Tomorrow-1000   Infusions:  . sodium chloride 125 mL/hr at 09/28/13 0509   Assessment and Plan: [name] who started on Zosyn 9/13 for intra-abdominal infection.  Pt presented to the ED 9/13 with perforated appendix and diffuse peritonitis and went to the OR for ex-lap.  Pt is now POD#1 with slight increase in SCr.  CrCl ~ 60. WBC has also increased from 6.8 > 10.2.  Although the pt's renal function has declined, Zosyn dose adjustment is not needed at this time.  Dose adjustment for Zosyn is not recommended until CrCl <20.  No change needed.  Rx will sign off.  Electronic alerts are in place to notify Rx of CrCl <20 and we can adjust Zosyn dose at that time.   [employer], Pharm.D., BCPS Clinical Pharmacist Pager [phone] 09/28/2013 11:49 AM

## 2013-09-29 DIAGNOSIS — L409 Psoriasis, unspecified: Secondary | ICD-10-CM | POA: Diagnosis present

## 2013-09-29 LAB — URINE CULTURE
COLONY COUNT: NO GROWTH
CULTURE: NO GROWTH

## 2013-09-29 LAB — GLUCOSE, CAPILLARY
GLUCOSE-CAPILLARY: 163 mg/dL — AB (ref 70–99)
GLUCOSE-CAPILLARY: 225 mg/dL — AB (ref 70–99)
Glucose-Capillary: 185 mg/dL — ABNORMAL HIGH (ref 70–99)
Glucose-Capillary: 194 mg/dL — ABNORMAL HIGH (ref 70–99)
Glucose-Capillary: 219 mg/dL — ABNORMAL HIGH (ref 70–99)
Glucose-Capillary: 157 mg/dL — ABNORMAL HIGH (ref 70–99)

## 2013-09-29 LAB — BASIC METABOLIC PANEL
Anion gap: 13 (ref 5–15)
BUN: 42 mg/dL — AB (ref 6–23)
CHLORIDE: 114 meq/L — AB (ref 96–112)
CO2: 21 mEq/L (ref 19–32)
Calcium: 8.7 mg/dL (ref 8.4–10.5)
Creatinine, Ser: 1.56 mg/dL — ABNORMAL HIGH (ref 0.50–1.35)
GFR calc non Af Amer: 52 mL/min — ABNORMAL LOW (ref >90)
GFR, EST AFRICAN AMERICAN: 61 mL/min — AB (ref >90)
GLUCOSE: 177 mg/dL — AB (ref 70–99)
POTASSIUM: 4.3 meq/L (ref 3.7–5.3)
Sodium: 148 mEq/L — ABNORMAL HIGH (ref 137–147)

## 2013-09-29 NOTE — Progress Notes (Signed)
[facility] Progress Note  2 Days Post-Op  Subjective: Pt doing well.  No N/V, NG tube really bothering him.  No flatus or BM yet.  Refusing cepacol, chloraseptic spray, ocean spray.  Up to chair, but not ambulated yet.    Objective: Vital signs in last 24 hours: Temp:  [97.9 F (36.6 C)-99.9 F (37.7 C)] 98.8 F (37.1 C) (09/15 0414) Pulse Rate:  [97-112] 106 (09/15 0414) Resp:  [10-35] 30 (09/15 0414) BP: (113-152)/(61-75) 144/67 mmHg (09/15 0414) SpO2:  [97 %-100 %] 97 % (09/15 0414) Last BM Date: 09/26/13  Intake/Output from previous day: 09/14 0701 - 09/15 0700 In: 3045 [P.O.:120; I.V.:2875; IV Piggyback:50] Out: [id number] [Urine:1150; Emesis/NG output:675] Intake/Output this shift:    PE: Gen: Alert, NAD, pleasant  Abd: Soft, distended, tender throughout > over incision, no BS heard, no HSM, dressing clean, dry, intact   Lab Results:   Recent Labs  09/27/13 0811 09/28/13 0610  WBC 6.8 10.2  HGB 13.7 10.6*  HCT 39.4 31.4*  PLT 193 152   BMET  Recent Labs  09/28/13 0610 09/29/13 0254  NA 143 148*  K 4.8 4.3  CL 112 114*  CO2 21 21  GLUCOSE 213* 177*  BUN 40* 42*  CREATININE 1.90* 1.56*  CALCIUM 7.5* 8.7   PT/INR No results found for this basename: LABPROT, INR,  in the last 72 hours CMP     Component Value Date/Time   NA 148* 09/29/2013 0254   K 4.3 09/29/2013 0254   CL 114* 09/29/2013 0254   CO2 21 09/29/2013 0254   GLUCOSE 177* 09/29/2013 0254   BUN 42* 09/29/2013 0254   CREATININE 1.56* 09/29/2013 0254   CALCIUM 8.7 09/29/2013 0254   PROT 6.9 09/27/2013 0811   ALBUMIN 3.9 09/27/2013 0811   AST 22 09/27/2013 0811   ALT 29 09/27/2013 0811   ALKPHOS 106 09/27/2013 0811   BILITOT 0.9 09/27/2013 0811   GFRNONAA 52* 09/29/2013 0254   GFRAA 61* 09/29/2013 0254   Lipase     Component Value Date/Time   LIPASE 8* 09/27/2013 0811       Studies/Results: Ct Abdomen Pelvis W Contrast  09/27/2013   CLINICAL DATA:  43 year old male with  abdominal and pelvic pain, nausea, vomiting and diarrhea.  EXAM: CT ABDOMEN AND PELVIS WITH CONTRAST  TECHNIQUE: Multidetector CT imaging of the abdomen and pelvis was performed using the standard protocol following bolus administration of intravenous contrast.  CONTRAST:  133mL OMNIPAQUE IOHEXOL 300 MG/ML  SOLN  COMPARISON:  None.  FINDINGS: Pneumoperitoneum is identified, scattered foci of pneumoperitoneum identified compatible with perforated bowel. The source of this perforation is difficult to determine but appears if there is an enlarged irregular appendix and this may represent ruptured appendicitis.  A small to moderate amount of free fluid within the abdomen and pelvis noted as well as diffuse wall thickening of the majority of the small bowel which is likely reactive.  No dilated bowel loops are identified.  The liver, gallbladder, spleen, adrenal glands, kidneys and pancreas are unremarkable.  The bladder is unremarkable.  There is no evidence of biliary dilatation or abdominal aortic aneurysm.  No acute or suspicious bony abnormalities are identified.  IMPRESSION: Perforated bowel with pneumoperitoneum, small to moderate amount of free fluid within the abdomen/pelvis and wall thickening of the majority of the small bowel, likely reactive. The source of this perforation is difficult to determine but there appears to be in enlarged irregular appendix suspicious for ruptured appendicitis.  Critical Value/emergent results were called by telephone at the time of interpretation on 09/27/2013 at 11:56 am to Dr. Jeannett Senior , who verbally acknowledged these results.   Electronically Signed   By: Hassan Rowan M.D.   On: 09/27/2013 11:58   Dg Abd Acute W/chest  09/27/2013   CLINICAL DATA:  Abdominal pain. Intermittent emesis yesterday. Chronic diarrhea.  EXAM: ACUTE ABDOMEN SERIES (ABDOMEN 2 VIEW & CHEST 1 VIEW)  COMPARISON:  Chest radiograph 07/16/2012  FINDINGS: Heart, mediastinal, and hilar contours are  normal. Lung volumes are slightly low on the lungs are clear. Negative for pleural effusion or pneumothorax. Trachea is midline.  The stomach is moderately distended and contains an air-fluid level at the fundus. There is gaseous distention of the first and second portions of the duodenum, with air-fluid level seen in the second portion of the due to duodenum. There is a paucity of gas distal to the dilated duodenum. No abdominal mass effect. Moderate amount of stool in the visualized portion of the rectum. No acute osseous abnormality.  IMPRESSION: Findings concerning for bowel obstruction at the level of the duodenum.  No acute cardiopulmonary disease.   Electronically Signed   By: Curlene Dolphin M.D.   On: 09/27/2013 10:34    Anti-infectives: Anti-infectives   Start     Dose/Rate Route Frequency Ordered Stop   09/27/13 2200  piperacillin-tazobactam (ZOSYN) IVPB 3.375 g     3.375 g 12.5 mL/hr over 240 Minutes Intravenous 3 times per day 09/27/13 1234     09/27/13 1300  piperacillin-tazobactam (ZOSYN) IVPB 3.375 g     3.375 g 100 mL/hr over 30 Minutes Intravenous  Once 09/27/13 1242 09/27/13 1400       Assessment/Plan Perforated appendix with diffuse severe peritonitis POD #1 s/p diagnostic laparoscopy converted to open Ex Lap with ileocecectomy  AKI - Cr increased to 1.5 today  ABL Anemia - continue to monitor  Type 1 DM   Plan:  1. NPO, IVF, pain control (switch to fentanyl to try to reduce hypotension and additional injury to the kidneys), antiemetics, antibiotics (Zosyn Day #3/7? - will adjust for renal failure per pharmacy), repeat CBC tomorrow 2. Transfer to 6N today 3. Consider renal consult if no improvements - adjust zosyn dosing, Cr down to 1.56 4. IM following for hyperglycemia and renal failure - they are planning renal ultrasound  5. Ambulate and IS  6. SCD's and lovenox for dvt proph  7. Await bowel function, prior to considering removal of NG tube 8. Dressing changes WD  BID     LOS: 2 days    DORT, Jinny Blossom 09/29/2013, 7:55 AM Pager: [phone]

## 2013-09-29 NOTE — Progress Notes (Signed)
[facility]  Alex Morrison [id number] DOB: [birth year] DOA: 09/27/2013 PCP: Pcp Not In System  Brief narrative: 43 y.o. male, with history of type 1 diabetes who is on Lantus and NovoLog with meals, no other known medical problems was brought into the hospital for abdominal pain and was found to have a perforated appendix, he was admitted by General Surgery and underwent exploratoy lap with ileocecectomy.    In the ER he had a CT with IV contrast to determine the etiology of his abdominal pain. In the postop period he was noted to have worsening renal function and hypoglycemia prompting Internal Medicine consultation.  HPI/Subjective: Patient alert. Stable postoperative pain but no other complaints verbalized. Asking if Foley can be discontinued.  Assessment/Plan:    Type 1 diabetes mellitus CBGs moderately controlled at this time-continue Lantus with moderate sliding scale insulin-hemoglobin A1c 12.7 which is reflective of very poor glycemic control prior to admission therefore suspect once able to tolerate oral intake and advancement in diet likely insulin dosages will need to be further increased    Acute kidney injury Multifactorial related to ATN in setting of sepsis and hypotension in patient with likely underlying diabetic nephropathy at baseline-patient also received IV contrast during the initial diagnostic phase of his admission-CT abdomen and pelvis previously completed revealed no evidence of hydronephrosis so no indication to pursue renal ultrasound at this time-renal function slowly improving-discontinue Foley catheter and monitor response-follow labs    Acute gangrenous appendicitis with perforation and peritonitis Status post exploratory laparotomy with ileocecostomy-per primary team/general surgery-currently has postoperative ileus so unable to resume any preadmission oral medications including antihypertensives    Psoriasis Is  stable lesion right knee -was not on immunosuppressive agents prior to admission    Hypertension Blood pressure moderately controlled at this time and likely influenced by postoperative pain-monitor closely in the event IV antihypertensive agents needs to be added-was on ARB at home  DVT prophylaxis: Lovenox Code Status: Full Family Communication: Patient and parents at bedside  Antibiotics: Zosyn 9/13 >>>  Objective: Blood pressure 144/67, pulse 106, temperature 98.1 F (36.7 C), temperature source Oral, resp. rate 30, height 5\' 11"  (1.803 m), weight 233 lb 11 oz (106 kg), SpO2 97.00%.  Intake/Output Summary (Last 24 hours) at 09/29/13 1300 Last data filed at 09/29/13 0900  Gross per 24 hour  Intake 3417.92 ml  Output   1875 ml  Net 1542.92 ml   Exam: Gen: No acute respiratory distress Chest: Clear to auscultation bilaterally without wheezes, rhonchi or crackles, room air Cardiac: Regular rate and rhythm, S1-S2, no rubs murmurs or gallops, no peripheral edema, no JVD Abdomen: Soft, tender over surgical incision, NG tube in place, nondistended without obvious hepatosplenomegaly, no ascites Extremities: Symmetrical in appearance without cyanosis, clubbing or effusion  Scheduled Meds:  Scheduled Meds: . enoxaparin (LOVENOX) injection  40 mg Subcutaneous Q24H  . insulin aspart  0-15 Units Subcutaneous 6 times per day  . insulin glargine  15 Units Subcutaneous Daily  . piperacillin-tazobactam (ZOSYN)  IV  3.375 g Intravenous 3 times per day  . pneumococcal 23 valent vaccine  0.5 mL Intramuscular Tomorrow-1000   Data Reviewed: Basic Metabolic Panel:  Recent Labs Lab 09/27/13 0811 09/28/13 0610 09/29/13 0254  NA 141 143 148*  K 4.9 4.8 4.3  CL 102 112 114*  CO2 22 21 21   GLUCOSE 243* 213* 177*  BUN 34* 40* 42*  CREATININE 1.51* 1.90* 1.56*  CALCIUM  9.4 7.5* 8.7   Liver Function Tests:  Recent Labs Lab 09/27/13 0811  AST 22  ALT 29  ALKPHOS 106  BILITOT 0.9    PROT 6.9  ALBUMIN 3.9    Recent Labs Lab 09/27/13 0811  LIPASE 8*   CBC:  Recent Labs Lab 09/27/13 0811 09/28/13 0610  WBC 6.8 10.2  NEUTROABS 5.3  --   HGB 13.7 10.6*  HCT 39.4 31.4*  MCV 82.9 85.6  PLT 193 152   CBG:  Recent Labs Lab 09/28/13 1503 09/28/13 2032 09/29/13 0009 09/29/13 0416 09/29/13 0820  GLUCAP 175* 191* 225* 185* 194*    Recent Results (from the past 240 hour(s))  MRSA PCR SCREENING     Status: None   Collection Time    09/27/13  7:51 PM      Result Value Ref Range Status   MRSA by PCR NEGATIVE  NEGATIVE Final   Comment:            The GeneXpert MRSA Assay (FDA     approved for NASAL specimens     only), is one component of a     comprehensive MRSA colonization     surveillance program. It is not     intended to diagnose MRSA     infection nor to guide or     monitor treatment for     MRSA infections.  URINE CULTURE     Status: None   Collection Time    09/28/13 10:34 AM      Result Value Ref Range Status   Specimen Description URINE, CATHETERIZED   Final   Special Requests NONE   Final   Culture  Setup Time     Final   Value: 09/28/2013 14:59     Performed at [facility]     Final   Value: NO GROWTH     Performed at [facility]   Culture     Final   Value: NO GROWTH     Performed at [facility]   Report Status 09/29/2013 FINAL   Final     Studies:  Recent x-ray studies have been reviewed in detail by the Attending Physician  Time spent :  35 mins      Erin Hearing, ANP [facility] Office  [phone] Pager [phone]  On-Call/Text Page:      [email or web address]      password [email or web address]  If 7PM-7AM, please contact night-coverage [email or web address] Password [email or web address] 09/29/2013, 1:00 PM   LOS: 2 days   I have personally examined this patient and reviewed the entire database. I have reviewed the above note, made any necessary editorial changes, and agree with its  content.  Cherene Altes, MD [facility]

## 2013-09-29 NOTE — Progress Notes (Signed)
Ice chips given earlier, now turned to water, noticed patient drinking water.  Explained he is not to drink water.  Angry and states, "Why can't I have what I want". Explained surgery and the need to stay NPO. Told nurse to get away from him.  Parents at bedside and no response from them.  Continue to watch.

## 2013-09-29 NOTE — Progress Notes (Signed)
Agree with above, no need for renal consult, cr better, voiding, will remove foley today,will cont ng today

## 2013-09-29 NOTE — Evaluation (Signed)
Physical Therapy Evaluation Patient Details Name: Alex Morrison MRN: [name] DOB: Sep 15, 1970 Today's Date: 09/29/2013   History of Present Illness  43 yo s/p exploratory lap with ileal colectomy s/p perforated appendix. Currently with worsening renal function.  Clinical Impression  Patient demonstrates deficits in functional mobility as indicated below. Will benefit from continued skilled PT to address deficits and maximize recovery. Encouraged and educated patient on OOB and ambulation. Spoke with patient regarding pain control and safety. Will continue to see and progress as tolerated.     Follow Up Recommendations No PT follow up;Supervision - Intermittent    Equipment Recommendations  None recommended by PT    Recommendations for Other Services       Precautions / Restrictions Precautions Precautions: Fall;Other (comment) (NG tube) Restrictions Weight Bearing Restrictions: No      Mobility  Bed Mobility Overal bed mobility: Needs Assistance Bed Mobility: Rolling;Sidelying to Sit Rolling: Supervision Sidelying to sit: Min assist       General bed mobility comments: limited by pain and "feelings of anxiety"  Transfers Overall transfer level: Needs assistance   Transfers: Sit to/from Stand Sit to Stand: Min guard         General transfer comment: initial guarding for stability, one noted balance check able to self correct  Ambulation/Gait Ambulation/Gait assistance: Min guard Ambulation Distance (Feet): 260 Feet Assistive device:  (pushing IV pole) Gait Pattern/deviations: Step-through pattern;Decreased stride length;Trunk flexed Gait velocity: decreased and cautious Gait velocity interpretation: Below normal speed for age/gender General Gait Details: some initial instability but improved with activity  Stairs            Wheelchair Mobility    Modified Rankin (Stroke Patients Only)       Balance     Sitting balance-Leahy Scale: Fair       Standing balance-Leahy Scale: Fair                               Pertinent Vitals/Pain Pain Assessment: 0-10 Pain Score: 3  Pain Location: abdominal area Pain Descriptors / Indicators: Aching;Grimacing Pain Intervention(s): Limited activity within patient's tolerance;Repositioned    Home Living Family/patient expects to be discharged to:: Private residence Living Arrangements: Parent (lives with his brother) Available Help at Discharge: Available 24 hours/day Type of Home: House Home Access: Stairs to enter Entrance Stairs-Rails: Right Entrance Stairs-Number of Steps: 13 Home Layout: Two level;Able to live on main level with bedroom/bathroom (difficult to understand layout) Home Equipment: None      Prior Function Level of Independence: Independent         Comments: owns copy/print shop     Hand Dominance   Dominant Hand: Right    Extremity/Trunk Assessment   Upper Extremity Assessment: Generalized weakness           Lower Extremity Assessment: Generalized weakness      Cervical / Trunk Assessment: Other exceptions (guarding abdomen)  Communication   Communication: No difficulties  Cognition Arousal/Alertness: Awake/alert Behavior During Therapy: Flat affect Overall Cognitive Status: Within Functional Limits for tasks assessed                      General Comments      Exercises        Assessment/Plan    PT Assessment Patient needs continued PT services  PT Diagnosis Difficulty walking;Abnormality of gait;Generalized weakness;Acute pain   PT Problem List Decreased strength;Decreased activity tolerance;Decreased balance;Decreased mobility;Pain  PT Treatment Interventions DME instruction;Gait training;Stair training;Functional mobility training;Therapeutic activities;Therapeutic exercise;Balance training;Patient/family education   PT Goals (Current goals can be found in the Care Plan section) Acute Rehab PT Goals Patient  Stated Goal: to get better PT Goal Formulation: With patient Time For Goal Achievement: 10/13/13 Potential to Achieve Goals: Good    Frequency Min 3X/week   Barriers to discharge        Co-evaluation               End of Session Equipment Utilized During Treatment: Gait belt Activity Tolerance: Patient tolerated treatment well Patient left: in chair;with call bell/phone within reach;with family/visitor present Nurse Communication: Mobility status         Time: 1103-1130 PT Time Calculation (min): 27 min   Charges:   PT Evaluation $Initial PT Evaluation Tier I: 1 Procedure PT Treatments $Gait Training: 8-22 mins $Therapeutic Activity: 8-22 mins   PT G CodesDuncan Dull 09/29/2013, 12:16 PM Alben Deeds, [place] DPT  [phone]

## 2013-09-29 NOTE — Progress Notes (Signed)
Refused pneumonia vaccine at 1000, will think about it and let me know when he is ready to take.  Father in room and encouraged patient to take vaccine,  still says he will let me know. Foley cath dc'd intact. Patient tolerated without event.

## 2013-09-29 NOTE — Progress Notes (Signed)
Inpatient Diabetes Program Recommendations  AACE/ADA: New Consensus Statement on Inpatient Glycemic Control (2013)  Target Ranges:  Prepandial:   less than 140 mg/dL      Peak postprandial:   less than 180 mg/dL (1-2 hours)      Critically ill patients:  140 - 180 mg/dL   Reason for Assessment:  Results for ETHANIEL, STUVER (MRN [name]) as of 09/29/2013 14:25  Ref. Range 09/28/2013 20:32 09/29/2013 00:09 09/29/2013 04:16 09/29/2013 08:20 09/29/2013 11:12  Glucose-Capillary Latest Range: 70-99 mg/dL 191 (H) 225 (H) 185 (H) 194 (H) 219 (H)   Diabetes history: Type 1 according to H&P Outpatient Diabetes medications: Lantus 25 units daily, Novolog correction Current orders for Inpatient glycemic control:  Lantus 15 units daily, Novolog moderate q 4 hours.  MD please consider increasing Lantus to home dose of 25 units daily.  Thanks, Adah Perl, RN, BC-ADM Inpatient Diabetes Coordinator Pager [phone]

## 2013-09-30 LAB — GLUCOSE, CAPILLARY
GLUCOSE-CAPILLARY: 143 mg/dL — AB (ref 70–99)
GLUCOSE-CAPILLARY: 164 mg/dL — AB (ref 70–99)
GLUCOSE-CAPILLARY: 166 mg/dL — AB (ref 70–99)
GLUCOSE-CAPILLARY: 179 mg/dL — AB (ref 70–99)
GLUCOSE-CAPILLARY: 183 mg/dL — AB (ref 70–99)
Glucose-Capillary: 159 mg/dL — ABNORMAL HIGH (ref 70–99)
Glucose-Capillary: 172 mg/dL — ABNORMAL HIGH (ref 70–99)

## 2013-09-30 LAB — BASIC METABOLIC PANEL
Anion gap: 13 (ref 5–15)
BUN: 39 mg/dL — ABNORMAL HIGH (ref 6–23)
CHLORIDE: 118 meq/L — AB (ref 96–112)
CO2: 23 mEq/L (ref 19–32)
CREATININE: 1.34 mg/dL (ref 0.50–1.35)
Calcium: 8.5 mg/dL (ref 8.4–10.5)
GFR calc Af Amer: 73 mL/min — ABNORMAL LOW (ref >90)
GFR calc non Af Amer: 63 mL/min — ABNORMAL LOW (ref >90)
Glucose, Bld: 138 mg/dL — ABNORMAL HIGH (ref 70–99)
Potassium: 4.3 mEq/L (ref 3.7–5.3)
Sodium: 154 mEq/L — ABNORMAL HIGH (ref 137–147)

## 2013-09-30 LAB — CBC
HCT: 28.1 % — ABNORMAL LOW (ref 39.0–52.0)
HEMOGLOBIN: 9.3 g/dL — AB (ref 13.0–17.0)
MCH: 27.8 pg (ref 26.0–34.0)
MCHC: 33.1 g/dL (ref 30.0–36.0)
MCV: 83.9 fL (ref 78.0–100.0)
Platelets: 209 10*3/uL (ref 150–400)
RBC: 3.35 MIL/uL — ABNORMAL LOW (ref 4.22–5.81)
RDW: 14.5 % (ref 11.5–15.5)
WBC: 11.3 10*3/uL — ABNORMAL HIGH (ref 4.0–10.5)

## 2013-09-30 MED ORDER — DEXTROSE 5 % IV SOLN
INTRAVENOUS | Status: DC
  Administered 2013-09-30 – 2013-10-04 (×6): via INTRAVENOUS

## 2013-09-30 MED ORDER — INSULIN GLARGINE 100 UNIT/ML ~~LOC~~ SOLN
20.0000 [IU] | Freq: Every day | SUBCUTANEOUS | Status: DC
  Administered 2013-09-30 – 2013-10-05 (×6): 20 [IU] via SUBCUTANEOUS
  Filled 2013-09-30 (×6): qty 0.2

## 2013-09-30 MED ORDER — SODIUM CHLORIDE 0.45 % IV SOLN
INTRAVENOUS | Status: DC
  Administered 2013-09-30: 09:00:00 via INTRAVENOUS

## 2013-09-30 NOTE — Progress Notes (Signed)
Occupational Therapy Treatment Patient Details Name: RUFORD IMBURGIA MRN: [name] DOB: Feb 03, 1970 Today's Date: 09/30/2013    History of present illness 43 yo s/p exploratory lap with ileal colectomy s/p perforated appendix. Currently with worsening renal function.   OT comments  Pt moving well. Education covered and no further OT needs.   Follow Up Recommendations  No OT follow up;Supervision - Intermittent    Equipment Recommendations  None recommended by OT    Recommendations for Other Services      Precautions / Restrictions Precautions Precautions: Other (comment) (NG tube) Restrictions Weight Bearing Restrictions: No       Mobility Bed Mobility Overal bed mobility: Modified Independent;Needs Assistance Bed Mobility: Rolling;Sidelying to Sit;Sit to Supine Rolling: Supervision Sidelying to sit: Supervision   Sit to supine: Modified independent (Device/Increase time)   General bed mobility comments: cues for rolling technique as sit to supine hurt stomach  Transfers Overall transfer level: Modified independent Equipment used: None Transfers: Sit to/from Stand Sit to Stand: Modified independent (Device/Increase time)                ADL Overall ADL's : Needs assistance/impaired             Lower Body Bathing: Min guard (standing)       Lower Body Dressing: Modified independent;Sit to/from stand   Toilet Transfer: Modified Independent;Ambulation (chair)       Tub/ Shower Transfer: Supervision/safety;Ambulation   Functional mobility during ADLs: Supervision/safety;Modified independent (supervision for tub transfer) General ADL Comments: Discussed what pt could use for shower chair. Recommended family be with him at least first few times for tub transfer. Recommended sitting for LB bathing/dressing. Pt unstable with simulated LB bathing while standing. Encouraged pt to be walking around. Educated on safety.       Vision                     Perception     Praxis      Cognition   Behavior During Therapy: WFL for tasks assessed/performed Overall Cognitive Status: Within Functional Limits for tasks assessed                       Extremity/Trunk Assessment               Exercises     Shoulder Instructions       General Comments      Pertinent Vitals/ Pain       Pain Assessment: 0-10 Pain Score: 3  Pain Location: abdomen Pain Descriptors / Indicators: Aching Pain Intervention(s): Monitored during session  Home Living                                          Prior Functioning/Environment              Frequency Min 2X/week     Progress Toward Goals  OT Goals(current goals can now be found in the care plan section)  Progress towards OT goals: Progressing toward goals  Acute Rehab OT Goals Patient Stated Goal: not stated OT Goal Formulation: With patient Time For Goal Achievement: 10/12/13 Potential to Achieve Goals: Good ADL Goals Pt Will Perform Lower Body Bathing: with supervision;with adaptive equipment;with caregiver independent in assisting;sit to/from stand Pt Will Perform Lower Body Dressing: with supervision;with caregiver independent in assisting;with adaptive equipment;sit to/from stand Pt Will Transfer to  Toilet: with supervision;ambulating;regular height toilet Pt Will Perform Toileting - Clothing Manipulation and hygiene: with modified independence;sit to/from stand Pt Will Perform Tub/Shower Transfer: Tub transfer;ambulating  Plan Discharge plan remains appropriate    Co-evaluation                 End of Session     Activity Tolerance Patient tolerated treatment well   Patient Left in chair;with call bell/phone within reach   Nurse Communication          Time: [id number] OT Time Calculation (min): 11 min  Charges: OT General Charges $OT Visit: 1 Procedure OT Treatments $Self Care/Home Management : 8-22 mins  Benito Mccreedy OTR/L [id number] 09/30/2013, 12:24 PM

## 2013-09-30 NOTE — Progress Notes (Signed)
Agree with above except clamp ng and remove later if doing well

## 2013-09-30 NOTE — Progress Notes (Signed)
Patient ID: Alex Morrison, male   DOB: 10-26-1970, 43 y.o.   MRN: [id number]     [place]      [address] [place], [place] [address]    Phone: [phone] FAX: [phone]     Subjective: Passing flatus.  964m out of NGT dark, bilious.  VSS.  Afebrile.  Improved sCr.  Na  Objective:  Vital signs:  Filed Vitals:   09/29/13 2023 09/29/13 2024 09/29/13 2155 09/30/13 0549  BP:  142/76 150/86 154/86  Pulse:  101 98 88  Temp: 99.1 F (37.3 C)  98.6 F (37 C) 98.2 F (36.8 C)  TempSrc:   Oral Oral  Resp:  '26 20 24  ' Height:   '5\' 11"'  (1.803 m)   Weight:   231 lb 4.8 oz (104.917 kg)   SpO2:  100% 99% 97%    Last BM Date: 09/25/13  Intake/Output   Yesterday:  09/15 0701 - 09/16 0700 In: 1872.9 [I.V.:1872.9] Out: 1725 [Urine:825; Emesis/NG output:900] This shift:    I/O last 3 completed shifts: In: 3297.9 [I.V.:3247.9; IV Piggyback:50] Out: 2900 [Urine:1525; Emesis/NG oKRCVKF:8403]   Physical Exam: General: Pt awake/alert/oriented x4 in no acute distress Chest: cta. No chest wall pain w good excursion CV:  Pulses intact.  Regular rhythm Abdomen: Soft.  Nondistended.  Midline wound is beefy red, 3-4 staples in place otherwise open, packing replaced.  No evidence of peritonitis.  No incarcerated hernias. Ext:  SCDs BLE.  Trace BLE edema.  No cyanosis Skin: No petechiae / purpura   Problem List:   Active Problems:   Acute gangrenous appendicitis with perforation and peritonitis   Type 1 diabetes mellitus   Acute kidney injury   Psoriasis    Results:   Labs: Results for orders placed during the hospital encounter of 09/27/13 (from the past 48 hour(s))  GLUCOSE, CAPILLARY     Status: Abnormal   Collection Time    09/28/13  9:59 AM      Result Value Ref Range   Glucose-Capillary 200 (*) 70 - 99 mg/dL  CREATININE, URINE, RANDOM     Status: None   Collection Time    09/28/13 10:34 AM      Result Value Ref  Range   Creatinine, Urine 202.46    OSMOLALITY, URINE     Status: None   Collection Time    09/28/13 10:34 AM      Result Value Ref Range   Osmolality, Ur 703  390 - 1090 mOsm/kg   Comment: Performed at [facility] SODIUM, URINE, RANDOM     Status: None   Collection Time    09/28/13 10:34 AM      Result Value Ref Range   Sodium, Ur <20    URINE CULTURE     Status: None   Collection Time    09/28/13 10:34 AM      Result Value Ref Range   Specimen Description URINE, CATHETERIZED     Special Requests NONE     Culture  Setup Time       Value: 09/28/2013 14:59     Performed at [facility]      Value: NO GROWTH     Performed at [facility]  Culture       Value: NO GROWTH     Performed at [facility]  Report Status 09/29/2013 FINAL  URINALYSIS, ROUTINE W REFLEX MICROSCOPIC     Status: Abnormal   Collection Time    09/28/13 10:34 AM      Result Value Ref Range   Color, Urine AMBER (*) YELLOW   Comment: BIOCHEMICALS MAY BE AFFECTED BY COLOR   APPearance CLOUDY (*) CLEAR   Specific Gravity, Urine 1.030  1.005 - 1.030   pH 5.0  5.0 - 8.0   Glucose, UA NEGATIVE  NEGATIVE mg/dL   Hgb urine dipstick MODERATE (*) NEGATIVE   Bilirubin Urine SMALL (*) NEGATIVE   Ketones, ur NEGATIVE  NEGATIVE mg/dL   Protein, ur 100 (*) NEGATIVE mg/dL   Urobilinogen, UA 0.2  0.0 - 1.0 mg/dL   Nitrite NEGATIVE  NEGATIVE   Leukocytes, UA NEGATIVE  NEGATIVE  URINE MICROSCOPIC-ADD ON     Status: None   Collection Time    09/28/13 10:34 AM      Result Value Ref Range   Squamous Epithelial / LPF RARE  RARE   WBC, UA 0-2  <3 WBC/hpf   RBC / HPF 0-2  <3 RBC/hpf   Bacteria, UA RARE  RARE   Urine-Other LESS THAN 10 mL OF URINE SUBMITTED     Comment: AMORPHOUS URATES/PHOSPHATES  OSMOLALITY     Status: Abnormal   Collection Time    09/28/13 11:20 AM      Result Value Ref Range   Osmolality 314 (*) 275 - 300 mOsm/kg   Comment: Performed at  [place], CAPILLARY     Status: Abnormal   Collection Time    09/28/13 11:59 AM      Result Value Ref Range   Glucose-Capillary 179 (*) 70 - 99 mg/dL   Comment 1 Notify RN     Comment 2 Documented in Chart    GLUCOSE, CAPILLARY     Status: Abnormal   Collection Time    09/28/13  3:03 PM      Result Value Ref Range   Glucose-Capillary 175 (*) 70 - 99 mg/dL   Comment 1 Notify RN     Comment 2 Documented in Chart    GLUCOSE, CAPILLARY     Status: Abnormal   Collection Time    09/28/13  8:32 PM      Result Value Ref Range   Glucose-Capillary 191 (*) 70 - 99 mg/dL   Comment 1 Documented in Chart     Comment 2 Notify RN    GLUCOSE, CAPILLARY     Status: Abnormal   Collection Time    09/29/13 12:09 AM      Result Value Ref Range   Glucose-Capillary 225 (*) 70 - 99 mg/dL  BASIC METABOLIC PANEL     Status: Abnormal   Collection Time    09/29/13  2:54 AM      Result Value Ref Range   Sodium 148 (*) 137 - 147 mEq/L   Potassium 4.3  3.7 - 5.3 mEq/L   Chloride 114 (*) 96 - 112 mEq/L   CO2 21  19 - 32 mEq/L   Glucose, Bld 177 (*) 70 - 99 mg/dL   BUN 42 (*) 6 - 23 mg/dL   Creatinine, Ser 1.56 (*) 0.50 - 1.35 mg/dL   Calcium 8.7  8.4 - 10.5 mg/dL   GFR calc non Af Amer 52 (*) >90 mL/min   GFR calc Af Amer 61 (*) >90 mL/min   Comment: (NOTE)     The eGFR has been calculated using the CKD EPI equation.  This calculation has not been validated in all clinical situations.     eGFR's persistently <90 mL/min signify possible Chronic Kidney     Disease.   Anion gap 13  5 - 15  GLUCOSE, CAPILLARY     Status: Abnormal   Collection Time    09/29/13  4:16 AM      Result Value Ref Range   Glucose-Capillary 185 (*) 70 - 99 mg/dL  GLUCOSE, CAPILLARY     Status: Abnormal   Collection Time    09/29/13  8:20 AM      Result Value Ref Range   Glucose-Capillary 194 (*) 70 - 99 mg/dL  GLUCOSE, CAPILLARY     Status: Abnormal   Collection Time    09/29/13 11:12 AM       Result Value Ref Range   Glucose-Capillary 219 (*) 70 - 99 mg/dL  GLUCOSE, CAPILLARY     Status: Abnormal   Collection Time    09/29/13  4:23 PM      Result Value Ref Range   Glucose-Capillary 163 (*) 70 - 99 mg/dL  GLUCOSE, CAPILLARY     Status: Abnormal   Collection Time    09/29/13  8:21 PM      Result Value Ref Range   Glucose-Capillary 157 (*) 70 - 99 mg/dL   Comment 1 Notify RN    GLUCOSE, CAPILLARY     Status: Abnormal   Collection Time    09/30/13 12:14 AM      Result Value Ref Range   Glucose-Capillary 164 (*) 70 - 99 mg/dL  BASIC METABOLIC PANEL     Status: Abnormal   Collection Time    09/30/13  4:02 AM      Result Value Ref Range   Sodium 154 (*) 137 - 147 mEq/L   Potassium 4.3  3.7 - 5.3 mEq/L   Chloride 118 (*) 96 - 112 mEq/L   CO2 23  19 - 32 mEq/L   Glucose, Bld 138 (*) 70 - 99 mg/dL   BUN 39 (*) 6 - 23 mg/dL   Creatinine, Ser 1.34  0.50 - 1.35 mg/dL   Calcium 8.5  8.4 - 10.5 mg/dL   GFR calc non Af Amer 63 (*) >90 mL/min   GFR calc Af Amer 73 (*) >90 mL/min   Comment: (NOTE)     The eGFR has been calculated using the CKD EPI equation.     This calculation has not been validated in all clinical situations.     eGFR's persistently <90 mL/min signify possible Chronic Kidney     Disease.   Anion gap 13  5 - 15  CBC     Status: Abnormal   Collection Time    09/30/13  4:02 AM      Result Value Ref Range   WBC 11.3 (*) 4.0 - 10.5 K/uL   RBC 3.35 (*) 4.22 - 5.81 MIL/uL   Hemoglobin 9.3 (*) 13.0 - 17.0 g/dL   HCT 28.1 (*) 39.0 - 52.0 %   MCV 83.9  78.0 - 100.0 fL   MCH 27.8  26.0 - 34.0 pg   MCHC 33.1  30.0 - 36.0 g/dL   RDW 14.5  11.5 - 15.5 %   Platelets 209  150 - 400 K/uL  GLUCOSE, CAPILLARY     Status: Abnormal   Collection Time    09/30/13  4:04 AM      Result Value Ref Range   Glucose-Capillary 143 (*) 70 - 99  mg/dL  GLUCOSE, CAPILLARY     Status: Abnormal   Collection Time    09/30/13  7:46 AM      Result Value Ref Range   Glucose-Capillary  166 (*) 70 - 99 mg/dL    Imaging / Studies: No results found.  Medications / Allergies:  Scheduled Meds: . enoxaparin (LOVENOX) injection  40 mg Subcutaneous Q24H  . insulin aspart  0-15 Units Subcutaneous 6 times per day  . insulin glargine  20 Units Subcutaneous Daily  . piperacillin-tazobactam (ZOSYN)  IV  3.375 g Intravenous 3 times per day  . pneumococcal 23 valent vaccine  0.5 mL Intramuscular Tomorrow-1000   Continuous Infusions: . sodium chloride     PRN Meds:.acetaminophen, fentaNYL, iohexol, ondansetron  Antibiotics: Anti-infectives   Start     Dose/Rate Route Frequency Ordered Stop   09/27/13 2200  piperacillin-tazobactam (ZOSYN) IVPB 3.375 g     3.375 g 12.5 mL/hr over 240 Minutes Intravenous 3 times per day 09/27/13 1234     09/27/13 1300  piperacillin-tazobactam (ZOSYN) IVPB 3.375 g     3.375 g 100 mL/hr over 30 Minutes Intravenous  Once 09/27/13 1242 09/27/13 1400      Assessment/Plan  Perforated appendix with diffuse severe peritonitis POD #3 s/p diagnostic laparoscopy converted to open Ex Lap with ileocecectomy ---Dr. Brantley Stage -continue NGT, bowel rest and await bowel function -pain control/anti-emetics -zosyn D#4/7 -ambulate/IS -SCD/lovenox -BID wet to dry dressing changes AKI - Cr down to 1.3 today  Hypernatremia-change IVF to D5W after discussing with Dr. Allyson Sabal, repeat labs in AM ABL Anemia - stable Type 1 DM -appreciate IM management.  CBGs are stable   Erby Pian, [facility] Pager [phone]) For consults and floor pages call [phone](7A-4:30P)  09/30/2013 8:33 AM

## 2013-09-30 NOTE — ED Provider Notes (Signed)
Medical screening examination/treatment/procedure(s) were performed by non-physician practitioner and as supervising physician I was immediately available for consultation/collaboration.  Richarda Blade, MD 09/30/13 [phone]

## 2013-09-30 NOTE — Progress Notes (Signed)
[facility] PROGRESS NOTE  Alex Morrison [facility] [id number] DOB: 1970-03-20 DOA: 09/27/2013 PCP: Pcp Not In System  Assessment/Plan: Active Problems:   Acute gangrenous appendicitis with perforation and peritonitis   Type 1 diabetes mellitus   Acute kidney injury   Psoriasis   Brief narrative:  43 y.o. male, with history of type 1 diabetes who is on Lantus and NovoLog with meals, no other known medical problems was brought into the hospital for abdominal pain and was found to have a perforated appendix, he was admitted by General Surgery and underwent exploratoy lap with ileocecectomy.  In the ER he had a CT with IV contrast to determine the etiology of his abdominal pain. In the postop period he was noted to have worsening renal function and hypoglycemia prompting Internal Medicine consultation   Assessment/Plan:  Type 1 diabetes mellitus  CBGs moderately controlled at this time-continue 20 U OF  Lantus with moderate sliding scale insulin-hemoglobin A1c 12.7 which is reflective of very poor glycemic control prior to admission therefore suspect once able to tolerate oral intake and advancement in diet likely insulin dosages will need to be further increased   Acute kidney injury-IMPROVING   Multifactorial related to ATN in setting of sepsis and hypotension in patient with likely underlying diabetic nephropathy at baseline-patient also received IV contrast during the initial diagnostic phase of his admission-CT abdomen and pelvis previously completed revealed no evidence of hydronephrosis so no indication to pursue renal ultrasound at this time-renal function slowly improving-discontinue Foley catheter and monitor response-follow labs , follow BMP  HYPERNATREMIA  Free water deficit  Changed to D5W  Avoid correcting more than 10 meq in 24 hrs   Acute gangrenous appendicitis with perforation and peritonitis  Status post exploratory laparotomy with ileocecostomy-per primary team/general  surgery-currently has postoperative ileus so unable to resume any preadmission oral medications including antihypertensives   Psoriasis  Is stable lesion right knee -was not on immunosuppressive agents prior to admission   Hypertension  Blood pressure moderately controlled at this time and likely influenced by postoperative pain-monitor closely in the event IV antihypertensive agents needs to be added-was on ARB at home   DVT prophylaxis: Lovenox  Code Status: Full  Family Communication: Patient and parents at bedside  Antibiotics:  Zosyn 9/13 >>>     HPI/Subjective: Still has an NGT in place , no pain 946ml out of NGT dark, bilious  Objective: Filed Vitals:   09/29/13 2023 09/29/13 2024 09/29/13 2155 09/30/13 0549  BP:  142/76 150/86 154/86  Pulse:  101 98 88  Temp: 99.1 F (37.3 C)  98.6 F (37 C) 98.2 F (36.8 C)  TempSrc:   Oral Oral  Resp:  26 20 24   Height:   5\' 11"  (1.803 m)   Weight:   104.917 kg (231 lb 4.8 oz)   SpO2:  100% 99% 97%    Intake/Output Summary (Last 24 hours) at 09/30/13 1231 Last data filed at 09/30/13 1215  Gross per 24 hour  Intake   2070 ml  Output   1200 ml  Net    870 ml    Exam:  General: alert & oriented x 3 In NAD  Cardiovascular: RRR, nl S1 s2  Respiratory: Decreased breath sounds at the bases, scattered rhonchi, no crackles  Abdomen: soft +BS NT/ND, no masses palpable  Extremities: No cyanosis and no edema      Data Reviewed: Basic Metabolic Panel:  Recent Labs Lab 09/27/13 0811 09/28/13 0610 09/29/13 0254 09/30/13 0402  NA 141 143 148* 154*  K 4.9 4.8 4.3 4.3  CL 102 112 114* 118*  CO2 22 21 21 23   GLUCOSE 243* 213* 177* 138*  BUN 34* 40* 42* 39*  CREATININE 1.51* 1.90* 1.56* 1.34  CALCIUM 9.4 7.5* 8.7 8.5    Liver Function Tests:  Recent Labs Lab 09/27/13 0811  AST 22  ALT 29  ALKPHOS 106  BILITOT 0.9  PROT 6.9  ALBUMIN 3.9    Recent Labs Lab 09/27/13 0811  LIPASE 8*   No results found for  this basename: AMMONIA,  in the last 168 hours  CBC:  Recent Labs Lab 09/27/13 0811 09/28/13 0610 09/30/13 0402  WBC 6.8 10.2 11.3*  NEUTROABS 5.3  --   --   HGB 13.7 10.6* 9.3*  HCT 39.4 31.4* 28.1*  MCV 82.9 85.6 83.9  PLT 193 152 209    Cardiac Enzymes: No results found for this basename: CKTOTAL, CKMB, CKMBINDEX, TROPONINI,  in the last 168 hours BNP (last 3 results) No results found for this basename: PROBNP,  in the last 8760 hours   CBG:  Recent Labs Lab 09/29/13 2021 09/30/13 0014 09/30/13 0404 09/30/13 0746 09/30/13 1132  GLUCAP 157* 164* 143* 166* 179*    Recent Results (from the past 240 hour(s))  MRSA PCR SCREENING     Status: None   Collection Time    09/27/13  7:51 PM      Result Value Ref Range Status   MRSA by PCR NEGATIVE  NEGATIVE Final   Comment:            The GeneXpert MRSA Assay (FDA     approved for NASAL specimens     only), is one component of a     comprehensive MRSA colonization     surveillance program. It is not     intended to diagnose MRSA     infection nor to guide or     monitor treatment for     MRSA infections.  URINE CULTURE     Status: None   Collection Time    09/28/13 10:34 AM      Result Value Ref Range Status   Specimen Description URINE, CATHETERIZED   Final   Special Requests NONE   Final   Culture  Setup Time     Final   Value: 09/28/2013 14:59     Performed at [facility]     Final   Value: NO GROWTH     Performed at [facility]   Culture     Final   Value: NO GROWTH     Performed at [facility]   Report Status 09/29/2013 FINAL   Final     Studies: Ct Abdomen Pelvis W Contrast  09/27/2013   CLINICAL DATA:  43 year old male with abdominal and pelvic pain, nausea, vomiting and diarrhea.  EXAM: CT ABDOMEN AND PELVIS WITH CONTRAST  TECHNIQUE: Multidetector CT imaging of the abdomen and pelvis was performed using the standard protocol following bolus administration  of intravenous contrast.  CONTRAST:  164mL OMNIPAQUE IOHEXOL 300 MG/ML  SOLN  COMPARISON:  None.  FINDINGS: Pneumoperitoneum is identified, scattered foci of pneumoperitoneum identified compatible with perforated bowel. The source of this perforation is difficult to determine but appears if there is an enlarged irregular appendix and this may represent ruptured appendicitis.  A small to moderate amount of free fluid within the abdomen and pelvis noted as well as diffuse  wall thickening of the majority of the small bowel which is likely reactive.  No dilated bowel loops are identified.  The liver, gallbladder, spleen, adrenal glands, kidneys and pancreas are unremarkable.  The bladder is unremarkable.  There is no evidence of biliary dilatation or abdominal aortic aneurysm.  No acute or suspicious bony abnormalities are identified.  IMPRESSION: Perforated bowel with pneumoperitoneum, small to moderate amount of free fluid within the abdomen/pelvis and wall thickening of the majority of the small bowel, likely reactive. The source of this perforation is difficult to determine but there appears to be in enlarged irregular appendix suspicious for ruptured appendicitis.  Critical Value/emergent results were called by telephone at the time of interpretation on 09/27/2013 at 11:56 am to Dr. Jeannett Senior , who verbally acknowledged these results.   Electronically Signed   By: Hassan Rowan M.D.   On: 09/27/2013 11:58   Dg Abd Acute W/chest  09/27/2013   CLINICAL DATA:  Abdominal pain. Intermittent emesis yesterday. Chronic diarrhea.  EXAM: ACUTE ABDOMEN SERIES (ABDOMEN 2 VIEW & CHEST 1 VIEW)  COMPARISON:  Chest radiograph 07/16/2012  FINDINGS: Heart, mediastinal, and hilar contours are normal. Lung volumes are slightly low on the lungs are clear. Negative for pleural effusion or pneumothorax. Trachea is midline.  The stomach is moderately distended and contains an air-fluid level at the fundus. There is gaseous  distention of the first and second portions of the duodenum, with air-fluid level seen in the second portion of the due to duodenum. There is a paucity of gas distal to the dilated duodenum. No abdominal mass effect. Moderate amount of stool in the visualized portion of the rectum. No acute osseous abnormality.  IMPRESSION: Findings concerning for bowel obstruction at the level of the duodenum.  No acute cardiopulmonary disease.   Electronically Signed   By: Curlene Dolphin M.D.   On: 09/27/2013 10:34    Scheduled Meds: . enoxaparin (LOVENOX) injection  40 mg Subcutaneous Q24H  . insulin aspart  0-15 Units Subcutaneous 6 times per day  . insulin glargine  20 Units Subcutaneous Daily  . piperacillin-tazobactam (ZOSYN)  IV  3.375 g Intravenous 3 times per day   Continuous Infusions: . dextrose 75 mL/hr at 09/30/13 1039    Active Problems:   Acute gangrenous appendicitis with perforation and peritonitis   Type 1 diabetes mellitus   Acute kidney injury   Psoriasis    Time spent: 40 minutes   [facility] Pager [phone]. If 7PM-7AM, please contact night-coverage at [email or web address], password [facility] 09/30/2013, 12:31 PM  LOS: 3 days

## 2013-09-30 NOTE — Progress Notes (Signed)
Physical Therapy Treatment/Discharge Patient Details Name: Alex Morrison MRN: [id number] DOB: 14-Jul-1970 Today's Date: 09/30/2013    History of Present Illness 43 yo s/p exploratory lap with ileal colectomy s/p perforated appendix. Currently with worsening renal function.    PT Comments    Pt is moving much better today.  Not relying on IV pole for gait. Gait speed is good and he showed proficiency on the stairs.  I encouraged OOB in chair most of the day and walks TID (at least) while here in the hospital.  Pt agreeable and has no further PT needs at this time.  PT to sign off.   Follow Up Recommendations  No PT follow up     Equipment Recommendations  None recommended by PT    Recommendations for Other Services   NA     Precautions / Restrictions Precautions Precautions: Other (comment) (NG tube)    Mobility  Bed Mobility               General bed mobility comments: Pt was able to verbalize that he should roll to get up and down out of bed to protect his abdomen.   Transfers Overall transfer level: Needs assistance Equipment used: None Transfers: Sit to/from Stand Sit to Stand: Modified independent (Device/Increase time)         General transfer comment: Pt able to stand easily with the use of his arms.   Ambulation/Gait Ambulation/Gait assistance: Modified independent (Device/Increase time) Ambulation Distance (Feet): 550 Feet Assistive device: None (IV pole) Gait Pattern/deviations: WFL(Within Functional Limits)   Gait velocity interpretation: at or above normal speed for age/gender General Gait Details: Pt with good, steady, fast gait speed.  holding IV pole out of necessity, not really for stability.    Stairs Stairs: Yes Stairs assistance: Modified independent (Device/Increase time) Stair Management: One rail Right;Alternating pattern;Forwards Number of Stairs: 8 (IV pole limited how many we could do at one time) General stair comments: no signs  of weakness.          Balance Overall balance assessment: Modified Independent Sitting-balance support: Feet supported;No upper extremity supported Sitting balance-Leahy Scale: Good     Standing balance support: Single extremity supported;No upper extremity supported Standing balance-Leahy Scale: Good                      Cognition Arousal/Alertness: Awake/alert Behavior During Therapy: WFL for tasks assessed/performed Overall Cognitive Status: Within Functional Limits for tasks assessed                         General Comments General comments (skin integrity, edema, etc.): Encouraged walking at least TID, OOB in chair more than in bed.       Pertinent Vitals/Pain Pain Assessment: 0-10 Pain Score: 2  Pain Location: incision site Pain Descriptors / Indicators: Aching Pain Intervention(s): Limited activity within patient's tolerance;Monitored during session;Repositioned           PT Goals (current goals can now be found in the care plan section) Acute Rehab PT Goals Patient Stated Goal: to get better Progress towards PT goals: Goals met/education completed, patient discharged from PT       PT Plan Other (comment) (d/c from PT)       End of Session   Activity Tolerance: Patient tolerated treatment well Patient left: in chair;with call bell/phone within reach;with family/visitor present     Time: 1021-1030 PT Time Calculation (min): 9 min  Charges:  $  Gait Training: 8-22 mins                     Talena Neira B. [place], [place], DPT [phone]   09/30/2013, 10:37 AM

## 2013-10-01 LAB — BASIC METABOLIC PANEL
ANION GAP: 11 (ref 5–15)
BUN: 35 mg/dL — ABNORMAL HIGH (ref 6–23)
CALCIUM: 8.4 mg/dL (ref 8.4–10.5)
CHLORIDE: 111 meq/L (ref 96–112)
CO2: 27 mEq/L (ref 19–32)
CREATININE: 1.29 mg/dL (ref 0.50–1.35)
GFR, EST AFRICAN AMERICAN: 77 mL/min — AB (ref >90)
GFR, EST NON AFRICAN AMERICAN: 66 mL/min — AB (ref >90)
Glucose, Bld: 146 mg/dL — ABNORMAL HIGH (ref 70–99)
Potassium: 4 mEq/L (ref 3.7–5.3)
Sodium: 149 mEq/L — ABNORMAL HIGH (ref 137–147)

## 2013-10-01 LAB — GLUCOSE, CAPILLARY
Glucose-Capillary: 127 mg/dL — ABNORMAL HIGH (ref 70–99)
Glucose-Capillary: 150 mg/dL — ABNORMAL HIGH (ref 70–99)
Glucose-Capillary: 156 mg/dL — ABNORMAL HIGH (ref 70–99)
Glucose-Capillary: 159 mg/dL — ABNORMAL HIGH (ref 70–99)
Glucose-Capillary: 168 mg/dL — ABNORMAL HIGH (ref 70–99)
Glucose-Capillary: 170 mg/dL — ABNORMAL HIGH (ref 70–99)

## 2013-10-01 MED ORDER — PANTOPRAZOLE SODIUM 40 MG IV SOLR
40.0000 mg | INTRAVENOUS | Status: DC
  Administered 2013-10-01 – 2013-10-03 (×3): 40 mg via INTRAVENOUS
  Filled 2013-10-01 (×4): qty 40

## 2013-10-01 NOTE — Progress Notes (Addendum)
[facility] PROGRESS NOTE  Alex Morrison [facility] [id number] DOB: [birth year] DOA: 09/27/2013 PCP: Pcp Not In System  Assessment/Plan: Active Problems:   Acute gangrenous appendicitis with perforation and peritonitis   Type 1 diabetes mellitus   Acute kidney injury   Psoriasis   Brief narrative:  43 y.o. male, with history of type 1 diabetes who is on Lantus and NovoLog with meals, no other known medical problems was brought into the hospital for abdominal pain and was found to have a perforated appendix, he was admitted by General Surgery and underwent exploratoy lap with ileocecectomy.  In the ER he had a CT with IV contrast to determine the etiology of his abdominal pain. In the postop period he was noted to have worsening renal function and hypoglycemia prompting Internal Medicine consultation   Assessment/Plan:  Type 1 diabetes mellitus  CBGs moderately controlled at this time-continue 20 U OF Lantus with moderate sliding scale insulin-hemoglobin A1c 12.7  Acute kidney injury-IMPROVING  Multifactorial related to ATN in setting of sepsis and hypotension in patient with likely underlying diabetic nephropathy at baseline-patient also received IV contrast during the initial diagnostic phase of his admission-CT abdomen and pelvis previously completed revealed no evidence of hydronephrosis so no indication to pursue renal ultrasound at this time-renal function slowly improving- discontinue Foley catheter and monitor response-follow labs , follow BMP   HYPERNATREMIA  Free water deficit  Changed to D5W  Avoid correcting more than 10 meq in 24 hrs   Acute gangrenous appendicitis with perforation and peritonitis  Status post exploratory laparotomy with ileocecostomy-per primary team/general surgery-currently has postoperative ileus so unable to resume any preadmission oral medications including antihypertensives  Started on PPI today Follow CBC  Psoriasis  Is stable lesion right knee  -was not on immunosuppressive agents prior to admission   Hypertension  Blood pressure moderately controlled at this time and likely influenced by postoperative pain-monitor closely in the event IV antihypertensive agents needs to be added-was on ARB at home   DVT prophylaxis: Lovenox  Code Status: Full  Family Communication: Patient and parents at bedside  Antibiotics:  Zosyn 9/13 >>>     HPI/Subjective: Requesting to remove NG tube NG tube output appears to be coffee ground  Objective: Filed Vitals:   09/30/13 1328 09/30/13 2257 10/01/13 0155 10/01/13 0533  BP: 155/84 141/80 133/87 155/82  Pulse: 86 93 85 82  Temp: 99.1 F (37.3 C) 99.1 F (37.3 C) 97.8 F (36.6 C) 98.4 F (36.9 C)  TempSrc: Oral Oral Oral Oral  Resp: 22 20 16 16   Height:      Weight:      SpO2: 95% 100% 98% 98%    Intake/Output Summary (Last 24 hours) at 10/01/13 1226 Last data filed at 10/01/13 1125  Gross per 24 hour  Intake    950 ml  Output   1775 ml  Net   -825 ml    Exam:  General: alert & oriented x 3 In NAD  Cardiovascular: RRR, nl S1 s2  Respiratory: Decreased breath sounds at the bases, scattered rhonchi, no crackles  Abdomen: wound fairly clean with WTD, +BS, soft. NGT output bilious with coffee ground tinge Extremities: No cyanosis and no edema      Data Reviewed: Basic Metabolic Panel:  Recent Labs Lab 09/27/13 0811 09/28/13 0610 09/29/13 0254 09/30/13 0402 10/01/13 0335  NA 141 143 148* 154* 149*  K 4.9 4.8 4.3 4.3 4.0  CL 102 112 114* 118* 111  CO2 22 21 21  23 27  GLUCOSE 243* 213* 177* 138* 146*  BUN 34* 40* 42* 39* 35*  CREATININE 1.51* 1.90* 1.56* 1.34 1.29  CALCIUM 9.4 7.5* 8.7 8.5 8.4    Liver Function Tests:  Recent Labs Lab 09/27/13 0811  AST 22  ALT 29  ALKPHOS 106  BILITOT 0.9  PROT 6.9  ALBUMIN 3.9    Recent Labs Lab 09/27/13 0811  LIPASE 8*   No results found for this basename: AMMONIA,  in the last 168 hours  CBC:  Recent  Labs Lab 09/27/13 0811 09/28/13 0610 09/30/13 0402  WBC 6.8 10.2 11.3*  NEUTROABS 5.3  --   --   HGB 13.7 10.6* 9.3*  HCT 39.4 31.4* 28.1*  MCV 82.9 85.6 83.9  PLT 193 152 209    Cardiac Enzymes: No results found for this basename: CKTOTAL, CKMB, CKMBINDEX, TROPONINI,  in the last 168 hours BNP (last 3 results) No results found for this basename: PROBNP,  in the last 8760 hours   CBG:  Recent Labs Lab 09/30/13 2038 09/30/13 2338 10/01/13 0437 10/01/13 0754 10/01/13 1204  GLUCAP 172* 159* 150* 159* 170*    Recent Results (from the past 240 hour(s))  MRSA PCR SCREENING     Status: None   Collection Time    09/27/13  7:51 PM      Result Value Ref Range Status   MRSA by PCR NEGATIVE  NEGATIVE Final   Comment:            The GeneXpert MRSA Assay (FDA     approved for NASAL specimens     only), is one component of a     comprehensive MRSA colonization     surveillance program. It is not     intended to diagnose MRSA     infection nor to guide or     monitor treatment for     MRSA infections.  URINE CULTURE     Status: None   Collection Time    09/28/13 10:34 AM      Result Value Ref Range Status   Specimen Description URINE, CATHETERIZED   Final   Special Requests NONE   Final   Culture  Setup Time     Final   Value: 09/28/2013 14:59     Performed at [place]     Final   Value: NO GROWTH     Performed at [facility]   Culture     Final   Value: NO GROWTH     Performed at [facility]   Report Status 09/29/2013 FINAL   Final     Studies: Ct Abdomen Pelvis W Contrast  09/27/2013   CLINICAL DATA:  43 year old male with abdominal and pelvic pain, nausea, vomiting and diarrhea.  EXAM: CT ABDOMEN AND PELVIS WITH CONTRAST  TECHNIQUE: Multidetector CT imaging of the abdomen and pelvis was performed using the standard protocol following bolus administration of intravenous contrast.  CONTRAST:  182mL OMNIPAQUE IOHEXOL 300  MG/ML  SOLN  COMPARISON:  None.  FINDINGS: Pneumoperitoneum is identified, scattered foci of pneumoperitoneum identified compatible with perforated bowel. The source of this perforation is difficult to determine but appears if there is an enlarged irregular appendix and this may represent ruptured appendicitis.  A small to moderate amount of free fluid within the abdomen and pelvis noted as well as diffuse wall thickening of the majority of the small bowel which is likely reactive.  No dilated bowel  loops are identified.  The liver, gallbladder, spleen, adrenal glands, kidneys and pancreas are unremarkable.  The bladder is unremarkable.  There is no evidence of biliary dilatation or abdominal aortic aneurysm.  No acute or suspicious bony abnormalities are identified.  IMPRESSION: Perforated bowel with pneumoperitoneum, small to moderate amount of free fluid within the abdomen/pelvis and wall thickening of the majority of the small bowel, likely reactive. The source of this perforation is difficult to determine but there appears to be in enlarged irregular appendix suspicious for ruptured appendicitis.  Critical Value/emergent results were called by telephone at the time of interpretation on 09/27/2013 at 11:56 am to Dr. Jeannett Senior , who verbally acknowledged these results.   Electronically Signed   By: Hassan Rowan M.D.   On: 09/27/2013 11:58   Dg Abd Acute W/chest  09/27/2013   CLINICAL DATA:  Abdominal pain. Intermittent emesis yesterday. Chronic diarrhea.  EXAM: ACUTE ABDOMEN SERIES (ABDOMEN 2 VIEW & CHEST 1 VIEW)  COMPARISON:  Chest radiograph 07/16/2012  FINDINGS: Heart, mediastinal, and hilar contours are normal. Lung volumes are slightly low on the lungs are clear. Negative for pleural effusion or pneumothorax. Trachea is midline.  The stomach is moderately distended and contains an air-fluid level at the fundus. There is gaseous distention of the first and second portions of the duodenum, with  air-fluid level seen in the second portion of the due to duodenum. There is a paucity of gas distal to the dilated duodenum. No abdominal mass effect. Moderate amount of stool in the visualized portion of the rectum. No acute osseous abnormality.  IMPRESSION: Findings concerning for bowel obstruction at the level of the duodenum.  No acute cardiopulmonary disease.   Electronically Signed   By: Curlene Dolphin M.D.   On: 09/27/2013 10:34    Scheduled Meds: . enoxaparin (LOVENOX) injection  40 mg Subcutaneous Q24H  . insulin aspart  0-15 Units Subcutaneous 6 times per day  . insulin glargine  20 Units Subcutaneous Daily  . piperacillin-tazobactam (ZOSYN)  IV  3.375 g Intravenous 3 times per day   Continuous Infusions: . dextrose 75 mL/hr at 10/01/13 0535    Active Problems:   Acute gangrenous appendicitis with perforation and peritonitis   Type 1 diabetes mellitus   Acute kidney injury   Psoriasis    Time spent: 40 minutes   [place] [facility] Pager [phone]. If 7PM-7AM, please contact night-coverage at [email or web address], password [facility] 10/01/2013, 12:26 PM  LOS: 4 days

## 2013-10-01 NOTE — Progress Notes (Signed)
4 Days Post-Op  Subjective: NGT was clamper overnight. This AM he vomited a bit around the tube and placed himself back on LIWS. Feels better now, mild nausea persists.  Objective: Vital signs in last 24 hours: Temp:  [97.8 F (36.6 C)-99.1 F (37.3 C)] 98.4 F (36.9 C) (09/17 0533) Pulse Rate:  [82-93] 82 (09/17 0533) Resp:  [16-22] 16 (09/17 0533) BP: (133-155)/(80-87) 155/82 mmHg (09/17 0533) SpO2:  [95 %-100 %] 98 % (09/17 0533) Last BM Date: 09/25/13  Intake/Output from previous day: 09/16 0701 - 09/17 0700 In: [id number] [P.O.:720; I.V.:175; NG/GT:600; IV Piggyback:50] Out: 1325 [Urine:1325] Intake/Output this shift: Total I/O In: -  Out: 50 [Emesis/NG output:50]  General appearance: alert and cooperative Resp: clear to auscultation bilaterally Cardio: S1, S2 normal GI: wound fairly clean with WTD, +BS, soft. NGT output bilious with coffee ground tinge.  Lab Results:   Recent Labs  09/30/13 0402  WBC 11.3*  HGB 9.3*  HCT 28.1*  PLT 209   BMET  Recent Labs  09/30/13 0402 10/01/13 0335  NA 154* 149*  K 4.3 4.0  CL 118* 111  CO2 23 27  GLUCOSE 138* 146*  BUN 39* 35*  CREATININE 1.34 1.29  CALCIUM 8.5 8.4   PT/INR No results found for this basename: LABPROT, INR,  in the last 72 hours ABG No results found for this basename: PHART, PCO2, PO2, HCO3,  in the last 72 hours  Studies/Results: No results found.  Anti-infectives: Anti-infectives   Start     Dose/Rate Route Frequency Ordered Stop   09/27/13 2200  piperacillin-tazobactam (ZOSYN) IVPB 3.375 g     3.375 g 12.5 mL/hr over 240 Minutes Intravenous 3 times per day 09/27/13 1234     09/27/13 1300  piperacillin-tazobactam (ZOSYN) IVPB 3.375 g     3.375 g 100 mL/hr over 30 Minutes Intravenous  Once 09/27/13 1242 09/27/13 1400      Assessment/Plan: s/p Procedure(s): LAPAROSCOPY DIAGNOSTIC (N/A) LAPAROSCOPIC ABDOMINAL EXPLORATION: INTERNAL ILIEUM, CECUM, RIGHT COLON (N/A) POD#4 Ileus - despite  flatus and BS, leave NGT on LIWS today. Add Protonix in light of character of NGT drainage. ID - Zosyn VTE - Lovenox FEN - hypernatremia improving, labs in AM I also spoke with his mother  LOS: 4 days    Sokha Craker E 10/01/2013

## 2013-10-02 LAB — CBC
HEMATOCRIT: 27 % — AB (ref 39.0–52.0)
Hemoglobin: 8.8 g/dL — ABNORMAL LOW (ref 13.0–17.0)
MCH: 27.8 pg (ref 26.0–34.0)
MCHC: 32.6 g/dL (ref 30.0–36.0)
MCV: 85.2 fL (ref 78.0–100.0)
Platelets: 174 10*3/uL (ref 150–400)
RBC: 3.17 MIL/uL — AB (ref 4.22–5.81)
RDW: 14.5 % (ref 11.5–15.5)
WBC: 7.3 10*3/uL (ref 4.0–10.5)

## 2013-10-02 LAB — BASIC METABOLIC PANEL
Anion gap: 11 (ref 5–15)
BUN: 23 mg/dL (ref 6–23)
CALCIUM: 8.1 mg/dL — AB (ref 8.4–10.5)
CO2: 28 mEq/L (ref 19–32)
Chloride: 106 mEq/L (ref 96–112)
Creatinine, Ser: 1.04 mg/dL (ref 0.50–1.35)
GFR calc Af Amer: >90 mL/min (ref >90)
GFR, EST NON AFRICAN AMERICAN: 86 mL/min — AB (ref >90)
Glucose, Bld: 161 mg/dL — ABNORMAL HIGH (ref 70–99)
Potassium: 3.7 mEq/L (ref 3.7–5.3)
Sodium: 145 mEq/L (ref 137–147)

## 2013-10-02 LAB — GLUCOSE, CAPILLARY
GLUCOSE-CAPILLARY: 157 mg/dL — AB (ref 70–99)
Glucose-Capillary: 109 mg/dL — ABNORMAL HIGH (ref 70–99)
Glucose-Capillary: 146 mg/dL — ABNORMAL HIGH (ref 70–99)
Glucose-Capillary: 153 mg/dL — ABNORMAL HIGH (ref 70–99)
Glucose-Capillary: 165 mg/dL — ABNORMAL HIGH (ref 70–99)
Glucose-Capillary: 185 mg/dL — ABNORMAL HIGH (ref 70–99)

## 2013-10-02 NOTE — Progress Notes (Signed)
Patient ID: Alex Morrison, male   DOB: 03/03/1970, 43 y.o.   MRN: [name] 5 Days Post-Op  Subjective: Pt passing flatus, ready to get NGT out.  No other c/o  Objective: Vital signs in last 24 hours: Temp:  [98.1 F (36.7 C)-98.7 F (37.1 C)] 98.1 F (36.7 C) (09/18 0523) Pulse Rate:  [78-84] 78 (09/18 0523) Resp:  [18] 18 (09/18 0523) BP: (144-159)/(76-82) 159/82 mmHg (09/18 0523) SpO2:  [97 %-98 %] 97 % (09/18 0523) Last BM Date: 09/29/13  Intake/Output from previous day: 09/17 0701 - 09/18 0700 In: 1450 [I.V.:600; NG/GT:650; IV Piggyback:200] Out: 2525 [Urine:1125; Emesis/NG output:1400] Intake/Output this shift: Total I/O In: -  Out: 300 [Emesis/NG output:300]  PE: Abd: soft, few BS, wound is mostly open and clean, packing in place.  Few staples remain in place as well. ND  Lab Results:   Recent Labs  09/30/13 0402 10/02/13 0447  WBC 11.3* 7.3  HGB 9.3* 8.8*  HCT 28.1* 27.0*  PLT 209 174   BMET  Recent Labs  10/01/13 0335 10/02/13 0447  NA 149* 145  K 4.0 3.7  CL 111 106  CO2 27 28  GLUCOSE 146* 161*  BUN 35* 23  CREATININE 1.29 1.04  CALCIUM 8.4 8.1*   PT/INR No results found for this basename: LABPROT, INR,  in the last 72 hours CMP     Component Value Date/Time   NA 145 10/02/2013 0447   K 3.7 10/02/2013 0447   CL 106 10/02/2013 0447   CO2 28 10/02/2013 0447   GLUCOSE 161* 10/02/2013 0447   BUN 23 10/02/2013 0447   CREATININE 1.04 10/02/2013 0447   CALCIUM 8.1* 10/02/2013 0447   PROT 6.9 09/27/2013 0811   ALBUMIN 3.9 09/27/2013 0811   AST 22 09/27/2013 0811   ALT 29 09/27/2013 0811   ALKPHOS 106 09/27/2013 0811   BILITOT 0.9 09/27/2013 0811   GFRNONAA 86* 10/02/2013 0447   GFRAA >90 10/02/2013 0447   Lipase     Component Value Date/Time   LIPASE 8* 09/27/2013 0811       Studies/Results: No results found.  Anti-infectives: Anti-infectives   Start     Dose/Rate Route Frequency Ordered Stop   09/27/13 2200  piperacillin-tazobactam  (ZOSYN) IVPB 3.375 g     3.375 g 12.5 mL/hr over 240 Minutes Intravenous 3 times per day 09/27/13 1234     09/27/13 1300  piperacillin-tazobactam (ZOSYN) IVPB 3.375 g     3.375 g 100 mL/hr over 30 Minutes Intravenous  Once 09/27/13 1242 09/27/13 1400       Assessment/Plan   LAPAROSCOPY DIAGNOSTIC (N/A)  LAPAROSCOPIC ABDOMINAL EXPLORATION: INTERNAL ILIEUM, CECUM, RIGHT COLON (N/A)  POD#5  Ileus - flatus today, will retry clamping trial, dc NGT if no nausea or vomiting after 6 hours ID - Zosyn D6/7 (?) VTE - Lovenox  FEN - hypernatremia resolved DM, per medicine, appreciate their assistance   LOS: 5 days    Yon Schiffman E 10/02/2013, 9:36 AM Pager: [phone]

## 2013-10-02 NOTE — Progress Notes (Addendum)
[facility] PROGRESS NOTE  Alex Morrison [facility] [id number] DOB: [birth year] DOA: 09/27/2013 PCP: Pcp Not In System  Assessment/Plan: Active Problems:   Acute gangrenous appendicitis with perforation and peritonitis   Type 1 diabetes mellitus   Acute kidney injury   Psoriasis   Brief narrative:  43 y.o. male, with history of type 1 diabetes who is on Lantus and NovoLog with meals, no other known medical problems was brought into the hospital for abdominal pain and was found to have a perforated appendix, he was admitted by General Surgery and underwent exploratoy lap with ileocecectomy.  In the ER he had a CT with IV contrast to determine the etiology of his abdominal pain. In the postop period he was noted to have worsening renal function and hypoglycemia prompting Internal Medicine consultation   Assessment/Plan:  Type 1 diabetes mellitus  CBGs moderately controlled at this time-continue 20 U OF Lantus with moderate sliding scale insulin-hemoglobin A1c 12.7   Acute kidney injury-IMPROVING  Multifactorial related to ATN in setting of sepsis and hypotension in patient with likely underlying diabetic nephropathy at baseline-patient also received IV contrast during the initial diagnostic phase of his admission-CT abdomen and pelvis previously completed revealed no evidence of hydronephrosis so no indication to pursue renal ultrasound at this time-renal function slowly improving-  discontinue Foley catheter and monitor response-follow labs , follow BMP   HYPERNATREMIA  Free water deficit  Change D5W  To half-normal saline in the morning Avoid correcting more than 10 meq in 24 hrs   Acute gangrenous appendicitis with perforation and peritonitis  Status post exploratory laparotomy with ileocecostomy-per primary team/general surgery-currently has postoperative ileus so unable to resume any preadmission oral medications including antihypertensives  Started on PPI  Follow CBC   Psoriasis   Is stable lesion right knee -was not on immunosuppressive agents prior to admission   Hypertension  Blood pressure moderately controlled at this time and likely influenced by postoperative pain-monitor closely in the event IV antihypertensive agents needs to be added-was on ARB at home   DVT prophylaxis: Lovenox  Code Status: Full  Family Communication: Patient and parents at bedside    Antibiotics:  Zosyn 9/13 >>>       HPI/Subjective: Appears comfortable ambulating the halls  Objective: Filed Vitals:   10/01/13 0155 10/01/13 0533 10/01/13 2241 10/02/13 0523  BP: 133/87 155/82 144/76 159/82  Pulse: 85 82 84 78  Temp: 97.8 F (36.6 C) 98.4 F (36.9 C) 98.7 F (37.1 C) 98.1 F (36.7 C)  TempSrc: Oral Oral Oral Oral  Resp: 16 16 18 18   Height:      Weight:      SpO2: 98% 98% 98% 97%    Intake/Output Summary (Last 24 hours) at 10/02/13 1106 Last data filed at 10/02/13 1035  Gross per 24 hour  Intake   1570 ml  Output   2975 ml  Net  -1405 ml    Exam:  General: alert & oriented x 3 In NAD  Cardiovascular: RRR, nl S1 s2  Respiratory: Decreased breath sounds at the bases, scattered rhonchi, no crackles  Abdomen: soft +BS NT/ND, no masses palpable  Extremities: No cyanosis and no edema      Data Reviewed: Basic Metabolic Panel:  Recent Labs Lab 09/28/13 0610 09/29/13 0254 09/30/13 0402 10/01/13 0335 10/02/13 0447  NA 143 148* 154* 149* 145  K 4.8 4.3 4.3 4.0 3.7  CL 112 114* 118* 111 106  CO2 21 21 23 27 28   GLUCOSE 213*  177* 138* 146* 161*  BUN 40* 42* 39* 35* 23  CREATININE 1.90* 1.56* 1.34 1.29 1.04  CALCIUM 7.5* 8.7 8.5 8.4 8.1*    Liver Function Tests:  Recent Labs Lab 09/27/13 0811  AST 22  ALT 29  ALKPHOS 106  BILITOT 0.9  PROT 6.9  ALBUMIN 3.9    Recent Labs Lab 09/27/13 0811  LIPASE 8*   No results found for this basename: AMMONIA,  in the last 168 hours  CBC:  Recent Labs Lab 09/27/13 0811 09/28/13 0610  09/30/13 0402 10/02/13 0447  WBC 6.8 10.2 11.3* 7.3  NEUTROABS 5.3  --   --   --   HGB 13.7 10.6* 9.3* 8.8*  HCT 39.4 31.4* 28.1* 27.0*  MCV 82.9 85.6 83.9 85.2  PLT 193 152 209 174    Cardiac Enzymes: No results found for this basename: CKTOTAL, CKMB, CKMBINDEX, TROPONINI,  in the last 168 hours BNP (last 3 results) No results found for this basename: PROBNP,  in the last 8760 hours   CBG:  Recent Labs Lab 10/01/13 1709 10/01/13 1946 10/01/13 2337 10/02/13 0408 10/02/13 0712  GLUCAP 156* 168* 127* 153* 146*    Recent Results (from the past 240 hour(s))  MRSA PCR SCREENING     Status: None   Collection Time    09/27/13  7:51 PM      Result Value Ref Range Status   MRSA by PCR NEGATIVE  NEGATIVE Final   Comment:            The GeneXpert MRSA Assay (FDA     approved for NASAL specimens     only), is one component of a     comprehensive MRSA colonization     surveillance program. It is not     intended to diagnose MRSA     infection nor to guide or     monitor treatment for     MRSA infections.  URINE CULTURE     Status: None   Collection Time    09/28/13 10:34 AM      Result Value Ref Range Status   Specimen Description URINE, CATHETERIZED   Final   Special Requests NONE   Final   Culture  Setup Time     Final   Value: 09/28/2013 14:59     Performed at [place]     Final   Value: NO GROWTH     Performed at [facility]   Culture     Final   Value: NO GROWTH     Performed at [facility]   Report Status 09/29/2013 FINAL   Final     Studies: Ct Abdomen Pelvis W Contrast  09/27/2013   CLINICAL DATA:  43 year old male with abdominal and pelvic pain, nausea, vomiting and diarrhea.  EXAM: CT ABDOMEN AND PELVIS WITH CONTRAST  TECHNIQUE: Multidetector CT imaging of the abdomen and pelvis was performed using the standard protocol following bolus administration of intravenous contrast.  CONTRAST:  120mL OMNIPAQUE IOHEXOL  300 MG/ML  SOLN  COMPARISON:  None.  FINDINGS: Pneumoperitoneum is identified, scattered foci of pneumoperitoneum identified compatible with perforated bowel. The source of this perforation is difficult to determine but appears if there is an enlarged irregular appendix and this may represent ruptured appendicitis.  A small to moderate amount of free fluid within the abdomen and pelvis noted as well as diffuse wall thickening of the majority of the small bowel which is likely  reactive.  No dilated bowel loops are identified.  The liver, gallbladder, spleen, adrenal glands, kidneys and pancreas are unremarkable.  The bladder is unremarkable.  There is no evidence of biliary dilatation or abdominal aortic aneurysm.  No acute or suspicious bony abnormalities are identified.  IMPRESSION: Perforated bowel with pneumoperitoneum, small to moderate amount of free fluid within the abdomen/pelvis and wall thickening of the majority of the small bowel, likely reactive. The source of this perforation is difficult to determine but there appears to be in enlarged irregular appendix suspicious for ruptured appendicitis.  Critical Value/emergent results were called by telephone at the time of interpretation on 09/27/2013 at 11:56 am to Dr. Jeannett Senior , who verbally acknowledged these results.   Electronically Signed   By: Hassan Rowan M.D.   On: 09/27/2013 11:58   Dg Abd Acute W/chest  09/27/2013   CLINICAL DATA:  Abdominal pain. Intermittent emesis yesterday. Chronic diarrhea.  EXAM: ACUTE ABDOMEN SERIES (ABDOMEN 2 VIEW & CHEST 1 VIEW)  COMPARISON:  Chest radiograph 07/16/2012  FINDINGS: Heart, mediastinal, and hilar contours are normal. Lung volumes are slightly low on the lungs are clear. Negative for pleural effusion or pneumothorax. Trachea is midline.  The stomach is moderately distended and contains an air-fluid level at the fundus. There is gaseous distention of the first and second portions of the duodenum, with  air-fluid level seen in the second portion of the due to duodenum. There is a paucity of gas distal to the dilated duodenum. No abdominal mass effect. Moderate amount of stool in the visualized portion of the rectum. No acute osseous abnormality.  IMPRESSION: Findings concerning for bowel obstruction at the level of the duodenum.  No acute cardiopulmonary disease.   Electronically Signed   By: Curlene Dolphin M.D.   On: 09/27/2013 10:34    Scheduled Meds: . enoxaparin (LOVENOX) injection  40 mg Subcutaneous Q24H  . insulin aspart  0-15 Units Subcutaneous 6 times per day  . insulin glargine  20 Units Subcutaneous Daily  . pantoprazole (PROTONIX) IV  40 mg Intravenous Q24H  . piperacillin-tazobactam (ZOSYN)  IV  3.375 g Intravenous 3 times per day   Continuous Infusions: . dextrose 75 mL/hr at 10/02/13 1037    Active Problems:   Acute gangrenous appendicitis with perforation and peritonitis   Type 1 diabetes mellitus   Acute kidney injury   Psoriasis    Time spent: 40 minutes   [place] [facility] Pager [phone]. If 7PM-7AM, please contact night-coverage at [email or web address], password [facility] 10/02/2013, 11:06 AM  LOS: 5 days

## 2013-10-02 NOTE — Progress Notes (Signed)
Seen, agree with above.  Passing gas.

## 2013-10-03 LAB — GLUCOSE, CAPILLARY
GLUCOSE-CAPILLARY: 125 mg/dL — AB (ref 70–99)
GLUCOSE-CAPILLARY: 163 mg/dL — AB (ref 70–99)
Glucose-Capillary: 136 mg/dL — ABNORMAL HIGH (ref 70–99)
Glucose-Capillary: 201 mg/dL — ABNORMAL HIGH (ref 70–99)
Glucose-Capillary: 181 mg/dL — ABNORMAL HIGH (ref 70–99)

## 2013-10-03 NOTE — Progress Notes (Signed)
[facility] PROGRESS NOTE  Alex Morrison [facility] [id number] DOB: 13-Sep-1970 DOA: 09/27/2013 PCP: Pcp Not In System  Assessment/Plan: Active Problems:   Acute gangrenous appendicitis with perforation and peritonitis   Type 1 diabetes mellitus   Acute kidney injury   Psoriasis   Brief narrative:  43 y.o. male, with history of type 1 diabetes who is on Lantus and NovoLog with meals, no other known medical problems was brought into the hospital for abdominal pain and was found to have a perforated appendix, he was admitted by General Surgery and underwent exploratoy lap with ileocecectomy.  In the ER he had a CT with IV contrast to determine the etiology of his abdominal pain. In the postop period he was noted to have worsening renal function and hypoglycemia prompting Internal Medicine consultation   Assessment/Plan:  Type 1 diabetes mellitus  CBGs moderately controlled at this time-continue 20 U OF Lantus with moderate sliding scale insulin-hemoglobin A1c 12.7  Increase lantus as diet advances   Acute kidney injury-IMPROVING  Multifactorial related to ATN in setting of sepsis and hypotension in patient with likely underlying diabetic nephropathy at baseline-patient also received IV contrast during the initial diagnostic phase of his admission-CT abdomen and pelvis previously completed revealed no evidence of hydronephrosis so no indication to pursue renal ultrasound at this time-renal function slowly improving-  discontinue Foley catheter and monitor response-follow labs , follow BMP   HYPERNATREMIA  Free water deficit  Change D5W To half-normal saline  Vs DC fluids if patient eating  Avoid correcting more than 10 meq in 24 hrs   Acute gangrenous appendicitis with perforation and peritonitis  Status post exploratory laparotomy with ileocecostomy-per primary team/general surgery-currently has postoperative ileus so unable to resume any preadmission oral medications including  antihypertensives  Started on PPI  Follow CBC   Psoriasis  Is stable lesion right knee -was not on immunosuppressive agents prior to admission   Hypertension  Blood pressure moderately controlled at this time and likely influenced by postoperative pain-monitor closely in the event IV antihypertensive agents needs to be added-was on ARB at home  DVT prophylaxis: Lovenox  Code Status: Full  Family Communication: Patient and parents at bedside   Patient medically stable, [facility] will sign off. Please call us back if any issues  Antibiotics:  Zosyn 9/13 >>>          HPI/Subjective: Denies nausea. Says pain is minimal. Passing flatus and had a stool yesterday   Objective: Filed Vitals:   10/02/13 0523 10/02/13 1352 10/02/13 2217 10/03/13 0606  BP: 159/82 136/84 148/79 152/79  Pulse: 78 85 90 88  Temp: 98.1 F (36.7 C) 98.2 F (36.8 C) 98.6 F (37 C) 98.2 F (36.8 C)  TempSrc: Oral Oral Oral Oral  Resp: 18 18 18 16   Height:      Weight:      SpO2: 97% 99% 97% 96%    Intake/Output Summary (Last 24 hours) at 10/03/13 1011 Last data filed at 10/03/13 0610  Gross per 24 hour  Intake   1500 ml  Output   1325 ml  Net    175 ml    Exam:  General: alert & oriented x 3 In NAD  Cardiovascular: RRR, nl S1 s2  Respiratory: Decreased breath sounds at the bases, scattered rhonchi, no crackles  Abdomen: soft +BS NT/ND, no masses palpable  Extremities: No cyanosis and no edema      Data Reviewed: Basic Metabolic Panel:  Recent Labs Lab 09/28/13 0610 09/29/13 0254  09/30/13 0402 10/01/13 0335 10/02/13 0447  NA 143 148* 154* 149* 145  K 4.8 4.3 4.3 4.0 3.7  CL 112 114* 118* 111 106  CO2 21 21 23 27 28   GLUCOSE 213* 177* 138* 146* 161*  BUN 40* 42* 39* 35* 23  CREATININE 1.90* 1.56* 1.34 1.29 1.04  CALCIUM 7.5* 8.7 8.5 8.4 8.1*    Liver Function Tests:  Recent Labs Lab 09/27/13 0811  AST 22  ALT 29  ALKPHOS 106  BILITOT 0.9  PROT 6.9  ALBUMIN 3.9     Recent Labs Lab 09/27/13 0811  LIPASE 8*   No results found for this basename: AMMONIA,  in the last 168 hours  CBC:  Recent Labs Lab 09/27/13 0811 09/28/13 0610 09/30/13 0402 10/02/13 0447  WBC 6.8 10.2 11.3* 7.3  NEUTROABS 5.3  --   --   --   HGB 13.7 10.6* 9.3* 8.8*  HCT 39.4 31.4* 28.1* 27.0*  MCV 82.9 85.6 83.9 85.2  PLT 193 152 209 174    Cardiac Enzymes: No results found for this basename: CKTOTAL, CKMB, CKMBINDEX, TROPONINI,  in the last 168 hours BNP (last 3 results) No results found for this basename: PROBNP,  in the last 8760 hours   CBG:  Recent Labs Lab 10/02/13 1154 10/02/13 1642 10/02/13 1956 10/02/13 2335 10/03/13 0607  GLUCAP 185* 157* 165* 109* 136*    Recent Results (from the past 240 hour(s))  MRSA PCR SCREENING     Status: None   Collection Time    09/27/13  7:51 PM      Result Value Ref Range Status   MRSA by PCR NEGATIVE  NEGATIVE Final   Comment:            The GeneXpert MRSA Assay (FDA     approved for NASAL specimens     only), is one component of a     comprehensive MRSA colonization     surveillance program. It is not     intended to diagnose MRSA     infection nor to guide or     monitor treatment for     MRSA infections.  URINE CULTURE     Status: None   Collection Time    09/28/13 10:34 AM      Result Value Ref Range Status   Specimen Description URINE, CATHETERIZED   Final   Special Requests NONE   Final   Culture  Setup Time     Final   Value: 09/28/2013 14:59     Performed at [place]     Final   Value: NO GROWTH     Performed at [facility]   Culture     Final   Value: NO GROWTH     Performed at [facility]   Report Status 09/29/2013 FINAL   Final     Studies: Ct Abdomen Pelvis W Contrast  09/27/2013   CLINICAL DATA:  43 year old male with abdominal and pelvic pain, nausea, vomiting and diarrhea.  EXAM: CT ABDOMEN AND PELVIS WITH CONTRAST  TECHNIQUE:  Multidetector CT imaging of the abdomen and pelvis was performed using the standard protocol following bolus administration of intravenous contrast.  CONTRAST:  120mL OMNIPAQUE IOHEXOL 300 MG/ML  SOLN  COMPARISON:  None.  FINDINGS: Pneumoperitoneum is identified, scattered foci of pneumoperitoneum identified compatible with perforated bowel. The source of this perforation is difficult to determine but appears if there is an enlarged irregular appendix  and this may represent ruptured appendicitis.  A small to moderate amount of free fluid within the abdomen and pelvis noted as well as diffuse wall thickening of the majority of the small bowel which is likely reactive.  No dilated bowel loops are identified.  The liver, gallbladder, spleen, adrenal glands, kidneys and pancreas are unremarkable.  The bladder is unremarkable.  There is no evidence of biliary dilatation or abdominal aortic aneurysm.  No acute or suspicious bony abnormalities are identified.  IMPRESSION: Perforated bowel with pneumoperitoneum, small to moderate amount of free fluid within the abdomen/pelvis and wall thickening of the majority of the small bowel, likely reactive. The source of this perforation is difficult to determine but there appears to be in enlarged irregular appendix suspicious for ruptured appendicitis.  Critical Value/emergent results were called by telephone at the time of interpretation on 09/27/2013 at 11:56 am to Dr. Jeannett Senior , who verbally acknowledged these results.   Electronically Signed   By: Hassan Rowan M.D.   On: 09/27/2013 11:58   Dg Abd Acute W/chest  09/27/2013   CLINICAL DATA:  Abdominal pain. Intermittent emesis yesterday. Chronic diarrhea.  EXAM: ACUTE ABDOMEN SERIES (ABDOMEN 2 VIEW & CHEST 1 VIEW)  COMPARISON:  Chest radiograph 07/16/2012  FINDINGS: Heart, mediastinal, and hilar contours are normal. Lung volumes are slightly low on the lungs are clear. Negative for pleural effusion or pneumothorax.  Trachea is midline.  The stomach is moderately distended and contains an air-fluid level at the fundus. There is gaseous distention of the first and second portions of the duodenum, with air-fluid level seen in the second portion of the due to duodenum. There is a paucity of gas distal to the dilated duodenum. No abdominal mass effect. Moderate amount of stool in the visualized portion of the rectum. No acute osseous abnormality.  IMPRESSION: Findings concerning for bowel obstruction at the level of the duodenum.  No acute cardiopulmonary disease.   Electronically Signed   By: Curlene Dolphin M.D.   On: 09/27/2013 10:34    Scheduled Meds: . enoxaparin (LOVENOX) injection  40 mg Subcutaneous Q24H  . insulin aspart  0-15 Units Subcutaneous 6 times per day  . insulin glargine  20 Units Subcutaneous Daily  . pantoprazole (PROTONIX) IV  40 mg Intravenous Q24H  . piperacillin-tazobactam (ZOSYN)  IV  3.375 g Intravenous 3 times per day   Continuous Infusions: . dextrose 75 mL/hr at 10/02/13 1037    Active Problems:   Acute gangrenous appendicitis with perforation and peritonitis   Type 1 diabetes mellitus   Acute kidney injury   Psoriasis    Time spent: 40 minutes   [facility] Pager [phone]. If 7PM-7AM, please contact night-coverage at [email or web address], password [facility] 10/03/2013, 10:11 AM  LOS: 6 days

## 2013-10-03 NOTE — Progress Notes (Signed)
6 Days Post-Op  Subjective: Stable and alert. Afebrile. Denies nausea. Says pain is minimal. Passing flatus and had a stool yesterday. Says he feels like food is caught up in his lower abdomen and did not pass through last night. On clear liquid diet.  CBGs looked good. Less than 180 most of the time.On Lantus insulin 20 units daily plus sliding-scale insulin every 4 hours.  Objective: Vital signs in last 24 hours: Temp:  [98.2 F (36.8 C)-98.6 F (37 C)] 98.2 F (36.8 C) (09/19 0606) Pulse Rate:  [85-90] 88 (09/19 0606) Resp:  [16-18] 16 (09/19 0606) BP: (136-152)/(79-84) 152/79 mmHg (09/19 0606) SpO2:  [96 %-99 %] 96 % (09/19 0606) Last BM Date: 10/01/13  Intake/Output from previous day: 09/18 0701 - 09/19 0700 In: 1620 [P.O.:720; I.V.:800; NG/GT:100] Out: 1625 [Urine:1125; Emesis/NG output:500] Intake/Output this shift: Total I/O In: -  Out: 425 [Urine:425]  General appearance: alert Cooperative. Very flattened affect. TNo physical distress. Does not look sick or toxic. Resp: clear to auscultation bilaterally GI: abdominal exam is actually good. Soft. Flat. Nondistended. Active bowel sounds. No unusual tenderness. Midline incision open and clean. Repacked this morning.  Lab Results:  Results for orders placed during the hospital encounter of 09/27/13 (from the past 24 hour(s))  GLUCOSE, CAPILLARY     Status: Abnormal   Collection Time    10/02/13  7:12 AM      Result Value Ref Range   Glucose-Capillary 146 (*) 70 - 99 mg/dL  GLUCOSE, CAPILLARY     Status: Abnormal   Collection Time    10/02/13 11:54 AM      Result Value Ref Range   Glucose-Capillary 185 (*) 70 - 99 mg/dL  GLUCOSE, CAPILLARY     Status: Abnormal   Collection Time    10/02/13  4:42 PM      Result Value Ref Range   Glucose-Capillary 157 (*) 70 - 99 mg/dL  GLUCOSE, CAPILLARY     Status: Abnormal   Collection Time    10/02/13  7:56 PM      Result Value Ref Range   Glucose-Capillary 165 (*) 70 - 99  mg/dL   Comment 1 Notify RN    GLUCOSE, CAPILLARY     Status: Abnormal   Collection Time    10/02/13 11:35 PM      Result Value Ref Range   Glucose-Capillary 109 (*) 70 - 99 mg/dL   Comment 1 Notify RN    GLUCOSE, CAPILLARY     Status: Abnormal   Collection Time    10/03/13  6:07 AM      Result Value Ref Range   Glucose-Capillary 136 (*) 70 - 99 mg/dL   Comment 1 Notify RN       Studies/Results: No results found.  . enoxaparin (LOVENOX) injection  40 mg Subcutaneous Q24H  . insulin aspart  0-15 Units Subcutaneous 6 times per day  . insulin glargine  20 Units Subcutaneous Daily  . pantoprazole (PROTONIX) IV  40 mg Intravenous Q24H  . piperacillin-tazobactam (ZOSYN)  IV  3.375 g Intravenous 3 times per day     Assessment/Plan: s/p Procedure(s): LAPAROSCOPY DIAGNOSTIC LAPAROSCOPIC ABDOMINAL EXPLORATION: INTERNAL ILIEUM, CECUM, RIGHT COLON  POD#6. Ileocecectomy for perforated appendicitis with diffuse peritonitis and pneumoperitoneum Stable. Try full liquids. Continue IV Zosyn(D#7)-?dc tomorrow if WBC remains normal.  IDDM. Well controlled on Lantus and SSI.  Check labs tomorrow.  VTE-lovenox  @PROBHOSP @  LOS: 6 days    Ahniya Mitchum M 10/03/2013  . .prob

## 2013-10-04 LAB — GLUCOSE, CAPILLARY
GLUCOSE-CAPILLARY: 172 mg/dL — AB (ref 70–99)
GLUCOSE-CAPILLARY: 185 mg/dL — AB (ref 70–99)
Glucose-Capillary: 158 mg/dL — ABNORMAL HIGH (ref 70–99)
Glucose-Capillary: 179 mg/dL — ABNORMAL HIGH (ref 70–99)
Glucose-Capillary: 204 mg/dL — ABNORMAL HIGH (ref 70–99)

## 2013-10-04 LAB — CBC
HCT: 28.1 % — ABNORMAL LOW (ref 39.0–52.0)
HEMOGLOBIN: 9.3 g/dL — AB (ref 13.0–17.0)
MCH: 27.9 pg (ref 26.0–34.0)
MCHC: 33.1 g/dL (ref 30.0–36.0)
MCV: 84.4 fL (ref 78.0–100.0)
Platelets: 215 10*3/uL (ref 150–400)
RBC: 3.33 MIL/uL — AB (ref 4.22–5.81)
RDW: 13.9 % (ref 11.5–15.5)
WBC: 9.4 10*3/uL (ref 4.0–10.5)

## 2013-10-04 LAB — BASIC METABOLIC PANEL
Anion gap: 9 (ref 5–15)
BUN: 13 mg/dL (ref 6–23)
CALCIUM: 7.9 mg/dL — AB (ref 8.4–10.5)
CHLORIDE: 102 meq/L (ref 96–112)
CO2: 28 meq/L (ref 19–32)
Creatinine, Ser: 1.1 mg/dL (ref 0.50–1.35)
GFR calc Af Amer: >90 mL/min (ref >90)
GFR calc non Af Amer: 80 mL/min — ABNORMAL LOW (ref >90)
GLUCOSE: 168 mg/dL — AB (ref 70–99)
Potassium: 3.8 mEq/L (ref 3.7–5.3)
SODIUM: 139 meq/L (ref 137–147)

## 2013-10-04 MED ORDER — MAGNESIUM HYDROXIDE 400 MG/5ML PO SUSP: 15.0000 mL | Freq: Two times a day (BID) | ORAL | Status: AC

## 2013-10-04 MED ORDER — HYDROCODONE-ACETAMINOPHEN 7.5-325 MG PO TABS: 1.0000 | ORAL_TABLET | Freq: Four times a day (QID) | ORAL | Status: DC | PRN

## 2013-10-04 MED ORDER — INSULIN ASPART 100 UNIT/ML ~~LOC~~ SOLN
0.0000 [IU] | Freq: Four times a day (QID) | SUBCUTANEOUS | Status: DC
  Administered 2013-10-05: 2 [IU] via SUBCUTANEOUS

## 2013-10-04 MED ORDER — PANTOPRAZOLE SODIUM 40 MG PO TBEC
40.0000 mg | DELAYED_RELEASE_TABLET | Freq: Every day | ORAL | Status: DC
  Administered 2013-10-04: 40 mg via ORAL
  Filled 2013-10-04: qty 1

## 2013-10-04 NOTE — Progress Notes (Signed)
7 Days Post-Op  Subjective: Feeling better. Appetite has returned. Lots of flatus but still no stool. Ambulating without difficulty. Voiding without difficulty.  WBC 9400. Hemoglobin 9.3.  CBGs ranged from 109-201. On lantus and sliding scale insulin.  Appreciate internal medicine involvement in diabetic care.  Objective: Vital signs in last 24 hours: Temp:  [98.3 F (36.8 C)-99.1 F (37.3 C)] 98.4 F (36.9 C) (09/20 0538) Pulse Rate:  [87-92] 88 (09/20 0538) Resp:  [18-20] 20 (09/20 0538) BP: (134-159)/(72-81) 134/72 mmHg (09/20 0538) SpO2:  [97 %-99 %] 98 % (09/20 0538) Last BM Date: 10/03/13  Intake/Output from previous day: 09/19 0701 - 09/20 0700 In: 3207.5 [P.O.:1320; I.V.:1637.5; IV Piggyback:250] Out: -  Intake/Output this shift: Total I/O In: 1397.5 [P.O.:360; I.V.:1037.5] Out: -   General appearance: alert. Cooperative. Looks like he feels better. Mental status normal. No distress GI: abdomen is soft, flat, nontender. Active bowel sounds. Midline incision clean packed open.  Lab Results:  Results for orders placed during the hospital encounter of 09/27/13 (from the past 24 hour(s))  GLUCOSE, CAPILLARY     Status: Abnormal   Collection Time    10/03/13  8:03 AM      Result Value Ref Range   Glucose-Capillary 125 (*) 70 - 99 mg/dL   Comment 1 Notify RN    GLUCOSE, CAPILLARY     Status: Abnormal   Collection Time    10/03/13 11:28 AM      Result Value Ref Range   Glucose-Capillary 163 (*) 70 - 99 mg/dL   Comment 1 Notify RN    GLUCOSE, CAPILLARY     Status: Abnormal   Collection Time    10/03/13  4:45 PM      Result Value Ref Range   Glucose-Capillary 201 (*) 70 - 99 mg/dL   Comment 1 Notify RN    GLUCOSE, CAPILLARY     Status: Abnormal   Collection Time    10/03/13  8:11 PM      Result Value Ref Range   Glucose-Capillary 181 (*) 70 - 99 mg/dL   Comment 1 Notify RN    GLUCOSE, CAPILLARY     Status: Abnormal   Collection Time    10/04/13 12:02 AM    Result Value Ref Range   Glucose-Capillary 179 (*) 70 - 99 mg/dL   Comment 1 Notify RN    GLUCOSE, CAPILLARY     Status: Abnormal   Collection Time    10/04/13  3:54 AM      Result Value Ref Range   Glucose-Capillary 158 (*) 70 - 99 mg/dL   Comment 1 Notify RN    CBC     Status: Abnormal   Collection Time    10/04/13  5:35 AM      Result Value Ref Range   WBC 9.4  4.0 - 10.5 K/uL   RBC 3.33 (*) 4.22 - 5.81 MIL/uL   Hemoglobin 9.3 (*) 13.0 - 17.0 g/dL   HCT 28.1 (*) 39.0 - 52.0 %   MCV 84.4  78.0 - 100.0 fL   MCH 27.9  26.0 - 34.0 pg   MCHC 33.1  30.0 - 36.0 g/dL   RDW 13.9  11.5 - 15.5 %   Platelets 215  150 - 400 K/uL     Studies/Results: No results found.  . enoxaparin (LOVENOX) injection  40 mg Subcutaneous Q24H  . insulin aspart  0-15 Units Subcutaneous 6 times per day  . insulin glargine  20 Units Subcutaneous  Daily  . magnesium hydroxide  15 mL Oral BID  . pantoprazole (PROTONIX) IV  40 mg Intravenous Q24H  . piperacillin-tazobactam (ZOSYN)  IV  3.375 g Intravenous 3 times per day     Assessment/Plan: s/p Procedure(s): LAPAROSCOPY DIAGNOSTIC LAPAROSCOPIC ABDOMINAL EXPLORATION: INTERNAL ILIEUM, CECUM, RIGHT COLON  POD#7. Ileocecectomy for perforated appendicitis with diffuse peritonitis and pneumoperitoneum  Improving. Advance to soft diet M.O.M. One dose Probably can go home when he has a bowel movement. Continue IV Zosyn(D#8/10) - will continue as operating surgeon states peritonitis was significant  IDDM.  reasonable controlled on Lantus and SSI.  VTE-lovenox   @PROBHOSP @  LOS: 7 days    Nikolaj Geraghty M 10/04/2013  . .prob

## 2013-10-05 LAB — GLUCOSE, CAPILLARY
GLUCOSE-CAPILLARY: 140 mg/dL — AB (ref 70–99)
Glucose-Capillary: 119 mg/dL — ABNORMAL HIGH (ref 70–99)

## 2013-10-05 MED ORDER — HYDROCODONE-ACETAMINOPHEN 7.5-325 MG PO TABS: 1.0000 | ORAL_TABLET | Freq: Four times a day (QID) | ORAL | Status: DC | PRN

## 2013-10-05 MED ORDER — AMOXICILLIN-POT CLAVULANATE 875-125 MG PO TABS: 1.0000 | ORAL_TABLET | Freq: Two times a day (BID) | ORAL | Status: DC

## 2013-10-05 NOTE — Progress Notes (Signed)
D/c to home with family via wheelchair. Pain denied VSS. Dressing changes instructions given and supplies for changing the next day given and patient demonstrated understanding.

## 2013-10-05 NOTE — Discharge Summary (Signed)
Ready for discharge.  Alex Morrison. [name], MD, [facility]  General/ Trauma Surgery  10/05/2013 9:19 AM

## 2013-10-05 NOTE — Discharge Instructions (Signed)
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  You may not return to full duty for at least 6 weeks or per Dr. Brantley Stage.  Dressing Change A dressing is a material placed over wounds. It keeps the wound clean, dry, and protected from further injury. This provides an environment that favors wound healing.  BEFORE YOU BEGIN  Get your supplies together. Things you may need include:  Saline solution.  Flexible gauze dressing.  Medicated cream.  Tape.  Gloves.  Abdominal dressing pads.  Gauze squares.  Plastic bags.  Take pain medicine 30 minutes before the dressing change if you need it.  Take a shower before you do the first dressing change of the day. Use plastic wrap or a plastic bag to prevent the dressing from getting wet. REMOVING YOUR OLD DRESSING   Wash your hands with soap and water. Dry your hands with a clean towel.  Put on your gloves.  Remove any tape.  Carefully remove the old dressing. If the dressing sticks, you may dampen it with warm water to loosen it, or follow your caregiver's specific directions.  Remove any gauze or packing tape that is in your wound.  Take off your gloves.  Put the gloves, tape, gauze, or any packing tape into a plastic bag. CHANGING YOUR DRESSING  Open the supplies.  Take the cap off the saline solution.  Open the gauze package so that the gauze remains on the inside of the package.  Put on your gloves.  Clean your wound as told by your caregiver.  If you have been told to keep your wound dry, follow those instructions.  Your caregiver may tell you to do one or more of the following:  Pick up the gauze. Pour the saline solution over the gauze. Squeeze out the extra saline solution.  Put medicated cream or other medicine on your wound if you have been told to do so.  Put the solution soaked gauze only in your wound, not on the skin around it.  Pack your wound loosely or as told by  your caregiver.  Put dry gauze on your wound.  Put abdominal dressing pads over the dry gauze if your wet gauze soaks through.  Tape the abdominal dressing pads in place so they will not fall off. Do not wrap the tape completely around the affected part (arm, leg, abdomen).  Wrap the dressing pads with a flexible gauze dressing to secure it in place.  Take off your gloves. Put them in the plastic bag with the old dressing. Tie the bag shut and throw it away.  Keep the dressing clean and dry until your next dressing change.  Wash your hands. SEEK MEDICAL CARE IF:  Your skin around the wound looks red.  Your wound feels more tender or sore.  You see pus in the wound.  Your wound smells bad.  You have a fever.  Your skin around the wound has a rash that itches and burns.  You see black or yellow skin in your wound that was not there before.  You feel nauseous, throw up, and feel very tired. Document Released: 02/09/2004 Document Revised: 03/26/2011 Document Reviewed: 11/13/2010 [facility] Patient Information 2015 [place], [place]. This information is not intended to replace advice given to you by your health care provider. Make sure you discuss any questions you have with your health care provider.  [facility], [place]  [phone]  OPEN ABDOMINAL SURGERY: POST OP INSTRUCTIONS  Always review your discharge instruction sheet given to you by the facility where your surgery was performed.  IF YOU HAVE DISABILITY OR FAMILY LEAVE FORMS, YOU MUST BRING THEM TO THE OFFICE FOR PROCESSING.  PLEASE DO NOT GIVE THEM TO YOUR DOCTOR.  1. A prescription for pain medication may be given to you upon discharge.  Take your pain medication as prescribed, if needed.  If narcotic pain medicine is not needed, then you may take acetaminophen (Tylenol) or ibuprofen (Advil) as needed. 2. Take your usually prescribed medications unless otherwise directed. 3. If you need a refill on  your pain medication, please contact your pharmacy. They will contact our office to request authorization.  Prescriptions will not be filled after 5pm or on week-ends. 4. You should follow a light diet the first few days after arrival home, such as soup and crackers, pudding, etc.unless your doctor has advised otherwise. A high-fiber, low fat diet can be resumed as tolerated.   Be sure to include lots of fluids daily. Most patients will experience some swelling and bruising on the chest and neck area.  Ice packs will help.  Swelling and bruising can take several days to resolve 5. Most patients will experience some swelling and bruising in the area of the incision. Ice pack will help. Swelling and bruising can take several days to resolve..  6. It is common to experience some constipation if taking pain medication after surgery.  Increasing fluid intake and taking a stool softener will usually help or prevent this problem from occurring.  A mild laxative (Milk of Magnesia or Miralax) should be taken according to package directions if there are no bowel movements after 48 hours. 7.  You may have steri-strips (small skin tapes) in place directly over the incision.  These strips should be left on the skin for 7-10 days.  If your surgeon used skin glue on the incision, you may shower in 24 hours.  The glue will flake off over the next 2-3 weeks.  Any sutures or staples will be removed at the office during your follow-up visit. You may find that a light gauze bandage over your incision may keep your staples from being rubbed or pulled. You may shower and replace the bandage daily. 8. ACTIVITIES:  You may resume regular (light) daily activities beginning the next day--such as daily self-care, walking, climbing stairs--gradually increasing activities as tolerated.  You may have sexual intercourse when it is comfortable.  Refrain from any heavy lifting or straining until approved by your doctor. a. You may drive when  you no longer are taking prescription pain medication, you can comfortably wear a seatbelt, and you can safely maneuver your car and apply brakes b. Return to Work: ___________________________________ 76. You should see your doctor in the office for a follow-up appointment approximately two weeks after your surgery.  Make sure that you call for this appointment within a day or two after you arrive home to insure a convenient appointment time. OTHER INSTRUCTIONS:  _____________________________________________________________ _____________________________________________________________  WHEN TO CALL YOUR DOCTOR: 1. Fever over 101.0 2. Inability to urinate 3. Nausea and/or vomiting 4. Extreme swelling or bruising 5. Continued bleeding from incision. 6. Increased pain, redness, or drainage from the incision. 7. Difficulty swallowing or breathing 8. Muscle cramping or spasms. 9. Numbness or tingling in hands or feet or around lips.  The clinic staff is available to answer your questions during regular business hours.  Please  dont hesitate to call and ask to speak to one of the nurses if you have concerns.  For further questions, please visit [email or web address]

## 2013-10-05 NOTE — Discharge Summary (Signed)
Patient ID: Alex Morrison MRN: [id number] DOB/AGE: 01-19-1970 62 y.o.  Admit date: 09/27/2013 Discharge date: 10/05/2013  Procedures: 1. Diagnostic laparoscopy.  2. Exploratory laparotomy with ileocecectomy By Dr. Erroll Luna 09-27-13  Consults: internal medicine  Reason for Admission: Alex Morrison is a 43 year old male with a history of type 1 diabetes who presents with abdominal pain. Duration of symptoms is 3 days. 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 Time pattern initially was intermittent, got better on Friday and then acutely worsened on Saturday and became more persistent. Coarse is worsening. Associated with nausea, vomiting, diarrhea, chills, sweats, malaise and anorexia. Last oral intake was about 6:30AM which he promptly vomited. No modifying factors. No aggravating or alleviating factors. No previous symptoms. A CT of abdomen and pelvis shows perforated bowel with pneumoperitoneum, small to moderate amount of free fluid within the abdomen and pelvis with small bowel thickening, enlarged appendix suggesting ruptured appendicitis. We have therefore been asked to evaluate the patient. He has a normal white count, normal lactic acid, normal electrolytes, elevated BUN/sCr. He reports an episode of hypoglycemia this AM, glucose 243 now. He is mildly tachycadic, afebrile and a normal blood pressure.   Admission Diagnoses:  1. Perforated viscus with pneumoperitoneum 2. Type 1 DM 3. AKI  Hospital Course: The patient was admitted and taken to the OR where he underwent a laparotomy with ileocecectomy for perforated appendicitis. He was sent to the ADU postoperatively for close monitoring.  Internal Medicine was asked to assist [place] with this patient initially due to his DM and AKI. With his hydration and overall improvement, his AKI improved and resolved within a couple of days post op.  He had an NGT placed in the operating room.  This was continued for several  days due to a post op ileus.  He was on zosyn as well throughout the hospitalization.  On POD 3, he began passing some flatus and his NGT was clamped; however, he had some nausea and this was returned to suction on POD4.  The following day, he had minimal output and continued to pass flatus.  His tube was clamped again for 6 hours, which he tolerated.  This was then removed and he was put on clear liquids.  His diet was then able to be advanced as tolerated.  His midline abdominal wound remained open and packed as well.  He had staples in place around his umbilicus but the remainder of the wound was packed BID.  His staples were dc the day of discharge, which was POD 8.  The patient was otherwise stable and ok for dc home with 2 more days of abx therapy to complete a 10 day course.  Discharge Diagnoses:  Active Problems:   Acute gangrenous appendicitis with perforation and peritonitis   Type 1 diabetes mellitus   Acute kidney injury   Psoriasis s/p ex lap with ileocecectomy for perforated appendicitis  Discharge Medications:   Medication List         amoxicillin-clavulanate 875-125 MG per tablet  Commonly known as:  AUGMENTIN  Take 1 tablet by mouth 2 (two) times daily.     HYDROcodone-acetaminophen 7.5-325 MG per tablet  Commonly known as:  NORCO  Take 1 tablet by mouth every 6 (six) hours as needed for moderate pain.     insulin aspart 100 UNIT/ML injection  Commonly known as:  novoLOG  Before each meal 3 times a day, 140-199 - 2 units, 200-250 - 4  units, 251-299 - 6 units,  300-349 - 8 units,  350 or above 10 units. Insulin PEN if approved, provide syringes and needles if needed.     insulin glargine 100 UNIT/ML injection  Commonly known as:  LANTUS  Inject 25 Units into the skin at bedtime.     irbesartan 300 MG tablet  Commonly known as:  AVAPRO  Take 300 mg by mouth daily.        Discharge Instructions:     Follow-up Information   Follow up with CORNETT,THOMAS A., MD.  Schedule an appointment as soon as possible for a visit in 3 weeks.   Specialty:  General Surgery   Contact information:   [address] [place] [address] [phone]       Signed: Henreitta Cea 10/05/2013, 8:56 AM

## 2013-10-10 ENCOUNTER — Telehealth (INDEPENDENT_AMBULATORY_CARE_PROVIDER_SITE_OTHER): Payer: Self-pay | Admitting: General Surgery

## 2013-10-10 NOTE — Telephone Encounter (Signed)
Pt s/p emergent ileocecectomy for ruptured appx. Pt's father called in stating pt has had constant rt shoulder pain for past several days. No f/c/n/v/worsening abd pain/sob/cough/sputum. Good appetite. No injury. +bms. Took pt to see his ortho sx this am for evaluation bc pain was severe. Stated sx gave cortisone injection and didn't really see cause for shoulder pain. Advised father to try tylenol or left over pain med from hospitalization. If pain uncontrolled or worsening &/or develops addl symptoms (f/c/n/v/abd pain,etc) then def go to ED. Explained that sometimes a process in space bt liver and diaphragm could cause shoulder pain. Explained that if he felt he needed to be seen then safest course is to bring to ED

## 2014-08-28 ENCOUNTER — Emergency Department (HOSPITAL_COMMUNITY): Payer: BLUE CROSS/BLUE SHIELD

## 2014-08-28 ENCOUNTER — Emergency Department (HOSPITAL_COMMUNITY)
Admission: EM | Admit: 2014-08-28 | Discharge: 2014-08-28 | Disposition: A | Discharge status: Home / Self Care | Payer: BLUE CROSS/BLUE SHIELD | Attending: Emergency Medicine | Admitting: Emergency Medicine

## 2014-08-28 ENCOUNTER — Encounter (HOSPITAL_COMMUNITY): Payer: Self-pay | Admitting: *Deleted

## 2014-08-28 DIAGNOSIS — Z794 Long term (current) use of insulin: Secondary | ICD-10-CM | POA: Insufficient documentation

## 2014-08-28 DIAGNOSIS — E119 Type 2 diabetes mellitus without complications: Secondary | ICD-10-CM | POA: Insufficient documentation

## 2014-08-28 DIAGNOSIS — H538 Other visual disturbances: Secondary | ICD-10-CM | POA: Diagnosis not present

## 2014-08-28 DIAGNOSIS — H547 Unspecified visual loss: Secondary | ICD-10-CM | POA: Diagnosis present

## 2014-08-28 DIAGNOSIS — F419 Anxiety disorder, unspecified: Secondary | ICD-10-CM | POA: Insufficient documentation

## 2014-08-28 DIAGNOSIS — Z79899 Other long term (current) drug therapy: Secondary | ICD-10-CM | POA: Insufficient documentation

## 2014-08-28 LAB — COMPREHENSIVE METABOLIC PANEL
ALBUMIN: 3.4 g/dL — AB (ref 3.5–5.0)
ALT: 49 U/L (ref 17–63)
AST: 41 U/L (ref 15–41)
Alkaline Phosphatase: 106 U/L (ref 38–126)
Anion gap: 7 (ref 5–15)
BILIRUBIN TOTAL: 0.7 mg/dL (ref 0.3–1.2)
BUN: 34 mg/dL — ABNORMAL HIGH (ref 6–20)
CHLORIDE: 111 mmol/L (ref 101–111)
CO2: 23 mmol/L (ref 22–32)
Calcium: 8.6 mg/dL — ABNORMAL LOW (ref 8.9–10.3)
Creatinine, Ser: 1.76 mg/dL — ABNORMAL HIGH (ref 0.61–1.24)
GFR calc Af Amer: 53 mL/min — ABNORMAL LOW (ref >60)
GFR, EST NON AFRICAN AMERICAN: 45 mL/min — AB (ref >60)
GLUCOSE: 122 mg/dL — AB (ref 65–99)
POTASSIUM: 4.9 mmol/L (ref 3.5–5.1)
Sodium: 141 mmol/L (ref 135–145)
Total Protein: 5.6 g/dL — ABNORMAL LOW (ref 6.5–8.1)

## 2014-08-28 LAB — I-STAT TROPONIN, ED: Troponin i, poc: 0.01 ng/mL (ref 0.00–0.08)

## 2014-08-28 LAB — CBC
HEMATOCRIT: 32.5 % — AB (ref 39.0–52.0)
Hemoglobin: 11 g/dL — ABNORMAL LOW (ref 13.0–17.0)
MCH: 28.5 pg (ref 26.0–34.0)
MCHC: 33.8 g/dL (ref 30.0–36.0)
MCV: 84.2 fL (ref 78.0–100.0)
Platelets: 177 10*3/uL (ref 150–400)
RBC: 3.86 MIL/uL — ABNORMAL LOW (ref 4.22–5.81)
RDW: 13.2 % (ref 11.5–15.5)
WBC: 5.7 10*3/uL (ref 4.0–10.5)

## 2014-08-28 LAB — PROTIME-INR
INR: 1.01 (ref 0.00–1.49)
PROTHROMBIN TIME: 13.5 s (ref 11.6–15.2)

## 2014-08-28 LAB — DIFFERENTIAL
BASOS ABS: 0 10*3/uL (ref 0.0–0.1)
BASOS PCT: 1 % (ref 0–1)
Eosinophils Absolute: 0.2 10*3/uL (ref 0.0–0.7)
Eosinophils Relative: 3 % (ref 0–5)
Lymphocytes Relative: 23 % (ref 12–46)
Lymphs Abs: 1.3 10*3/uL (ref 0.7–4.0)
MONO ABS: 0.3 10*3/uL (ref 0.1–1.0)
Monocytes Relative: 6 % (ref 3–12)
Neutro Abs: 3.9 10*3/uL (ref 1.7–7.7)
Neutrophils Relative %: 67 % (ref 43–77)

## 2014-08-28 LAB — CBG MONITORING, ED: GLUCOSE-CAPILLARY: 103 mg/dL — AB (ref 65–99)

## 2014-08-28 LAB — APTT: APTT: 32 s (ref 24–37)

## 2014-08-28 MED ORDER — TROPICAMIDE 1 % OP SOLN
1.0000 [drp] | Freq: Once | OPHTHALMIC | Status: AC
  Administered 2014-08-28: 1 [drp] via OPHTHALMIC
  Filled 2014-08-28: qty 2

## 2014-08-28 MED ORDER — HOMATROPINE HBR 2 % OP SOLN
1.0000 [drp] | Freq: Once | OPHTHALMIC | Status: DC
Start: 2014-08-28 — End: 2014-08-28
  Filled 2014-08-28: qty 5

## 2014-08-28 NOTE — ED Provider Notes (Signed)
CSN: [id number]     Arrival date & time 08/28/14  1207 History   First MD Initiated Contact with Patient 08/28/14 1314     Chief Complaint  Patient presents with  . Loss of Vision    HPI  Alex Morrison is a 44 year old male with a PMHx of DM presenting with bilateral blurred vision. He reports that he noticed black floaters in his right eye on 8/5 that progressed to blurred vision later that day. He called his ophthalmologist to schedule an appointment but could not get in until next week. The blurred vision in his right eye progressively got worse over the past week. Last night he noticed floaters in his left eye and woke this morning with blurred vision in the left eye. He describes the blurred vision as looking through a sheet of thick plastic. Denies history of vision problems, corrective lenses, headaches, eye pain, pain with eye movement, slurred speech or muscle weakness. In triage, he had a BP reading of 193/104. He reports no history of HTN. Denies fever, chest pain, shortness of breath, abdominal pain, nausea, vomiting or diarrhea.   Past Medical History  Diagnosis Date  . Abscess of buttock   . Diabetes mellitus without complication   . Acute kidney injury 09/27/2013  . Perforated appendix     Peritonitis   Past Surgical History  Procedure Laterality Date  . Incision and drainage    . Shoulder surgery  1989  . Ex lap with ileocecectomy  09/27/13    Dr. Brantley Stage  . Laparoscopy N/A 09/27/2013    Procedure: LAPAROSCOPY DIAGNOSTIC;  Surgeon: Erroll Luna, MD;  Location: [facility];  Service: General;  Laterality: N/A;  . Laparoscopic abdominal exploration N/A 09/27/2013    Procedure: LAPAROSCOPIC ABDOMINAL EXPLORATION: INTERNAL ILIEUM, CECUM, RIGHT COLON;  Surgeon: Erroll Luna, MD;  Location: [facility];  Service: General;  Laterality: N/A;   Family History  Problem Relation Age of Onset  . Diabetes Mellitus II Mother   . Diabetes Mother   . Heart disease Mother   . Diabetes Mellitus I  Father   . Diabetes Father   . Diabetes Mellitus II Brother   . Diabetes Brother    Social History  Substance Use Topics  . Smoking status: Never Smoker   . Smokeless tobacco: None  . Alcohol Use: No    Review of Systems  Constitutional: Negative for fever and chills.  Eyes: Positive for visual disturbance. Negative for pain, discharge and redness.  Respiratory: Negative for cough and shortness of breath.   Cardiovascular: Negative for chest pain and palpitations.  Gastrointestinal: Negative for nausea, vomiting and abdominal pain.  Musculoskeletal: Negative for neck pain and neck stiffness.  Neurological: Negative for dizziness, syncope, speech difficulty, weakness, light-headedness, numbness and headaches.      Allergies  Review of patient's allergies indicates no known allergies.  Home Medications   Prior to Admission medications   Medication Sig Start Date End Date Taking? Authorizing Provider  ibuprofen (ADVIL,MOTRIN) 200 MG tablet Take 600 mg by mouth every 6 (six) hours as needed.   Yes Historical Provider, MD  insulin aspart (NOVOLOG FLEXPEN) 100 UNIT/ML FlexPen Inject 2-10 Units into the skin 3 (three) times daily. Per sliding scale: 140-199=2 units 200-250=4 units 251-299=6 units 300-349=8 units 350> =10 units   Yes Historical Provider, MD  Insulin Glargine (LANTUS SOLOSTAR) 100 UNIT/ML Solostar Pen Inject 25 Units into the skin at bedtime.   Yes Historical Provider, MD  irbesartan (AVAPRO) 300 MG  tablet Take 300 mg by mouth daily. 08/23/14  Yes Historical Provider, MD   BP 198/98 mmHg  Pulse 84  Temp(Src) 98.2 F (36.8 C) (Oral)  Resp 15  Ht 5\' 11"  (1.803 m)  Wt 225 lb (102.059 kg)  BMI 31.39 kg/m2  SpO2 100% Physical Exam  Constitutional: He is oriented to person, place, and time. He appears well-developed and well-nourished. He appears distressed (anxious).  HENT:  Head: Normocephalic and atraumatic.  Eyes: Conjunctivae and EOM are normal. Pupils are  equal, round, and reactive to light. Right eye exhibits no discharge. Left eye exhibits no discharge.  EOM intact with no pain. PERRL. Minor right peripheral field deficit. Tracks objects across room. On fundoscopic, erythema noted over retina. Could not visualize vessels   Cardiovascular: Normal rate and regular rhythm.   Pulmonary/Chest: Breath sounds normal. No respiratory distress.  Abdominal: Soft. He exhibits no distension. There is no tenderness.  Neurological: He is alert and oriented to person, place, and time. He has normal strength. A cranial nerve deficit is present.  Visual acuity bilateral 20/800. EOM intact. No nystagmus. Facial muscle and sensation intact. Uvula midline. Tongue protrusion midline. 5/5 strength on shoulder shrug. 5/5 strength upper and lower extremities. Sensation intact over upper and lower extremities. Negative finger to nose and heel to shin cerebellar test.  Skin: Skin is warm and dry.  Psychiatric: He has a normal mood and affect.  Vitals reviewed.   ED Course  Procedures (including critical care time) Labs Review Labs Reviewed  CBC - Abnormal; Notable for the following:    RBC 3.86 (*)    Hemoglobin 11.0 (*)    HCT 32.5 (*)    All other components within normal limits  COMPREHENSIVE METABOLIC PANEL - Abnormal; Notable for the following:    Glucose, Bld 122 (*)    BUN 34 (*)    Creatinine, Ser 1.76 (*)    Calcium 8.6 (*)    Total Protein 5.6 (*)    Albumin 3.4 (*)    GFR calc non Af Amer 45 (*)    GFR calc Af Amer 53 (*)    All other components within normal limits  CBG MONITORING, ED - Abnormal; Notable for the following:    Glucose-Capillary 103 (*)    All other components within normal limits  DIFFERENTIAL  PROTIME-INR  APTT  I-STAT TROPOININ, ED   Case discussed with Dr. Jeanell Sparrow. Ophthalmology consulted and wanted to see Alex Morrison in his office ASAP.   MDM   Final diagnoses:  Blurred vision, bilateral    1. Bilateral blurred  vision - Stroke orders, head CT non con ordered. No abnormalities on head CT - CBG 103 - Dr. Jeanell Sparrow consulted with ophthalmology, Dr. Bing Plume, who wants to see Alex Morrison in his office ASAP - Directions to [facility] and cell phone number of Dr. Bing Plume given to Alex Morrison and his father. Discussed with them importance of going straight to his office after leaving ED. Both expressed understanding. - Return to ED as needed -Follow up with your PCP on Monday morning for a blood pressure recheck   Josephina Gip, PA-C 08/28/14 1636  Pattricia Boss, MD 08/29/14 [phone]

## 2014-08-28 NOTE — ED Notes (Signed)
  CBG 103  

## 2014-08-28 NOTE — ED Notes (Signed)
Patient transported to CT 

## 2014-08-28 NOTE — ED Notes (Signed)
Patient returned from CT

## 2014-08-28 NOTE — Discharge Instructions (Signed)
-  Go to [place] to [employer]. Take right off [facility], take first right, building has green awning. It is behind [facility] -Call cell phone of Dr. Bing Plume [phone]) if you get lost - Make PCP appointment for Monday for high blood pressure follow up

## 2014-08-28 NOTE — ED Notes (Addendum)
Pt reports vision changes to right eye since 8/5, getting progressively worse and now having change in vision to left eye since last night. BP is 193/104 at triage, denies hx of htn.

## 2014-08-30 ENCOUNTER — Encounter (INDEPENDENT_AMBULATORY_CARE_PROVIDER_SITE_OTHER): Payer: BLUE CROSS/BLUE SHIELD | Admitting: Ophthalmology

## 2014-08-30 DIAGNOSIS — E11351 Type 2 diabetes mellitus with proliferative diabetic retinopathy with macular edema: Secondary | ICD-10-CM

## 2014-08-30 DIAGNOSIS — H43813 Vitreous degeneration, bilateral: Secondary | ICD-10-CM | POA: Diagnosis not present

## 2014-08-30 DIAGNOSIS — H4313 Vitreous hemorrhage, bilateral: Secondary | ICD-10-CM

## 2014-08-30 DIAGNOSIS — E11311 Type 2 diabetes mellitus with unspecified diabetic retinopathy with macular edema: Secondary | ICD-10-CM | POA: Diagnosis not present

## 2014-09-08 ENCOUNTER — Encounter (INDEPENDENT_AMBULATORY_CARE_PROVIDER_SITE_OTHER): Payer: BLUE CROSS/BLUE SHIELD | Admitting: Ophthalmology

## 2014-09-08 ENCOUNTER — Encounter (INDEPENDENT_AMBULATORY_CARE_PROVIDER_SITE_OTHER): Payer: Self-pay | Admitting: Ophthalmology

## 2014-09-08 DIAGNOSIS — E11311 Type 2 diabetes mellitus with unspecified diabetic retinopathy with macular edema: Secondary | ICD-10-CM

## 2014-09-08 DIAGNOSIS — E11351 Type 2 diabetes mellitus with proliferative diabetic retinopathy with macular edema: Secondary | ICD-10-CM | POA: Diagnosis not present

## 2014-10-18 ENCOUNTER — Telehealth: Payer: Self-pay | Admitting: Cardiovascular Disease

## 2014-10-18 NOTE — Telephone Encounter (Signed)
Received faxed referral from [facility] for upcoming appointment with Dr. Oval Linsey on 10/20/14.  Records given to [facility] ([facility]).  cbr

## 2014-10-20 ENCOUNTER — Encounter: Payer: Self-pay | Admitting: Cardiovascular Disease

## 2014-10-20 ENCOUNTER — Ambulatory Visit (INDEPENDENT_AMBULATORY_CARE_PROVIDER_SITE_OTHER): Payer: BLUE CROSS/BLUE SHIELD | Admitting: Cardiovascular Disease

## 2014-10-20 VITALS — BP 124/78 | HR 83 | Ht 71.0 in | Wt 205.0 lb

## 2014-10-20 DIAGNOSIS — R072 Precordial pain: Secondary | ICD-10-CM | POA: Diagnosis not present

## 2014-10-20 DIAGNOSIS — I10 Essential (primary) hypertension: Secondary | ICD-10-CM | POA: Diagnosis not present

## 2014-10-20 DIAGNOSIS — N179 Acute kidney failure, unspecified: Secondary | ICD-10-CM

## 2014-10-20 NOTE — Patient Instructions (Addendum)
Your physician has requested that you have a renal artery duplex. During this test, an ultrasound is used to evaluate blood flow to the kidneys. Allow one hour for this exam. Do not eat after midnight the day before and avoid carbonated beverages. Take your medications as you usually do.  Your physician has requested that you have a lexiscan myoview. For further information please visit [email or web address]. Please follow instruction sheet, as given.  WILL GET LABS FROM Dr Osborne Casco  WILL CONTACT WITH RESULTS.  Your physician wants you to follow-up in [place].  You will receive a reminder letter in the mail two months in advance. If you don't receive a letter, please call our office to schedule the follow-up appointment.

## 2014-10-20 NOTE — Progress Notes (Signed)
Cardiology Office Note   Date:  10/20/2014   ID:  Alex Morrison, DOB 1970/05/05, MRN [id number]  PCP:  Haywood Pao, MD  Cardiologist:   Sharol Harness, MD   Chief Complaint  Patient presents with  . Appointment    has had chest disocmfort, shob      History of Present Illness: Alex Morrison is a 44 y.o. male with diabetes mellitus type 1 diagnosed 2 years ago who presents for an evaluation of fatigue and chest pain.  He reports 4-5 weeks of extreme fatigue with exertion.  This occurs when walking up and down stairs or carrying a stack of papers.  When he stands after one of these episodes he feels dizzy and near syncope.  He denies shortness of breath and reports insomnia.   He also reports constant chest discomfort that lasts throughout the day and is 1-2 out of 10 in severity. Occasionally it increases to 8 out of 10 and feels like a tightening. This often happens at rest and doesn't typically occur with exertion.  He denies shortness of breath, nausea, vomiting, diaphoresis, lower extremity edema, orthopnea or PND. He does note tinnitus and feeling like his heart beat is in his ears and a strong heart beat, though it is not irregular.  Mr. Scobey has also been struggling with his blood pressure. He has been working with this PCP and it is now under better control, though he is taking 5 antihypertensives. He is diagnosed with diabetes mellitus type 1 at age 59.  He's been trying to exercise and likes to walk on the treadmill. However recently he's had some visual loss and feels unsteady walking on the treadmill due to his vision and dizziness.  Mr. Robare father had a bypass surgery at age 71 and his mother had a heart attack 12 years ago.   Past Medical History  Diagnosis Date  . Abscess of buttock   . Diabetes mellitus without complication ([place])   . Acute kidney injury ([place]) 09/27/2013  . Perforated appendix     Peritonitis    Past Surgical History  Procedure  Laterality Date  . Incision and drainage    . Shoulder surgery  1989  . Ex lap with ileocecectomy  09/27/13    Dr. Brantley Stage  . Laparoscopy N/A 09/27/2013    Procedure: LAPAROSCOPY DIAGNOSTIC;  Surgeon: Erroll Luna, MD;  Location: [facility];  Service: General;  Laterality: N/A;  . Laparoscopic abdominal exploration N/A 09/27/2013    Procedure: LAPAROSCOPIC ABDOMINAL EXPLORATION: INTERNAL ILIEUM, CECUM, RIGHT COLON;  Surgeon: Erroll Luna, MD;  Location: [facility];  Service: General;  Laterality: N/A;     Current Outpatient Prescriptions  Medication Sig Dispense Refill  . amLODipine (NORVASC) 5 MG tablet Take 1 tablet by mouth daily.  3  . cloNIDine (CATAPRES) 0.1 MG tablet Take 1 tablet by mouth 3 (three) times daily as needed. For BP > 160/90 ( atleast 6 hours apart)  0  . DULoxetine (CYMBALTA) 30 MG capsule Take 30 mg by mouth daily.  5  . DUREZOL 0.05 % EMUL INSTILL 1 DROP INTO LEFT EYE TWICE DAILY FOR 2 WEEKS  2  . hydrochlorothiazide (HYDRODIURIL) 25 MG tablet Take 25 mg by mouth daily.  5  . ibuprofen (ADVIL,MOTRIN) 200 MG tablet Take 600 mg by mouth every 6 (six) hours as needed.    . insulin aspart (NOVOLOG FLEXPEN) 100 UNIT/ML FlexPen Inject 2-10 Units into the skin 3 (three) times daily. Per sliding  scale: 140-199=2 units 200-250=4 units 251-299=6 units 300-349=8 units 350> =10 units    . Insulin Glargine (LANTUS SOLOSTAR) 100 UNIT/ML Solostar Pen Inject 25 Units into the skin at bedtime.    . irbesartan (AVAPRO) 300 MG tablet Take 300 mg by mouth daily.  1  . metoprolol succinate (TOPROL-XL) 25 MG 24 hr tablet Take 25 mg by mouth daily.  5   No current facility-administered medications for this visit.    Allergies:   Review of patient's allergies indicates no known allergies.    Social History:  The patient  reports that he has never smoked. He does not have any smokeless tobacco history on file. He reports that he does not drink alcohol or use illicit drugs.   Family  History:  The patient's family history includes Diabetes in his brother, father, and mother; Diabetes Mellitus I in his father; Diabetes Mellitus II in his brother and mother; Heart disease in his mother.    ROS:  Please see the history of present illness.   Otherwise, review of systems are positive for none.   All other systems are reviewed and negative.    PHYSICAL EXAM: VS:  BP 124/78 mmHg  Pulse 83  Ht 5\' 11"  (1.803 m)  Wt 92.987 kg (205 lb)  BMI 28.60 kg/m2 , BMI Body mass index is 28.6 kg/(m^2). GENERAL:  Well appearing HEENT:  Pupils equal round and reactive, fundi not visualized, oral mucosa unremarkable NECK:  No jugular venous distention, waveform within normal limits, carotid upstroke brisk and symmetric, no bruits, no thyromegaly LYMPHATICS:  No cervical adenopathy LUNGS:  Clear to auscultation bilaterally HEART:  RRR.  PMI not displaced or sustained,S1 and S2 within normal limits, no S3, no S4, no clicks, no rubs, no murmurs ABD:  Flat, positive bowel sounds normal in frequency in pitch, no bruits, no rebound, no guarding, no midline pulsatile mass, no hepatomegaly, no splenomegaly EXT:  2 plus pulses throughout, no edema, no cyanosis no clubbing SKIN:  No rashes no nodules NEURO:  Cranial nerves II through XII grossly intact, motor grossly intact throughout PSYCH:  Cognitively intact, oriented to person place and time    EKG:  EKG is ordered today. The ekg ordered today demonstrates sinus rhythm at 83 bpm.  One PVC.     Recent Labs: 08/28/2014: ALT 49; BUN 34*; Creatinine, Ser 1.76*; Hemoglobin 11.0*; Platelets 177; Potassium 4.9; Sodium 141    Lipid Panel    Component Value Date/Time   CHOL 205* 07/16/2012 0400   TRIG 102 07/16/2012 0400   HDL 53 07/16/2012 0400   CHOLHDL 3.9 07/16/2012 0400   VLDL 20 07/16/2012 0400   LDLCALC 132* 07/16/2012 0400   10/06/14: [facility]    Wt Readings from Last 3 Encounters:  10/20/14 92.987 kg (205 lb)  08/28/14 102.059 kg (225  lb)  09/29/13 104.917 kg (231 lb 4.8 oz)      Other studies Reviewed: Additional studies/ records that were reviewed today include: Review of the above records demonstrates:  Please see elsewhere in the note.     ASSESSMENT AND PLAN:  # Chest pain: Mr. Doerner's exertional chest pain is concerning for CCS Class III angina.  We will refer him for pharmacologic stress to evaluate for ischemia.  He reports having his lipids checked with his PCP recently.  We will obtain these records and determine whether he needs aspirin and statin therapy. - Lexiscan Myoview - Obtain lipids - Continue metoprolol  # Hypertension: BP well-controlled.  Continue  amlodipine, clonidine, HCTZ, irbesartan and metoprolol.  He reports that he recently developed hypertension and it takes 5 medications to control his blood pressure.  We will refer him for renal artery duplex ultrasound to evaluate for renal artery stenosis.   Current medicines are reviewed at length with the patient today.  The patient does not have concerns regarding medicines.  The following changes have been made:  no change  Labs/ tests ordered today include:   Orders Placed This Encounter  Procedures  . Myocardial Perfusion Imaging  . EKG 12-Lead     Disposition:   FU with Marty Uy C. Oval Linsey, MD in 6 months   Signed, Sharol Harness, MD  10/20/2014 1:42 PM    [facility]

## 2014-10-21 ENCOUNTER — Encounter: Payer: Self-pay | Admitting: Cardiovascular Disease

## 2014-10-29 ENCOUNTER — Telehealth (HOSPITAL_COMMUNITY): Payer: Self-pay

## 2014-10-29 NOTE — Telephone Encounter (Signed)
Encounter complete. 

## 2014-11-03 ENCOUNTER — Ambulatory Visit (HOSPITAL_COMMUNITY)
Admission: RE | Admit: 2014-11-03 | Discharge: 2014-11-03 | Disposition: A | Discharge status: Home / Self Care | Payer: BLUE CROSS/BLUE SHIELD | Source: Ambulatory Visit | Attending: Cardiovascular Disease | Admitting: Cardiovascular Disease

## 2014-11-03 ENCOUNTER — Ambulatory Visit (HOSPITAL_BASED_OUTPATIENT_CLINIC_OR_DEPARTMENT_OTHER)
Admission: RE | Admit: 2014-11-03 | Discharge: 2014-11-03 | Disposition: A | Discharge status: Home / Self Care | Payer: BLUE CROSS/BLUE SHIELD | Source: Ambulatory Visit | Attending: Cardiovascular Disease | Admitting: Cardiovascular Disease

## 2014-11-03 DIAGNOSIS — R55 Syncope and collapse: Secondary | ICD-10-CM | POA: Diagnosis not present

## 2014-11-03 DIAGNOSIS — N179 Acute kidney failure, unspecified: Secondary | ICD-10-CM | POA: Insufficient documentation

## 2014-11-03 DIAGNOSIS — Z8249 Family history of ischemic heart disease and other diseases of the circulatory system: Secondary | ICD-10-CM | POA: Insufficient documentation

## 2014-11-03 DIAGNOSIS — E109 Type 1 diabetes mellitus without complications: Secondary | ICD-10-CM | POA: Diagnosis not present

## 2014-11-03 DIAGNOSIS — R002 Palpitations: Secondary | ICD-10-CM | POA: Insufficient documentation

## 2014-11-03 DIAGNOSIS — R42 Dizziness and giddiness: Secondary | ICD-10-CM | POA: Insufficient documentation

## 2014-11-03 DIAGNOSIS — R0602 Shortness of breath: Secondary | ICD-10-CM | POA: Diagnosis not present

## 2014-11-03 DIAGNOSIS — R072 Precordial pain: Secondary | ICD-10-CM

## 2014-11-03 DIAGNOSIS — I10 Essential (primary) hypertension: Secondary | ICD-10-CM | POA: Insufficient documentation

## 2014-11-03 DIAGNOSIS — R5383 Other fatigue: Secondary | ICD-10-CM | POA: Diagnosis not present

## 2014-11-03 DIAGNOSIS — R0609 Other forms of dyspnea: Secondary | ICD-10-CM | POA: Diagnosis not present

## 2014-11-03 LAB — MYOCARDIAL PERFUSION IMAGING
CHL CUP NUCLEAR SDS: 0
CHL CUP NUCLEAR SRS: 0
CHL CUP RESTING HR STRESS: 81 {beats}/min
LV dias vol: 152 mL
LV sys vol: 62 mL
Peak HR: 93 {beats}/min
SSS: 0
TID: 0.98

## 2014-11-03 MED ORDER — TECHNETIUM TC 99M SESTAMIBI GENERIC - CARDIOLITE
32.8000 | Freq: Once | INTRAVENOUS | Status: AC | PRN
  Administered 2014-11-03: 32.8 via INTRAVENOUS

## 2014-11-03 MED ORDER — REGADENOSON 0.4 MG/5ML IV SOLN
0.4000 mg | Freq: Once | INTRAVENOUS | Status: AC
  Administered 2014-11-03: 0.4 mg via INTRAVENOUS

## 2014-11-03 MED ORDER — TECHNETIUM TC 99M SESTAMIBI GENERIC - CARDIOLITE
10.5000 | Freq: Once | INTRAVENOUS | Status: AC | PRN
  Administered 2014-11-03: 10.5 via INTRAVENOUS

## 2014-11-04 ENCOUNTER — Telehealth: Payer: Self-pay | Admitting: *Deleted

## 2014-11-04 NOTE — Telephone Encounter (Signed)
-----   Message from Skeet Latch, MD sent at 11/03/2014  1:57 PM EDT ----- Normal stress.

## 2014-11-04 NOTE — Telephone Encounter (Signed)
Spoke to patient. Result given . Verbalized understanding  

## 2014-11-04 NOTE — Telephone Encounter (Signed)
LEFT MESSAGE TO CALL BACK

## 2014-11-04 NOTE — Telephone Encounter (Signed)
Left message to call back  

## 2014-11-04 NOTE — Telephone Encounter (Signed)
Returning your call. °

## 2014-11-08 ENCOUNTER — Encounter (INDEPENDENT_AMBULATORY_CARE_PROVIDER_SITE_OTHER): Payer: BLUE CROSS/BLUE SHIELD | Admitting: Ophthalmology

## 2014-11-08 DIAGNOSIS — H35033 Hypertensive retinopathy, bilateral: Secondary | ICD-10-CM | POA: Diagnosis not present

## 2014-11-08 DIAGNOSIS — E11319 Type 2 diabetes mellitus with unspecified diabetic retinopathy without macular edema: Secondary | ICD-10-CM

## 2014-11-08 DIAGNOSIS — H4313 Vitreous hemorrhage, bilateral: Secondary | ICD-10-CM | POA: Diagnosis not present

## 2014-11-08 DIAGNOSIS — H2513 Age-related nuclear cataract, bilateral: Secondary | ICD-10-CM

## 2014-11-08 DIAGNOSIS — E113533 Type 2 diabetes mellitus with proliferative diabetic retinopathy with traction retinal detachment not involving the macula, bilateral: Secondary | ICD-10-CM | POA: Diagnosis not present

## 2014-11-08 DIAGNOSIS — I10 Essential (primary) hypertension: Secondary | ICD-10-CM

## 2014-11-10 ENCOUNTER — Telehealth: Payer: Self-pay | Admitting: *Deleted

## 2014-11-10 NOTE — Telephone Encounter (Signed)
Left message to call back  

## 2014-11-10 NOTE — Telephone Encounter (Signed)
-----   Message from Skeet Latch, MD sent at 11/09/2014  5:36 PM EDT ----- Normal renal arteries. This does not explain his high blood pressure.

## 2014-11-10 NOTE — Telephone Encounter (Signed)
Spoke to patient. Result given . Verbalized understanding  

## 2014-11-10 NOTE — Telephone Encounter (Signed)
Returning your call. °

## 2014-11-24 ENCOUNTER — Telehealth: Payer: Self-pay

## 2014-11-24 NOTE — Telephone Encounter (Signed)
Request for surgical clearance:  1. What type of surgery is being performed? Retina surgery   2. When is this surgery scheduled? pending   3. Are there any medications that need to be held prior to surgery and how long? n/a   4. Name of physician performing surgery? Tempie Hoist, MD   5. What is your office phone and fax number? [facility] Phone - [phone] Fax - [phone]

## 2014-11-24 NOTE — Telephone Encounter (Signed)
Per Dr Berneice Gandy, patient is low risk and is no on any blood thinners.  Signed clearance form faxed back to Dr Collins [place] office.

## 2014-12-13 ENCOUNTER — Other Ambulatory Visit: Payer: Self-pay | Admitting: *Deleted

## 2014-12-13 DIAGNOSIS — Z01810 Encounter for preprocedural cardiovascular examination: Secondary | ICD-10-CM

## 2014-12-13 DIAGNOSIS — N183 Chronic kidney disease, stage 3 unspecified: Secondary | ICD-10-CM

## 2015-01-11 NOTE — Discharge Instructions (Signed)
Epoetin Alfa injection What is this medicine? EPOETIN ALFA (e POE e tin AL fa) helps your body make more red blood cells. This medicine is used to treat anemia caused by chronic kidney failure, cancer chemotherapy, or HIV-therapy. It may also be used before surgery if you have anemia. This medicine may be used for other purposes; ask your health care provider or pharmacist if you have questions. What should I tell my health care provider before I take this medicine? They need to know if you have any of these conditions: -blood clotting disorders -cancer patient not on chemotherapy -cystic fibrosis -heart disease, such as angina or heart failure -hemoglobin level of 12 g/dL or greater -high blood pressure -low levels of folate, iron, or vitamin B12 -seizures -an unusual or allergic reaction to erythropoietin, albumin, benzyl alcohol, hamster proteins, other medicines, foods, dyes, or preservatives -pregnant or trying to get pregnant -breast-feeding How should I use this medicine? This medicine is for injection into a vein or under the skin. It is usually given by a health care professional in a hospital or clinic setting. If you get this medicine at home, you will be taught how to prepare and give this medicine. Use exactly as directed. Take your medicine at regular intervals. Do not take your medicine more often than directed. It is important that you put your used needles and syringes in a special sharps container. Do not put them in a trash can. If you do not have a sharps container, call your pharmacist or healthcare provider to get one. Talk to your pediatrician regarding the use of this medicine in children. While this drug may be prescribed for selected conditions, precautions do apply. Overdosage: If you think you have taken too much of this medicine contact a poison control center or emergency room at once. NOTE: This medicine is only for you. Do not share this medicine with  others. What if I miss a dose? If you miss a dose, take it as soon as you can. If it is almost time for your next dose, take only that dose. Do not take double or extra doses. What may interact with this medicine? Do not take this medicine with any of the following medications: -darbepoetin alfa This list may not describe all possible interactions. Give your health care provider a list of all the medicines, herbs, non-prescription drugs, or dietary supplements you use. Also tell them if you smoke, drink alcohol, or use illegal drugs. Some items may interact with your medicine. What should I watch for while using this medicine? Visit your prescriber or health care professional for regular checks on your progress and for the needed blood tests and blood pressure measurements. It is especially important for the doctor to make sure your hemoglobin level is in the desired range, to limit the risk of potential side effects and to give you the best benefit. Keep all appointments for any recommended tests. Check your blood pressure as directed. Ask your doctor what your blood pressure should be and when you should contact him or her. As your body makes more red blood cells, you may need to take iron, folic acid, or vitamin B supplements. Ask your doctor or health care provider which products are right for you. If you have kidney disease continue dietary restrictions, even though this medication can make you feel better. Talk with your doctor or health care professional about the foods you eat and the vitamins that you take. What side effects may I notice   from receiving this medicine? Side effects that you should report to your doctor or health care professional as soon as possible: -allergic reactions like skin rash, itching or hives, swelling of the face, lips, or tongue -breathing problems -changes in vision -chest pain -confusion, trouble speaking or understanding -feeling faint or lightheaded,  falls -high blood pressure -muscle aches or pains -pain, swelling, warmth in the leg -rapid weight gain -severe headaches -sudden numbness or weakness of the face, arm or leg -trouble walking, dizziness, loss of balance or coordination -seizures (convulsions) -swelling of the ankles, feet, hands -unusually weak or tired Side effects that usually do not require medical attention (report to your doctor or health care professional if they continue or are bothersome): -diarrhea -fever, chills (flu-like symptoms) -headaches -nausea, vomiting -redness, stinging, or swelling at site where injected This list may not describe all possible side effects. Call your doctor for medical advice about side effects. You may report side effects to FDA at 1-800-FDA-1088. Where should I keep my medicine? Keep out of the reach of children. Store in a refrigerator between 2 and 8 degrees C (36 and 46 degrees F). Do not freeze or shake. Throw away any unused portion if using a single-dose vial. Multi-dose vials can be kept in the refrigerator for up to 21 days after the initial dose. Throw away unused medicine. NOTE: This sheet is a summary. It may not cover all possible information. If you have questions about this medicine, talk to your doctor, pharmacist, or health care provider.    2016, Elsevier/Gold Standard. (2007-12-16 10:25:44)  

## 2015-01-12 ENCOUNTER — Inpatient Hospital Stay (HOSPITAL_COMMUNITY)
Admission: RE | Admit: 2015-01-12 | Discharge: 2015-01-12 | Disposition: A | Discharge status: Home / Self Care | Payer: BLUE CROSS/BLUE SHIELD | Source: Ambulatory Visit | Attending: Nephrology | Admitting: Nephrology

## 2015-01-13 ENCOUNTER — Encounter: Payer: Self-pay | Admitting: Vascular Surgery

## 2015-01-19 ENCOUNTER — Encounter (INDEPENDENT_AMBULATORY_CARE_PROVIDER_SITE_OTHER): Payer: BLUE CROSS/BLUE SHIELD | Admitting: Ophthalmology

## 2015-01-19 DIAGNOSIS — H4313 Vitreous hemorrhage, bilateral: Secondary | ICD-10-CM

## 2015-01-19 DIAGNOSIS — E11311 Type 2 diabetes mellitus with unspecified diabetic retinopathy with macular edema: Secondary | ICD-10-CM

## 2015-01-19 DIAGNOSIS — H43813 Vitreous degeneration, bilateral: Secondary | ICD-10-CM

## 2015-01-19 DIAGNOSIS — E113513 Type 2 diabetes mellitus with proliferative diabetic retinopathy with macular edema, bilateral: Secondary | ICD-10-CM | POA: Diagnosis not present

## 2015-01-19 DIAGNOSIS — H2513 Age-related nuclear cataract, bilateral: Secondary | ICD-10-CM

## 2015-01-21 ENCOUNTER — Other Ambulatory Visit (HOSPITAL_COMMUNITY): Payer: BLUE CROSS/BLUE SHIELD

## 2015-01-21 ENCOUNTER — Ambulatory Visit: Payer: BLUE CROSS/BLUE SHIELD | Admitting: Vascular Surgery

## 2015-01-21 ENCOUNTER — Encounter (HOSPITAL_COMMUNITY): Payer: BLUE CROSS/BLUE SHIELD

## 2015-03-23 ENCOUNTER — Encounter (INDEPENDENT_AMBULATORY_CARE_PROVIDER_SITE_OTHER): Payer: BLUE CROSS/BLUE SHIELD | Admitting: Ophthalmology

## 2015-03-23 DIAGNOSIS — E113513 Type 2 diabetes mellitus with proliferative diabetic retinopathy with macular edema, bilateral: Secondary | ICD-10-CM | POA: Diagnosis not present

## 2015-03-23 DIAGNOSIS — I10 Essential (primary) hypertension: Secondary | ICD-10-CM | POA: Diagnosis not present

## 2015-03-23 DIAGNOSIS — H2513 Age-related nuclear cataract, bilateral: Secondary | ICD-10-CM

## 2015-03-23 DIAGNOSIS — E11311 Type 2 diabetes mellitus with unspecified diabetic retinopathy with macular edema: Secondary | ICD-10-CM

## 2015-03-23 DIAGNOSIS — H43813 Vitreous degeneration, bilateral: Secondary | ICD-10-CM | POA: Diagnosis not present

## 2015-03-23 DIAGNOSIS — H4313 Vitreous hemorrhage, bilateral: Secondary | ICD-10-CM

## 2015-03-23 DIAGNOSIS — H35033 Hypertensive retinopathy, bilateral: Secondary | ICD-10-CM

## 2015-04-18 DIAGNOSIS — H4089 Other specified glaucoma: Secondary | ICD-10-CM | POA: Diagnosis not present

## 2015-04-19 DIAGNOSIS — H4089 Other specified glaucoma: Secondary | ICD-10-CM | POA: Diagnosis not present

## 2015-04-19 DIAGNOSIS — Z9049 Acquired absence of other specified parts of digestive tract: Secondary | ICD-10-CM | POA: Diagnosis not present

## 2015-04-19 DIAGNOSIS — Z79899 Other long term (current) drug therapy: Secondary | ICD-10-CM | POA: Diagnosis not present

## 2015-04-19 DIAGNOSIS — I129 Hypertensive chronic kidney disease with stage 1 through stage 4 chronic kidney disease, or unspecified chronic kidney disease: Secondary | ICD-10-CM | POA: Diagnosis not present

## 2015-04-19 DIAGNOSIS — H4050X0 Glaucoma secondary to other eye disorders, unspecified eye, stage unspecified: Secondary | ICD-10-CM | POA: Diagnosis not present

## 2015-04-19 DIAGNOSIS — N189 Chronic kidney disease, unspecified: Secondary | ICD-10-CM | POA: Diagnosis not present

## 2015-04-19 DIAGNOSIS — Z961 Presence of intraocular lens: Secondary | ICD-10-CM | POA: Diagnosis not present

## 2015-04-19 DIAGNOSIS — H269 Unspecified cataract: Secondary | ICD-10-CM | POA: Diagnosis not present

## 2015-04-19 DIAGNOSIS — H2511 Age-related nuclear cataract, right eye: Secondary | ICD-10-CM | POA: Diagnosis not present

## 2015-04-19 DIAGNOSIS — Z794 Long term (current) use of insulin: Secondary | ICD-10-CM | POA: Diagnosis not present

## 2015-04-19 DIAGNOSIS — E1022 Type 1 diabetes mellitus with diabetic chronic kidney disease: Secondary | ICD-10-CM | POA: Diagnosis not present

## 2015-04-19 DIAGNOSIS — Z87891 Personal history of nicotine dependence: Secondary | ICD-10-CM | POA: Diagnosis not present

## 2015-05-23 ENCOUNTER — Encounter (INDEPENDENT_AMBULATORY_CARE_PROVIDER_SITE_OTHER): Payer: BLUE CROSS/BLUE SHIELD | Admitting: Ophthalmology

## 2015-05-23 DIAGNOSIS — H4313 Vitreous hemorrhage, bilateral: Secondary | ICD-10-CM | POA: Diagnosis not present

## 2015-05-23 DIAGNOSIS — H43813 Vitreous degeneration, bilateral: Secondary | ICD-10-CM | POA: Diagnosis not present

## 2015-05-23 DIAGNOSIS — E11311 Type 2 diabetes mellitus with unspecified diabetic retinopathy with macular edema: Secondary | ICD-10-CM

## 2015-05-23 DIAGNOSIS — H35033 Hypertensive retinopathy, bilateral: Secondary | ICD-10-CM

## 2015-05-23 DIAGNOSIS — I10 Essential (primary) hypertension: Secondary | ICD-10-CM

## 2015-05-23 DIAGNOSIS — E113513 Type 2 diabetes mellitus with proliferative diabetic retinopathy with macular edema, bilateral: Secondary | ICD-10-CM | POA: Diagnosis not present

## 2015-08-02 DIAGNOSIS — Z6830 Body mass index (BMI) 30.0-30.9, adult: Secondary | ICD-10-CM | POA: Diagnosis not present

## 2015-08-02 DIAGNOSIS — F332 Major depressive disorder, recurrent severe without psychotic features: Secondary | ICD-10-CM | POA: Diagnosis not present

## 2015-08-02 DIAGNOSIS — E1129 Type 2 diabetes mellitus with other diabetic kidney complication: Secondary | ICD-10-CM | POA: Diagnosis not present

## 2015-08-02 DIAGNOSIS — N2581 Secondary hyperparathyroidism of renal origin: Secondary | ICD-10-CM | POA: Diagnosis not present

## 2015-08-02 DIAGNOSIS — L409 Psoriasis, unspecified: Secondary | ICD-10-CM | POA: Diagnosis not present

## 2015-08-02 DIAGNOSIS — N184 Chronic kidney disease, stage 4 (severe): Secondary | ICD-10-CM | POA: Diagnosis not present

## 2015-08-02 DIAGNOSIS — E875 Hyperkalemia: Secondary | ICD-10-CM | POA: Diagnosis not present

## 2015-08-22 DIAGNOSIS — M546 Pain in thoracic spine: Secondary | ICD-10-CM | POA: Diagnosis not present

## 2015-08-22 DIAGNOSIS — M545 Low back pain: Secondary | ICD-10-CM | POA: Diagnosis not present

## 2015-08-22 DIAGNOSIS — M5412 Radiculopathy, cervical region: Secondary | ICD-10-CM | POA: Diagnosis not present

## 2015-08-23 ENCOUNTER — Encounter (INDEPENDENT_AMBULATORY_CARE_PROVIDER_SITE_OTHER): Payer: BLUE CROSS/BLUE SHIELD | Admitting: Ophthalmology

## 2015-08-23 DIAGNOSIS — H43813 Vitreous degeneration, bilateral: Secondary | ICD-10-CM | POA: Diagnosis not present

## 2015-08-23 DIAGNOSIS — H4313 Vitreous hemorrhage, bilateral: Secondary | ICD-10-CM | POA: Diagnosis not present

## 2015-08-23 DIAGNOSIS — E113513 Type 2 diabetes mellitus with proliferative diabetic retinopathy with macular edema, bilateral: Secondary | ICD-10-CM | POA: Diagnosis not present

## 2015-08-23 DIAGNOSIS — E11311 Type 2 diabetes mellitus with unspecified diabetic retinopathy with macular edema: Secondary | ICD-10-CM | POA: Diagnosis not present

## 2015-08-27 DIAGNOSIS — M542 Cervicalgia: Secondary | ICD-10-CM | POA: Diagnosis not present

## 2015-08-27 DIAGNOSIS — M546 Pain in thoracic spine: Secondary | ICD-10-CM | POA: Diagnosis not present

## 2015-08-29 DIAGNOSIS — G540 Brachial plexus disorders: Secondary | ICD-10-CM | POA: Diagnosis not present

## 2015-10-13 ENCOUNTER — Encounter: Payer: BLUE CROSS/BLUE SHIELD | Admitting: Diagnostic Neuroimaging

## 2015-11-07 DIAGNOSIS — L409 Psoriasis, unspecified: Secondary | ICD-10-CM | POA: Diagnosis not present

## 2015-11-07 DIAGNOSIS — E1129 Type 2 diabetes mellitus with other diabetic kidney complication: Secondary | ICD-10-CM | POA: Diagnosis not present

## 2015-11-07 DIAGNOSIS — Z23 Encounter for immunization: Secondary | ICD-10-CM | POA: Diagnosis not present

## 2015-11-07 DIAGNOSIS — F332 Major depressive disorder, recurrent severe without psychotic features: Secondary | ICD-10-CM | POA: Diagnosis not present

## 2015-11-07 DIAGNOSIS — D631 Anemia in chronic kidney disease: Secondary | ICD-10-CM | POA: Diagnosis not present

## 2015-11-07 DIAGNOSIS — I10 Essential (primary) hypertension: Secondary | ICD-10-CM | POA: Diagnosis not present

## 2015-11-07 DIAGNOSIS — Z6829 Body mass index (BMI) 29.0-29.9, adult: Secondary | ICD-10-CM | POA: Diagnosis not present

## 2015-11-07 DIAGNOSIS — N184 Chronic kidney disease, stage 4 (severe): Secondary | ICD-10-CM | POA: Diagnosis not present

## 2015-11-07 DIAGNOSIS — N2581 Secondary hyperparathyroidism of renal origin: Secondary | ICD-10-CM | POA: Diagnosis not present

## 2015-11-23 ENCOUNTER — Encounter (INDEPENDENT_AMBULATORY_CARE_PROVIDER_SITE_OTHER): Payer: BLUE CROSS/BLUE SHIELD | Admitting: Ophthalmology

## 2015-12-15 ENCOUNTER — Encounter (INDEPENDENT_AMBULATORY_CARE_PROVIDER_SITE_OTHER): Payer: BLUE CROSS/BLUE SHIELD | Admitting: Ophthalmology

## 2015-12-23 ENCOUNTER — Encounter (INDEPENDENT_AMBULATORY_CARE_PROVIDER_SITE_OTHER): Payer: BLUE CROSS/BLUE SHIELD | Admitting: Ophthalmology

## 2016-02-02 ENCOUNTER — Encounter (INDEPENDENT_AMBULATORY_CARE_PROVIDER_SITE_OTHER): Payer: BLUE CROSS/BLUE SHIELD | Admitting: Ophthalmology

## 2016-02-29 ENCOUNTER — Encounter (INDEPENDENT_AMBULATORY_CARE_PROVIDER_SITE_OTHER): Payer: BLUE CROSS/BLUE SHIELD | Admitting: Ophthalmology

## 2016-02-29 DIAGNOSIS — H2512 Age-related nuclear cataract, left eye: Secondary | ICD-10-CM

## 2016-02-29 DIAGNOSIS — H35033 Hypertensive retinopathy, bilateral: Secondary | ICD-10-CM | POA: Diagnosis not present

## 2016-02-29 DIAGNOSIS — H4312 Vitreous hemorrhage, left eye: Secondary | ICD-10-CM

## 2016-02-29 DIAGNOSIS — E11319 Type 2 diabetes mellitus with unspecified diabetic retinopathy without macular edema: Secondary | ICD-10-CM | POA: Diagnosis not present

## 2016-02-29 DIAGNOSIS — H43813 Vitreous degeneration, bilateral: Secondary | ICD-10-CM

## 2016-02-29 DIAGNOSIS — I10 Essential (primary) hypertension: Secondary | ICD-10-CM | POA: Diagnosis not present

## 2016-02-29 DIAGNOSIS — E113593 Type 2 diabetes mellitus with proliferative diabetic retinopathy without macular edema, bilateral: Secondary | ICD-10-CM

## 2016-04-17 DIAGNOSIS — E1129 Type 2 diabetes mellitus with other diabetic kidney complication: Secondary | ICD-10-CM | POA: Diagnosis not present

## 2016-04-17 DIAGNOSIS — R8299 Other abnormal findings in urine: Secondary | ICD-10-CM | POA: Diagnosis not present

## 2016-04-17 DIAGNOSIS — N184 Chronic kidney disease, stage 4 (severe): Secondary | ICD-10-CM | POA: Diagnosis not present

## 2016-04-17 DIAGNOSIS — I10 Essential (primary) hypertension: Secondary | ICD-10-CM | POA: Diagnosis not present

## 2016-04-17 DIAGNOSIS — Z125 Encounter for screening for malignant neoplasm of prostate: Secondary | ICD-10-CM | POA: Diagnosis not present

## 2016-04-24 DIAGNOSIS — E11319 Type 2 diabetes mellitus with unspecified diabetic retinopathy without macular edema: Secondary | ICD-10-CM | POA: Diagnosis not present

## 2016-04-24 DIAGNOSIS — Z1389 Encounter for screening for other disorder: Secondary | ICD-10-CM | POA: Diagnosis not present

## 2016-04-24 DIAGNOSIS — Z0000 Encounter for general adult medical examination without abnormal findings: Secondary | ICD-10-CM | POA: Diagnosis not present

## 2016-04-24 DIAGNOSIS — Z6828 Body mass index (BMI) 28.0-28.9, adult: Secondary | ICD-10-CM | POA: Diagnosis not present

## 2016-04-24 DIAGNOSIS — R808 Other proteinuria: Secondary | ICD-10-CM | POA: Diagnosis not present

## 2016-04-24 DIAGNOSIS — E1129 Type 2 diabetes mellitus with other diabetic kidney complication: Secondary | ICD-10-CM | POA: Diagnosis not present

## 2016-04-24 DIAGNOSIS — N184 Chronic kidney disease, stage 4 (severe): Secondary | ICD-10-CM | POA: Diagnosis not present

## 2016-05-09 DIAGNOSIS — N184 Chronic kidney disease, stage 4 (severe): Secondary | ICD-10-CM | POA: Diagnosis not present

## 2016-05-09 DIAGNOSIS — I10 Essential (primary) hypertension: Secondary | ICD-10-CM | POA: Diagnosis not present

## 2016-05-09 DIAGNOSIS — Z6828 Body mass index (BMI) 28.0-28.9, adult: Secondary | ICD-10-CM | POA: Diagnosis not present

## 2016-05-28 ENCOUNTER — Encounter (INDEPENDENT_AMBULATORY_CARE_PROVIDER_SITE_OTHER): Payer: BLUE CROSS/BLUE SHIELD | Admitting: Ophthalmology

## 2016-07-30 ENCOUNTER — Encounter (INDEPENDENT_AMBULATORY_CARE_PROVIDER_SITE_OTHER): Payer: BLUE CROSS/BLUE SHIELD | Admitting: Ophthalmology

## 2016-10-15 DIAGNOSIS — D631 Anemia in chronic kidney disease: Secondary | ICD-10-CM | POA: Diagnosis not present

## 2016-10-15 DIAGNOSIS — N184 Chronic kidney disease, stage 4 (severe): Secondary | ICD-10-CM | POA: Diagnosis not present

## 2016-10-15 DIAGNOSIS — N2581 Secondary hyperparathyroidism of renal origin: Secondary | ICD-10-CM | POA: Diagnosis not present

## 2016-10-15 DIAGNOSIS — I10 Essential (primary) hypertension: Secondary | ICD-10-CM | POA: Diagnosis not present

## 2016-10-26 DIAGNOSIS — Z6828 Body mass index (BMI) 28.0-28.9, adult: Secondary | ICD-10-CM | POA: Diagnosis not present

## 2016-10-26 DIAGNOSIS — N2581 Secondary hyperparathyroidism of renal origin: Secondary | ICD-10-CM | POA: Diagnosis not present

## 2016-10-26 DIAGNOSIS — Z23 Encounter for immunization: Secondary | ICD-10-CM | POA: Diagnosis not present

## 2016-10-26 DIAGNOSIS — E1140 Type 2 diabetes mellitus with diabetic neuropathy, unspecified: Secondary | ICD-10-CM | POA: Diagnosis not present

## 2016-10-26 DIAGNOSIS — E1129 Type 2 diabetes mellitus with other diabetic kidney complication: Secondary | ICD-10-CM | POA: Diagnosis not present

## 2016-10-26 DIAGNOSIS — D631 Anemia in chronic kidney disease: Secondary | ICD-10-CM | POA: Diagnosis not present

## 2016-11-02 DIAGNOSIS — N2581 Secondary hyperparathyroidism of renal origin: Secondary | ICD-10-CM | POA: Diagnosis not present

## 2016-11-02 DIAGNOSIS — D631 Anemia in chronic kidney disease: Secondary | ICD-10-CM | POA: Diagnosis not present

## 2016-11-02 DIAGNOSIS — N184 Chronic kidney disease, stage 4 (severe): Secondary | ICD-10-CM | POA: Diagnosis not present

## 2016-11-02 DIAGNOSIS — I10 Essential (primary) hypertension: Secondary | ICD-10-CM | POA: Diagnosis not present

## 2016-12-05 DIAGNOSIS — N184 Chronic kidney disease, stage 4 (severe): Secondary | ICD-10-CM | POA: Diagnosis not present

## 2016-12-05 DIAGNOSIS — D631 Anemia in chronic kidney disease: Secondary | ICD-10-CM | POA: Diagnosis not present

## 2016-12-05 DIAGNOSIS — N2581 Secondary hyperparathyroidism of renal origin: Secondary | ICD-10-CM | POA: Diagnosis not present

## 2016-12-14 DIAGNOSIS — I129 Hypertensive chronic kidney disease with stage 1 through stage 4 chronic kidney disease, or unspecified chronic kidney disease: Secondary | ICD-10-CM | POA: Diagnosis not present

## 2016-12-14 DIAGNOSIS — N184 Chronic kidney disease, stage 4 (severe): Secondary | ICD-10-CM | POA: Diagnosis not present

## 2016-12-14 DIAGNOSIS — D631 Anemia in chronic kidney disease: Secondary | ICD-10-CM | POA: Diagnosis not present

## 2016-12-14 DIAGNOSIS — N2581 Secondary hyperparathyroidism of renal origin: Secondary | ICD-10-CM | POA: Diagnosis not present

## 2017-01-15 DIAGNOSIS — N289 Disorder of kidney and ureter, unspecified: Secondary | ICD-10-CM

## 2017-01-15 HISTORY — DX: Disorder of kidney and ureter, unspecified: N28.9

## 2017-01-15 HISTORY — PX: LAPAROSCOPIC ASSISTED VENTRAL HERNIA REPAIR: SHX6312

## 2017-01-31 DIAGNOSIS — D631 Anemia in chronic kidney disease: Secondary | ICD-10-CM | POA: Diagnosis not present

## 2017-01-31 DIAGNOSIS — E569 Vitamin deficiency, unspecified: Secondary | ICD-10-CM | POA: Diagnosis not present

## 2017-01-31 DIAGNOSIS — N184 Chronic kidney disease, stage 4 (severe): Secondary | ICD-10-CM | POA: Diagnosis not present

## 2017-01-31 DIAGNOSIS — D630 Anemia in neoplastic disease: Secondary | ICD-10-CM | POA: Diagnosis not present

## 2017-02-05 DIAGNOSIS — I129 Hypertensive chronic kidney disease with stage 1 through stage 4 chronic kidney disease, or unspecified chronic kidney disease: Secondary | ICD-10-CM | POA: Diagnosis not present

## 2017-02-05 DIAGNOSIS — N2581 Secondary hyperparathyroidism of renal origin: Secondary | ICD-10-CM | POA: Diagnosis not present

## 2017-02-05 DIAGNOSIS — N184 Chronic kidney disease, stage 4 (severe): Secondary | ICD-10-CM | POA: Diagnosis not present

## 2017-02-05 DIAGNOSIS — D631 Anemia in chronic kidney disease: Secondary | ICD-10-CM | POA: Diagnosis not present

## 2017-04-17 DIAGNOSIS — N2581 Secondary hyperparathyroidism of renal origin: Secondary | ICD-10-CM | POA: Diagnosis not present

## 2017-04-17 DIAGNOSIS — D631 Anemia in chronic kidney disease: Secondary | ICD-10-CM | POA: Diagnosis not present

## 2017-04-17 DIAGNOSIS — N184 Chronic kidney disease, stage 4 (severe): Secondary | ICD-10-CM | POA: Diagnosis not present

## 2017-04-22 DIAGNOSIS — N2581 Secondary hyperparathyroidism of renal origin: Secondary | ICD-10-CM | POA: Diagnosis not present

## 2017-04-22 DIAGNOSIS — D631 Anemia in chronic kidney disease: Secondary | ICD-10-CM | POA: Diagnosis not present

## 2017-04-22 DIAGNOSIS — I120 Hypertensive chronic kidney disease with stage 5 chronic kidney disease or end stage renal disease: Secondary | ICD-10-CM | POA: Diagnosis not present

## 2017-04-22 DIAGNOSIS — N185 Chronic kidney disease, stage 5: Secondary | ICD-10-CM | POA: Diagnosis not present

## 2017-04-24 DIAGNOSIS — E1129 Type 2 diabetes mellitus with other diabetic kidney complication: Secondary | ICD-10-CM | POA: Diagnosis not present

## 2017-04-24 DIAGNOSIS — E7849 Other hyperlipidemia: Secondary | ICD-10-CM | POA: Diagnosis not present

## 2017-04-24 DIAGNOSIS — Z125 Encounter for screening for malignant neoplasm of prostate: Secondary | ICD-10-CM | POA: Diagnosis not present

## 2017-04-24 DIAGNOSIS — R82998 Other abnormal findings in urine: Secondary | ICD-10-CM | POA: Diagnosis not present

## 2017-05-02 DIAGNOSIS — Z0000 Encounter for general adult medical examination without abnormal findings: Secondary | ICD-10-CM | POA: Diagnosis not present

## 2017-05-02 DIAGNOSIS — E1140 Type 2 diabetes mellitus with diabetic neuropathy, unspecified: Secondary | ICD-10-CM | POA: Diagnosis not present

## 2017-05-02 DIAGNOSIS — Z1389 Encounter for screening for other disorder: Secondary | ICD-10-CM | POA: Diagnosis not present

## 2017-05-02 DIAGNOSIS — I10 Essential (primary) hypertension: Secondary | ICD-10-CM | POA: Diagnosis not present

## 2017-05-02 DIAGNOSIS — E11319 Type 2 diabetes mellitus with unspecified diabetic retinopathy without macular edema: Secondary | ICD-10-CM | POA: Diagnosis not present

## 2017-05-02 DIAGNOSIS — E1129 Type 2 diabetes mellitus with other diabetic kidney complication: Secondary | ICD-10-CM | POA: Diagnosis not present

## 2017-05-02 DIAGNOSIS — Z23 Encounter for immunization: Secondary | ICD-10-CM | POA: Diagnosis not present

## 2017-07-22 DIAGNOSIS — N2581 Secondary hyperparathyroidism of renal origin: Secondary | ICD-10-CM | POA: Diagnosis not present

## 2017-07-22 DIAGNOSIS — N185 Chronic kidney disease, stage 5: Secondary | ICD-10-CM | POA: Diagnosis not present

## 2017-07-22 DIAGNOSIS — D631 Anemia in chronic kidney disease: Secondary | ICD-10-CM | POA: Diagnosis not present

## 2017-07-24 DIAGNOSIS — I120 Hypertensive chronic kidney disease with stage 5 chronic kidney disease or end stage renal disease: Secondary | ICD-10-CM | POA: Diagnosis not present

## 2017-07-24 DIAGNOSIS — N185 Chronic kidney disease, stage 5: Secondary | ICD-10-CM | POA: Diagnosis not present

## 2017-07-24 DIAGNOSIS — N2581 Secondary hyperparathyroidism of renal origin: Secondary | ICD-10-CM | POA: Diagnosis not present

## 2017-07-24 DIAGNOSIS — D631 Anemia in chronic kidney disease: Secondary | ICD-10-CM | POA: Diagnosis not present

## 2017-08-30 DIAGNOSIS — D631 Anemia in chronic kidney disease: Secondary | ICD-10-CM | POA: Diagnosis not present

## 2017-08-30 DIAGNOSIS — N2581 Secondary hyperparathyroidism of renal origin: Secondary | ICD-10-CM | POA: Diagnosis not present

## 2017-08-30 DIAGNOSIS — N185 Chronic kidney disease, stage 5: Secondary | ICD-10-CM | POA: Diagnosis not present

## 2017-09-04 DIAGNOSIS — N2581 Secondary hyperparathyroidism of renal origin: Secondary | ICD-10-CM | POA: Diagnosis not present

## 2017-09-04 DIAGNOSIS — I120 Hypertensive chronic kidney disease with stage 5 chronic kidney disease or end stage renal disease: Secondary | ICD-10-CM | POA: Diagnosis not present

## 2017-09-04 DIAGNOSIS — D631 Anemia in chronic kidney disease: Secondary | ICD-10-CM | POA: Diagnosis not present

## 2017-09-04 DIAGNOSIS — N185 Chronic kidney disease, stage 5: Secondary | ICD-10-CM | POA: Diagnosis not present

## 2017-09-26 ENCOUNTER — Inpatient Hospital Stay (HOSPITAL_COMMUNITY)
Admission: RE | Admit: 2017-09-26 | Discharge: 2017-09-26 | Disposition: A | Discharge status: Home / Self Care | Payer: BLUE CROSS/BLUE SHIELD | Source: Ambulatory Visit | Attending: Nephrology | Admitting: Nephrology

## 2017-10-02 DIAGNOSIS — N185 Chronic kidney disease, stage 5: Secondary | ICD-10-CM | POA: Diagnosis not present

## 2017-10-04 DIAGNOSIS — I120 Hypertensive chronic kidney disease with stage 5 chronic kidney disease or end stage renal disease: Secondary | ICD-10-CM | POA: Diagnosis not present

## 2017-10-04 DIAGNOSIS — N2581 Secondary hyperparathyroidism of renal origin: Secondary | ICD-10-CM | POA: Diagnosis not present

## 2017-10-04 DIAGNOSIS — D631 Anemia in chronic kidney disease: Secondary | ICD-10-CM | POA: Diagnosis not present

## 2017-10-04 DIAGNOSIS — N185 Chronic kidney disease, stage 5: Secondary | ICD-10-CM | POA: Diagnosis not present

## 2017-10-15 ENCOUNTER — Encounter (HOSPITAL_COMMUNITY): Payer: Self-pay

## 2017-10-15 ENCOUNTER — Ambulatory Visit (HOSPITAL_COMMUNITY)
Admission: RE | Admit: 2017-10-15 | Discharge: 2017-10-15 | Disposition: A | Discharge status: Home / Self Care | Payer: BLUE CROSS/BLUE SHIELD | Source: Ambulatory Visit | Attending: Nephrology | Admitting: Nephrology

## 2017-10-15 VITALS — BP 142/72 | HR 79 | Temp 98.8°F | Resp 20

## 2017-10-15 DIAGNOSIS — N185 Chronic kidney disease, stage 5: Secondary | ICD-10-CM | POA: Diagnosis not present

## 2017-10-15 DIAGNOSIS — D631 Anemia in chronic kidney disease: Secondary | ICD-10-CM | POA: Insufficient documentation

## 2017-10-15 DIAGNOSIS — N179 Acute kidney failure, unspecified: Secondary | ICD-10-CM

## 2017-10-15 LAB — POCT HEMOGLOBIN-HEMACUE: HEMOGLOBIN: 8.3 g/dL — AB (ref 13.0–17.0)

## 2017-10-15 MED ORDER — DARBEPOETIN ALFA 60 MCG/0.3ML IJ SOSY
PREFILLED_SYRINGE | INTRAMUSCULAR | Status: AC
  Administered 2017-10-15: 60 ug via SUBCUTANEOUS
  Filled 2017-10-15: qty 0.3

## 2017-10-15 MED ORDER — DARBEPOETIN ALFA 60 MCG/0.3ML IJ SOSY
60.0000 ug | PREFILLED_SYRINGE | INTRAMUSCULAR | Status: DC
  Administered 2017-10-15: 60 ug via SUBCUTANEOUS

## 2017-10-15 NOTE — Discharge Instructions (Signed)

## 2017-10-23 DIAGNOSIS — N184 Chronic kidney disease, stage 4 (severe): Secondary | ICD-10-CM | POA: Diagnosis not present

## 2017-10-23 DIAGNOSIS — K432 Incisional hernia without obstruction or gangrene: Secondary | ICD-10-CM | POA: Diagnosis not present

## 2017-10-23 DIAGNOSIS — N186 End stage renal disease: Secondary | ICD-10-CM | POA: Diagnosis not present

## 2017-10-23 DIAGNOSIS — N185 Chronic kidney disease, stage 5: Secondary | ICD-10-CM | POA: Diagnosis not present

## 2017-10-24 ENCOUNTER — Encounter (HOSPITAL_COMMUNITY): Payer: BLUE CROSS/BLUE SHIELD

## 2017-10-29 DIAGNOSIS — E11319 Type 2 diabetes mellitus with unspecified diabetic retinopathy without macular edema: Secondary | ICD-10-CM | POA: Diagnosis not present

## 2017-10-29 DIAGNOSIS — N2581 Secondary hyperparathyroidism of renal origin: Secondary | ICD-10-CM | POA: Diagnosis not present

## 2017-10-29 DIAGNOSIS — E46 Unspecified protein-calorie malnutrition: Secondary | ICD-10-CM | POA: Diagnosis not present

## 2017-10-29 DIAGNOSIS — E1129 Type 2 diabetes mellitus with other diabetic kidney complication: Secondary | ICD-10-CM | POA: Diagnosis not present

## 2017-10-29 DIAGNOSIS — F332 Major depressive disorder, recurrent severe without psychotic features: Secondary | ICD-10-CM | POA: Diagnosis not present

## 2017-11-04 DIAGNOSIS — N2581 Secondary hyperparathyroidism of renal origin: Secondary | ICD-10-CM | POA: Diagnosis not present

## 2017-11-04 DIAGNOSIS — N185 Chronic kidney disease, stage 5: Secondary | ICD-10-CM | POA: Diagnosis not present

## 2017-11-04 DIAGNOSIS — D631 Anemia in chronic kidney disease: Secondary | ICD-10-CM | POA: Diagnosis not present

## 2017-11-05 DIAGNOSIS — N2581 Secondary hyperparathyroidism of renal origin: Secondary | ICD-10-CM | POA: Diagnosis not present

## 2017-11-05 DIAGNOSIS — I120 Hypertensive chronic kidney disease with stage 5 chronic kidney disease or end stage renal disease: Secondary | ICD-10-CM | POA: Diagnosis not present

## 2017-11-05 DIAGNOSIS — N185 Chronic kidney disease, stage 5: Secondary | ICD-10-CM | POA: Diagnosis not present

## 2017-11-05 DIAGNOSIS — D631 Anemia in chronic kidney disease: Secondary | ICD-10-CM | POA: Diagnosis not present

## 2017-11-12 ENCOUNTER — Ambulatory Visit (HOSPITAL_COMMUNITY)
Admission: RE | Admit: 2017-11-12 | Discharge: 2017-11-12 | Disposition: A | Discharge status: Home / Self Care | Payer: BLUE CROSS/BLUE SHIELD | Source: Ambulatory Visit | Attending: Nephrology | Admitting: Nephrology

## 2017-11-12 VITALS — BP 146/65 | HR 78 | Temp 98.0°F | Resp 20

## 2017-11-12 DIAGNOSIS — D631 Anemia in chronic kidney disease: Secondary | ICD-10-CM | POA: Insufficient documentation

## 2017-11-12 DIAGNOSIS — N185 Chronic kidney disease, stage 5: Secondary | ICD-10-CM | POA: Insufficient documentation

## 2017-11-12 DIAGNOSIS — Z5181 Encounter for therapeutic drug level monitoring: Secondary | ICD-10-CM | POA: Diagnosis not present

## 2017-11-12 DIAGNOSIS — Z79899 Other long term (current) drug therapy: Secondary | ICD-10-CM | POA: Diagnosis not present

## 2017-11-12 DIAGNOSIS — N179 Acute kidney failure, unspecified: Secondary | ICD-10-CM

## 2017-11-12 LAB — IRON AND TIBC
Iron: 50 ug/dL (ref 45–182)
Saturation Ratios: 20 % (ref 17.9–39.5)
TIBC: 249 ug/dL — ABNORMAL LOW (ref 250–450)
UIBC: 199 ug/dL

## 2017-11-12 LAB — RENAL FUNCTION PANEL
Albumin: 3.1 g/dL — ABNORMAL LOW (ref 3.5–5.0)
Anion gap: 13 (ref 5–15)
BUN: 91 mg/dL — AB (ref 6–20)
CHLORIDE: 110 mmol/L (ref 98–111)
CO2: 16 mmol/L — AB (ref 22–32)
CREATININE: 7.18 mg/dL — AB (ref 0.61–1.24)
Calcium: 8.6 mg/dL — ABNORMAL LOW (ref 8.9–10.3)
GFR calc Af Amer: 9 mL/min — ABNORMAL LOW (ref >60)
GFR, EST NON AFRICAN AMERICAN: 8 mL/min — AB (ref >60)
Glucose, Bld: 136 mg/dL — ABNORMAL HIGH (ref 70–99)
Phosphorus: 7 mg/dL — ABNORMAL HIGH (ref 2.5–4.6)
Potassium: 4.8 mmol/L (ref 3.5–5.1)
SODIUM: 139 mmol/L (ref 135–145)

## 2017-11-12 LAB — POCT HEMOGLOBIN-HEMACUE: HEMOGLOBIN: 8.3 g/dL — AB (ref 13.0–17.0)

## 2017-11-12 MED ORDER — DARBEPOETIN ALFA 60 MCG/0.3ML IJ SOSY
60.0000 ug | PREFILLED_SYRINGE | INTRAMUSCULAR | Status: DC
  Administered 2017-11-12: 60 ug via SUBCUTANEOUS

## 2017-11-12 MED ORDER — DARBEPOETIN ALFA 60 MCG/0.3ML IJ SOSY
PREFILLED_SYRINGE | INTRAMUSCULAR | Status: AC
  Filled 2017-11-12: qty 0.3

## 2017-11-25 DIAGNOSIS — N186 End stage renal disease: Secondary | ICD-10-CM | POA: Diagnosis not present

## 2017-11-25 DIAGNOSIS — D631 Anemia in chronic kidney disease: Secondary | ICD-10-CM | POA: Diagnosis not present

## 2017-11-25 DIAGNOSIS — Z01818 Encounter for other preprocedural examination: Secondary | ICD-10-CM | POA: Diagnosis not present

## 2017-11-28 DIAGNOSIS — Z794 Long term (current) use of insulin: Secondary | ICD-10-CM | POA: Diagnosis not present

## 2017-11-28 DIAGNOSIS — K430 Incisional hernia with obstruction, without gangrene: Secondary | ICD-10-CM | POA: Diagnosis not present

## 2017-11-28 DIAGNOSIS — K432 Incisional hernia without obstruction or gangrene: Secondary | ICD-10-CM | POA: Diagnosis not present

## 2017-11-28 DIAGNOSIS — I120 Hypertensive chronic kidney disease with stage 5 chronic kidney disease or end stage renal disease: Secondary | ICD-10-CM | POA: Diagnosis not present

## 2017-11-28 DIAGNOSIS — N186 End stage renal disease: Secondary | ICD-10-CM | POA: Diagnosis not present

## 2017-11-28 DIAGNOSIS — Z4902 Encounter for fitting and adjustment of peritoneal dialysis catheter: Secondary | ICD-10-CM | POA: Diagnosis not present

## 2017-11-28 DIAGNOSIS — E1122 Type 2 diabetes mellitus with diabetic chronic kidney disease: Secondary | ICD-10-CM | POA: Diagnosis not present

## 2017-11-28 DIAGNOSIS — N185 Chronic kidney disease, stage 5: Secondary | ICD-10-CM | POA: Diagnosis not present

## 2017-11-29 DIAGNOSIS — D631 Anemia in chronic kidney disease: Secondary | ICD-10-CM | POA: Diagnosis not present

## 2017-11-29 DIAGNOSIS — I10 Essential (primary) hypertension: Secondary | ICD-10-CM | POA: Diagnosis not present

## 2017-11-29 DIAGNOSIS — N186 End stage renal disease: Secondary | ICD-10-CM | POA: Diagnosis not present

## 2017-11-29 DIAGNOSIS — Z794 Long term (current) use of insulin: Secondary | ICD-10-CM | POA: Diagnosis not present

## 2017-11-29 DIAGNOSIS — E1122 Type 2 diabetes mellitus with diabetic chronic kidney disease: Secondary | ICD-10-CM | POA: Diagnosis not present

## 2017-11-29 DIAGNOSIS — I120 Hypertensive chronic kidney disease with stage 5 chronic kidney disease or end stage renal disease: Secondary | ICD-10-CM | POA: Diagnosis not present

## 2017-11-29 DIAGNOSIS — K432 Incisional hernia without obstruction or gangrene: Secondary | ICD-10-CM | POA: Diagnosis not present

## 2017-11-30 DIAGNOSIS — E1122 Type 2 diabetes mellitus with diabetic chronic kidney disease: Secondary | ICD-10-CM | POA: Diagnosis not present

## 2017-11-30 DIAGNOSIS — Z794 Long term (current) use of insulin: Secondary | ICD-10-CM | POA: Diagnosis not present

## 2017-11-30 DIAGNOSIS — I120 Hypertensive chronic kidney disease with stage 5 chronic kidney disease or end stage renal disease: Secondary | ICD-10-CM | POA: Diagnosis not present

## 2017-11-30 DIAGNOSIS — I10 Essential (primary) hypertension: Secondary | ICD-10-CM | POA: Diagnosis not present

## 2017-11-30 DIAGNOSIS — K432 Incisional hernia without obstruction or gangrene: Secondary | ICD-10-CM | POA: Diagnosis not present

## 2017-11-30 DIAGNOSIS — D631 Anemia in chronic kidney disease: Secondary | ICD-10-CM | POA: Diagnosis not present

## 2017-11-30 DIAGNOSIS — N186 End stage renal disease: Secondary | ICD-10-CM | POA: Diagnosis not present

## 2017-12-09 ENCOUNTER — Other Ambulatory Visit (HOSPITAL_COMMUNITY): Payer: Self-pay | Admitting: *Deleted

## 2017-12-10 ENCOUNTER — Inpatient Hospital Stay (HOSPITAL_COMMUNITY): Admission: RE | Admit: 2017-12-10 | Payer: BLUE CROSS/BLUE SHIELD | Source: Ambulatory Visit

## 2017-12-15 HISTORY — PX: OTHER SURGICAL HISTORY: SHX169

## 2017-12-23 DIAGNOSIS — N186 End stage renal disease: Secondary | ICD-10-CM | POA: Diagnosis not present

## 2017-12-23 DIAGNOSIS — Z4932 Encounter for adequacy testing for peritoneal dialysis: Secondary | ICD-10-CM | POA: Diagnosis not present

## 2017-12-23 DIAGNOSIS — Z4902 Encounter for fitting and adjustment of peritoneal dialysis catheter: Secondary | ICD-10-CM | POA: Diagnosis not present

## 2017-12-23 DIAGNOSIS — N2581 Secondary hyperparathyroidism of renal origin: Secondary | ICD-10-CM | POA: Diagnosis not present

## 2017-12-23 DIAGNOSIS — Z23 Encounter for immunization: Secondary | ICD-10-CM | POA: Diagnosis not present

## 2017-12-24 DIAGNOSIS — Z23 Encounter for immunization: Secondary | ICD-10-CM | POA: Diagnosis not present

## 2017-12-24 DIAGNOSIS — Z4902 Encounter for fitting and adjustment of peritoneal dialysis catheter: Secondary | ICD-10-CM | POA: Diagnosis not present

## 2017-12-24 DIAGNOSIS — N2581 Secondary hyperparathyroidism of renal origin: Secondary | ICD-10-CM | POA: Diagnosis not present

## 2017-12-24 DIAGNOSIS — N186 End stage renal disease: Secondary | ICD-10-CM | POA: Diagnosis not present

## 2017-12-24 DIAGNOSIS — Z4932 Encounter for adequacy testing for peritoneal dialysis: Secondary | ICD-10-CM | POA: Diagnosis not present

## 2017-12-25 DIAGNOSIS — Z23 Encounter for immunization: Secondary | ICD-10-CM | POA: Diagnosis not present

## 2017-12-25 DIAGNOSIS — Z4902 Encounter for fitting and adjustment of peritoneal dialysis catheter: Secondary | ICD-10-CM | POA: Diagnosis not present

## 2017-12-25 DIAGNOSIS — N186 End stage renal disease: Secondary | ICD-10-CM | POA: Diagnosis not present

## 2017-12-25 DIAGNOSIS — N2581 Secondary hyperparathyroidism of renal origin: Secondary | ICD-10-CM | POA: Diagnosis not present

## 2017-12-25 DIAGNOSIS — Z4932 Encounter for adequacy testing for peritoneal dialysis: Secondary | ICD-10-CM | POA: Diagnosis not present

## 2017-12-26 DIAGNOSIS — Z23 Encounter for immunization: Secondary | ICD-10-CM | POA: Diagnosis not present

## 2017-12-26 DIAGNOSIS — Z4902 Encounter for fitting and adjustment of peritoneal dialysis catheter: Secondary | ICD-10-CM | POA: Diagnosis not present

## 2017-12-26 DIAGNOSIS — Z4932 Encounter for adequacy testing for peritoneal dialysis: Secondary | ICD-10-CM | POA: Diagnosis not present

## 2017-12-26 DIAGNOSIS — N2581 Secondary hyperparathyroidism of renal origin: Secondary | ICD-10-CM | POA: Diagnosis not present

## 2017-12-26 DIAGNOSIS — N186 End stage renal disease: Secondary | ICD-10-CM | POA: Diagnosis not present

## 2017-12-27 DIAGNOSIS — Z4902 Encounter for fitting and adjustment of peritoneal dialysis catheter: Secondary | ICD-10-CM | POA: Diagnosis not present

## 2017-12-27 DIAGNOSIS — Z4932 Encounter for adequacy testing for peritoneal dialysis: Secondary | ICD-10-CM | POA: Diagnosis not present

## 2017-12-27 DIAGNOSIS — N186 End stage renal disease: Secondary | ICD-10-CM | POA: Diagnosis not present

## 2017-12-27 DIAGNOSIS — Z23 Encounter for immunization: Secondary | ICD-10-CM | POA: Diagnosis not present

## 2017-12-27 DIAGNOSIS — N2581 Secondary hyperparathyroidism of renal origin: Secondary | ICD-10-CM | POA: Diagnosis not present

## 2017-12-30 DIAGNOSIS — Z4902 Encounter for fitting and adjustment of peritoneal dialysis catheter: Secondary | ICD-10-CM | POA: Diagnosis not present

## 2017-12-30 DIAGNOSIS — N186 End stage renal disease: Secondary | ICD-10-CM | POA: Diagnosis not present

## 2017-12-30 DIAGNOSIS — N2581 Secondary hyperparathyroidism of renal origin: Secondary | ICD-10-CM | POA: Diagnosis not present

## 2017-12-31 DIAGNOSIS — N2581 Secondary hyperparathyroidism of renal origin: Secondary | ICD-10-CM | POA: Diagnosis not present

## 2017-12-31 DIAGNOSIS — N186 End stage renal disease: Secondary | ICD-10-CM | POA: Diagnosis not present

## 2018-01-01 DIAGNOSIS — Z992 Dependence on renal dialysis: Secondary | ICD-10-CM | POA: Diagnosis not present

## 2018-01-01 DIAGNOSIS — N186 End stage renal disease: Secondary | ICD-10-CM | POA: Diagnosis not present

## 2018-01-01 DIAGNOSIS — N2581 Secondary hyperparathyroidism of renal origin: Secondary | ICD-10-CM | POA: Diagnosis not present

## 2018-01-01 DIAGNOSIS — E1122 Type 2 diabetes mellitus with diabetic chronic kidney disease: Secondary | ICD-10-CM | POA: Diagnosis not present

## 2018-01-02 DIAGNOSIS — N2581 Secondary hyperparathyroidism of renal origin: Secondary | ICD-10-CM | POA: Diagnosis not present

## 2018-01-02 DIAGNOSIS — N186 End stage renal disease: Secondary | ICD-10-CM | POA: Diagnosis not present

## 2018-01-03 DIAGNOSIS — N186 End stage renal disease: Secondary | ICD-10-CM | POA: Diagnosis not present

## 2018-01-03 DIAGNOSIS — N2581 Secondary hyperparathyroidism of renal origin: Secondary | ICD-10-CM | POA: Diagnosis not present

## 2018-01-04 DIAGNOSIS — N186 End stage renal disease: Secondary | ICD-10-CM | POA: Diagnosis not present

## 2018-01-04 DIAGNOSIS — N2581 Secondary hyperparathyroidism of renal origin: Secondary | ICD-10-CM | POA: Diagnosis not present

## 2018-01-05 DIAGNOSIS — N2581 Secondary hyperparathyroidism of renal origin: Secondary | ICD-10-CM | POA: Diagnosis not present

## 2018-01-05 DIAGNOSIS — N186 End stage renal disease: Secondary | ICD-10-CM | POA: Diagnosis not present

## 2018-01-06 DIAGNOSIS — N2581 Secondary hyperparathyroidism of renal origin: Secondary | ICD-10-CM | POA: Diagnosis not present

## 2018-01-06 DIAGNOSIS — N186 End stage renal disease: Secondary | ICD-10-CM | POA: Diagnosis not present

## 2018-01-07 ENCOUNTER — Encounter (HOSPITAL_COMMUNITY): Payer: BLUE CROSS/BLUE SHIELD

## 2018-01-07 DIAGNOSIS — N186 End stage renal disease: Secondary | ICD-10-CM | POA: Diagnosis not present

## 2018-01-07 DIAGNOSIS — N2581 Secondary hyperparathyroidism of renal origin: Secondary | ICD-10-CM | POA: Diagnosis not present

## 2018-01-08 DIAGNOSIS — N2581 Secondary hyperparathyroidism of renal origin: Secondary | ICD-10-CM | POA: Diagnosis not present

## 2018-01-08 DIAGNOSIS — N186 End stage renal disease: Secondary | ICD-10-CM | POA: Diagnosis not present

## 2018-01-09 DIAGNOSIS — N2581 Secondary hyperparathyroidism of renal origin: Secondary | ICD-10-CM | POA: Diagnosis not present

## 2018-01-09 DIAGNOSIS — N186 End stage renal disease: Secondary | ICD-10-CM | POA: Diagnosis not present

## 2018-01-10 DIAGNOSIS — N2581 Secondary hyperparathyroidism of renal origin: Secondary | ICD-10-CM | POA: Diagnosis not present

## 2018-01-10 DIAGNOSIS — N186 End stage renal disease: Secondary | ICD-10-CM | POA: Diagnosis not present

## 2018-01-11 DIAGNOSIS — N186 End stage renal disease: Secondary | ICD-10-CM | POA: Diagnosis not present

## 2018-01-11 DIAGNOSIS — N2581 Secondary hyperparathyroidism of renal origin: Secondary | ICD-10-CM | POA: Diagnosis not present

## 2018-01-12 DIAGNOSIS — N186 End stage renal disease: Secondary | ICD-10-CM | POA: Diagnosis not present

## 2018-01-12 DIAGNOSIS — N2581 Secondary hyperparathyroidism of renal origin: Secondary | ICD-10-CM | POA: Diagnosis not present

## 2018-01-13 DIAGNOSIS — N2581 Secondary hyperparathyroidism of renal origin: Secondary | ICD-10-CM | POA: Diagnosis not present

## 2018-01-13 DIAGNOSIS — N186 End stage renal disease: Secondary | ICD-10-CM | POA: Diagnosis not present

## 2018-01-14 DIAGNOSIS — E1122 Type 2 diabetes mellitus with diabetic chronic kidney disease: Secondary | ICD-10-CM | POA: Diagnosis not present

## 2018-01-14 DIAGNOSIS — N186 End stage renal disease: Secondary | ICD-10-CM | POA: Diagnosis not present

## 2018-01-14 DIAGNOSIS — Z992 Dependence on renal dialysis: Secondary | ICD-10-CM | POA: Diagnosis not present

## 2018-01-14 DIAGNOSIS — N2581 Secondary hyperparathyroidism of renal origin: Secondary | ICD-10-CM | POA: Diagnosis not present

## 2018-01-15 DIAGNOSIS — N2581 Secondary hyperparathyroidism of renal origin: Secondary | ICD-10-CM | POA: Diagnosis not present

## 2018-01-15 DIAGNOSIS — N186 End stage renal disease: Secondary | ICD-10-CM | POA: Diagnosis not present

## 2018-01-16 DIAGNOSIS — N186 End stage renal disease: Secondary | ICD-10-CM | POA: Diagnosis not present

## 2018-01-16 DIAGNOSIS — N2581 Secondary hyperparathyroidism of renal origin: Secondary | ICD-10-CM | POA: Diagnosis not present

## 2018-01-17 DIAGNOSIS — N186 End stage renal disease: Secondary | ICD-10-CM | POA: Diagnosis not present

## 2018-01-17 DIAGNOSIS — N2581 Secondary hyperparathyroidism of renal origin: Secondary | ICD-10-CM | POA: Diagnosis not present

## 2018-01-18 DIAGNOSIS — N186 End stage renal disease: Secondary | ICD-10-CM | POA: Diagnosis not present

## 2018-01-18 DIAGNOSIS — N2581 Secondary hyperparathyroidism of renal origin: Secondary | ICD-10-CM | POA: Diagnosis not present

## 2018-01-19 DIAGNOSIS — N2581 Secondary hyperparathyroidism of renal origin: Secondary | ICD-10-CM | POA: Diagnosis not present

## 2018-01-19 DIAGNOSIS — N186 End stage renal disease: Secondary | ICD-10-CM | POA: Diagnosis not present

## 2018-01-20 DIAGNOSIS — N186 End stage renal disease: Secondary | ICD-10-CM | POA: Diagnosis not present

## 2018-01-20 DIAGNOSIS — N2581 Secondary hyperparathyroidism of renal origin: Secondary | ICD-10-CM | POA: Diagnosis not present

## 2018-01-21 DIAGNOSIS — N186 End stage renal disease: Secondary | ICD-10-CM | POA: Diagnosis not present

## 2018-01-21 DIAGNOSIS — N2581 Secondary hyperparathyroidism of renal origin: Secondary | ICD-10-CM | POA: Diagnosis not present

## 2018-01-22 DIAGNOSIS — N186 End stage renal disease: Secondary | ICD-10-CM | POA: Diagnosis not present

## 2018-01-22 DIAGNOSIS — N2581 Secondary hyperparathyroidism of renal origin: Secondary | ICD-10-CM | POA: Diagnosis not present

## 2018-01-23 DIAGNOSIS — N2581 Secondary hyperparathyroidism of renal origin: Secondary | ICD-10-CM | POA: Diagnosis not present

## 2018-01-23 DIAGNOSIS — N186 End stage renal disease: Secondary | ICD-10-CM | POA: Diagnosis not present

## 2018-01-24 DIAGNOSIS — N2581 Secondary hyperparathyroidism of renal origin: Secondary | ICD-10-CM | POA: Diagnosis not present

## 2018-01-24 DIAGNOSIS — N186 End stage renal disease: Secondary | ICD-10-CM | POA: Diagnosis not present

## 2018-01-25 DIAGNOSIS — N2581 Secondary hyperparathyroidism of renal origin: Secondary | ICD-10-CM | POA: Diagnosis not present

## 2018-01-25 DIAGNOSIS — N186 End stage renal disease: Secondary | ICD-10-CM | POA: Diagnosis not present

## 2018-01-26 DIAGNOSIS — N186 End stage renal disease: Secondary | ICD-10-CM | POA: Diagnosis not present

## 2018-01-26 DIAGNOSIS — N2581 Secondary hyperparathyroidism of renal origin: Secondary | ICD-10-CM | POA: Diagnosis not present

## 2018-01-27 DIAGNOSIS — N2581 Secondary hyperparathyroidism of renal origin: Secondary | ICD-10-CM | POA: Diagnosis not present

## 2018-01-27 DIAGNOSIS — N186 End stage renal disease: Secondary | ICD-10-CM | POA: Diagnosis not present

## 2018-01-28 DIAGNOSIS — N186 End stage renal disease: Secondary | ICD-10-CM | POA: Diagnosis not present

## 2018-01-28 DIAGNOSIS — N2581 Secondary hyperparathyroidism of renal origin: Secondary | ICD-10-CM | POA: Diagnosis not present

## 2018-01-29 DIAGNOSIS — N186 End stage renal disease: Secondary | ICD-10-CM | POA: Diagnosis not present

## 2018-01-29 DIAGNOSIS — N2581 Secondary hyperparathyroidism of renal origin: Secondary | ICD-10-CM | POA: Diagnosis not present

## 2018-01-30 DIAGNOSIS — N186 End stage renal disease: Secondary | ICD-10-CM | POA: Diagnosis not present

## 2018-01-30 DIAGNOSIS — N2581 Secondary hyperparathyroidism of renal origin: Secondary | ICD-10-CM | POA: Diagnosis not present

## 2018-01-31 DIAGNOSIS — N2581 Secondary hyperparathyroidism of renal origin: Secondary | ICD-10-CM | POA: Diagnosis not present

## 2018-01-31 DIAGNOSIS — N186 End stage renal disease: Secondary | ICD-10-CM | POA: Diagnosis not present

## 2018-02-01 DIAGNOSIS — N186 End stage renal disease: Secondary | ICD-10-CM | POA: Diagnosis not present

## 2018-02-01 DIAGNOSIS — N2581 Secondary hyperparathyroidism of renal origin: Secondary | ICD-10-CM | POA: Diagnosis not present

## 2018-02-02 DIAGNOSIS — N186 End stage renal disease: Secondary | ICD-10-CM | POA: Diagnosis not present

## 2018-02-02 DIAGNOSIS — N2581 Secondary hyperparathyroidism of renal origin: Secondary | ICD-10-CM | POA: Diagnosis not present

## 2018-02-03 DIAGNOSIS — N2581 Secondary hyperparathyroidism of renal origin: Secondary | ICD-10-CM | POA: Diagnosis not present

## 2018-02-03 DIAGNOSIS — N186 End stage renal disease: Secondary | ICD-10-CM | POA: Diagnosis not present

## 2018-02-04 DIAGNOSIS — N186 End stage renal disease: Secondary | ICD-10-CM | POA: Diagnosis not present

## 2018-02-04 DIAGNOSIS — N2581 Secondary hyperparathyroidism of renal origin: Secondary | ICD-10-CM | POA: Diagnosis not present

## 2018-02-05 DIAGNOSIS — N186 End stage renal disease: Secondary | ICD-10-CM | POA: Diagnosis not present

## 2018-02-05 DIAGNOSIS — N2581 Secondary hyperparathyroidism of renal origin: Secondary | ICD-10-CM | POA: Diagnosis not present

## 2018-02-06 DIAGNOSIS — N186 End stage renal disease: Secondary | ICD-10-CM | POA: Diagnosis not present

## 2018-02-06 DIAGNOSIS — N2581 Secondary hyperparathyroidism of renal origin: Secondary | ICD-10-CM | POA: Diagnosis not present

## 2018-02-07 DIAGNOSIS — N2581 Secondary hyperparathyroidism of renal origin: Secondary | ICD-10-CM | POA: Diagnosis not present

## 2018-02-07 DIAGNOSIS — N186 End stage renal disease: Secondary | ICD-10-CM | POA: Diagnosis not present

## 2018-02-08 DIAGNOSIS — N186 End stage renal disease: Secondary | ICD-10-CM | POA: Diagnosis not present

## 2018-02-08 DIAGNOSIS — N2581 Secondary hyperparathyroidism of renal origin: Secondary | ICD-10-CM | POA: Diagnosis not present

## 2018-02-09 DIAGNOSIS — N2581 Secondary hyperparathyroidism of renal origin: Secondary | ICD-10-CM | POA: Diagnosis not present

## 2018-02-09 DIAGNOSIS — N186 End stage renal disease: Secondary | ICD-10-CM | POA: Diagnosis not present

## 2018-02-10 DIAGNOSIS — N2581 Secondary hyperparathyroidism of renal origin: Secondary | ICD-10-CM | POA: Diagnosis not present

## 2018-02-10 DIAGNOSIS — N186 End stage renal disease: Secondary | ICD-10-CM | POA: Diagnosis not present

## 2018-02-11 DIAGNOSIS — N2581 Secondary hyperparathyroidism of renal origin: Secondary | ICD-10-CM | POA: Diagnosis not present

## 2018-02-11 DIAGNOSIS — N186 End stage renal disease: Secondary | ICD-10-CM | POA: Diagnosis not present

## 2018-02-12 DIAGNOSIS — N186 End stage renal disease: Secondary | ICD-10-CM | POA: Diagnosis not present

## 2018-02-12 DIAGNOSIS — N2581 Secondary hyperparathyroidism of renal origin: Secondary | ICD-10-CM | POA: Diagnosis not present

## 2018-02-13 DIAGNOSIS — N2581 Secondary hyperparathyroidism of renal origin: Secondary | ICD-10-CM | POA: Diagnosis not present

## 2018-02-13 DIAGNOSIS — N186 End stage renal disease: Secondary | ICD-10-CM | POA: Diagnosis not present

## 2018-02-14 DIAGNOSIS — Z992 Dependence on renal dialysis: Secondary | ICD-10-CM | POA: Diagnosis not present

## 2018-02-14 DIAGNOSIS — E1122 Type 2 diabetes mellitus with diabetic chronic kidney disease: Secondary | ICD-10-CM | POA: Diagnosis not present

## 2018-02-14 DIAGNOSIS — N2581 Secondary hyperparathyroidism of renal origin: Secondary | ICD-10-CM | POA: Diagnosis not present

## 2018-02-14 DIAGNOSIS — N186 End stage renal disease: Secondary | ICD-10-CM | POA: Diagnosis not present

## 2018-02-15 DIAGNOSIS — N186 End stage renal disease: Secondary | ICD-10-CM | POA: Diagnosis not present

## 2018-02-15 DIAGNOSIS — N2581 Secondary hyperparathyroidism of renal origin: Secondary | ICD-10-CM | POA: Diagnosis not present

## 2018-02-16 DIAGNOSIS — N2581 Secondary hyperparathyroidism of renal origin: Secondary | ICD-10-CM | POA: Diagnosis not present

## 2018-02-16 DIAGNOSIS — Z23 Encounter for immunization: Secondary | ICD-10-CM | POA: Diagnosis not present

## 2018-02-16 DIAGNOSIS — N186 End stage renal disease: Secondary | ICD-10-CM | POA: Diagnosis not present

## 2018-02-17 DIAGNOSIS — N2581 Secondary hyperparathyroidism of renal origin: Secondary | ICD-10-CM | POA: Diagnosis not present

## 2018-02-17 DIAGNOSIS — Z23 Encounter for immunization: Secondary | ICD-10-CM | POA: Diagnosis not present

## 2018-02-17 DIAGNOSIS — N186 End stage renal disease: Secondary | ICD-10-CM | POA: Diagnosis not present

## 2018-02-18 DIAGNOSIS — Z23 Encounter for immunization: Secondary | ICD-10-CM | POA: Diagnosis not present

## 2018-02-18 DIAGNOSIS — N2581 Secondary hyperparathyroidism of renal origin: Secondary | ICD-10-CM | POA: Diagnosis not present

## 2018-02-18 DIAGNOSIS — N186 End stage renal disease: Secondary | ICD-10-CM | POA: Diagnosis not present

## 2018-02-19 DIAGNOSIS — Z23 Encounter for immunization: Secondary | ICD-10-CM | POA: Diagnosis not present

## 2018-02-19 DIAGNOSIS — N186 End stage renal disease: Secondary | ICD-10-CM | POA: Diagnosis not present

## 2018-02-19 DIAGNOSIS — N2581 Secondary hyperparathyroidism of renal origin: Secondary | ICD-10-CM | POA: Diagnosis not present

## 2018-02-20 DIAGNOSIS — Z23 Encounter for immunization: Secondary | ICD-10-CM | POA: Diagnosis not present

## 2018-02-20 DIAGNOSIS — N2581 Secondary hyperparathyroidism of renal origin: Secondary | ICD-10-CM | POA: Diagnosis not present

## 2018-02-20 DIAGNOSIS — N186 End stage renal disease: Secondary | ICD-10-CM | POA: Diagnosis not present

## 2018-02-21 DIAGNOSIS — N186 End stage renal disease: Secondary | ICD-10-CM | POA: Diagnosis not present

## 2018-02-21 DIAGNOSIS — Z23 Encounter for immunization: Secondary | ICD-10-CM | POA: Diagnosis not present

## 2018-02-21 DIAGNOSIS — N2581 Secondary hyperparathyroidism of renal origin: Secondary | ICD-10-CM | POA: Diagnosis not present

## 2018-02-22 DIAGNOSIS — N186 End stage renal disease: Secondary | ICD-10-CM | POA: Diagnosis not present

## 2018-02-22 DIAGNOSIS — N2581 Secondary hyperparathyroidism of renal origin: Secondary | ICD-10-CM | POA: Diagnosis not present

## 2018-02-22 DIAGNOSIS — Z23 Encounter for immunization: Secondary | ICD-10-CM | POA: Diagnosis not present

## 2018-02-23 DIAGNOSIS — N2581 Secondary hyperparathyroidism of renal origin: Secondary | ICD-10-CM | POA: Diagnosis not present

## 2018-02-23 DIAGNOSIS — N186 End stage renal disease: Secondary | ICD-10-CM | POA: Diagnosis not present

## 2018-02-24 DIAGNOSIS — N186 End stage renal disease: Secondary | ICD-10-CM | POA: Diagnosis not present

## 2018-02-24 DIAGNOSIS — N2581 Secondary hyperparathyroidism of renal origin: Secondary | ICD-10-CM | POA: Diagnosis not present

## 2018-02-25 DIAGNOSIS — N2581 Secondary hyperparathyroidism of renal origin: Secondary | ICD-10-CM | POA: Diagnosis not present

## 2018-02-25 DIAGNOSIS — N186 End stage renal disease: Secondary | ICD-10-CM | POA: Diagnosis not present

## 2018-02-26 DIAGNOSIS — N186 End stage renal disease: Secondary | ICD-10-CM | POA: Diagnosis not present

## 2018-02-26 DIAGNOSIS — N2581 Secondary hyperparathyroidism of renal origin: Secondary | ICD-10-CM | POA: Diagnosis not present

## 2018-02-27 DIAGNOSIS — N2581 Secondary hyperparathyroidism of renal origin: Secondary | ICD-10-CM | POA: Diagnosis not present

## 2018-02-27 DIAGNOSIS — N186 End stage renal disease: Secondary | ICD-10-CM | POA: Diagnosis not present

## 2018-02-28 DIAGNOSIS — N2581 Secondary hyperparathyroidism of renal origin: Secondary | ICD-10-CM | POA: Diagnosis not present

## 2018-02-28 DIAGNOSIS — N186 End stage renal disease: Secondary | ICD-10-CM | POA: Diagnosis not present

## 2018-03-01 DIAGNOSIS — N2581 Secondary hyperparathyroidism of renal origin: Secondary | ICD-10-CM | POA: Diagnosis not present

## 2018-03-01 DIAGNOSIS — N186 End stage renal disease: Secondary | ICD-10-CM | POA: Diagnosis not present

## 2018-03-02 DIAGNOSIS — N2581 Secondary hyperparathyroidism of renal origin: Secondary | ICD-10-CM | POA: Diagnosis not present

## 2018-03-02 DIAGNOSIS — N186 End stage renal disease: Secondary | ICD-10-CM | POA: Diagnosis not present

## 2018-03-03 DIAGNOSIS — N186 End stage renal disease: Secondary | ICD-10-CM | POA: Diagnosis not present

## 2018-03-03 DIAGNOSIS — N2581 Secondary hyperparathyroidism of renal origin: Secondary | ICD-10-CM | POA: Diagnosis not present

## 2018-03-04 DIAGNOSIS — N186 End stage renal disease: Secondary | ICD-10-CM | POA: Diagnosis not present

## 2018-03-04 DIAGNOSIS — N2581 Secondary hyperparathyroidism of renal origin: Secondary | ICD-10-CM | POA: Diagnosis not present

## 2018-03-05 DIAGNOSIS — N186 End stage renal disease: Secondary | ICD-10-CM | POA: Diagnosis not present

## 2018-03-05 DIAGNOSIS — N2581 Secondary hyperparathyroidism of renal origin: Secondary | ICD-10-CM | POA: Diagnosis not present

## 2018-03-06 DIAGNOSIS — N2581 Secondary hyperparathyroidism of renal origin: Secondary | ICD-10-CM | POA: Diagnosis not present

## 2018-03-06 DIAGNOSIS — N186 End stage renal disease: Secondary | ICD-10-CM | POA: Diagnosis not present

## 2018-03-07 DIAGNOSIS — N186 End stage renal disease: Secondary | ICD-10-CM | POA: Diagnosis not present

## 2018-03-07 DIAGNOSIS — N2581 Secondary hyperparathyroidism of renal origin: Secondary | ICD-10-CM | POA: Diagnosis not present

## 2018-03-08 DIAGNOSIS — N2581 Secondary hyperparathyroidism of renal origin: Secondary | ICD-10-CM | POA: Diagnosis not present

## 2018-03-08 DIAGNOSIS — N186 End stage renal disease: Secondary | ICD-10-CM | POA: Diagnosis not present

## 2018-03-09 DIAGNOSIS — N2581 Secondary hyperparathyroidism of renal origin: Secondary | ICD-10-CM | POA: Diagnosis not present

## 2018-03-09 DIAGNOSIS — N186 End stage renal disease: Secondary | ICD-10-CM | POA: Diagnosis not present

## 2018-03-10 DIAGNOSIS — N186 End stage renal disease: Secondary | ICD-10-CM | POA: Diagnosis not present

## 2018-03-10 DIAGNOSIS — N2581 Secondary hyperparathyroidism of renal origin: Secondary | ICD-10-CM | POA: Diagnosis not present

## 2018-03-11 DIAGNOSIS — N2581 Secondary hyperparathyroidism of renal origin: Secondary | ICD-10-CM | POA: Diagnosis not present

## 2018-03-11 DIAGNOSIS — N186 End stage renal disease: Secondary | ICD-10-CM | POA: Diagnosis not present

## 2018-03-12 DIAGNOSIS — N2581 Secondary hyperparathyroidism of renal origin: Secondary | ICD-10-CM | POA: Diagnosis not present

## 2018-03-12 DIAGNOSIS — N186 End stage renal disease: Secondary | ICD-10-CM | POA: Diagnosis not present

## 2018-03-13 DIAGNOSIS — N186 End stage renal disease: Secondary | ICD-10-CM | POA: Diagnosis not present

## 2018-03-13 DIAGNOSIS — N2581 Secondary hyperparathyroidism of renal origin: Secondary | ICD-10-CM | POA: Diagnosis not present

## 2018-03-14 DIAGNOSIS — N186 End stage renal disease: Secondary | ICD-10-CM | POA: Diagnosis not present

## 2018-03-14 DIAGNOSIS — N2581 Secondary hyperparathyroidism of renal origin: Secondary | ICD-10-CM | POA: Diagnosis not present

## 2018-03-15 DIAGNOSIS — Z992 Dependence on renal dialysis: Secondary | ICD-10-CM | POA: Diagnosis not present

## 2018-03-15 DIAGNOSIS — N2581 Secondary hyperparathyroidism of renal origin: Secondary | ICD-10-CM | POA: Diagnosis not present

## 2018-03-15 DIAGNOSIS — E1122 Type 2 diabetes mellitus with diabetic chronic kidney disease: Secondary | ICD-10-CM | POA: Diagnosis not present

## 2018-03-15 DIAGNOSIS — N186 End stage renal disease: Secondary | ICD-10-CM | POA: Diagnosis not present

## 2018-03-16 DIAGNOSIS — N186 End stage renal disease: Secondary | ICD-10-CM | POA: Diagnosis not present

## 2018-03-16 DIAGNOSIS — N2581 Secondary hyperparathyroidism of renal origin: Secondary | ICD-10-CM | POA: Diagnosis not present

## 2018-03-16 DIAGNOSIS — Z23 Encounter for immunization: Secondary | ICD-10-CM | POA: Diagnosis not present

## 2018-03-17 DIAGNOSIS — N2581 Secondary hyperparathyroidism of renal origin: Secondary | ICD-10-CM | POA: Diagnosis not present

## 2018-03-17 DIAGNOSIS — Z23 Encounter for immunization: Secondary | ICD-10-CM | POA: Diagnosis not present

## 2018-03-17 DIAGNOSIS — N186 End stage renal disease: Secondary | ICD-10-CM | POA: Diagnosis not present

## 2018-03-18 DIAGNOSIS — N2581 Secondary hyperparathyroidism of renal origin: Secondary | ICD-10-CM | POA: Diagnosis not present

## 2018-03-18 DIAGNOSIS — Z23 Encounter for immunization: Secondary | ICD-10-CM | POA: Diagnosis not present

## 2018-03-18 DIAGNOSIS — N186 End stage renal disease: Secondary | ICD-10-CM | POA: Diagnosis not present

## 2018-03-19 DIAGNOSIS — N2581 Secondary hyperparathyroidism of renal origin: Secondary | ICD-10-CM | POA: Diagnosis not present

## 2018-03-19 DIAGNOSIS — Z23 Encounter for immunization: Secondary | ICD-10-CM | POA: Diagnosis not present

## 2018-03-19 DIAGNOSIS — N186 End stage renal disease: Secondary | ICD-10-CM | POA: Diagnosis not present

## 2018-03-20 DIAGNOSIS — N2581 Secondary hyperparathyroidism of renal origin: Secondary | ICD-10-CM | POA: Diagnosis not present

## 2018-03-20 DIAGNOSIS — N186 End stage renal disease: Secondary | ICD-10-CM | POA: Diagnosis not present

## 2018-03-20 DIAGNOSIS — Z23 Encounter for immunization: Secondary | ICD-10-CM | POA: Diagnosis not present

## 2018-03-21 DIAGNOSIS — Z23 Encounter for immunization: Secondary | ICD-10-CM | POA: Diagnosis not present

## 2018-03-21 DIAGNOSIS — N186 End stage renal disease: Secondary | ICD-10-CM | POA: Diagnosis not present

## 2018-03-21 DIAGNOSIS — N2581 Secondary hyperparathyroidism of renal origin: Secondary | ICD-10-CM | POA: Diagnosis not present

## 2018-03-22 DIAGNOSIS — N2581 Secondary hyperparathyroidism of renal origin: Secondary | ICD-10-CM | POA: Diagnosis not present

## 2018-03-22 DIAGNOSIS — N186 End stage renal disease: Secondary | ICD-10-CM | POA: Diagnosis not present

## 2018-03-22 DIAGNOSIS — Z23 Encounter for immunization: Secondary | ICD-10-CM | POA: Diagnosis not present

## 2018-03-23 DIAGNOSIS — N2581 Secondary hyperparathyroidism of renal origin: Secondary | ICD-10-CM | POA: Diagnosis not present

## 2018-03-23 DIAGNOSIS — N186 End stage renal disease: Secondary | ICD-10-CM | POA: Diagnosis not present

## 2018-03-24 DIAGNOSIS — N2581 Secondary hyperparathyroidism of renal origin: Secondary | ICD-10-CM | POA: Diagnosis not present

## 2018-03-24 DIAGNOSIS — N186 End stage renal disease: Secondary | ICD-10-CM | POA: Diagnosis not present

## 2018-03-25 DIAGNOSIS — N186 End stage renal disease: Secondary | ICD-10-CM | POA: Diagnosis not present

## 2018-03-25 DIAGNOSIS — N2581 Secondary hyperparathyroidism of renal origin: Secondary | ICD-10-CM | POA: Diagnosis not present

## 2018-03-26 DIAGNOSIS — N2581 Secondary hyperparathyroidism of renal origin: Secondary | ICD-10-CM | POA: Diagnosis not present

## 2018-03-26 DIAGNOSIS — N186 End stage renal disease: Secondary | ICD-10-CM | POA: Diagnosis not present

## 2018-03-27 DIAGNOSIS — N186 End stage renal disease: Secondary | ICD-10-CM | POA: Diagnosis not present

## 2018-03-27 DIAGNOSIS — N2581 Secondary hyperparathyroidism of renal origin: Secondary | ICD-10-CM | POA: Diagnosis not present

## 2018-03-28 DIAGNOSIS — N186 End stage renal disease: Secondary | ICD-10-CM | POA: Diagnosis not present

## 2018-03-28 DIAGNOSIS — N2581 Secondary hyperparathyroidism of renal origin: Secondary | ICD-10-CM | POA: Diagnosis not present

## 2018-03-29 DIAGNOSIS — N186 End stage renal disease: Secondary | ICD-10-CM | POA: Diagnosis not present

## 2018-03-29 DIAGNOSIS — N2581 Secondary hyperparathyroidism of renal origin: Secondary | ICD-10-CM | POA: Diagnosis not present

## 2018-03-30 DIAGNOSIS — N2581 Secondary hyperparathyroidism of renal origin: Secondary | ICD-10-CM | POA: Diagnosis not present

## 2018-03-30 DIAGNOSIS — N186 End stage renal disease: Secondary | ICD-10-CM | POA: Diagnosis not present

## 2018-03-31 DIAGNOSIS — N2581 Secondary hyperparathyroidism of renal origin: Secondary | ICD-10-CM | POA: Diagnosis not present

## 2018-03-31 DIAGNOSIS — N186 End stage renal disease: Secondary | ICD-10-CM | POA: Diagnosis not present

## 2018-04-01 DIAGNOSIS — N186 End stage renal disease: Secondary | ICD-10-CM | POA: Diagnosis not present

## 2018-04-01 DIAGNOSIS — N2581 Secondary hyperparathyroidism of renal origin: Secondary | ICD-10-CM | POA: Diagnosis not present

## 2018-04-02 DIAGNOSIS — N2581 Secondary hyperparathyroidism of renal origin: Secondary | ICD-10-CM | POA: Diagnosis not present

## 2018-04-02 DIAGNOSIS — N186 End stage renal disease: Secondary | ICD-10-CM | POA: Diagnosis not present

## 2018-04-03 DIAGNOSIS — N2581 Secondary hyperparathyroidism of renal origin: Secondary | ICD-10-CM | POA: Diagnosis not present

## 2018-04-03 DIAGNOSIS — N186 End stage renal disease: Secondary | ICD-10-CM | POA: Diagnosis not present

## 2018-04-04 DIAGNOSIS — N186 End stage renal disease: Secondary | ICD-10-CM | POA: Diagnosis not present

## 2018-04-04 DIAGNOSIS — N2581 Secondary hyperparathyroidism of renal origin: Secondary | ICD-10-CM | POA: Diagnosis not present

## 2018-04-05 DIAGNOSIS — N2581 Secondary hyperparathyroidism of renal origin: Secondary | ICD-10-CM | POA: Diagnosis not present

## 2018-04-05 DIAGNOSIS — N186 End stage renal disease: Secondary | ICD-10-CM | POA: Diagnosis not present

## 2018-04-06 DIAGNOSIS — N186 End stage renal disease: Secondary | ICD-10-CM | POA: Diagnosis not present

## 2018-04-06 DIAGNOSIS — N2581 Secondary hyperparathyroidism of renal origin: Secondary | ICD-10-CM | POA: Diagnosis not present

## 2018-04-07 DIAGNOSIS — N2581 Secondary hyperparathyroidism of renal origin: Secondary | ICD-10-CM | POA: Diagnosis not present

## 2018-04-07 DIAGNOSIS — N186 End stage renal disease: Secondary | ICD-10-CM | POA: Diagnosis not present

## 2018-04-08 DIAGNOSIS — N186 End stage renal disease: Secondary | ICD-10-CM | POA: Diagnosis not present

## 2018-04-08 DIAGNOSIS — N2581 Secondary hyperparathyroidism of renal origin: Secondary | ICD-10-CM | POA: Diagnosis not present

## 2018-04-09 DIAGNOSIS — N2581 Secondary hyperparathyroidism of renal origin: Secondary | ICD-10-CM | POA: Diagnosis not present

## 2018-04-09 DIAGNOSIS — N186 End stage renal disease: Secondary | ICD-10-CM | POA: Diagnosis not present

## 2018-04-10 DIAGNOSIS — N186 End stage renal disease: Secondary | ICD-10-CM | POA: Diagnosis not present

## 2018-04-10 DIAGNOSIS — N2581 Secondary hyperparathyroidism of renal origin: Secondary | ICD-10-CM | POA: Diagnosis not present

## 2018-04-11 DIAGNOSIS — N186 End stage renal disease: Secondary | ICD-10-CM | POA: Diagnosis not present

## 2018-04-11 DIAGNOSIS — N2581 Secondary hyperparathyroidism of renal origin: Secondary | ICD-10-CM | POA: Diagnosis not present

## 2018-04-12 DIAGNOSIS — N186 End stage renal disease: Secondary | ICD-10-CM | POA: Diagnosis not present

## 2018-04-12 DIAGNOSIS — N2581 Secondary hyperparathyroidism of renal origin: Secondary | ICD-10-CM | POA: Diagnosis not present

## 2018-04-13 DIAGNOSIS — N186 End stage renal disease: Secondary | ICD-10-CM | POA: Diagnosis not present

## 2018-04-13 DIAGNOSIS — N2581 Secondary hyperparathyroidism of renal origin: Secondary | ICD-10-CM | POA: Diagnosis not present

## 2018-04-14 DIAGNOSIS — N2581 Secondary hyperparathyroidism of renal origin: Secondary | ICD-10-CM | POA: Diagnosis not present

## 2018-04-14 DIAGNOSIS — N186 End stage renal disease: Secondary | ICD-10-CM | POA: Diagnosis not present

## 2018-04-15 DIAGNOSIS — N186 End stage renal disease: Secondary | ICD-10-CM | POA: Diagnosis not present

## 2018-04-15 DIAGNOSIS — Z992 Dependence on renal dialysis: Secondary | ICD-10-CM | POA: Diagnosis not present

## 2018-04-15 DIAGNOSIS — E1122 Type 2 diabetes mellitus with diabetic chronic kidney disease: Secondary | ICD-10-CM | POA: Diagnosis not present

## 2018-04-15 DIAGNOSIS — N2581 Secondary hyperparathyroidism of renal origin: Secondary | ICD-10-CM | POA: Diagnosis not present

## 2018-04-16 DIAGNOSIS — N186 End stage renal disease: Secondary | ICD-10-CM | POA: Diagnosis not present

## 2018-04-16 DIAGNOSIS — N2581 Secondary hyperparathyroidism of renal origin: Secondary | ICD-10-CM | POA: Diagnosis not present

## 2018-04-17 DIAGNOSIS — N186 End stage renal disease: Secondary | ICD-10-CM | POA: Diagnosis not present

## 2018-04-17 DIAGNOSIS — N2581 Secondary hyperparathyroidism of renal origin: Secondary | ICD-10-CM | POA: Diagnosis not present

## 2018-04-18 DIAGNOSIS — N2581 Secondary hyperparathyroidism of renal origin: Secondary | ICD-10-CM | POA: Diagnosis not present

## 2018-04-18 DIAGNOSIS — N186 End stage renal disease: Secondary | ICD-10-CM | POA: Diagnosis not present

## 2018-04-19 DIAGNOSIS — N2581 Secondary hyperparathyroidism of renal origin: Secondary | ICD-10-CM | POA: Diagnosis not present

## 2018-04-19 DIAGNOSIS — N186 End stage renal disease: Secondary | ICD-10-CM | POA: Diagnosis not present

## 2018-04-20 DIAGNOSIS — N186 End stage renal disease: Secondary | ICD-10-CM | POA: Diagnosis not present

## 2018-04-20 DIAGNOSIS — N2581 Secondary hyperparathyroidism of renal origin: Secondary | ICD-10-CM | POA: Diagnosis not present

## 2018-04-21 DIAGNOSIS — N2581 Secondary hyperparathyroidism of renal origin: Secondary | ICD-10-CM | POA: Diagnosis not present

## 2018-04-21 DIAGNOSIS — N186 End stage renal disease: Secondary | ICD-10-CM | POA: Diagnosis not present

## 2018-04-22 DIAGNOSIS — N186 End stage renal disease: Secondary | ICD-10-CM | POA: Diagnosis not present

## 2018-04-22 DIAGNOSIS — N2581 Secondary hyperparathyroidism of renal origin: Secondary | ICD-10-CM | POA: Diagnosis not present

## 2018-04-23 DIAGNOSIS — N186 End stage renal disease: Secondary | ICD-10-CM | POA: Diagnosis not present

## 2018-04-23 DIAGNOSIS — N2581 Secondary hyperparathyroidism of renal origin: Secondary | ICD-10-CM | POA: Diagnosis not present

## 2018-04-24 DIAGNOSIS — N2581 Secondary hyperparathyroidism of renal origin: Secondary | ICD-10-CM | POA: Diagnosis not present

## 2018-04-24 DIAGNOSIS — N186 End stage renal disease: Secondary | ICD-10-CM | POA: Diagnosis not present

## 2018-04-25 DIAGNOSIS — N2581 Secondary hyperparathyroidism of renal origin: Secondary | ICD-10-CM | POA: Diagnosis not present

## 2018-04-25 DIAGNOSIS — N186 End stage renal disease: Secondary | ICD-10-CM | POA: Diagnosis not present

## 2018-04-26 DIAGNOSIS — N186 End stage renal disease: Secondary | ICD-10-CM | POA: Diagnosis not present

## 2018-04-26 DIAGNOSIS — N2581 Secondary hyperparathyroidism of renal origin: Secondary | ICD-10-CM | POA: Diagnosis not present

## 2018-04-27 DIAGNOSIS — N2581 Secondary hyperparathyroidism of renal origin: Secondary | ICD-10-CM | POA: Diagnosis not present

## 2018-04-27 DIAGNOSIS — N186 End stage renal disease: Secondary | ICD-10-CM | POA: Diagnosis not present

## 2018-04-28 DIAGNOSIS — N2581 Secondary hyperparathyroidism of renal origin: Secondary | ICD-10-CM | POA: Diagnosis not present

## 2018-04-28 DIAGNOSIS — Z125 Encounter for screening for malignant neoplasm of prostate: Secondary | ICD-10-CM | POA: Diagnosis not present

## 2018-04-28 DIAGNOSIS — N186 End stage renal disease: Secondary | ICD-10-CM | POA: Diagnosis not present

## 2018-04-28 DIAGNOSIS — E11319 Type 2 diabetes mellitus with unspecified diabetic retinopathy without macular edema: Secondary | ICD-10-CM | POA: Diagnosis not present

## 2018-04-28 DIAGNOSIS — Z0000 Encounter for general adult medical examination without abnormal findings: Secondary | ICD-10-CM | POA: Diagnosis not present

## 2018-04-29 DIAGNOSIS — N2581 Secondary hyperparathyroidism of renal origin: Secondary | ICD-10-CM | POA: Diagnosis not present

## 2018-04-29 DIAGNOSIS — N186 End stage renal disease: Secondary | ICD-10-CM | POA: Diagnosis not present

## 2018-04-30 DIAGNOSIS — N186 End stage renal disease: Secondary | ICD-10-CM | POA: Diagnosis not present

## 2018-04-30 DIAGNOSIS — N2581 Secondary hyperparathyroidism of renal origin: Secondary | ICD-10-CM | POA: Diagnosis not present

## 2018-05-01 DIAGNOSIS — N186 End stage renal disease: Secondary | ICD-10-CM | POA: Diagnosis not present

## 2018-05-01 DIAGNOSIS — N2581 Secondary hyperparathyroidism of renal origin: Secondary | ICD-10-CM | POA: Diagnosis not present

## 2018-05-02 DIAGNOSIS — N186 End stage renal disease: Secondary | ICD-10-CM | POA: Diagnosis not present

## 2018-05-02 DIAGNOSIS — N2581 Secondary hyperparathyroidism of renal origin: Secondary | ICD-10-CM | POA: Diagnosis not present

## 2018-05-03 DIAGNOSIS — N2581 Secondary hyperparathyroidism of renal origin: Secondary | ICD-10-CM | POA: Diagnosis not present

## 2018-05-03 DIAGNOSIS — N186 End stage renal disease: Secondary | ICD-10-CM | POA: Diagnosis not present

## 2018-05-04 DIAGNOSIS — N186 End stage renal disease: Secondary | ICD-10-CM | POA: Diagnosis not present

## 2018-05-04 DIAGNOSIS — N2581 Secondary hyperparathyroidism of renal origin: Secondary | ICD-10-CM | POA: Diagnosis not present

## 2018-05-05 DIAGNOSIS — E46 Unspecified protein-calorie malnutrition: Secondary | ICD-10-CM | POA: Diagnosis not present

## 2018-05-05 DIAGNOSIS — Z992 Dependence on renal dialysis: Secondary | ICD-10-CM | POA: Diagnosis not present

## 2018-05-05 DIAGNOSIS — E1140 Type 2 diabetes mellitus with diabetic neuropathy, unspecified: Secondary | ICD-10-CM | POA: Diagnosis not present

## 2018-05-05 DIAGNOSIS — Z1331 Encounter for screening for depression: Secondary | ICD-10-CM | POA: Diagnosis not present

## 2018-05-05 DIAGNOSIS — Z0000 Encounter for general adult medical examination without abnormal findings: Secondary | ICD-10-CM | POA: Diagnosis not present

## 2018-05-05 DIAGNOSIS — E11319 Type 2 diabetes mellitus with unspecified diabetic retinopathy without macular edema: Secondary | ICD-10-CM | POA: Diagnosis not present

## 2018-05-05 DIAGNOSIS — N2581 Secondary hyperparathyroidism of renal origin: Secondary | ICD-10-CM | POA: Diagnosis not present

## 2018-05-05 DIAGNOSIS — N186 End stage renal disease: Secondary | ICD-10-CM | POA: Diagnosis not present

## 2018-05-06 DIAGNOSIS — N186 End stage renal disease: Secondary | ICD-10-CM | POA: Diagnosis not present

## 2018-05-06 DIAGNOSIS — N2581 Secondary hyperparathyroidism of renal origin: Secondary | ICD-10-CM | POA: Diagnosis not present

## 2018-05-07 DIAGNOSIS — N2581 Secondary hyperparathyroidism of renal origin: Secondary | ICD-10-CM | POA: Diagnosis not present

## 2018-05-07 DIAGNOSIS — N186 End stage renal disease: Secondary | ICD-10-CM | POA: Diagnosis not present

## 2018-05-08 DIAGNOSIS — N2581 Secondary hyperparathyroidism of renal origin: Secondary | ICD-10-CM | POA: Diagnosis not present

## 2018-05-08 DIAGNOSIS — N186 End stage renal disease: Secondary | ICD-10-CM | POA: Diagnosis not present

## 2018-05-09 DIAGNOSIS — N186 End stage renal disease: Secondary | ICD-10-CM | POA: Diagnosis not present

## 2018-05-09 DIAGNOSIS — N2581 Secondary hyperparathyroidism of renal origin: Secondary | ICD-10-CM | POA: Diagnosis not present

## 2018-05-10 DIAGNOSIS — N186 End stage renal disease: Secondary | ICD-10-CM | POA: Diagnosis not present

## 2018-05-10 DIAGNOSIS — N2581 Secondary hyperparathyroidism of renal origin: Secondary | ICD-10-CM | POA: Diagnosis not present

## 2018-05-11 DIAGNOSIS — N2581 Secondary hyperparathyroidism of renal origin: Secondary | ICD-10-CM | POA: Diagnosis not present

## 2018-05-11 DIAGNOSIS — N186 End stage renal disease: Secondary | ICD-10-CM | POA: Diagnosis not present

## 2018-05-12 DIAGNOSIS — N2581 Secondary hyperparathyroidism of renal origin: Secondary | ICD-10-CM | POA: Diagnosis not present

## 2018-05-12 DIAGNOSIS — N186 End stage renal disease: Secondary | ICD-10-CM | POA: Diagnosis not present

## 2018-05-13 DIAGNOSIS — N2581 Secondary hyperparathyroidism of renal origin: Secondary | ICD-10-CM | POA: Diagnosis not present

## 2018-05-13 DIAGNOSIS — N186 End stage renal disease: Secondary | ICD-10-CM | POA: Diagnosis not present

## 2018-05-14 DIAGNOSIS — N2581 Secondary hyperparathyroidism of renal origin: Secondary | ICD-10-CM | POA: Diagnosis not present

## 2018-05-14 DIAGNOSIS — N186 End stage renal disease: Secondary | ICD-10-CM | POA: Diagnosis not present

## 2018-05-15 DIAGNOSIS — E1122 Type 2 diabetes mellitus with diabetic chronic kidney disease: Secondary | ICD-10-CM | POA: Diagnosis not present

## 2018-05-15 DIAGNOSIS — N2581 Secondary hyperparathyroidism of renal origin: Secondary | ICD-10-CM | POA: Diagnosis not present

## 2018-05-15 DIAGNOSIS — N186 End stage renal disease: Secondary | ICD-10-CM | POA: Diagnosis not present

## 2018-05-15 DIAGNOSIS — Z992 Dependence on renal dialysis: Secondary | ICD-10-CM | POA: Diagnosis not present

## 2018-05-16 DIAGNOSIS — N186 End stage renal disease: Secondary | ICD-10-CM | POA: Diagnosis not present

## 2018-05-16 DIAGNOSIS — N2581 Secondary hyperparathyroidism of renal origin: Secondary | ICD-10-CM | POA: Diagnosis not present

## 2018-05-17 DIAGNOSIS — N2581 Secondary hyperparathyroidism of renal origin: Secondary | ICD-10-CM | POA: Diagnosis not present

## 2018-05-17 DIAGNOSIS — N186 End stage renal disease: Secondary | ICD-10-CM | POA: Diagnosis not present

## 2018-05-18 DIAGNOSIS — N186 End stage renal disease: Secondary | ICD-10-CM | POA: Diagnosis not present

## 2018-05-18 DIAGNOSIS — N2581 Secondary hyperparathyroidism of renal origin: Secondary | ICD-10-CM | POA: Diagnosis not present

## 2018-05-19 DIAGNOSIS — N186 End stage renal disease: Secondary | ICD-10-CM | POA: Diagnosis not present

## 2018-05-19 DIAGNOSIS — N2581 Secondary hyperparathyroidism of renal origin: Secondary | ICD-10-CM | POA: Diagnosis not present

## 2018-05-20 DIAGNOSIS — N2581 Secondary hyperparathyroidism of renal origin: Secondary | ICD-10-CM | POA: Diagnosis not present

## 2018-05-20 DIAGNOSIS — N186 End stage renal disease: Secondary | ICD-10-CM | POA: Diagnosis not present

## 2018-05-21 DIAGNOSIS — N186 End stage renal disease: Secondary | ICD-10-CM | POA: Diagnosis not present

## 2018-05-21 DIAGNOSIS — N2581 Secondary hyperparathyroidism of renal origin: Secondary | ICD-10-CM | POA: Diagnosis not present

## 2018-05-22 DIAGNOSIS — N2581 Secondary hyperparathyroidism of renal origin: Secondary | ICD-10-CM | POA: Diagnosis not present

## 2018-05-22 DIAGNOSIS — N186 End stage renal disease: Secondary | ICD-10-CM | POA: Diagnosis not present

## 2018-05-23 DIAGNOSIS — N186 End stage renal disease: Secondary | ICD-10-CM | POA: Diagnosis not present

## 2018-05-23 DIAGNOSIS — N2581 Secondary hyperparathyroidism of renal origin: Secondary | ICD-10-CM | POA: Diagnosis not present

## 2018-05-24 DIAGNOSIS — N2581 Secondary hyperparathyroidism of renal origin: Secondary | ICD-10-CM | POA: Diagnosis not present

## 2018-05-24 DIAGNOSIS — N186 End stage renal disease: Secondary | ICD-10-CM | POA: Diagnosis not present

## 2018-05-25 DIAGNOSIS — N2581 Secondary hyperparathyroidism of renal origin: Secondary | ICD-10-CM | POA: Diagnosis not present

## 2018-05-25 DIAGNOSIS — N186 End stage renal disease: Secondary | ICD-10-CM | POA: Diagnosis not present

## 2018-05-26 DIAGNOSIS — N186 End stage renal disease: Secondary | ICD-10-CM | POA: Diagnosis not present

## 2018-05-26 DIAGNOSIS — N2581 Secondary hyperparathyroidism of renal origin: Secondary | ICD-10-CM | POA: Diagnosis not present

## 2018-05-27 DIAGNOSIS — N186 End stage renal disease: Secondary | ICD-10-CM | POA: Diagnosis not present

## 2018-05-27 DIAGNOSIS — N2581 Secondary hyperparathyroidism of renal origin: Secondary | ICD-10-CM | POA: Diagnosis not present

## 2018-05-28 DIAGNOSIS — N186 End stage renal disease: Secondary | ICD-10-CM | POA: Diagnosis not present

## 2018-05-28 DIAGNOSIS — N2581 Secondary hyperparathyroidism of renal origin: Secondary | ICD-10-CM | POA: Diagnosis not present

## 2018-05-29 DIAGNOSIS — N186 End stage renal disease: Secondary | ICD-10-CM | POA: Diagnosis not present

## 2018-05-29 DIAGNOSIS — N2581 Secondary hyperparathyroidism of renal origin: Secondary | ICD-10-CM | POA: Diagnosis not present

## 2018-05-30 DIAGNOSIS — N2581 Secondary hyperparathyroidism of renal origin: Secondary | ICD-10-CM | POA: Diagnosis not present

## 2018-05-30 DIAGNOSIS — N186 End stage renal disease: Secondary | ICD-10-CM | POA: Diagnosis not present

## 2018-05-31 DIAGNOSIS — N186 End stage renal disease: Secondary | ICD-10-CM | POA: Diagnosis not present

## 2018-05-31 DIAGNOSIS — N2581 Secondary hyperparathyroidism of renal origin: Secondary | ICD-10-CM | POA: Diagnosis not present

## 2018-06-01 DIAGNOSIS — N186 End stage renal disease: Secondary | ICD-10-CM | POA: Diagnosis not present

## 2018-06-01 DIAGNOSIS — N2581 Secondary hyperparathyroidism of renal origin: Secondary | ICD-10-CM | POA: Diagnosis not present

## 2018-06-02 DIAGNOSIS — N186 End stage renal disease: Secondary | ICD-10-CM | POA: Diagnosis not present

## 2018-06-02 DIAGNOSIS — N2581 Secondary hyperparathyroidism of renal origin: Secondary | ICD-10-CM | POA: Diagnosis not present

## 2018-06-03 DIAGNOSIS — N2581 Secondary hyperparathyroidism of renal origin: Secondary | ICD-10-CM | POA: Diagnosis not present

## 2018-06-03 DIAGNOSIS — N186 End stage renal disease: Secondary | ICD-10-CM | POA: Diagnosis not present

## 2018-06-04 DIAGNOSIS — N2581 Secondary hyperparathyroidism of renal origin: Secondary | ICD-10-CM | POA: Diagnosis not present

## 2018-06-04 DIAGNOSIS — N186 End stage renal disease: Secondary | ICD-10-CM | POA: Diagnosis not present

## 2018-06-05 DIAGNOSIS — N186 End stage renal disease: Secondary | ICD-10-CM | POA: Diagnosis not present

## 2018-06-05 DIAGNOSIS — N2581 Secondary hyperparathyroidism of renal origin: Secondary | ICD-10-CM | POA: Diagnosis not present

## 2018-06-06 DIAGNOSIS — N186 End stage renal disease: Secondary | ICD-10-CM | POA: Diagnosis not present

## 2018-06-06 DIAGNOSIS — N2581 Secondary hyperparathyroidism of renal origin: Secondary | ICD-10-CM | POA: Diagnosis not present

## 2018-06-07 DIAGNOSIS — N2581 Secondary hyperparathyroidism of renal origin: Secondary | ICD-10-CM | POA: Diagnosis not present

## 2018-06-07 DIAGNOSIS — N186 End stage renal disease: Secondary | ICD-10-CM | POA: Diagnosis not present

## 2018-06-08 DIAGNOSIS — N2581 Secondary hyperparathyroidism of renal origin: Secondary | ICD-10-CM | POA: Diagnosis not present

## 2018-06-08 DIAGNOSIS — N186 End stage renal disease: Secondary | ICD-10-CM | POA: Diagnosis not present

## 2018-06-09 DIAGNOSIS — N2581 Secondary hyperparathyroidism of renal origin: Secondary | ICD-10-CM | POA: Diagnosis not present

## 2018-06-09 DIAGNOSIS — N186 End stage renal disease: Secondary | ICD-10-CM | POA: Diagnosis not present

## 2018-06-10 DIAGNOSIS — N186 End stage renal disease: Secondary | ICD-10-CM | POA: Diagnosis not present

## 2018-06-10 DIAGNOSIS — N2581 Secondary hyperparathyroidism of renal origin: Secondary | ICD-10-CM | POA: Diagnosis not present

## 2018-06-11 DIAGNOSIS — N186 End stage renal disease: Secondary | ICD-10-CM | POA: Diagnosis not present

## 2018-06-11 DIAGNOSIS — N2581 Secondary hyperparathyroidism of renal origin: Secondary | ICD-10-CM | POA: Diagnosis not present

## 2018-06-12 DIAGNOSIS — N2581 Secondary hyperparathyroidism of renal origin: Secondary | ICD-10-CM | POA: Diagnosis not present

## 2018-06-12 DIAGNOSIS — N186 End stage renal disease: Secondary | ICD-10-CM | POA: Diagnosis not present

## 2018-06-13 DIAGNOSIS — N186 End stage renal disease: Secondary | ICD-10-CM | POA: Diagnosis not present

## 2018-06-13 DIAGNOSIS — N2581 Secondary hyperparathyroidism of renal origin: Secondary | ICD-10-CM | POA: Diagnosis not present

## 2018-06-14 DIAGNOSIS — N2581 Secondary hyperparathyroidism of renal origin: Secondary | ICD-10-CM | POA: Diagnosis not present

## 2018-06-14 DIAGNOSIS — N186 End stage renal disease: Secondary | ICD-10-CM | POA: Diagnosis not present

## 2018-06-15 DIAGNOSIS — Z992 Dependence on renal dialysis: Secondary | ICD-10-CM | POA: Diagnosis not present

## 2018-06-15 DIAGNOSIS — N2581 Secondary hyperparathyroidism of renal origin: Secondary | ICD-10-CM | POA: Diagnosis not present

## 2018-06-15 DIAGNOSIS — N186 End stage renal disease: Secondary | ICD-10-CM | POA: Diagnosis not present

## 2018-06-15 DIAGNOSIS — E1122 Type 2 diabetes mellitus with diabetic chronic kidney disease: Secondary | ICD-10-CM | POA: Diagnosis not present

## 2018-06-16 DIAGNOSIS — N186 End stage renal disease: Secondary | ICD-10-CM | POA: Diagnosis not present

## 2018-06-16 DIAGNOSIS — N2581 Secondary hyperparathyroidism of renal origin: Secondary | ICD-10-CM | POA: Diagnosis not present

## 2018-06-17 DIAGNOSIS — N186 End stage renal disease: Secondary | ICD-10-CM | POA: Diagnosis not present

## 2018-06-17 DIAGNOSIS — N2581 Secondary hyperparathyroidism of renal origin: Secondary | ICD-10-CM | POA: Diagnosis not present

## 2018-06-18 DIAGNOSIS — N186 End stage renal disease: Secondary | ICD-10-CM | POA: Diagnosis not present

## 2018-06-18 DIAGNOSIS — N2581 Secondary hyperparathyroidism of renal origin: Secondary | ICD-10-CM | POA: Diagnosis not present

## 2018-06-19 DIAGNOSIS — N2581 Secondary hyperparathyroidism of renal origin: Secondary | ICD-10-CM | POA: Diagnosis not present

## 2018-06-19 DIAGNOSIS — N186 End stage renal disease: Secondary | ICD-10-CM | POA: Diagnosis not present

## 2018-06-20 DIAGNOSIS — N2581 Secondary hyperparathyroidism of renal origin: Secondary | ICD-10-CM | POA: Diagnosis not present

## 2018-06-20 DIAGNOSIS — N186 End stage renal disease: Secondary | ICD-10-CM | POA: Diagnosis not present

## 2018-06-21 DIAGNOSIS — N2581 Secondary hyperparathyroidism of renal origin: Secondary | ICD-10-CM | POA: Diagnosis not present

## 2018-06-21 DIAGNOSIS — N186 End stage renal disease: Secondary | ICD-10-CM | POA: Diagnosis not present

## 2018-06-22 DIAGNOSIS — N186 End stage renal disease: Secondary | ICD-10-CM | POA: Diagnosis not present

## 2018-06-22 DIAGNOSIS — N2581 Secondary hyperparathyroidism of renal origin: Secondary | ICD-10-CM | POA: Diagnosis not present

## 2018-06-22 DIAGNOSIS — Z23 Encounter for immunization: Secondary | ICD-10-CM | POA: Diagnosis not present

## 2018-06-23 DIAGNOSIS — Z23 Encounter for immunization: Secondary | ICD-10-CM | POA: Diagnosis not present

## 2018-06-23 DIAGNOSIS — N2581 Secondary hyperparathyroidism of renal origin: Secondary | ICD-10-CM | POA: Diagnosis not present

## 2018-06-23 DIAGNOSIS — N186 End stage renal disease: Secondary | ICD-10-CM | POA: Diagnosis not present

## 2018-06-24 DIAGNOSIS — Z23 Encounter for immunization: Secondary | ICD-10-CM | POA: Diagnosis not present

## 2018-06-24 DIAGNOSIS — N2581 Secondary hyperparathyroidism of renal origin: Secondary | ICD-10-CM | POA: Diagnosis not present

## 2018-06-24 DIAGNOSIS — N186 End stage renal disease: Secondary | ICD-10-CM | POA: Diagnosis not present

## 2018-06-25 DIAGNOSIS — N186 End stage renal disease: Secondary | ICD-10-CM | POA: Diagnosis not present

## 2018-06-25 DIAGNOSIS — Z23 Encounter for immunization: Secondary | ICD-10-CM | POA: Diagnosis not present

## 2018-06-25 DIAGNOSIS — N2581 Secondary hyperparathyroidism of renal origin: Secondary | ICD-10-CM | POA: Diagnosis not present

## 2018-06-26 DIAGNOSIS — N2581 Secondary hyperparathyroidism of renal origin: Secondary | ICD-10-CM | POA: Diagnosis not present

## 2018-06-26 DIAGNOSIS — N186 End stage renal disease: Secondary | ICD-10-CM | POA: Diagnosis not present

## 2018-06-26 DIAGNOSIS — Z23 Encounter for immunization: Secondary | ICD-10-CM | POA: Diagnosis not present

## 2018-06-27 DIAGNOSIS — Z23 Encounter for immunization: Secondary | ICD-10-CM | POA: Diagnosis not present

## 2018-06-27 DIAGNOSIS — N2581 Secondary hyperparathyroidism of renal origin: Secondary | ICD-10-CM | POA: Diagnosis not present

## 2018-06-27 DIAGNOSIS — N186 End stage renal disease: Secondary | ICD-10-CM | POA: Diagnosis not present

## 2018-06-28 DIAGNOSIS — N2581 Secondary hyperparathyroidism of renal origin: Secondary | ICD-10-CM | POA: Diagnosis not present

## 2018-06-28 DIAGNOSIS — Z23 Encounter for immunization: Secondary | ICD-10-CM | POA: Diagnosis not present

## 2018-06-28 DIAGNOSIS — N186 End stage renal disease: Secondary | ICD-10-CM | POA: Diagnosis not present

## 2018-06-29 DIAGNOSIS — N2581 Secondary hyperparathyroidism of renal origin: Secondary | ICD-10-CM | POA: Diagnosis not present

## 2018-06-29 DIAGNOSIS — N186 End stage renal disease: Secondary | ICD-10-CM | POA: Diagnosis not present

## 2018-06-30 DIAGNOSIS — N186 End stage renal disease: Secondary | ICD-10-CM | POA: Diagnosis not present

## 2018-06-30 DIAGNOSIS — N2581 Secondary hyperparathyroidism of renal origin: Secondary | ICD-10-CM | POA: Diagnosis not present

## 2018-07-01 DIAGNOSIS — N2581 Secondary hyperparathyroidism of renal origin: Secondary | ICD-10-CM | POA: Diagnosis not present

## 2018-07-01 DIAGNOSIS — N186 End stage renal disease: Secondary | ICD-10-CM | POA: Diagnosis not present

## 2018-07-02 DIAGNOSIS — N2581 Secondary hyperparathyroidism of renal origin: Secondary | ICD-10-CM | POA: Diagnosis not present

## 2018-07-02 DIAGNOSIS — N186 End stage renal disease: Secondary | ICD-10-CM | POA: Diagnosis not present

## 2018-07-03 DIAGNOSIS — N186 End stage renal disease: Secondary | ICD-10-CM | POA: Diagnosis not present

## 2018-07-03 DIAGNOSIS — N2581 Secondary hyperparathyroidism of renal origin: Secondary | ICD-10-CM | POA: Diagnosis not present

## 2018-07-04 DIAGNOSIS — N2581 Secondary hyperparathyroidism of renal origin: Secondary | ICD-10-CM | POA: Diagnosis not present

## 2018-07-04 DIAGNOSIS — N186 End stage renal disease: Secondary | ICD-10-CM | POA: Diagnosis not present

## 2018-07-05 DIAGNOSIS — N186 End stage renal disease: Secondary | ICD-10-CM | POA: Diagnosis not present

## 2018-07-05 DIAGNOSIS — N2581 Secondary hyperparathyroidism of renal origin: Secondary | ICD-10-CM | POA: Diagnosis not present

## 2018-07-06 DIAGNOSIS — N186 End stage renal disease: Secondary | ICD-10-CM | POA: Diagnosis not present

## 2018-07-06 DIAGNOSIS — N2581 Secondary hyperparathyroidism of renal origin: Secondary | ICD-10-CM | POA: Diagnosis not present

## 2018-07-07 DIAGNOSIS — N186 End stage renal disease: Secondary | ICD-10-CM | POA: Diagnosis not present

## 2018-07-07 DIAGNOSIS — N2581 Secondary hyperparathyroidism of renal origin: Secondary | ICD-10-CM | POA: Diagnosis not present

## 2018-07-08 DIAGNOSIS — N186 End stage renal disease: Secondary | ICD-10-CM | POA: Diagnosis not present

## 2018-07-08 DIAGNOSIS — N2581 Secondary hyperparathyroidism of renal origin: Secondary | ICD-10-CM | POA: Diagnosis not present

## 2018-07-09 DIAGNOSIS — N2581 Secondary hyperparathyroidism of renal origin: Secondary | ICD-10-CM | POA: Diagnosis not present

## 2018-07-09 DIAGNOSIS — N186 End stage renal disease: Secondary | ICD-10-CM | POA: Diagnosis not present

## 2018-07-10 DIAGNOSIS — N186 End stage renal disease: Secondary | ICD-10-CM | POA: Diagnosis not present

## 2018-07-10 DIAGNOSIS — N2581 Secondary hyperparathyroidism of renal origin: Secondary | ICD-10-CM | POA: Diagnosis not present

## 2018-07-11 DIAGNOSIS — N186 End stage renal disease: Secondary | ICD-10-CM | POA: Diagnosis not present

## 2018-07-11 DIAGNOSIS — N2581 Secondary hyperparathyroidism of renal origin: Secondary | ICD-10-CM | POA: Diagnosis not present

## 2018-07-12 DIAGNOSIS — N186 End stage renal disease: Secondary | ICD-10-CM | POA: Diagnosis not present

## 2018-07-12 DIAGNOSIS — N2581 Secondary hyperparathyroidism of renal origin: Secondary | ICD-10-CM | POA: Diagnosis not present

## 2018-07-13 DIAGNOSIS — N186 End stage renal disease: Secondary | ICD-10-CM | POA: Diagnosis not present

## 2018-07-13 DIAGNOSIS — N2581 Secondary hyperparathyroidism of renal origin: Secondary | ICD-10-CM | POA: Diagnosis not present

## 2018-07-14 DIAGNOSIS — N2581 Secondary hyperparathyroidism of renal origin: Secondary | ICD-10-CM | POA: Diagnosis not present

## 2018-07-14 DIAGNOSIS — N186 End stage renal disease: Secondary | ICD-10-CM | POA: Diagnosis not present

## 2018-07-15 DIAGNOSIS — N2581 Secondary hyperparathyroidism of renal origin: Secondary | ICD-10-CM | POA: Diagnosis not present

## 2018-07-15 DIAGNOSIS — Z992 Dependence on renal dialysis: Secondary | ICD-10-CM | POA: Diagnosis not present

## 2018-07-15 DIAGNOSIS — E1122 Type 2 diabetes mellitus with diabetic chronic kidney disease: Secondary | ICD-10-CM | POA: Diagnosis not present

## 2018-07-15 DIAGNOSIS — N186 End stage renal disease: Secondary | ICD-10-CM | POA: Diagnosis not present

## 2018-07-16 DIAGNOSIS — N186 End stage renal disease: Secondary | ICD-10-CM | POA: Diagnosis not present

## 2018-07-16 DIAGNOSIS — N2581 Secondary hyperparathyroidism of renal origin: Secondary | ICD-10-CM | POA: Diagnosis not present

## 2018-07-17 DIAGNOSIS — N186 End stage renal disease: Secondary | ICD-10-CM | POA: Diagnosis not present

## 2018-07-17 DIAGNOSIS — N2581 Secondary hyperparathyroidism of renal origin: Secondary | ICD-10-CM | POA: Diagnosis not present

## 2018-07-18 DIAGNOSIS — N2581 Secondary hyperparathyroidism of renal origin: Secondary | ICD-10-CM | POA: Diagnosis not present

## 2018-07-18 DIAGNOSIS — N186 End stage renal disease: Secondary | ICD-10-CM | POA: Diagnosis not present

## 2018-07-19 DIAGNOSIS — N186 End stage renal disease: Secondary | ICD-10-CM | POA: Diagnosis not present

## 2018-07-19 DIAGNOSIS — N2581 Secondary hyperparathyroidism of renal origin: Secondary | ICD-10-CM | POA: Diagnosis not present

## 2018-07-20 DIAGNOSIS — N186 End stage renal disease: Secondary | ICD-10-CM | POA: Diagnosis not present

## 2018-07-20 DIAGNOSIS — N2581 Secondary hyperparathyroidism of renal origin: Secondary | ICD-10-CM | POA: Diagnosis not present

## 2018-07-21 DIAGNOSIS — N186 End stage renal disease: Secondary | ICD-10-CM | POA: Diagnosis not present

## 2018-07-21 DIAGNOSIS — N2581 Secondary hyperparathyroidism of renal origin: Secondary | ICD-10-CM | POA: Diagnosis not present

## 2018-07-22 DIAGNOSIS — N186 End stage renal disease: Secondary | ICD-10-CM | POA: Diagnosis not present

## 2018-07-22 DIAGNOSIS — N2581 Secondary hyperparathyroidism of renal origin: Secondary | ICD-10-CM | POA: Diagnosis not present

## 2018-07-23 DIAGNOSIS — N186 End stage renal disease: Secondary | ICD-10-CM | POA: Diagnosis not present

## 2018-07-23 DIAGNOSIS — N2581 Secondary hyperparathyroidism of renal origin: Secondary | ICD-10-CM | POA: Diagnosis not present

## 2018-07-24 DIAGNOSIS — N186 End stage renal disease: Secondary | ICD-10-CM | POA: Diagnosis not present

## 2018-07-24 DIAGNOSIS — N2581 Secondary hyperparathyroidism of renal origin: Secondary | ICD-10-CM | POA: Diagnosis not present

## 2018-07-25 DIAGNOSIS — N2581 Secondary hyperparathyroidism of renal origin: Secondary | ICD-10-CM | POA: Diagnosis not present

## 2018-07-25 DIAGNOSIS — N186 End stage renal disease: Secondary | ICD-10-CM | POA: Diagnosis not present

## 2018-07-26 DIAGNOSIS — N186 End stage renal disease: Secondary | ICD-10-CM | POA: Diagnosis not present

## 2018-07-26 DIAGNOSIS — N2581 Secondary hyperparathyroidism of renal origin: Secondary | ICD-10-CM | POA: Diagnosis not present

## 2018-07-27 DIAGNOSIS — N2581 Secondary hyperparathyroidism of renal origin: Secondary | ICD-10-CM | POA: Diagnosis not present

## 2018-07-27 DIAGNOSIS — N186 End stage renal disease: Secondary | ICD-10-CM | POA: Diagnosis not present

## 2018-07-28 DIAGNOSIS — N2581 Secondary hyperparathyroidism of renal origin: Secondary | ICD-10-CM | POA: Diagnosis not present

## 2018-07-28 DIAGNOSIS — N186 End stage renal disease: Secondary | ICD-10-CM | POA: Diagnosis not present

## 2018-07-29 DIAGNOSIS — N2581 Secondary hyperparathyroidism of renal origin: Secondary | ICD-10-CM | POA: Diagnosis not present

## 2018-07-29 DIAGNOSIS — N186 End stage renal disease: Secondary | ICD-10-CM | POA: Diagnosis not present

## 2018-07-30 DIAGNOSIS — N186 End stage renal disease: Secondary | ICD-10-CM | POA: Diagnosis not present

## 2018-07-30 DIAGNOSIS — N2581 Secondary hyperparathyroidism of renal origin: Secondary | ICD-10-CM | POA: Diagnosis not present

## 2018-07-31 DIAGNOSIS — N186 End stage renal disease: Secondary | ICD-10-CM | POA: Diagnosis not present

## 2018-07-31 DIAGNOSIS — N2581 Secondary hyperparathyroidism of renal origin: Secondary | ICD-10-CM | POA: Diagnosis not present

## 2018-08-01 DIAGNOSIS — N186 End stage renal disease: Secondary | ICD-10-CM | POA: Diagnosis not present

## 2018-08-01 DIAGNOSIS — N2581 Secondary hyperparathyroidism of renal origin: Secondary | ICD-10-CM | POA: Diagnosis not present

## 2018-08-02 DIAGNOSIS — N186 End stage renal disease: Secondary | ICD-10-CM | POA: Diagnosis not present

## 2018-08-02 DIAGNOSIS — N2581 Secondary hyperparathyroidism of renal origin: Secondary | ICD-10-CM | POA: Diagnosis not present

## 2018-08-03 DIAGNOSIS — N186 End stage renal disease: Secondary | ICD-10-CM | POA: Diagnosis not present

## 2018-08-03 DIAGNOSIS — N2581 Secondary hyperparathyroidism of renal origin: Secondary | ICD-10-CM | POA: Diagnosis not present

## 2018-08-04 DIAGNOSIS — E1140 Type 2 diabetes mellitus with diabetic neuropathy, unspecified: Secondary | ICD-10-CM | POA: Diagnosis not present

## 2018-08-04 DIAGNOSIS — E11319 Type 2 diabetes mellitus with unspecified diabetic retinopathy without macular edema: Secondary | ICD-10-CM | POA: Diagnosis not present

## 2018-08-04 DIAGNOSIS — E1129 Type 2 diabetes mellitus with other diabetic kidney complication: Secondary | ICD-10-CM | POA: Diagnosis not present

## 2018-08-04 DIAGNOSIS — Z992 Dependence on renal dialysis: Secondary | ICD-10-CM | POA: Diagnosis not present

## 2018-08-04 DIAGNOSIS — N186 End stage renal disease: Secondary | ICD-10-CM | POA: Diagnosis not present

## 2018-08-04 DIAGNOSIS — N2581 Secondary hyperparathyroidism of renal origin: Secondary | ICD-10-CM | POA: Diagnosis not present

## 2018-08-05 DIAGNOSIS — N186 End stage renal disease: Secondary | ICD-10-CM | POA: Diagnosis not present

## 2018-08-05 DIAGNOSIS — N2581 Secondary hyperparathyroidism of renal origin: Secondary | ICD-10-CM | POA: Diagnosis not present

## 2018-08-06 DIAGNOSIS — N186 End stage renal disease: Secondary | ICD-10-CM | POA: Diagnosis not present

## 2018-08-06 DIAGNOSIS — N2581 Secondary hyperparathyroidism of renal origin: Secondary | ICD-10-CM | POA: Diagnosis not present

## 2018-08-07 ENCOUNTER — Encounter (HOSPITAL_COMMUNITY): Payer: Self-pay

## 2018-08-07 ENCOUNTER — Other Ambulatory Visit: Payer: Self-pay

## 2018-08-07 ENCOUNTER — Inpatient Hospital Stay (HOSPITAL_COMMUNITY)
Admission: EM | Admit: 2018-08-07 | Discharge: 2018-08-14 | DRG: 335 | Disposition: A | Discharge status: Home / Self Care | Payer: BC Managed Care – PPO | Attending: Internal Medicine | Admitting: Internal Medicine

## 2018-08-07 DIAGNOSIS — K429 Umbilical hernia without obstruction or gangrene: Secondary | ICD-10-CM | POA: Diagnosis not present

## 2018-08-07 DIAGNOSIS — K439 Ventral hernia without obstruction or gangrene: Secondary | ICD-10-CM

## 2018-08-07 DIAGNOSIS — Z4682 Encounter for fitting and adjustment of non-vascular catheter: Secondary | ICD-10-CM | POA: Diagnosis not present

## 2018-08-07 DIAGNOSIS — Z833 Family history of diabetes mellitus: Secondary | ICD-10-CM | POA: Diagnosis not present

## 2018-08-07 DIAGNOSIS — Z4659 Encounter for fitting and adjustment of other gastrointestinal appliance and device: Secondary | ICD-10-CM

## 2018-08-07 DIAGNOSIS — K430 Incisional hernia with obstruction, without gangrene: Secondary | ICD-10-CM | POA: Diagnosis not present

## 2018-08-07 DIAGNOSIS — Z20828 Contact with and (suspected) exposure to other viral communicable diseases: Secondary | ICD-10-CM | POA: Diagnosis present

## 2018-08-07 DIAGNOSIS — K460 Unspecified abdominal hernia with obstruction, without gangrene: Secondary | ICD-10-CM | POA: Diagnosis not present

## 2018-08-07 DIAGNOSIS — N186 End stage renal disease: Secondary | ICD-10-CM | POA: Diagnosis not present

## 2018-08-07 DIAGNOSIS — N2581 Secondary hyperparathyroidism of renal origin: Secondary | ICD-10-CM | POA: Diagnosis not present

## 2018-08-07 DIAGNOSIS — Z794 Long term (current) use of insulin: Secondary | ICD-10-CM | POA: Diagnosis not present

## 2018-08-07 DIAGNOSIS — Z4901 Encounter for fitting and adjustment of extracorporeal dialysis catheter: Secondary | ICD-10-CM | POA: Diagnosis not present

## 2018-08-07 DIAGNOSIS — E1122 Type 2 diabetes mellitus with diabetic chronic kidney disease: Secondary | ICD-10-CM | POA: Diagnosis not present

## 2018-08-07 DIAGNOSIS — Z992 Dependence on renal dialysis: Secondary | ICD-10-CM

## 2018-08-07 DIAGNOSIS — D631 Anemia in chronic kidney disease: Secondary | ICD-10-CM | POA: Diagnosis not present

## 2018-08-07 DIAGNOSIS — T85898A Other specified complication of other internal prosthetic devices, implants and grafts, initial encounter: Secondary | ICD-10-CM | POA: Diagnosis not present

## 2018-08-07 DIAGNOSIS — E1022 Type 1 diabetes mellitus with diabetic chronic kidney disease: Secondary | ICD-10-CM | POA: Diagnosis present

## 2018-08-07 DIAGNOSIS — E109 Type 1 diabetes mellitus without complications: Secondary | ICD-10-CM | POA: Diagnosis present

## 2018-08-07 DIAGNOSIS — E1110 Type 2 diabetes mellitus with ketoacidosis without coma: Secondary | ICD-10-CM

## 2018-08-07 DIAGNOSIS — I10 Essential (primary) hypertension: Secondary | ICD-10-CM | POA: Diagnosis present

## 2018-08-07 DIAGNOSIS — Z03818 Encounter for observation for suspected exposure to other biological agents ruled out: Secondary | ICD-10-CM | POA: Diagnosis not present

## 2018-08-07 DIAGNOSIS — I120 Hypertensive chronic kidney disease with stage 5 chronic kidney disease or end stage renal disease: Secondary | ICD-10-CM | POA: Diagnosis not present

## 2018-08-07 DIAGNOSIS — Z8249 Family history of ischemic heart disease and other diseases of the circulatory system: Secondary | ICD-10-CM | POA: Diagnosis not present

## 2018-08-07 DIAGNOSIS — K469 Unspecified abdominal hernia without obstruction or gangrene: Secondary | ICD-10-CM

## 2018-08-07 DIAGNOSIS — K56609 Unspecified intestinal obstruction, unspecified as to partial versus complete obstruction: Secondary | ICD-10-CM | POA: Diagnosis not present

## 2018-08-07 DIAGNOSIS — N19 Unspecified kidney failure: Secondary | ICD-10-CM | POA: Diagnosis not present

## 2018-08-07 DIAGNOSIS — T8089XA Other complications following infusion, transfusion and therapeutic injection, initial encounter: Secondary | ICD-10-CM | POA: Diagnosis present

## 2018-08-07 DIAGNOSIS — K5669 Other partial intestinal obstruction: Secondary | ICD-10-CM | POA: Diagnosis not present

## 2018-08-07 HISTORY — DX: End stage renal disease: N18.6

## 2018-08-07 MED ORDER — SODIUM CHLORIDE 0.9% FLUSH
3.0000 mL | Freq: Once | INTRAVENOUS | Status: AC
  Administered 2018-08-08: 3 mL via INTRAVENOUS

## 2018-08-07 NOTE — ED Triage Notes (Signed)
Pt states that he has been having abd pain since this afternoon, with vomiting, pt reports that he had a hernia pop out several days ago and tonight has got bigger and more painful, pt take peritoneal dialysis

## 2018-08-08 ENCOUNTER — Emergency Department (HOSPITAL_COMMUNITY): Payer: BC Managed Care – PPO

## 2018-08-08 ENCOUNTER — Encounter (HOSPITAL_COMMUNITY): Payer: Self-pay | Admitting: Internal Medicine

## 2018-08-08 ENCOUNTER — Inpatient Hospital Stay (HOSPITAL_COMMUNITY): Payer: BC Managed Care – PPO

## 2018-08-08 DIAGNOSIS — I10 Essential (primary) hypertension: Secondary | ICD-10-CM

## 2018-08-08 DIAGNOSIS — K430 Incisional hernia with obstruction, without gangrene: Secondary | ICD-10-CM | POA: Diagnosis present

## 2018-08-08 DIAGNOSIS — T85898A Other specified complication of other internal prosthetic devices, implants and grafts, initial encounter: Secondary | ICD-10-CM

## 2018-08-08 DIAGNOSIS — E1022 Type 1 diabetes mellitus with diabetic chronic kidney disease: Secondary | ICD-10-CM

## 2018-08-08 DIAGNOSIS — K429 Umbilical hernia without obstruction or gangrene: Secondary | ICD-10-CM | POA: Diagnosis present

## 2018-08-08 DIAGNOSIS — Z992 Dependence on renal dialysis: Secondary | ICD-10-CM

## 2018-08-08 DIAGNOSIS — N2581 Secondary hyperparathyroidism of renal origin: Secondary | ICD-10-CM | POA: Diagnosis not present

## 2018-08-08 DIAGNOSIS — K469 Unspecified abdominal hernia without obstruction or gangrene: Secondary | ICD-10-CM | POA: Diagnosis present

## 2018-08-08 DIAGNOSIS — K56609 Unspecified intestinal obstruction, unspecified as to partial versus complete obstruction: Secondary | ICD-10-CM | POA: Diagnosis present

## 2018-08-08 DIAGNOSIS — Z20828 Contact with and (suspected) exposure to other viral communicable diseases: Secondary | ICD-10-CM | POA: Diagnosis present

## 2018-08-08 DIAGNOSIS — Z8249 Family history of ischemic heart disease and other diseases of the circulatory system: Secondary | ICD-10-CM | POA: Diagnosis not present

## 2018-08-08 DIAGNOSIS — Z833 Family history of diabetes mellitus: Secondary | ICD-10-CM | POA: Diagnosis not present

## 2018-08-08 DIAGNOSIS — T8089XA Other complications following infusion, transfusion and therapeutic injection, initial encounter: Secondary | ICD-10-CM | POA: Diagnosis present

## 2018-08-08 DIAGNOSIS — Z4659 Encounter for fitting and adjustment of other gastrointestinal appliance and device: Secondary | ICD-10-CM

## 2018-08-08 DIAGNOSIS — Z794 Long term (current) use of insulin: Secondary | ICD-10-CM | POA: Diagnosis not present

## 2018-08-08 DIAGNOSIS — K439 Ventral hernia without obstruction or gangrene: Secondary | ICD-10-CM | POA: Diagnosis not present

## 2018-08-08 DIAGNOSIS — D631 Anemia in chronic kidney disease: Secondary | ICD-10-CM | POA: Diagnosis present

## 2018-08-08 DIAGNOSIS — N186 End stage renal disease: Secondary | ICD-10-CM | POA: Diagnosis not present

## 2018-08-08 HISTORY — PX: IR US GUIDE VASC ACCESS RIGHT: IMG2390

## 2018-08-08 HISTORY — DX: Essential (primary) hypertension: I10

## 2018-08-08 HISTORY — PX: IR FLUORO GUIDE CV LINE RIGHT: IMG2283

## 2018-08-08 LAB — URINALYSIS, ROUTINE W REFLEX MICROSCOPIC
Bacteria, UA: NONE SEEN
Bilirubin Urine: NEGATIVE
Glucose, UA: >=500 mg/dL — AB
Ketones, ur: NEGATIVE mg/dL
Leukocytes,Ua: NEGATIVE
Nitrite: NEGATIVE
Protein, ur: 100 mg/dL — AB
Specific Gravity, Urine: 1.01 (ref 1.005–1.030)
pH: 6 (ref 5.0–8.0)

## 2018-08-08 LAB — COMPREHENSIVE METABOLIC PANEL
ALT: 23 U/L (ref 0–44)
AST: 17 U/L (ref 15–41)
Albumin: 3.2 g/dL — ABNORMAL LOW (ref 3.5–5.0)
Alkaline Phosphatase: 66 U/L (ref 38–126)
Anion gap: 13 (ref 5–15)
BUN: 75 mg/dL — ABNORMAL HIGH (ref 6–20)
CO2: 27 mmol/L (ref 22–32)
Calcium: 9.4 mg/dL (ref 8.9–10.3)
Chloride: 96 mmol/L — ABNORMAL LOW (ref 98–111)
Creatinine, Ser: 10.16 mg/dL — ABNORMAL HIGH (ref 0.61–1.24)
GFR calc Af Amer: 6 mL/min — ABNORMAL LOW (ref >60)
GFR calc non Af Amer: 5 mL/min — ABNORMAL LOW (ref >60)
Glucose, Bld: 263 mg/dL — ABNORMAL HIGH (ref 70–99)
Potassium: 3.6 mmol/L (ref 3.5–5.1)
Sodium: 136 mmol/L (ref 135–145)
Total Bilirubin: 0.6 mg/dL (ref 0.3–1.2)
Total Protein: 5.6 g/dL — ABNORMAL LOW (ref 6.5–8.1)

## 2018-08-08 LAB — CBG MONITORING, ED: Glucose-Capillary: 258 mg/dL — ABNORMAL HIGH (ref 70–99)

## 2018-08-08 LAB — CBC
HCT: 28.5 % — ABNORMAL LOW (ref 39.0–52.0)
Hemoglobin: 9.8 g/dL — ABNORMAL LOW (ref 13.0–17.0)
MCH: 30.8 pg (ref 26.0–34.0)
MCHC: 34.4 g/dL (ref 30.0–36.0)
MCV: 89.6 fL (ref 80.0–100.0)
Platelets: 194 10*3/uL (ref 150–400)
RBC: 3.18 MIL/uL — ABNORMAL LOW (ref 4.22–5.81)
RDW: 12.1 % (ref 11.5–15.5)
WBC: 9.1 10*3/uL (ref 4.0–10.5)
nRBC: 0 % (ref 0.0–0.2)

## 2018-08-08 LAB — LIPASE, BLOOD: Lipase: 27 U/L (ref 11–51)

## 2018-08-08 LAB — SARS CORONAVIRUS 2 BY RT PCR (HOSPITAL ORDER, PERFORMED IN ~~LOC~~ HOSPITAL LAB): SARS Coronavirus 2: NEGATIVE

## 2018-08-08 LAB — HEMOGLOBIN A1C
Hgb A1c MFr Bld: 9.2 % — ABNORMAL HIGH (ref 4.8–5.6)
Mean Plasma Glucose: 217.34 mg/dL

## 2018-08-08 LAB — PROTIME-INR
INR: 1 (ref 0.8–1.2)
Prothrombin Time: 12.7 seconds (ref 11.4–15.2)

## 2018-08-08 LAB — GLUCOSE, CAPILLARY
Glucose-Capillary: 218 mg/dL — ABNORMAL HIGH (ref 70–99)
Glucose-Capillary: 268 mg/dL — ABNORMAL HIGH (ref 70–99)
Glucose-Capillary: 192 mg/dL — ABNORMAL HIGH (ref 70–99)

## 2018-08-08 LAB — LACTIC ACID, PLASMA: Lactic Acid, Venous: 1.5 mmol/L (ref 0.5–1.9)

## 2018-08-08 LAB — HIV ANTIBODY (ROUTINE TESTING W REFLEX): HIV Screen 4th Generation wRfx: NONREACTIVE

## 2018-08-08 MED ORDER — MIDAZOLAM HCL 2 MG/2ML IJ SOLN
INTRAMUSCULAR | Status: AC
  Filled 2018-08-08: qty 2

## 2018-08-08 MED ORDER — GELATIN ABSORBABLE 12-7 MM EX MISC
CUTANEOUS | Status: AC
  Administered 2018-08-08: 17:00:00
  Filled 2018-08-08: qty 1

## 2018-08-08 MED ORDER — MORPHINE SULFATE (PF) 2 MG/ML IV SOLN: 2.0000 mg | INTRAVENOUS | Status: DC | PRN

## 2018-08-08 MED ORDER — ACETAMINOPHEN 650 MG RE SUPP: 650.0000 mg | Freq: Four times a day (QID) | RECTAL | Status: DC | PRN

## 2018-08-08 MED ORDER — ACETAMINOPHEN 325 MG PO TABS: 650.0000 mg | ORAL_TABLET | Freq: Four times a day (QID) | ORAL | Status: DC | PRN

## 2018-08-08 MED ORDER — LIDOCAINE HCL 1 % IJ SOLN
INTRAMUSCULAR | Status: AC | PRN
  Administered 2018-08-08: 10 mL

## 2018-08-08 MED ORDER — INSULIN ASPART 100 UNIT/ML ~~LOC~~ SOLN
0.0000 [IU] | SUBCUTANEOUS | Status: DC
  Administered 2018-08-08: 5 [IU] via SUBCUTANEOUS

## 2018-08-08 MED ORDER — CEFAZOLIN SODIUM-DEXTROSE 2-4 GM/100ML-% IV SOLN
INTRAVENOUS | Status: AC
  Filled 2018-08-08: qty 100

## 2018-08-08 MED ORDER — FENTANYL CITRATE (PF) 100 MCG/2ML IJ SOLN
INTRAMUSCULAR | Status: AC | PRN
  Administered 2018-08-08 (×2): 50 ug via INTRAVENOUS

## 2018-08-08 MED ORDER — HYDRALAZINE HCL 20 MG/ML IJ SOLN: 10.0000 mg | INTRAMUSCULAR | Status: DC | PRN

## 2018-08-08 MED ORDER — INSULIN GLARGINE 100 UNIT/ML ~~LOC~~ SOLN
5.0000 [IU] | Freq: Every day | SUBCUTANEOUS | Status: DC
  Administered 2018-08-08 – 2018-08-11 (×4): 5 [IU] via SUBCUTANEOUS
  Filled 2018-08-08 (×6): qty 0.05

## 2018-08-08 MED ORDER — HEPARIN SODIUM (PORCINE) 1000 UNIT/ML IJ SOLN
INTRAMUSCULAR | Status: AC
  Administered 2018-08-08: 3.2 mL
  Filled 2018-08-08: qty 1

## 2018-08-08 MED ORDER — ONDANSETRON HCL 4 MG/2ML IJ SOLN: 4.0000 mg | Freq: Four times a day (QID) | INTRAMUSCULAR | Status: DC | PRN

## 2018-08-08 MED ORDER — CHLORHEXIDINE GLUCONATE CLOTH 2 % EX PADS
6.0000 | MEDICATED_PAD | Freq: Every day | CUTANEOUS | Status: DC
  Administered 2018-08-11 – 2018-08-14 (×3): 6 via TOPICAL

## 2018-08-08 MED ORDER — SODIUM CHLORIDE 0.9 % IV SOLN
INTRAVENOUS | Status: DC
  Administered 2018-08-08 – 2018-08-11 (×6): via INTRAVENOUS

## 2018-08-08 MED ORDER — METOPROLOL SUCCINATE ER 25 MG PO TB24: 25.0000 mg | ORAL_TABLET | Freq: Every day | ORAL | Status: DC

## 2018-08-08 MED ORDER — INSULIN GLARGINE 100 UNIT/ML ~~LOC~~ SOLN
10.0000 [IU] | Freq: Every day | SUBCUTANEOUS | Status: DC
  Filled 2018-08-08: qty 0.1

## 2018-08-08 MED ORDER — LIDOCAINE HCL 1 % IJ SOLN
INTRAMUSCULAR | Status: AC
  Filled 2018-08-08: qty 20

## 2018-08-08 MED ORDER — AMLODIPINE BESYLATE 10 MG PO TABS: 10.0000 mg | ORAL_TABLET | Freq: Every day | ORAL | Status: DC

## 2018-08-08 MED ORDER — MIDAZOLAM HCL 2 MG/2ML IJ SOLN
INTRAMUSCULAR | Status: AC | PRN
  Administered 2018-08-08 (×2): 1 mg via INTRAVENOUS

## 2018-08-08 MED ORDER — FENTANYL CITRATE (PF) 100 MCG/2ML IJ SOLN
INTRAMUSCULAR | Status: AC
  Filled 2018-08-08: qty 2

## 2018-08-08 MED ORDER — CEFAZOLIN SODIUM-DEXTROSE 2-4 GM/100ML-% IV SOLN
2.0000 g | Freq: Once | INTRAVENOUS | Status: AC
  Administered 2018-08-08: 2 g via INTRAVENOUS

## 2018-08-08 MED ORDER — ONDANSETRON HCL 4 MG PO TABS: 4.0000 mg | ORAL_TABLET | Freq: Four times a day (QID) | ORAL | Status: DC | PRN

## 2018-08-08 MED ORDER — INSULIN ASPART 100 UNIT/ML ~~LOC~~ SOLN
0.0000 [IU] | SUBCUTANEOUS | Status: DC
  Administered 2018-08-08: 8 [IU] via SUBCUTANEOUS
  Administered 2018-08-08: 3 [IU] via SUBCUTANEOUS
  Administered 2018-08-09 (×2): 2 [IU] via SUBCUTANEOUS
  Administered 2018-08-09 – 2018-08-11 (×2): 3 [IU] via SUBCUTANEOUS
  Administered 2018-08-11 – 2018-08-12 (×2): 2 [IU] via SUBCUTANEOUS
  Administered 2018-08-12 – 2018-08-13 (×2): 5 [IU] via SUBCUTANEOUS
  Administered 2018-08-13 – 2018-08-14 (×3): 3 [IU] via SUBCUTANEOUS

## 2018-08-08 MED ORDER — FENTANYL CITRATE (PF) 100 MCG/2ML IJ SOLN
50.0000 ug | Freq: Once | INTRAMUSCULAR | Status: AC | PRN
  Administered 2018-08-08: 50 ug via INTRAVENOUS
  Filled 2018-08-08: qty 2

## 2018-08-08 MED ORDER — METOPROLOL TARTRATE 5 MG/5ML IV SOLN
2.5000 mg | Freq: Four times a day (QID) | INTRAVENOUS | Status: DC
  Administered 2018-08-08 – 2018-08-14 (×20): 2.5 mg via INTRAVENOUS
  Filled 2018-08-08 (×22): qty 5

## 2018-08-08 MED ORDER — METOCLOPRAMIDE HCL 5 MG/ML IJ SOLN
10.0000 mg | INTRAMUSCULAR | Status: AC
  Administered 2018-08-08: 10 mg via INTRAVENOUS
  Filled 2018-08-08: qty 2

## 2018-08-08 NOTE — H&P (Signed)
History and Physical    Alex Morrison WRU:[id number] DOB: 1970/06/20 DOA: 08/07/2018  PCP: Haywood Pao, MD  Patient coming from: Home  I have personally briefly reviewed patient's old medical records in [place]  Chief Complaint: Abd pain, hernia  HPI: Alex Morrison is a 48 y.o. male with medical history significant of DM1, HTN, ESRD on PD started 12/2017.  Patient presents to the ED for evaluation of abd pain.  Onset yesterday, this evening associated with nausea and severe / forceful vomiting.  Pain has subsided but vomiting persists.  Patient has large bulge (hernia) in abdomen.  Has small hernia at baseline but reports that this became much larger since onset of symptoms.  Called his dialysis center who suggested that there might be some issue with his catheter and sent him in to the ED.  Was unable to do PD last night due to these issues though he usually does this nightly.  Also does have h/o ExLap with ileocecectomy in the setting of perforated appendix and peritonitis.    ED Course: High grade SBO secondary to large periumbilical / incisional hernia.  The PD catheter crosses the hernia sac and this may also limit reduction according to CT.  Gen surg consulted and hospitalist asked to admit.   Review of Systems: As per HPI, otherwise all review of systems negative.  Past Medical History:  Diagnosis Date  . Abscess of buttock   . Acute kidney injury ([place]) 09/27/2013  . Diabetes mellitus without complication ([place])   . HTN (hypertension) 08/08/2018  . Perforated appendix    Peritonitis    Past Surgical History:  Procedure Laterality Date  . Ex lap with ileocecectomy  09/27/13   Dr. Brantley Stage  . INCISION AND DRAINAGE    . LAPAROSCOPIC ABDOMINAL EXPLORATION N/A 09/27/2013   Procedure: LAPAROSCOPIC ABDOMINAL EXPLORATION: INTERNAL ILIEUM, CECUM, RIGHT COLON;  Surgeon: Erroll Luna, MD;  Location: [facility];  Service: General;  Laterality: N/A;  . LAPAROSCOPY  N/A 09/27/2013   Procedure: LAPAROSCOPY DIAGNOSTIC;  Surgeon: Erroll Luna, MD;  Location: [facility];  Service: General;  Laterality: N/A;  . [place]     reports that he has never smoked. He does not have any smokeless tobacco history on file. He reports that he does not drink alcohol or use drugs.  No Known Allergies  Family History  Problem Relation Age of Onset  . Diabetes Mellitus II Mother   . Diabetes Mother   . Heart disease Mother   . Diabetes Mellitus I Father   . Diabetes Father   . Diabetes Mellitus II Brother   . Diabetes Brother      Prior to Admission medications   Medication Sig Start Date End Date Taking? Authorizing Provider  amLODipine (NORVASC) 10 MG tablet Take 10 mg by mouth daily. 08/05/18  Yes [provider]  cloNIDine (CATAPRES) 0.1 MG tablet Take 1 tablet by mouth 3 (three) times daily as needed. For BP > 160/90 ( atleast 6 hours apart) 09/14/14  Yes [provider]  furosemide (LASIX) 80 MG tablet Take 80 mg by mouth 2 (two) times a day. 08/01/18  Yes [provider]  insulin aspart (NOVOLOG FLEXPEN) 100 UNIT/ML FlexPen Inject 2-10 Units into the skin 3 (three) times daily. Per sliding scale: 140-199=2 units 200-250=4 units 251-299=6 units 300-349=8 units 350> =10 units   Yes [provider]  Insulin Glargine (LANTUS SOLOSTAR) 100 UNIT/ML Solostar Pen Inject 10 Units into the skin  at bedtime.    Yes [provider]  irbesartan (AVAPRO) 300 MG tablet Take 300 mg by mouth daily. 08/23/14  Yes [provider]  metoprolol succinate (TOPROL-XL) 25 MG 24 hr tablet Take 25 mg by mouth daily. 10/06/14  Yes [provider]  sevelamer carbonate (RENVELA) 800 MG tablet Take 2,400 mg by mouth 3 (three) times daily with meals. 08/05/18  Yes [provider]    Physical Exam: Vitals:   08/08/18 0445 08/08/18 0500 08/08/18 0515 08/08/18 0545  BP: 129/75 139/74 137/75 138/79  Pulse: 91 94  90 98  Resp:      Temp:      TempSrc:      SpO2: 100% 100% 98% 98%    Constitutional: NAD, calm, comfortable Eyes: PERRL, lids and conjunctivae normal ENMT: Mucous membranes are moist. Posterior pharynx clear of any exudate or lesions.Normal dentition.  Neck: normal, supple, no masses, no thyromegaly Respiratory: clear to auscultation bilaterally, no wheezing, no crackles. Normal respiratory effort. No accessory muscle use.  Cardiovascular: Regular rate and rhythm, no murmurs / rubs / gallops. No extremity edema. 2+ pedal pulses. No carotid bruits.  Abdomen: Large periumbilical hernia Musculoskeletal: no clubbing / cyanosis. No joint deformity upper and lower extremities. Good ROM, no contractures. Normal muscle tone.  Skin: no rashes, lesions, ulcers. No induration Neurologic: CN 2-12 grossly intact. Sensation intact, DTR normal. Strength 5/5 in all 4.  Psychiatric: Normal judgment and insight. Alert and oriented x 3. Normal mood.    Labs on Admission: I have personally reviewed following labs and imaging studies  CBC: Recent Labs  Lab 08/07/18 2336  WBC 9.1  HGB 9.8*  HCT 28.5*  MCV 89.6  PLT 315   Basic Metabolic Panel: Recent Labs  Lab 08/07/18 2336  NA 136  K 3.6  CL 96*  CO2 27  GLUCOSE 263*  BUN 75*  CREATININE 10.16*  CALCIUM 9.4   GFR: CrCl cannot be calculated (Unknown ideal weight.). Liver Function Tests: Recent Labs  Lab 08/07/18 2336  AST 17  ALT 23  ALKPHOS 66  BILITOT 0.6  PROT 5.6*  ALBUMIN 3.2*   Recent Labs  Lab 08/07/18 2336  LIPASE 27   No results for input(s): AMMONIA in the last 168 hours. Coagulation Profile: No results for input(s): INR, PROTIME in the last 168 hours. Cardiac Enzymes: No results for input(s): CKTOTAL, CKMB, CKMBINDEX, TROPONINI in the last 168 hours. BNP (last 3 results) No results for input(s): PROBNP in the last 8760 hours. HbA1C: No results for input(s): HGBA1C in the last 72 hours. CBG: Recent Labs   Lab 08/08/18 0543  GLUCAP 258*   Lipid Profile: No results for input(s): CHOL, HDL, LDLCALC, TRIG, CHOLHDL, LDLDIRECT in the last 72 hours. Thyroid Function Tests: No results for input(s): TSH, T4TOTAL, FREET4, T3FREE, THYROIDAB in the last 72 hours. Anemia Panel: No results for input(s): VITAMINB12, FOLATE, FERRITIN, TIBC, IRON, RETICCTPCT in the last 72 hours. Urine analysis:    Component Value Date/Time   COLORURINE STRAW (A) 08/07/2018 2328   APPEARANCEUR CLEAR 08/07/2018 2328   LABSPEC 1.010 08/07/2018 2328   PHURINE 6.0 08/07/2018 2328   GLUCOSEU >=500 (A) 08/07/2018 2328   HGBUR SMALL (A) 08/07/2018 2328   BILIRUBINUR NEGATIVE 08/07/2018 2328   KETONESUR NEGATIVE 08/07/2018 2328   PROTEINUR 100 (A) 08/07/2018 2328   UROBILINOGEN 0.2 09/28/2013 1034   NITRITE NEGATIVE 08/07/2018 2328   LEUKOCYTESUR NEGATIVE 08/07/2018 2328    Radiological Exams on Admission: Ct Abdomen Pelvis  Wo Contrast  Result Date: 08/08/2018 CLINICAL DATA:  Nausea and vomiting with abdominal swelling. EXAM: CT ABDOMEN AND PELVIS WITHOUT CONTRAST TECHNIQUE: Multidetector CT imaging of the abdomen and pelvis was performed following the standard protocol without IV contrast. COMPARISON:  09/27/2013 FINDINGS: Lower chest:  No contributory findings. Hepatobiliary: No focal liver abnormality.No evidence of biliary obstruction or stone. Pancreas: Unremarkable. Spleen: Unremarkable. Adrenals/Urinary Tract: Negative adrenals. No hydronephrosis or stone. Unremarkable bladder. Stomach/Bowel: High-grade small bowel obstruction with dilated fluid-filled loops and mesenteric edema. Bowel loops proximal and distal to the obstructed segments are collapsed and this may reflect a closed loop obstruction. A transition is seen at the level of a large periumbilical hernia which contains distended loops and ascitic fluid. Note that the patient's peritoneal catheter traverses this hernia sac. Bowel sutures in the right upper  quadrant. There is history of perforated appendix surgery. Vascular/Lymphatic: Diffuse arterial calcification. No mass or adenopathy. Reproductive:Negative Other: Small volume ascites/dialysate. Tenckhoff catheter tip is in the anterior left lower quadrant Musculoskeletal: No acute finding IMPRESSION: High-grade small bowel obstruction with a transition point seen at a large periumbilical hernia. The patient's Tenckhoff catheter traverses the hernia sac, which may limit reduction. Proximal small bowel loops are also collapsed, there may be a closed loop obstruction. Electronically Signed   By: Monte Fantasia M.D.   On: 08/08/2018 05:01    EKG: Independently reviewed.  Assessment/Plan Principal Problem:   Abdominal hernia as complication of peritoneal dialysis Active Problems:   Type 1 diabetes mellitus (HCC)   ESRD (end stage renal disease) (HCC)   HTN (hypertension)   SBO (small bowel obstruction) (HCC)   Periumbilical hernia    1. Incarcerated periumbilical / incisional hernia with high grade SBO - 1. NPO 2. IVF: NS at 50cc/hr for now 3. Strict intake and output 4. Dr. Grandville Silos coming to see 5. Zofran PRN 6. Morphine PRN 2. ESRD - with PD catheter crossing hernia 1. Surg eval still pending 2. However, doubt that PD will be option in the immediate future in this patient (either due to SBO / hernia with involvement of catheter, or possibly because he will be post-op from surgery to reduce hernia). 3. Therefore will put in IR consult to evaluate for likely need for HD catheter placement as alternative 4. Call nephrology in AM 3. DM1 - 1. Continue home Lantus 10u QHS 2. Sensitive SSI Q4H 4. HTN - 1. Continue Amlodipine and Metoprolol 2. Hold Lasix and irbesartan 3. Add PRN if needed  DVT prophylaxis: SCDs Code Status: Full Family Communication: No family in room Disposition Plan: Home after admit Consults called: Dr. Grandville Silos Admission status: Admit to inpatient  Severity of  Illness: The appropriate patient status for this patient is INPATIENT. Inpatient status is judged to be reasonable and necessary in order to provide the required intensity of service to ensure the patient's safety. The patient's presenting symptoms, physical exam findings, and initial radiographic and laboratory data in the context of their chronic comorbidities is felt to place them at high risk for further clinical deterioration. Furthermore, it is not anticipated that the patient will be medically stable for discharge from the hospital within 2 midnights of admission. The following factors support the patient status of inpatient.   IP status for treatment of high grade SBO with NGT, incarcerated hernia.   * I certify that at the point of admission it is my clinical judgment that the patient will require inpatient hospital care spanning beyond 2 midnights from the point of  admission due to high intensity of service, high risk for further deterioration and high frequency of surveillance required.*    ,  M. DO [facility]  How to contact the [facility] Attending or Consulting provider [place] or covering provider during after hours [place], for this patient?  1. Check the care team in [facility] and look for a) attending/consulting [facility] provider listed and b) the [facility] team listed 2. Log into [email or web address]  Amion Physician Scheduling and messaging for groups and whole hospitals  On call and physician scheduling software for group practices, residents, hospitalists and other medical providers for call, clinic, rotation and shift schedules. On[employer] is a hospital-wide system for scheduling doctors and paging doctors on call. EasyPlot is for scientific plotting and data analysis.  [email or web address]  and use [place]'s universal password to access. If you do not have the password, please contact the hospital operator.  3. Locate the [facility] provider you are looking for under [facility]  and page to a number that you can be directly reached. 4. If you still have difficulty reaching the provider, please page the [facility] (Director on Call) for the Hospitalists listed on amion for assistance.  08/08/2018, 6:04 AM

## 2018-08-08 NOTE — ED Notes (Signed)
ED TO INPATIENT HANDOFF REPORT  ED Nurse Name and Phone #: [phone] Threasa Beards, RN  S Name/Age/Gender Alex Morrison [address] 48 y.o. male Room/Bed: 023C/023C  Code Status   Code Status: Full Code  Home/SNF/Other Home Patient oriented to: self, place, time and situation Is this baseline? Yes   Triage Complete: Triage complete  Chief Complaint abdominal pain and lump on abdomen (kidney patient)  Triage Note Pt states that he has been having abd pain since this afternoon, with vomiting, pt reports that he had a hernia pop out several days ago and tonight has got bigger and more painful, pt take peritoneal dialysis     Allergies No Known Allergies  Level of Care/Admitting Diagnosis ED Disposition    ED Disposition Condition [place]: [place] [100100]  Level of Care: Med-Surg [16]  Covid Evaluation: Asymptomatic Screening Protocol (No Symptoms)  Diagnosis: SBO (small bowel obstruction) [facility]) [408144]  Admitting Physician: Doreatha Massed  Attending Physician: Etta Quill [phone]  Estimated length of stay: past midnight tomorrow  Certification:: I certify this patient will need inpatient services for at least 2 midnights  PT Class (Do Not Modify): Inpatient [101]  PT Acc Code (Do Not Modify): Private [1]       B Medical/Surgery History Past Medical History:  Diagnosis Date  . Abscess of buttock   . Acute kidney injury ([place]) 09/27/2013  . Diabetes mellitus without complication ([place])   . HTN (hypertension) 08/08/2018  . Perforated appendix    Peritonitis   Past Surgical History:  Procedure Laterality Date  . Ex lap with ileocecectomy  09/27/13   Dr. Brantley Stage  . INCISION AND DRAINAGE    . LAPAROSCOPIC ABDOMINAL EXPLORATION N/A 09/27/2013   Procedure: LAPAROSCOPIC ABDOMINAL EXPLORATION: INTERNAL ILIEUM, CECUM, RIGHT COLON;  Surgeon: Erroll Luna, MD;  Location: [place];  Service: General;  Laterality: N/A;  . LAPAROSCOPY N/A  09/27/2013   Procedure: LAPAROSCOPY DIAGNOSTIC;  Surgeon: Erroll Luna, MD;  Location: [place];  Service: General;  Laterality: N/A;  . [place]     A IV Location/Drains/Wounds Patient Lines/Drains/Airways Status   Active Line/Drains/Airways    Name:   Placement date:   Placement time:   Site:   Days:   Peripheral IV 08/08/18 Left Forearm   08/08/18    0520    Forearm   less than 1   NG/OG Tube Nasogastric 14 Fr. Right nare Aucultation Measured external length of tube   08/08/18    0530    Right nare   less than 1   Incision (Closed) 09/27/13 Abdomen Other (Comment)   09/27/13    1625     1776   Wound 07/16/12 Other (Comment) Buttocks Left I and D done, packing in place   07/16/12    -    Buttocks   2214          Intake/Output Last 24 hours No intake or output data in the 24 hours ending 08/08/18 0555  Labs/Imaging Results for orders placed or performed during the hospital encounter of 08/07/18 (from the past 48 hour(s))  Urinalysis, Routine w reflex microscopic     Status: Abnormal   Collection Time: 08/07/18 11:28 PM  Result Value Ref Range   Color, Urine STRAW (A) YELLOW   APPearance CLEAR CLEAR   Specific Gravity, Urine 1.010 1.005 - 1.030   pH 6.0 5.0 - 8.0   Glucose, UA >=500 (A) NEGATIVE mg/dL  Hgb urine dipstick SMALL (A) NEGATIVE   Bilirubin Urine NEGATIVE NEGATIVE   Ketones, ur NEGATIVE NEGATIVE mg/dL   Protein, ur 100 (A) NEGATIVE mg/dL   Nitrite NEGATIVE NEGATIVE   Leukocytes,Ua NEGATIVE NEGATIVE   RBC / HPF 0-5 0 - 5 RBC/hpf   WBC, UA 0-5 0 - 5 WBC/hpf   Bacteria, UA NONE SEEN NONE SEEN    Comment: Performed at [facility] [address] [place], [place] [address]  Lipase, blood     Status: None   Collection Time: 08/07/18 11:36 PM  Result Value Ref Range   Lipase 27 11 - 51 U/L    Comment: Performed at [facility], [place] [address] [place], [place] [address]  Comprehensive metabolic panel     Status: Abnormal   Collection  Time: 08/07/18 11:36 PM  Result Value Ref Range   Sodium 136 135 - 145 mmol/L   Potassium 3.6 3.5 - 5.1 mmol/L   Chloride 96 (L) 98 - 111 mmol/L   CO2 27 22 - 32 mmol/L   Glucose, Bld 263 (H) 70 - 99 mg/dL   BUN 75 (H) 6 - 20 mg/dL   Creatinine, Ser 10.16 (H) 0.61 - 1.24 mg/dL   Calcium 9.4 8.9 - 10.3 mg/dL   Total Protein 5.6 (L) 6.5 - 8.1 g/dL   Albumin 3.2 (L) 3.5 - 5.0 g/dL   AST 17 15 - 41 U/L   ALT 23 0 - 44 U/L   Alkaline Phosphatase 66 38 - 126 U/L   Total Bilirubin 0.6 0.3 - 1.2 mg/dL   GFR calc non Af Amer 5 (L) >60 mL/min   GFR calc Af Amer 6 (L) >60 mL/min   Anion gap 13 5 - 15    Comment: Performed at [facility], [place] [address], [place], [place] [address]  CBC     Status: Abnormal   Collection Time: 08/07/18 11:36 PM  Result Value Ref Range   WBC 9.1 4.0 - 10.5 K/uL   RBC 3.18 (L) 4.22 - 5.81 MIL/uL   Hemoglobin 9.8 (L) 13.0 - 17.0 g/dL   HCT 28.5 (L) 39.0 - 52.0 %   MCV 89.6 80.0 - 100.0 fL   MCH 30.8 26.0 - 34.0 pg   MCHC 34.4 30.0 - 36.0 g/dL   RDW 12.1 11.5 - 15.5 %   Platelets 194 150 - 400 K/uL   nRBC 0.0 0.0 - 0.2 %    Comment: Performed at [facility], [place] [address] [place], [place] [address]  Lactic acid, plasma     Status: None   Collection Time: 08/08/18  2:19 AM  Result Value Ref Range   Lactic Acid, Venous 1.5 0.5 - 1.9 mmol/L    Comment: Performed at [facility] [address], [place], [place] [address]  CBG monitoring, ED     Status: Abnormal   Collection Time: 08/08/18  5:43 AM  Result Value Ref Range   Glucose-Capillary 258 (H) 70 - 99 mg/dL   Ct Abdomen Pelvis Wo Contrast  Result Date: 08/08/2018 CLINICAL DATA:  Nausea and vomiting with abdominal swelling. EXAM: CT ABDOMEN AND PELVIS WITHOUT CONTRAST TECHNIQUE: Multidetector CT imaging of the abdomen and pelvis was performed following the standard protocol without IV contrast. COMPARISON:  09/27/2013 FINDINGS: Lower chest:  No contributory findings. Hepatobiliary:  No focal liver abnormality.No evidence of biliary obstruction or stone. Pancreas: Unremarkable. Spleen: Unremarkable. Adrenals/Urinary Tract: Negative adrenals. No hydronephrosis or stone.  Unremarkable bladder. Stomach/Bowel: High-grade small bowel obstruction with dilated fluid-filled loops and mesenteric edema. Bowel loops proximal and distal to the obstructed segments are collapsed and this may reflect a closed loop obstruction. A transition is seen at the level of a large periumbilical hernia which contains distended loops and ascitic fluid. Note that the patient's peritoneal catheter traverses this hernia sac. Bowel sutures in the right upper quadrant. There is history of perforated appendix surgery. Vascular/Lymphatic: Diffuse arterial calcification. No mass or adenopathy. Reproductive:Negative Other: Small volume ascites/dialysate. Tenckhoff catheter tip is in the anterior left lower quadrant Musculoskeletal: No acute finding IMPRESSION: High-grade small bowel obstruction with a transition point seen at a large periumbilical hernia. The patient's Tenckhoff catheter traverses the hernia sac, which may limit reduction. Proximal small bowel loops are also collapsed, there may be a closed loop obstruction. Electronically Signed   By: Monte Fantasia M.D.   On: 08/08/2018 05:01    Pending Labs Unresulted Labs (From admission, onward)    Start     Ordered   08/09/18 0500  CBC  Tomorrow morning,   R     08/08/18 0542   08/09/18 8016  Basic metabolic panel  Tomorrow morning,   R     08/08/18 0542   08/08/18 0538  HIV antibody (Routine Testing)  Once,   STAT     08/08/18 0542   08/08/18 0529  Hemoglobin A1c  Once,   STAT    Comments: To assess prior glycemic control    08/08/18 0529   08/08/18 0512  SARS Coronavirus 2 (CEPHEID - Performed in [facility] hospital lab), Hosp Order  (Asymptomatic Patients Labs)  Once,   STAT    Question:  Rule Out  Answer:  Yes   08/08/18 0512           Vitals/Pain Today's Vitals   08/08/18 0315 08/08/18 0330 08/08/18 0445 08/08/18 0500  BP: 124/72 131/71 129/75 139/74  Pulse: 94 94 91 94  Resp:      Temp:      TempSrc:      SpO2: 99% 98% 100% 100%  PainSc:        Isolation Precautions No active isolations  Medications Medications  insulin glargine (LANTUS) injection 10 Units (has no administration in time range)  insulin aspart (novoLOG) injection 0-9 Units (5 Units Subcutaneous Given 08/08/18 0550)  amLODipine (NORVASC) tablet 10 mg (has no administration in time range)  metoprolol succinate (TOPROL-XL) 24 hr tablet 25 mg (has no administration in time range)  acetaminophen (TYLENOL) tablet 650 mg (has no administration in time range)    Or  acetaminophen (TYLENOL) suppository 650 mg (has no administration in time range)  ondansetron (ZOFRAN) tablet 4 mg (has no administration in time range)    Or  ondansetron (ZOFRAN) injection 4 mg (has no administration in time range)  sodium chloride flush (NS) 0.9 % injection 3 mL (3 mLs Intravenous Given 08/08/18 0543)  fentaNYL (SUBLIMAZE) injection 50 mcg (50 mcg Intravenous Given 08/08/18 0525)  metoCLOPramide (REGLAN) injection 10 mg (10 mg Intravenous Given 08/08/18 0541)    Mobility walks Low fall risk   Focused Assessments GI   R Recommendations: See Admitting Provider Note  Report given to:   Additional Notes:

## 2018-08-08 NOTE — ED Notes (Signed)
Report called to floor.  Will place NG again prior to transport.  Pt has removed the tube as well as BP cuff, etc.   Appears calm and appropriate without pain responses.

## 2018-08-08 NOTE — Sedation Documentation (Signed)
Pt is resting well, Vitals are stable at this time. Procedure started

## 2018-08-08 NOTE — Sedation Documentation (Signed)
Patient is resting comfortably. 

## 2018-08-08 NOTE — Progress Notes (Signed)
Received from ER via [place].  NG patent.  Physical assessment done.  Abd soft but a large scar noted from a previous surgery in the past noted.  Bruising noted to the abd and the patient reports that it is from his insulin injections.  The patient is alert and answers all questions appropriately,  Denies having any skin issues.  The patient was connected to low intermittent suction and large amount of greenish tan liquid noted in canister.

## 2018-08-08 NOTE — Progress Notes (Signed)
Per HPI: Alex Morrison is a 48 y.o. male with medical history significant of DM1, HTN, ESRD on PD started 12/2017.  Patient presents to the ED for evaluation of abd pain.  Onset yesterday, this evening associated with nausea and severe / forceful vomiting.  Pain has subsided but vomiting persists.  Patient has large bulge (hernia) in abdomen.  Has small hernia at baseline but reports that this became much larger since onset of symptoms.  Called his dialysis center who suggested that there might be some issue with his catheter and sent him in to the ED.  Was unable to do PD last night due to these issues though he usually does this nightly.  Also does have h/o ExLap with ileocecectomy in the setting of perforated appendix and peritonitis.   Patient was seen and evaluated at bedside with no acute complaints or concerns otherwise noted.  He was admitted with high-grade SBO secondary to large periumbilical incisional hernia.  His PD catheter crosses the hernia sac, and therefore it appears he cannot have peritoneal dialysis.  IR was consulted for hemodialysis catheter placement, but would like nephrology input prior to doing so.  I have spoken with Dr. Jonnie Finner who will evaluate patient prior to dialysis catheter placement.  General surgery has seen patient with plans for NG tube decompression with probable hernia repair on 7/25 versus 7/26.

## 2018-08-08 NOTE — Progress Notes (Signed)
IR requested by Dr. Alcario Drought for possible image-guided HD catheter placement.  Discussed with IR team who recommends nephrology input prior to HD catheter placement.  IR to follow.   Bea Graff Louk, PA-C 08/08/2018, 8:18 AM

## 2018-08-08 NOTE — ED Notes (Signed)
Pt notified RN that he could not drink contrast d/t pain. Pt also stated he does not want pain meds. RN notified EDP.

## 2018-08-08 NOTE — Procedures (Signed)
Interventional Radiology Procedure Note  Procedure: Placement of a right IJ approach tunneled HD catheter 19cm tip to cuff.  Tip is positioned at the superior cavoatrial junction and catheter is ready for immediate use.  Complications: None Recommendations:  - Ok to use - Do not submerge - Routine line care   Signed,  Jamesha Ellsworth S. Goerge Mohr, DO    

## 2018-08-08 NOTE — Consult Note (Addendum)
Alex Morrison [facility] 09/20/1970  [id number].    Requesting MD: Dr. Jennette Kettle Chief Complaint/Reason for Consult: SBO secondary to incarcerated incisional hernia  HPI:  This is a 48 yo white male with a history of HTN and type 1 DM with renal failure who was just started on PD this past December 2019.  He has a history of a perforated appendix requiring an ileocecectomy in 2016 by Dr. Brantley Stage.  He developed an incisional hernia and had this fixed by Dr. Raul Del in [facility] laparoscopically with primary repair and no mesh.  He then underwent placement of a PD catheter at the same time in November of 2019 by Dr. Raul Del as well.    He was doing well until he noticed his hernia had returned about a week ago, but with no issues until yesterday around midday.  He last had a BM yesterday at 0530am.  Around midday he began having abdominal pain across the midsection of his abdomen.  He then developed N/V.  He denies any CP, fevers, SOB, etc.  He presented to [facility] where he has a CT scan that reveals a SBO secondary to an incarcerated incisional hernia.  We have been asked to see him for further recommendations.  ROS: ROS: Please see HPI, otherwise all other systems are currently negative.  Family History  Problem Relation Age of Onset  . Diabetes Mellitus II Mother   . Diabetes Mother   . Heart disease Mother   . Diabetes Mellitus I Father   . Diabetes Father   . Diabetes Mellitus II Brother   . Diabetes Brother     Past Medical History:  Diagnosis Date  . Abscess of buttock   . Acute kidney injury ([place]) 09/27/2013  . Diabetes mellitus without complication ([place])   . HTN (hypertension) 08/08/2018  . Perforated appendix    Peritonitis    Past Surgical History:  Procedure Laterality Date  . Ex lap with ileocecectomy  09/27/13   Dr. Brantley Stage  . INCISION AND DRAINAGE    . insertion of PD catheter  12/2017  . LAPAROSCOPIC ABDOMINAL EXPLORATION N/A 09/27/2013  . LAPAROSCOPIC ASSISTED VENTRAL  HERNIA REPAIR  2019  . LAPAROSCOPY N/A 09/27/2013   Procedure: LAPAROSCOPY DIAGNOSTIC;  Surgeon: Erroll Luna, MD;  Location: [place];  Service: General;  Laterality: N/A;  . [place]    Social History:  reports that he has never smoked. He does not have any smokeless tobacco history on file. He reports that he does not drink alcohol or use drugs.  Allergies: No Known Allergies  (Not in a hospital admission)    Physical Exam: Blood pressure 126/75, pulse (!) 104, temperature 98.6 F (37 C), temperature source Oral, resp. rate 18, SpO2 99 %. General: pleasant, WD, WN white male who is laying in bed in NAD HEENT: head is normocephalic, atraumatic.  Sclera are noninjected.  PERRL.  Ears and nose without any masses or lesions, but some blood noted on right nares secondary to NGT trauma.  NGT has been removed but with about 1L of feculent output in cannister.  Mouth is pink and dry. Heart: regular, rate, and rhythm.  Normal s1,s2. No obvious murmurs, gallops, or rubs noted.  Palpable radial and pedal pulses bilaterally Lungs: CTAB, no wheezes, rhonchi, or rales noted.  Respiratory effort nonlabored Abd: soft, NT, ND, +BS, no masses or organomegaly.  He has multiple healed scars noted.  He has an incisional hernia noted with what feels like  swiss cheese defects.  Focally incarcerated umbilical hernia is able to be reduced and defect is palpable.  The hernia just lateral to this is unable to be reduced.  His PD catheter is palpable and feels just outside of the hernia.   MS: all 4 extremities are symmetrical with no cyanosis, clubbing.  Mild lower extremity pitting edema Skin: warm and dry with no masses, lesions, or rashes Psych: A&Ox3 with an appropriate affect.   Results for orders placed or performed during the hospital encounter of 08/07/18 (from the past 48 hour(s))  Urinalysis, Routine w reflex microscopic     Status: Abnormal   Collection Time: 08/07/18 11:28 PM  Result  Value Ref Range   Color, Urine STRAW (A) YELLOW   APPearance CLEAR CLEAR   Specific Gravity, Urine 1.010 1.005 - 1.030   pH 6.0 5.0 - 8.0   Glucose, UA >=500 (A) NEGATIVE mg/dL   Hgb urine dipstick SMALL (A) NEGATIVE   Bilirubin Urine NEGATIVE NEGATIVE   Ketones, ur NEGATIVE NEGATIVE mg/dL   Protein, ur 100 (A) NEGATIVE mg/dL   Nitrite NEGATIVE NEGATIVE   Leukocytes,Ua NEGATIVE NEGATIVE   RBC / HPF 0-5 0 - 5 RBC/hpf   WBC, UA 0-5 0 - 5 WBC/hpf   Bacteria, UA NONE SEEN NONE SEEN    Comment: Performed at [facility], [address] [place], [place] [address]  Lipase, blood     Status: None   Collection Time: 08/07/18 11:36 PM  Result Value Ref Range   Lipase 27 11 - 51 U/L    Comment: Performed at [facility], [place] [address], [place], [place] [address]  Comprehensive metabolic panel     Status: Abnormal   Collection Time: 08/07/18 11:36 PM  Result Value Ref Range   Sodium 136 135 - 145 mmol/L   Potassium 3.6 3.5 - 5.1 mmol/L   Chloride 96 (L) 98 - 111 mmol/L   CO2 27 22 - 32 mmol/L   Glucose, Bld 263 (H) 70 - 99 mg/dL   BUN 75 (H) 6 - 20 mg/dL   Creatinine, Ser 10.16 (H) 0.61 - 1.24 mg/dL   Calcium 9.4 8.9 - 10.3 mg/dL   Total Protein 5.6 (L) 6.5 - 8.1 g/dL   Albumin 3.2 (L) 3.5 - 5.0 g/dL   AST 17 15 - 41 U/L   ALT 23 0 - 44 U/L   Alkaline Phosphatase 66 38 - 126 U/L   Total Bilirubin 0.6 0.3 - 1.2 mg/dL   GFR calc non Af Amer 5 (L) >60 mL/min   GFR calc Af Amer 6 (L) >60 mL/min   Anion gap 13 5 - 15    Comment: Performed at [facility], [place] [address], [place], [place] [address]  CBC     Status: Abnormal   Collection Time: 08/07/18 11:36 PM  Result Value Ref Range   WBC 9.1 4.0 - 10.5 K/uL   RBC 3.18 (L) 4.22 - 5.81 MIL/uL   Hemoglobin 9.8 (L) 13.0 - 17.0 g/dL   HCT 28.5 (L) 39.0 - 52.0 %   MCV 89.6 80.0 - 100.0 fL   MCH 30.8 26.0 - 34.0 pg   MCHC 34.4 30.0 - 36.0 g/dL   RDW 12.1 11.5 - 15.5 %   Platelets 194 150 - 400 K/uL   nRBC 0.0 0.0 -  0.2 %    Comment: Performed at [facility], [place] [address], [place], [place] [address]  Lactic acid,  plasma     Status: None   Collection Time: 08/08/18  2:19 AM  Result Value Ref Range   Lactic Acid, Venous 1.5 0.5 - 1.9 mmol/L    Comment: Performed at [facility] [address], [place], [place] [address]  Hemoglobin A1c     Status: Abnormal   Collection Time: 08/08/18  5:40 AM  Result Value Ref Range   Hgb A1c MFr Bld 9.2 (H) 4.8 - 5.6 %    Comment: (NOTE) Pre diabetes:          5.7%-6.4% Diabetes:              >6.4% Glycemic control for   <7.0% adults with diabetes    Mean Plasma Glucose 217.34 mg/dL    Comment: Performed at [facility] [address], [place], [place] [address]  CBG monitoring, ED     Status: Abnormal   Collection Time: 08/08/18  5:43 AM  Result Value Ref Range   Glucose-Capillary 258 (H) 70 - 99 mg/dL   Ct Abdomen Pelvis Wo Contrast  Result Date: 08/08/2018 CLINICAL DATA:  Nausea and vomiting with abdominal swelling. EXAM: CT ABDOMEN AND PELVIS WITHOUT CONTRAST TECHNIQUE: Multidetector CT imaging of the abdomen and pelvis was performed following the standard protocol without IV contrast. COMPARISON:  09/27/2013 FINDINGS: Lower chest:  No contributory findings. Hepatobiliary: No focal liver abnormality.No evidence of biliary obstruction or stone. Pancreas: Unremarkable. Spleen: Unremarkable. Adrenals/Urinary Tract: Negative adrenals. No hydronephrosis or stone. Unremarkable bladder. Stomach/Bowel: High-grade small bowel obstruction with dilated fluid-filled loops and mesenteric edema. Bowel loops proximal and distal to the obstructed segments are collapsed and this may reflect a closed loop obstruction. A transition is seen at the level of a large periumbilical hernia which contains distended loops and ascitic fluid. Note that the patient's peritoneal catheter traverses this hernia sac. Bowel sutures in the right upper quadrant. There is history  of perforated appendix surgery. Vascular/Lymphatic: Diffuse arterial calcification. No mass or adenopathy. Reproductive:Negative Other: Small volume ascites/dialysate. Tenckhoff catheter tip is in the anterior left lower quadrant Musculoskeletal: No acute finding IMPRESSION: High-grade small bowel obstruction with a transition point seen at a large periumbilical hernia. The patient's Tenckhoff catheter traverses the hernia sac, which may limit reduction. Proximal small bowel loops are also collapsed, there may be a closed loop obstruction. Electronically Signed   By: Monte Fantasia M.D.   On: 08/08/2018 05:01   Dg Abd Portable 1v  Result Date: 08/08/2018 CLINICAL DATA:  Nasogastric tube placement EXAM: PORTABLE ABDOMEN - 1 VIEW COMPARISON:  Abdominal CT earlier today FINDINGS: Nasogastric tube is not seen over the abdomen or lower chest. Known small-bowel obstruction and Tenckhoff catheter. IMPRESSION: No nasogastric tube visualized over the abdomen or lower chest. Electronically Signed   By: Monte Fantasia M.D.   On: 08/08/2018 06:37      Assessment/Plan Type 1 DM HTN ESRD on PD  SBO secondary to incarcerated incisional hernia I am able to get one component of this hernia reduced, but the largest part I can not get reduced.  NGT should be replaced given significant feculent output initially received. He will require an operation (timing not determined yet) to correct this problem.  I do not suspect his bowel is ischemic at this time as he had no pain with attempted reduction as well as a normal WBC.  The skin over his hernia is not erythematous or cellulitic.  PD catheter appears to be very near his hernia.  Will  try to preserve this, but may need other form of access for dialysis pending this has to be manipulated or removed for some reason.  We discussed the possible need for lap vs open as well as possible SBR if ischemia noted.  The patient understands and agrees with moving forward.  All  medications need to be IV at this time due to persistent bowel obstruction.  FEN - NPO, NGT, IVFs VTE - SCDs ID - none currently  Henreitta Cea, [facility] 08/08/2018, 7:54 AM Pager: [phone]

## 2018-08-08 NOTE — Consult Note (Signed)
Renal Service Consult Note [place] [facility] 08/08/2018 Sol Blazing Requesting Physician:  Dr. Manuella Ghazi, Mamie Nick.   Reason for Consult:  ESRD pt on PD w/ incarc hernia/ SBO HPI: The patient is a 48 y.o. year-old with hx of DM2, HTN, perf appendix in the past who presented last night w/ abd pain w/ N/V.  Pt has ESRD on PD since Dec 2019, f/b Dr Joelyn Oms.  Asked to see for ESRD.    Patient denies any f/c/s, diarrhea, last BM yesterday 5 am.  Has NG tube , gen surg planning for hernia repair tomorrow or Sunday.    ROS  denies CP  no joint pain   no HA  no blurry vision  no rash  no diarrhea  no nausea/ vomiting   Past Medical History  Past Medical History:  Diagnosis Date  . Abscess of buttock   . Acute kidney injury ([place]) 09/27/2013  . Diabetes mellitus without complication ([place])   . HTN (hypertension) 08/08/2018  . Perforated appendix    Peritonitis   Past Surgical History  Past Surgical History:  Procedure Laterality Date  . Ex lap with ileocecectomy  09/27/13   Dr. Brantley Stage  . INCISION AND DRAINAGE    . insertion of PD catheter  12/2017  . LAPAROSCOPIC ABDOMINAL EXPLORATION N/A 09/27/2013  . LAPAROSCOPIC ASSISTED VENTRAL HERNIA REPAIR  2019  . LAPAROSCOPY N/A 09/27/2013   Procedure: LAPAROSCOPY DIAGNOSTIC;  Surgeon: Erroll Luna, MD;  Location: [place];  Service: General;  Laterality: N/A;  . SHOULDER SURGERY  1989   Family History  Family History  Problem Relation Age of Onset  . Diabetes Mellitus II Mother   . Diabetes Mother   . Heart disease Mother   . Diabetes Mellitus I Father   . Diabetes Father   . Diabetes Mellitus II Brother   . Diabetes Brother    Social History  reports that he has never smoked. He does not have any smokeless tobacco history on file. He reports that he does not drink alcohol or use drugs. Allergies No Known Allergies Home medications Prior to Admission medications   Medication Sig Start Date End Date Taking?  Authorizing Provider  amLODipine (NORVASC) 10 MG tablet Take 10 mg by mouth daily. 08/05/18  Yes [provider]  cloNIDine (CATAPRES) 0.1 MG tablet Take 1 tablet by mouth 3 (three) times daily as needed. For BP > 160/90 ( atleast 6 hours apart) 09/14/14  Yes [provider]  furosemide (LASIX) 80 MG tablet Take 80 mg by mouth 2 (two) times a day. 08/01/18  Yes [provider]  insulin aspart (NOVOLOG FLEXPEN) 100 UNIT/ML FlexPen Inject 2-10 Units into the skin 3 (three) times daily. Per sliding scale: 140-199=2 units 200-250=4 units 251-299=6 units 300-349=8 units 350> =10 units   Yes [provider]  Insulin Glargine (LANTUS SOLOSTAR) 100 UNIT/ML Solostar Pen Inject 10 Units into the skin at bedtime.    Yes [provider]  irbesartan (AVAPRO) 300 MG tablet Take 300 mg by mouth daily. 08/23/14  Yes [provider]  metoprolol succinate (TOPROL-XL) 25 MG 24 hr tablet Take 25 mg by mouth daily. 10/06/14  Yes [provider]  sevelamer carbonate (RENVELA) 800 MG tablet Take 2,400 mg by mouth 3 (three) times daily with meals. 08/05/18  Yes [provider]   Liver Function Tests Recent Labs  Lab 08/07/18 2336  AST 17  ALT 23  ALKPHOS 66  BILITOT 0.6  PROT  5.6*  ALBUMIN 3.2*   Recent Labs  Lab 08/07/18 2336  LIPASE 27   CBC Recent Labs  Lab 08/07/18 2336  WBC 9.1  HGB 9.8*  HCT 28.5*  MCV 89.6  PLT 599   Basic Metabolic Panel Recent Labs  Lab 08/07/18 2336  NA 136  K 3.6  CL 96*  CO2 27  GLUCOSE 263*  BUN 75*  CREATININE 10.16*  CALCIUM 9.4   Iron/TIBC/Ferritin/ %Sat    Component Value Date/Time   IRON 50 11/12/2017 1333   TIBC 249 (L) 11/12/2017 1333   IRONPCTSAT 20 11/12/2017 1333    Vitals:   08/08/18 0545 08/08/18 0600 08/08/18 0730 08/08/18 0935  BP: 138/79 132/65 126/75 139/76  Pulse: 98 (!) 104  94  Resp:    16  Temp:    98 F (36.7 C)  TempSrc:    Oral  SpO2: 98% 99%  99%     Exam Gen alert, no distress No rash, cyanosis or gangrene Sclera anicteric, +NG tube to suction  No jvd or bruits Chest clear bilat to bases RRR no MRG Abd firm, dec'd BS, small R periumb hernia palpated, no rebound, old healed scars, RLQ PD cath intact GU normal male MS no joint effusions or deformity Ext no LE edema, no wounds or ulcers Neuro is alert, Ox 3 , nf    Home meds:  - amlodipine 10 / clonidine 0.1 tid/ irbesartan 300 qd/ metoprolol xl 25 qd  - insulin aspart SSI tid 0- 10u  - furosemide 80 bid/ sevelamer carb ac tid    Outpt PD: 4 overnight, 2.3 L fill, 1.5 h dwell, no day bag or pause - got venofer 200mg  6/22 and mircera 75 ug 6/22   Assessment/ Plan: 1. Incarb umbilical hernia w/ SBO: sill need hernia repair surgery and transition to hemodialysis until abdomen heals up post-surgery.  2. ESRD on CCPD: as above, needs TDC and HD for now. Plan 1st HD Sat, will sched around any procedures.  3. Volume - euvolemic on exam 4. HTN - cont meds as needed 5. Anemia ckd - Hb 9.8, follow 6. MBC ckd - con tmeds      Kelly Splinter  MD 08/08/2018, 10:20 AM

## 2018-08-08 NOTE — ED Provider Notes (Signed)
[facility] EMERGENCY DEPARTMENT Provider Note   CSN: [id number] Arrival date & time: 08/07/18  2306    History   Chief Complaint Chief Complaint  Patient presents with  . Abdominal Pain  . Hernia    HPI Alex Morrison is a 48 y.o. male.     48 y/o male with hx of DM, ESRD on peritoneal dialysis (started in 12/2017) presents to the emergency department for evaluation of abdominal pain.  Patient reports onset of abdominal pain yesterday.  This was associated with forceful nausea and vomiting.  He states that abdominal pain has subsided, but vomiting has persisted.  Last episode of emesis was in the waiting room tonight.  The patient became concerned when he noticed a large bulge to his abdomen.  He had a small area of swelling at baseline, but reports that it became much more larger and painful since onset of his abdominal pain and vomiting.  Called his dialysis center who expressed that there may be some issues with his catheter or potential for continued infection.  He has not had any changes to his bowel movements.  Denies associated fevers.  Usually dialyzes daily, but did not dialyze tonight.  Abdominal SHx significant for exploratory laparoscopy with ileocecectomy in the setting of perforated appendix and peritonitis.    Abdominal Pain   Past Medical History:  Diagnosis Date  . Abscess of buttock   . Acute kidney injury ([place]) 09/27/2013  . Diabetes mellitus without complication ([place])   . Perforated appendix    Peritonitis    Patient Active Problem List   Diagnosis Date Noted  . Psoriasis 09/29/2013  . Acute gangrenous appendicitis with perforation and peritonitis 09/27/2013  . Type 1 diabetes mellitus ([place]) 09/27/2013  . Acute kidney injury ([place]) 09/27/2013  . DM (diabetes mellitus) type 2, uncontrolled, with ketoacidosis ([place]) 07/17/2012  . DKA (diabetic ketoacidosis) ([place]) 07/16/2012  . Perirectal abscess 07/14/2012    Past Surgical History:   Procedure Laterality Date  . Ex lap with ileocecectomy  09/27/13   Dr. Brantley Stage  . INCISION AND DRAINAGE    . LAPAROSCOPIC ABDOMINAL EXPLORATION N/A 09/27/2013   Procedure: LAPAROSCOPIC ABDOMINAL EXPLORATION: INTERNAL ILIEUM, CECUM, RIGHT COLON;  Surgeon: Erroll Luna, MD;  Location: [place];  Service: General;  Laterality: N/A;  . LAPAROSCOPY N/A 09/27/2013   Procedure: LAPAROSCOPY DIAGNOSTIC;  Surgeon: Erroll Luna, MD;  Location: [place];  Service: General;  Laterality: N/A;  . [place] Medications    Prior to Admission medications   Medication Sig Start Date End Date Taking? Authorizing Provider  amLODipine (NORVASC) 10 MG tablet Take 10 mg by mouth daily. 08/05/18  Yes [provider]  cloNIDine (CATAPRES) 0.1 MG tablet Take 1 tablet by mouth 3 (three) times daily as needed. For BP > 160/90 ( atleast 6 hours apart) 09/14/14  Yes [provider]  furosemide (LASIX) 80 MG tablet Take 80 mg by mouth 2 (two) times a day. 08/01/18  Yes [provider]  insulin aspart (NOVOLOG FLEXPEN) 100 UNIT/ML FlexPen Inject 2-10 Units into the skin 3 (three) times daily. Per sliding scale: 140-199=2 units 200-250=4 units 251-299=6 units 300-349=8 units 350> =10 units   Yes [provider]  Insulin Glargine (LANTUS SOLOSTAR) 100 UNIT/ML Solostar Pen Inject 10 Units into the skin at bedtime.    Yes [provider]  irbesartan (AVAPRO) 300 MG tablet Take 300 mg by mouth daily. 08/23/14  Yes [provider]  metoprolol succinate (TOPROL-XL) 25 MG 24 hr tablet Take 25 mg by mouth daily. 10/06/14  Yes [provider]  sevelamer carbonate (RENVELA) 800 MG tablet Take 2,400 mg by mouth 3 (three) times daily with meals. 08/05/18  Yes [provider]    Family History Family History  Problem Relation Age of Onset  . Diabetes Mellitus II Mother   . Diabetes Mother   . Heart disease Mother   . Diabetes Mellitus I  Father   . Diabetes Father   . Diabetes Mellitus II Brother   . Diabetes Brother     Social History Social History   Tobacco Use  . Smoking status: Never Smoker  Substance Use Topics  . Alcohol use: No  . Drug use: No     Allergies   Patient has no known allergies.   Review of Systems Review of Systems  Gastrointestinal: Positive for abdominal pain.  Ten systems reviewed and are negative for acute change, except as noted in the HPI.     Physical Exam Updated Vital Signs BP 139/74   Pulse 94   Temp 98.6 F (37 C) (Oral)   Resp 18   SpO2 100%   Physical Exam Vitals signs and nursing note reviewed.  Constitutional:      General: He is not in acute distress.    Appearance: He is well-developed. He is not diaphoretic.     Comments: Calm and cooperative, in no distress.  HENT:     Head: Normocephalic and atraumatic.  Eyes:     General: No scleral icterus.    Conjunctiva/sclera: Conjunctivae normal.  Neck:     Musculoskeletal: Normal range of motion.  Cardiovascular:     Rate and Rhythm: Normal rate and regular rhythm.     Pulses: Normal pulses.  Pulmonary:     Effort: Pulmonary effort is normal. No respiratory distress.  Abdominal:     Comments: Old, well healed surgical midline incision with bulging to the right of midline. Bulge is soft without overlying skin color changes.  There is no reproducible tenderness to palpation.  No peritoneal signs.  Peritoneal dialysis catheter noted in the right mid abdomen.  Musculoskeletal: Normal range of motion.  Skin:    General: Skin is warm and dry.     Coloration: Skin is not pale.     Findings: No erythema or rash.  Neurological:     General: No focal deficit present.     Mental Status: He is alert and oriented to person, place, and time.     Coordination: Coordination normal.  Psychiatric:        Behavior: Behavior normal.      ED Treatments / Results  Labs (all labs ordered are listed, but only abnormal  results are displayed) Labs Reviewed  COMPREHENSIVE METABOLIC PANEL - Abnormal; Notable for the following components:      Result Value   Chloride 96 (*)    Glucose, Bld 263 (*)    BUN 75 (*)    Creatinine, Ser 10.16 (*)    Total Protein 5.6 (*)    Albumin 3.2 (*)    GFR calc non Af Amer 5 (*)    GFR calc Af Amer 6 (*)    All other components within normal limits  CBC - Abnormal; Notable for the following components:   RBC 3.18 (*)    Hemoglobin 9.8 (*)    HCT 28.5 (*)    All other components within  normal limits  URINALYSIS, ROUTINE W REFLEX MICROSCOPIC - Abnormal; Notable for the following components:   Color, Urine STRAW (*)    Glucose, UA >=500 (*)    Hgb urine dipstick SMALL (*)    Protein, ur 100 (*)    All other components within normal limits  SARS CORONAVIRUS 2 (HOSPITAL ORDER, [facility])  LIPASE, BLOOD  LACTIC ACID, PLASMA    EKG None  Radiology Ct Abdomen Pelvis Wo Contrast  Result Date: 08/08/2018 CLINICAL DATA:  Nausea and vomiting with abdominal swelling. EXAM: CT ABDOMEN AND PELVIS WITHOUT CONTRAST TECHNIQUE: Multidetector CT imaging of the abdomen and pelvis was performed following the standard protocol without IV contrast. COMPARISON:  09/27/2013 FINDINGS: Lower chest:  No contributory findings. Hepatobiliary: No focal liver abnormality.No evidence of biliary obstruction or stone. Pancreas: Unremarkable. Spleen: Unremarkable. Adrenals/Urinary Tract: Negative adrenals. No hydronephrosis or stone. Unremarkable bladder. Stomach/Bowel: High-grade small bowel obstruction with dilated fluid-filled loops and mesenteric edema. Bowel loops proximal and distal to the obstructed segments are collapsed and this may reflect a closed loop obstruction. A transition is seen at the level of a large periumbilical hernia which contains distended loops and ascitic fluid. Note that the patient's peritoneal catheter traverses this hernia sac. Bowel sutures in  the right upper quadrant. There is history of perforated appendix surgery. Vascular/Lymphatic: Diffuse arterial calcification. No mass or adenopathy. Reproductive:Negative Other: Small volume ascites/dialysate. Tenckhoff catheter tip is in the anterior left lower quadrant Musculoskeletal: No acute finding IMPRESSION: High-grade small bowel obstruction with a transition point seen at a large periumbilical hernia. The patient's Tenckhoff catheter traverses the hernia sac, which may limit reduction. Proximal small bowel loops are also collapsed, there may be a closed loop obstruction. Electronically Signed   By: Monte Fantasia M.D.   On: 08/08/2018 05:01    Procedures Procedures (including critical care time)  Medications Ordered in ED Medications  sodium chloride flush (NS) 0.9 % injection 3 mL (has no administration in time range)  fentaNYL (SUBLIMAZE) injection 50 mcg (has no administration in time range)  metoCLOPramide (REGLAN) injection 10 mg (10 mg Intravenous Not Given 08/08/18 0441)    5:15 AM Case discussed with Dr. Grandville Silos of general surgery.  He will consult on the patient's case.  Requests admission to the hospitalist service.   Initial Impression / Assessment and Plan / ED Course  I have reviewed the triage vital signs and the nursing notes.  Pertinent labs & imaging results that were available during my care of the patient were reviewed by me and considered in my medical decision making (see chart for details).        48 year old male to be admitted for management of small bowel obstruction.  Transition point at large periumbilical hernia.  The patient has been hemodynamically stable.  General surgery to consult.  Plan for admission to hospitalist service given chronic comorbidities and peritoneal dialysis.  Patient updated and agreeable to admission.   Final Clinical Impressions(s) / ED Diagnoses   Final diagnoses:  Small bowel obstruction [facility])    ED Discharge Orders     None       Antonietta Breach, PA-C 08/08/18 0522    Ward, Delice Bison, DO 08/08/18 [phone]

## 2018-08-08 NOTE — Plan of Care (Signed)
  Problem: Education: Goal: Knowledge of General Education information will improve Description: Including pain rating scale, medication(s)/side effects and non-pharmacologic comfort measures Outcome: Progressing   Problem: Elimination: Goal: Will not experience complications related to bowel motility Outcome: Progressing   Problem: Safety: Goal: Ability to remain free from injury will improve Outcome: Progressing   Problem: Coping: Goal: Level of anxiety will decrease Outcome: Progressing   Problem: Activity: Goal: Risk for activity intolerance will decrease Outcome: Progressing

## 2018-08-08 NOTE — Progress Notes (Signed)
Returns from IR via stretcher.Patient arouses easily and answers questions appropriately.  HD catheter noted with clean and dry dressing intact.  NG was reconnected to intermittent wall suction- a dark greenish color liquid noted.

## 2018-08-08 NOTE — Sedation Documentation (Signed)
Vital signs stable. 

## 2018-08-08 NOTE — Consult Note (Signed)
Chief Complaint: Patient was seen in consultation today for renal failure  Referring Physician(s): Dr. Jonnie Finner  Supervising Physician: Corrie Mckusick  Patient Status: [facility] - In-pt  History of Present Illness: Alex Morrison is a 48 y.o. male with past medical history of DM, HTN, renal failure on PD since December of 2019 who presented to [facility] ED with nausea and vomiting.  Patient found to have a SBO with hernia which will require surgical repair.  NGT currently in place.  IR consulted for tunneled HD catheter placement due to potentially compromised PD catheter.   Patient NPO.  Not on blood thinners.  INR 1.0  Past Medical History:  Diagnosis Date   Abscess of buttock    Acute kidney injury ([place]) 09/27/2013   Diabetes mellitus without complication (HCC)    HTN (hypertension) 08/08/2018   Perforated appendix    Peritonitis    Past Surgical History:  Procedure Laterality Date   Ex lap with ileocecectomy  09/27/13   Dr. Brantley Stage   INCISION AND DRAINAGE     insertion of PD catheter  12/2017   LAPAROSCOPIC ABDOMINAL EXPLORATION N/A 09/27/2013   LAPAROSCOPIC ASSISTED VENTRAL HERNIA REPAIR  2019   LAPAROSCOPY N/A 09/27/2013   Procedure: LAPAROSCOPY DIAGNOSTIC;  Surgeon: Erroll Luna, MD;  Location: [place];  Service: General;  Laterality: N/A;   SHOULDER SURGERY  1989    Allergies: Patient has no known allergies.  Medications: Prior to Admission medications   Medication Sig Start Date End Date Taking? Authorizing Provider  amLODipine (NORVASC) 10 MG tablet Take 10 mg by mouth daily. 08/05/18  Yes [provider]  cloNIDine (CATAPRES) 0.1 MG tablet Take 1 tablet by mouth 3 (three) times daily as needed. For BP > 160/90 ( atleast 6 hours apart) 09/14/14  Yes [provider]  furosemide (LASIX) 80 MG tablet Take 80 mg by mouth 2 (two) times a day. 08/01/18  Yes [provider]  insulin aspart (NOVOLOG FLEXPEN) 100 UNIT/ML FlexPen Inject 2-10  Units into the skin 3 (three) times daily. Per sliding scale: 140-199=2 units 200-250=4 units 251-299=6 units 300-349=8 units 350> =10 units   Yes [provider]  Insulin Glargine (LANTUS SOLOSTAR) 100 UNIT/ML Solostar Pen Inject 10 Units into the skin at bedtime.    Yes [provider]  irbesartan (AVAPRO) 300 MG tablet Take 300 mg by mouth daily. 08/23/14  Yes [provider]  metoprolol succinate (TOPROL-XL) 25 MG 24 hr tablet Take 25 mg by mouth daily. 10/06/14  Yes [provider]  sevelamer carbonate (RENVELA) 800 MG tablet Take 2,400 mg by mouth 3 (three) times daily with meals. 08/05/18  Yes [provider]     Family History  Problem Relation Age of Onset   Diabetes Mellitus II Mother    Diabetes Mother    Heart disease Mother    Diabetes Mellitus I Father    Diabetes Father    Diabetes Mellitus II Brother    Diabetes Brother     Social History   Socioeconomic History   Marital status: Single    Spouse name: Not on file   Number of children: Not on file   Years of education: Not on file   Highest education level: Not on file  Occupational History   Not on file  Social Needs   Financial resource strain: Not on file   Food insecurity    Worry: Not on file    Inability: Not on file   Transportation needs  Medical: Not on file    Non-medical: Not on file  Tobacco Use   Smoking status: Never Smoker  Substance and Sexual Activity   Alcohol use: No   Drug use: No   Sexual activity: Not on file  Lifestyle   Physical activity    Days per week: Not on file    Minutes per session: Not on file   Stress: Not on file  Relationships   Social connections    Talks on phone: Not on file    Gets together: Not on file    Attends religious service: Not on file    Active member of club or organization: Not on file    Attends meetings of clubs or organizations: Not on file    Relationship status: Not on  file  Other Topics Concern   Not on file  Social History Narrative   Not on file     Review of Systems: A 12 point ROS discussed and pertinent positives are indicated in the HPI above.  All other systems are negative.  Review of Systems  Constitutional: Negative for fatigue and fever.  Respiratory: Negative for cough and shortness of breath.   Cardiovascular: Negative for chest pain.  Gastrointestinal: Positive for abdominal pain, nausea and vomiting.  Musculoskeletal: Negative for back pain.  Psychiatric/Behavioral: Negative for behavioral problems and confusion.    Vital Signs: BP 139/76 (BP Location: Right Arm)    Pulse 94    Temp 98 F (36.7 C) (Oral)    Resp 16    SpO2 99%   Physical Exam Vitals signs and nursing note reviewed.  Constitutional:      Appearance: He is well-developed.  Cardiovascular:     Rate and Rhythm: Normal rate and regular rhythm.     Heart sounds: No murmur. No friction rub. No gallop.   Pulmonary:     Effort: Pulmonary effort is normal. No respiratory distress.     Breath sounds: Normal breath sounds.  Skin:    General: Skin is warm and dry.  Neurological:     General: No focal deficit present.     Mental Status: He is alert and oriented to person, place, and time.  Psychiatric:        Mood and Affect: Mood normal.        Behavior: Behavior normal.      MD Evaluation Airway: WNL Heart: WNL Abdomen: WNL Chest/ Lungs: WNL ASA  Classification: 3 Mallampati/Airway Score: One   Imaging: Ct Abdomen Pelvis Wo Contrast  Result Date: 08/08/2018 CLINICAL DATA:  Nausea and vomiting with abdominal swelling. EXAM: CT ABDOMEN AND PELVIS WITHOUT CONTRAST TECHNIQUE: Multidetector CT imaging of the abdomen and pelvis was performed following the standard protocol without IV contrast. COMPARISON:  09/27/2013 FINDINGS: Lower chest:  No contributory findings. Hepatobiliary: No focal liver abnormality.No evidence of biliary obstruction or stone.  Pancreas: Unremarkable. Spleen: Unremarkable. Adrenals/Urinary Tract: Negative adrenals. No hydronephrosis or stone. Unremarkable bladder. Stomach/Bowel: High-grade small bowel obstruction with dilated fluid-filled loops and mesenteric edema. Bowel loops proximal and distal to the obstructed segments are collapsed and this may reflect a closed loop obstruction. A transition is seen at the level of a large periumbilical hernia which contains distended loops and ascitic fluid. Note that the patient's peritoneal catheter traverses this hernia sac. Bowel sutures in the right upper quadrant. There is history of perforated appendix surgery. Vascular/Lymphatic: Diffuse arterial calcification. No mass or adenopathy. Reproductive:Negative Other: Small volume ascites/dialysate. Tenckhoff catheter tip is in the anterior  left lower quadrant Musculoskeletal: No acute finding IMPRESSION: High-grade small bowel obstruction with a transition point seen at a large periumbilical hernia. The patient's Tenckhoff catheter traverses the hernia sac, which may limit reduction. Proximal small bowel loops are also collapsed, there may be a closed loop obstruction. Electronically Signed   By: Monte Fantasia M.D.   On: 08/08/2018 05:01   Dg Abd Portable 1v  Result Date: 08/08/2018 CLINICAL DATA:  Nasogastric tube placement EXAM: PORTABLE ABDOMEN - 1 VIEW COMPARISON:  Abdominal CT earlier today FINDINGS: Nasogastric tube is not seen over the abdomen or lower chest. Known small-bowel obstruction and Tenckhoff catheter. IMPRESSION: No nasogastric tube visualized over the abdomen or lower chest. Electronically Signed   By: Monte Fantasia M.D.   On: 08/08/2018 06:37    Labs:  CBC: Recent Labs    10/15/17 1404 11/12/17 1327 08/07/18 2336  WBC  --   --  9.1  HGB 8.3* 8.3* 9.8*  HCT  --   --  28.5*  PLT  --   --  194    COAGS: Recent Labs    08/08/18 0631  INR 1.0    BMP: Recent Labs    11/12/17 1329 08/07/18 2336    NA 139 136  K 4.8 3.6  CL 110 96*  CO2 16* 27  GLUCOSE 136* 263*  BUN 91* 75*  CALCIUM 8.6* 9.4  CREATININE 7.18* 10.16*  GFRNONAA 8* 5*  GFRAA 9* 6*    LIVER FUNCTION TESTS: Recent Labs    11/12/17 1329 08/07/18 2336  BILITOT  --  0.6  AST  --  17  ALT  --  23  ALKPHOS  --  66  PROT  --  5.6*  ALBUMIN 3.1* 3.2*    TUMOR MARKERS: No results for input(s): AFPTM, CEA, CA199, CHROMGRNA in the last 8760 hours.  Assessment and Plan: Renal failure on HD via PD catheter, potentially compromised due to hernia IR consulted for tunneled HD catheter placement at the request of Nephrology.  Patient NPO.  INR 1.0  Risks and benefits discussed with the patient including, but not limited to bleeding, infection, vascular injury, pneumothorax which may require chest tube placement, air embolism or even death  All of the patient's questions were answered, patient is agreeable to proceed. Consent signed and in chart.  Thank you for this interesting consult.  I greatly enjoyed meeting TRAVIS MASTEL and look forward to participating in their care.  A copy of this report was sent to the requesting provider on this date.  Electronically Signed: Docia Barrier, PA 08/08/2018, 11:44 AM   I spent a total of 40 Minutes    in face to face in clinical consultation, greater than 50% of which was counseling/coordinating care for renal failure.

## 2018-08-08 NOTE — Progress Notes (Signed)
Patient is transported to IR for HD catheter.  The patient is alert and oriented.  NG was clamped for transportation.  NG has large output today.

## 2018-08-08 NOTE — ED Notes (Signed)
RN walked into room pt stated that NG "fell out" of nose when he was turning. This RN and second Rn had previously secured NG.

## 2018-08-09 LAB — COMPREHENSIVE METABOLIC PANEL
ALT: 16 U/L (ref 0–44)
AST: 14 U/L — ABNORMAL LOW (ref 15–41)
Albumin: 2.9 g/dL — ABNORMAL LOW (ref 3.5–5.0)
Alkaline Phosphatase: 54 U/L (ref 38–126)
Anion gap: 18 — ABNORMAL HIGH (ref 5–15)
BUN: 89 mg/dL — ABNORMAL HIGH (ref 6–20)
CO2: 32 mmol/L (ref 22–32)
Calcium: 9.3 mg/dL (ref 8.9–10.3)
Chloride: 90 mmol/L — ABNORMAL LOW (ref 98–111)
Creatinine, Ser: 12.47 mg/dL — ABNORMAL HIGH (ref 0.61–1.24)
GFR calc Af Amer: 5 mL/min — ABNORMAL LOW (ref >60)
GFR calc non Af Amer: 4 mL/min — ABNORMAL LOW (ref >60)
Glucose, Bld: 137 mg/dL — ABNORMAL HIGH (ref 70–99)
Potassium: 3.3 mmol/L — ABNORMAL LOW (ref 3.5–5.1)
Sodium: 140 mmol/L (ref 135–145)
Total Bilirubin: 0.7 mg/dL (ref 0.3–1.2)
Total Protein: 5.4 g/dL — ABNORMAL LOW (ref 6.5–8.1)

## 2018-08-09 LAB — CBC
HCT: 29.3 % — ABNORMAL LOW (ref 39.0–52.0)
Hemoglobin: 9.8 g/dL — ABNORMAL LOW (ref 13.0–17.0)
MCH: 30.7 pg (ref 26.0–34.0)
MCHC: 33.4 g/dL (ref 30.0–36.0)
MCV: 91.8 fL (ref 80.0–100.0)
Platelets: 165 10*3/uL (ref 150–400)
RBC: 3.19 MIL/uL — ABNORMAL LOW (ref 4.22–5.81)
RDW: 12.1 % (ref 11.5–15.5)
WBC: 9.5 10*3/uL (ref 4.0–10.5)
nRBC: 0 % (ref 0.0–0.2)

## 2018-08-09 LAB — HEPATITIS B SURFACE ANTIGEN: Hepatitis B Surface Ag: NEGATIVE

## 2018-08-09 LAB — GLUCOSE, CAPILLARY
Glucose-Capillary: 110 mg/dL — ABNORMAL HIGH (ref 70–99)
Glucose-Capillary: 135 mg/dL — ABNORMAL HIGH (ref 70–99)
Glucose-Capillary: 153 mg/dL — ABNORMAL HIGH (ref 70–99)
Glucose-Capillary: 162 mg/dL — ABNORMAL HIGH (ref 70–99)
Glucose-Capillary: 89 mg/dL (ref 70–99)

## 2018-08-09 MED ORDER — HEPARIN SODIUM (PORCINE) 1000 UNIT/ML IJ SOLN
INTRAMUSCULAR | Status: AC
  Administered 2018-08-09: 3200 [IU]
  Filled 2018-08-09: qty 4

## 2018-08-09 MED ORDER — MENTHOL 3 MG MT LOZG: 1.0000 | LOZENGE | OROMUCOSAL | Status: DC | PRN

## 2018-08-09 MED ORDER — PHENOL 1.4 % MT LIQD: 1.0000 | OROMUCOSAL | Status: DC | PRN

## 2018-08-09 MED ORDER — PANTOPRAZOLE SODIUM 40 MG IV SOLR
40.0000 mg | INTRAVENOUS | Status: DC
  Administered 2018-08-09 – 2018-08-13 (×3): 40 mg via INTRAVENOUS
  Filled 2018-08-09 (×3): qty 40

## 2018-08-09 NOTE — Progress Notes (Signed)
1200 received pt from Hemodialysis via bed. A&O x4. NGT connected to low intermittent suction with dark brown output noted. Abd is soft, no c/o pain. Right hemodialysis cath with dressing dry and intact.

## 2018-08-09 NOTE — Progress Notes (Signed)
[facility] Progress Note     Subjective: CC: hunger pains Patient denies abdominal pain aside from hunger pains. Denies nausea or vomiting. No flatus. Seen in HD this AM.   Objective: Vital signs in last 24 hours: Temp:  [98 F (36.7 C)-99.5 F (37.5 C)] 99.1 F (37.3 C) (07/25 0739) Pulse Rate:  [90-100] 93 (07/25 0915) Resp:  [14-24] 14 (07/25 0412) BP: (96-139)/(56-76) 123/69 (07/25 0915) SpO2:  [96 %-100 %] 98 % (07/25 0412) Last BM Date: 08/07/18  Intake/Output from previous day: 07/24 0701 - 07/25 0700 In: 1803.3 [I.V.:1003.3; NG/GT:800] Out: 2400 [Emesis/NG output:2400] Intake/Output this shift: No intake/output data recorded.  PE: Gen:  Alert, NAD Card:  Regular rate and rhythm Pulm:  Normal effort, clear to auscultation bilaterally Abd: Soft, non-tender, non-distended, +BS, incisional hernia noted with what feels like swiss cheese defects.  Focally incarcerated umbilical hernia is able to be reduced and defect is palpable.  The hernia just lateral to this is unable to be reduced.  His PD catheter is palpable and feels just outside of the hernia.   Skin: warm and dry, no rashes  Psych: A&Ox3   Lab Results:  Recent Labs    08/07/18 2336 08/09/18 0348  WBC 9.1 9.5  HGB 9.8* 9.8*  HCT 28.5* 29.3*  PLT 194 165   BMET Recent Labs    08/07/18 2336 08/09/18 0348  NA 136 140  K 3.6 3.3*  CL 96* 90*  CO2 27 32  GLUCOSE 263* 137*  BUN 75* 89*  CREATININE 10.16* 12.47*  CALCIUM 9.4 9.3   PT/INR Recent Labs    08/08/18 0631  LABPROT 12.7  INR 1.0   CMP     Component Value Date/Time   NA 140 08/09/2018 0348   K 3.3 (L) 08/09/2018 0348   CL 90 (L) 08/09/2018 0348   CO2 32 08/09/2018 0348   GLUCOSE 137 (H) 08/09/2018 0348   BUN 89 (H) 08/09/2018 0348   CREATININE 12.47 (H) 08/09/2018 0348   CALCIUM 9.3 08/09/2018 0348   PROT 5.4 (L) 08/09/2018 0348   ALBUMIN 2.9 (L) 08/09/2018 0348   AST 14 (L) 08/09/2018 0348   ALT 16 08/09/2018  0348   ALKPHOS 54 08/09/2018 0348   BILITOT 0.7 08/09/2018 0348   GFRNONAA 4 (L) 08/09/2018 0348   GFRAA 5 (L) 08/09/2018 0348   Lipase     Component Value Date/Time   LIPASE 27 08/07/2018 2336       Studies/Results: Ct Abdomen Pelvis Wo Contrast  Result Date: 08/08/2018 CLINICAL DATA:  Nausea and vomiting with abdominal swelling. EXAM: CT ABDOMEN AND PELVIS WITHOUT CONTRAST TECHNIQUE: Multidetector CT imaging of the abdomen and pelvis was performed following the standard protocol without IV contrast. COMPARISON:  09/27/2013 FINDINGS: Lower chest:  No contributory findings. Hepatobiliary: No focal liver abnormality.No evidence of biliary obstruction or stone. Pancreas: Unremarkable. Spleen: Unremarkable. Adrenals/Urinary Tract: Negative adrenals. No hydronephrosis or stone. Unremarkable bladder. Stomach/Bowel: High-grade small bowel obstruction with dilated fluid-filled loops and mesenteric edema. Bowel loops proximal and distal to the obstructed segments are collapsed and this may reflect a closed loop obstruction. A transition is seen at the level of a large periumbilical hernia which contains distended loops and ascitic fluid. Note that the patient's peritoneal catheter traverses this hernia sac. Bowel sutures in the right upper quadrant. There is history of perforated appendix surgery. Vascular/Lymphatic: Diffuse arterial calcification. No mass or adenopathy. Reproductive:Negative Other: Small volume ascites/dialysate. Tenckhoff catheter tip is in the anterior left  lower quadrant Musculoskeletal: No acute finding IMPRESSION: High-grade small bowel obstruction with a transition point seen at a large periumbilical hernia. The patient's Tenckhoff catheter traverses the hernia sac, which may limit reduction. Proximal small bowel loops are also collapsed, there may be a closed loop obstruction. Electronically Signed   By: Monte Fantasia M.D.   On: 08/08/2018 05:01   Ir Fluoro Guide Cv Line  Right  Result Date: 08/08/2018 INDICATION: 48 year old male with a history renal failure EXAM: IMAGE GUIDED PLACEMENT OF TUNNELED HEMODIALYSIS CATHETER MEDICATIONS: 2 G ANCEF; The antibiotic was administered within an appropriate time interval prior to skin puncture. ANESTHESIA/SEDATION: Moderate (conscious) sedation was employed during this procedure. A total of Versed 2.0 mg and Fentanyl 100 mcg was administered intravenously. Moderate Sedation Time: 13 minutes. The patient's level of consciousness and vital signs were monitored continuously by radiology nursing throughout the procedure under my direct supervision. FLUOROSCOPY TIME:  Fluoroscopy Time: 0 minutes 6 seconds (1 mGy). COMPLICATIONS: None PROCEDURE: Informed written consent was obtained from the patient after a discussion of the risks, benefits, and alternatives to treatment. Questions regarding the procedure were encouraged and answered. The right neck and chest were prepped with chlorhexidine in a sterile fashion, and a sterile drape was applied covering the operative field. Maximum barrier sterile technique with sterile gowns and gloves were used for the procedure. A timeout was performed prior to the initiation of the procedure. After creating a small venotomy incision, a micropuncture kit was utilized to access the right internal jugular vein under direct, real-time ultrasound guidance after the overlying soft tissues were anesthetized with 1% lidocaine with epinephrine. Ultrasound image documentation was performed. The microwire was marked to measure appropriate internal catheter length. External tunneled length was estimated. A total tip to cuff length of 19 cm was selected. Skin and subcutaneous tissues of chest wall below the clavicle were generously infiltrated with 1% lidocaine for local anesthesia. A small stab incision was made with 11 blade scalpel. The selected hemodialysis catheter was tunneled in a retrograde fashion from the anterior  chest wall to the venotomy incision. A guidewire was advanced to the level of the IVC and the micropuncture sheath was exchanged for a peel-away sheath. The catheter was then placed through the peel-away sheath with tips ultimately positioned within the superior aspect of the right atrium. Final catheter positioning was confirmed and documented with a spot radiographic image. The catheter aspirates and flushes normally. The catheter was flushed with appropriate volume heparin dwells. The catheter exit site was secured with a 0-Prolene retention suture. The venotomy incision was closed Derma bond and sterile dressing. Dressings were applied at the chest wall. Patient tolerated the procedure well and remained hemodynamically stable throughout. No complications were encountered and no significant blood loss encountered. IMPRESSION: Status post right IJ tunneled hemodialysis catheter placement. Catheter ready for use. Signed, Dulcy Fanny. Dellia Nims, RPVI [facility] Radiology Electronically Signed   By: Corrie Mckusick D.O.   On: 08/08/2018 17:05   Ir US Guide Vasc Access Right  Result Date: 08/08/2018 INDICATION: 48 year old male with a history renal failure EXAM: IMAGE GUIDED PLACEMENT OF TUNNELED HEMODIALYSIS CATHETER MEDICATIONS: 2 G ANCEF; The antibiotic was administered within an appropriate time interval prior to skin puncture. ANESTHESIA/SEDATION: Moderate (conscious) sedation was employed during this procedure. A total of Versed 2.0 mg and Fentanyl 100 mcg was administered intravenously. Moderate Sedation Time: 13 minutes. The patient's level of consciousness and vital signs were monitored continuously by radiology  nursing throughout the procedure under my direct supervision. FLUOROSCOPY TIME:  Fluoroscopy Time: 0 minutes 6 seconds (1 mGy). COMPLICATIONS: None PROCEDURE: Informed written consent was obtained from the patient after a discussion of the risks,  benefits, and alternatives to treatment. Questions regarding the procedure were encouraged and answered. The right neck and chest were prepped with chlorhexidine in a sterile fashion, and a sterile drape was applied covering the operative field. Maximum barrier sterile technique with sterile gowns and gloves were used for the procedure. A timeout was performed prior to the initiation of the procedure. After creating a small venotomy incision, a micropuncture kit was utilized to access the right internal jugular vein under direct, real-time ultrasound guidance after the overlying soft tissues were anesthetized with 1% lidocaine with epinephrine. Ultrasound image documentation was performed. The microwire was marked to measure appropriate internal catheter length. External tunneled length was estimated. A total tip to cuff length of 19 cm was selected. Skin and subcutaneous tissues of chest wall below the clavicle were generously infiltrated with 1% lidocaine for local anesthesia. A small stab incision was made with 11 blade scalpel. The selected hemodialysis catheter was tunneled in a retrograde fashion from the anterior chest wall to the venotomy incision. A guidewire was advanced to the level of the IVC and the micropuncture sheath was exchanged for a peel-away sheath. The catheter was then placed through the peel-away sheath with tips ultimately positioned within the superior aspect of the right atrium. Final catheter positioning was confirmed and documented with a spot radiographic image. The catheter aspirates and flushes normally. The catheter was flushed with appropriate volume heparin dwells. The catheter exit site was secured with a 0-Prolene retention suture. The venotomy incision was closed Derma bond and sterile dressing. Dressings were applied at the chest wall. Patient tolerated the procedure well and remained hemodynamically stable throughout. No complications were encountered and no significant blood  loss encountered. IMPRESSION: Status post right IJ tunneled hemodialysis catheter placement. Catheter ready for use. Signed, Dulcy Fanny. Dellia Nims, RPVI [facility] Radiology Electronically Signed   By: Corrie Mckusick D.O.   On: 08/08/2018 17:05   Dg Abd Portable 1v  Result Date: 08/08/2018 CLINICAL DATA:  Nasogastric tube placement EXAM: PORTABLE ABDOMEN - 1 VIEW COMPARISON:  Abdominal CT earlier today FINDINGS: Nasogastric tube is not seen over the abdomen or lower chest. Known small-bowel obstruction and Tenckhoff catheter. IMPRESSION: No nasogastric tube visualized over the abdomen or lower chest. Electronically Signed   By: Monte Fantasia M.D.   On: 08/08/2018 06:37    Anti-infectives: Anti-infectives (From admission, onward)   Start     Dose/Rate Route Frequency Ordered Stop   08/08/18 1645  ceFAZolin (ANCEF) IVPB 2g/100 mL premix     2 g 200 mL/hr over 30 Minutes Intravenous  Once 08/08/18 1632 08/08/18 1702   08/08/18 1620  ceFAZolin (ANCEF) 2-4 GM/100ML-% IVPB    Note to Pharmacy: Desiree Hane   : cabinet override      08/08/18 1620 08/09/18 0429       Assessment/Plan Type 1 DM HTN ESRD on PD  SBO secondary to incarcerated incisional hernia - NGT with 2400 out, continue on LIWS - mobilize as able - ok to have some ice chips for comfort - possible OR early next week for hernia repair   FEN - NPO, NGT, IVFs VTE - SCDs ID - none currently  LOS: 1 day    Brigid Re , [facility]  08/09/2018, 9:20 AM Pager: [phone] Consults: [phone]

## 2018-08-09 NOTE — Progress Notes (Addendum)
PROGRESS NOTE [facility]   Alex Morrison   ZDG:[id number] DOB: December 23, 1970  DOA: 08/07/2018 PCP: Haywood Pao, MD   Brief Narrative:  Alex Morrison is a 48 year old male with past medical history of diabetes mellitus type 1, hypertension, end-stage renal disease on peritoneal dialysis.  Patient presented to the ED for evaluation of abdominal pain.  Patient was not noted to have a large abdominal hernia, upon ED evaluation CT of the abdomen showed high-grade SBO involving PD catheter.  Patient admitted with working diagnosis of SBO and hemodialysis  Subjective: Patient seen and examined at the dialysis unit he has no complaints.  NGT in place, abdominal pain has diminished he is passing gas.  No bowel movement yet.  IR placed tunneled catheter for hemodialysis.  Assessment & Plan:   Principal Problem:   Abdominal hernia as complication of peritoneal dialysis Active Problems:   Type 1 diabetes mellitus (HCC)   ESRD (end stage renal disease) (HCC)   HTN (hypertension)   SBO (small bowel obstruction) (HCC)   Periumbilical hernia   Incarcerated periumbilical hernia/high-grade SBO General surgery recommendations appreciated, for now we will continue NG tube, and ambulation.  They will plan to have repair possibly early this week, given PD catheter.  Continue management per GS.  Continue antibiotics for now.  End-stage renal disease Renal recommendations appreciated, went for hemodialysis today and tolerated well.  Continue HD per nephrology schedule.  Diabetes mellitus type 1 Stable, continue home Lantus and sliding scale.  Patient n.p.o. monitor glucose closely and avoid hypoglycemia.  Hypertension BP stable, continue amlodipine and metoprolol.  Holding Lasix and ibesartan given that was awaiting for hemodialysis.  Resume when if nephrology agrees.  DVT prophylaxis: SCDs Code Status: Full code Family Communication: None at bedside Disposition Plan: Pending  improvement   Consultants:   Surgery  Nephrology  IR  Procedures:   HD catheter placement 7/24  Antimicrobials: Anti-infectives (From admission, onward)   Start     Dose/Rate Route Frequency Ordered Stop   08/08/18 1645  ceFAZolin (ANCEF) IVPB 2g/100 mL premix     2 g 200 mL/hr over 30 Minutes Intravenous  Once 08/08/18 1632 08/08/18 1702   08/08/18 1620  ceFAZolin (ANCEF) 2-4 GM/100ML-% IVPB    Note to Pharmacy: Desiree Hane   : cabinet override      08/08/18 1620 08/09/18 0429          Objective: Vitals:   08/09/18 1000 08/09/18 1030 08/09/18 1113 08/09/18 1159  BP: 115/68 107/60 107/73 118/70  Pulse: 95 90 85 90  Resp:   16 18  Temp:   98.9 F (37.2 C) 99.1 F (37.3 C)  TempSrc:   Oral Oral  SpO2:   96% 98%  Weight:        Intake/Output Summary (Last 24 hours) at 08/09/2018 1742 Last data filed at 08/09/2018 1500 Gross per 24 hour  Intake 1803.33 ml  Output 1367 ml  Net 436.33 ml   Filed Weights   08/09/18 0739  Weight: 86.8 kg    Examination:  General exam: Appears calm and comfortable  HEENT: AC/AT, PERRLA, OP moist and clear.  NG tube in place with brown secretions. Respiratory system: Clear to auscultation. No wheezes,crackle or rhonchi Cardiovascular system: S1 & S2 heard, RRR. No JVD, murmurs, rubs or gallops Gastrointestinal system: Abdomen soft mild distended, mild tender to palpation. Central nervous system: Alert and oriented.  Extremities: No pedal edema. Symmetric  Skin: No rashes, lesions or ulcers.  Data Reviewed: I have personally reviewed following labs and imaging studies  CBC: Recent Labs  Lab 08/07/18 2336 08/09/18 0348  WBC 9.1 9.5  HGB 9.8* 9.8*  HCT 28.5* 29.3*  MCV 89.6 91.8  PLT 194 790   Basic Metabolic Panel: Recent Labs  Lab 08/07/18 2336 08/09/18 0348  NA 136 140  K 3.6 3.3*  CL 96* 90*  CO2 27 32  GLUCOSE 263* 137*  BUN 75* 89*  CREATININE 10.16* 12.47*  CALCIUM 9.4 9.3   GFR: CrCl  cannot be calculated (Unknown ideal weight.). Liver Function Tests: Recent Labs  Lab 08/07/18 2336 08/09/18 0348  AST 17 14*  ALT 23 16  ALKPHOS 66 54  BILITOT 0.6 0.7  PROT 5.6* 5.4*  ALBUMIN 3.2* 2.9*   Recent Labs  Lab 08/07/18 2336  LIPASE 27   No results for input(s): AMMONIA in the last 168 hours. Coagulation Profile: Recent Labs  Lab 08/08/18 0631  INR 1.0   Cardiac Enzymes: No results for input(s): CKTOTAL, CKMB, CKMBINDEX, TROPONINI in the last 168 hours. BNP (last 3 results) No results for input(s): PROBNP in the last 8760 hours. HbA1C: Recent Labs    08/08/18 0540  HGBA1C 9.2*   CBG: Recent Labs  Lab 08/08/18 2006 08/09/18 0023 08/09/18 0432 08/09/18 1154 08/09/18 1608  GLUCAP 192* 89 135* 110* 162*   Lipid Profile: No results for input(s): CHOL, HDL, LDLCALC, TRIG, CHOLHDL, LDLDIRECT in the last 72 hours. Thyroid Function Tests: No results for input(s): TSH, T4TOTAL, FREET4, T3FREE, THYROIDAB in the last 72 hours. Anemia Panel: No results for input(s): VITAMINB12, FOLATE, FERRITIN, TIBC, IRON, RETICCTPCT in the last 72 hours. Sepsis Labs: Recent Labs  Lab 08/08/18 0219  LATICACIDVEN 1.5    Recent Results (from the past 240 hour(s))  SARS Coronavirus 2 (CEPHEID - Performed in [facility] hospital lab), Hosp Order     Status: None   Collection Time: 08/08/18  5:12 AM   Specimen: Nasopharyngeal Swab  Result Value Ref Range Status   SARS Coronavirus 2 NEGATIVE NEGATIVE Final    Comment: (NOTE) If result is NEGATIVE SARS-CoV-2 target nucleic acids are NOT DETECTED. The SARS-CoV-2 RNA is generally detectable in upper and lower  respiratory specimens during the acute phase of infection. The lowest  concentration of SARS-CoV-2 viral copies this assay can detect is 250  copies / mL. A negative result does not preclude SARS-CoV-2 infection  and should not be used as the sole basis for treatment or other  patient management decisions.  A  negative result may occur with  improper specimen collection / handling, submission of specimen other  than nasopharyngeal swab, presence of viral mutation(s) within the  areas targeted by this assay, and inadequate number of viral copies  (<250 copies / mL). A negative result must be combined with clinical  observations, patient history, and epidemiological information. If result is POSITIVE SARS-CoV-2 target nucleic acids are DETECTED. The SARS-CoV-2 RNA is generally detectable in upper and lower  respiratory specimens dur ing the acute phase of infection.  Positive  results are indicative of active infection with SARS-CoV-2.  Clinical  correlation with patient history and other diagnostic information is  necessary to determine patient infection status.  Positive results do  not rule out bacterial infection or co-infection with other viruses. If result is PRESUMPTIVE POSTIVE SARS-CoV-2 nucleic acids MAY BE PRESENT.   A presumptive positive result was obtained on the submitted specimen  and confirmed on repeat testing.  While 2019 novel  coronavirus  (SARS-CoV-2) nucleic acids may be present in the submitted sample  additional confirmatory testing may be necessary for epidemiological  and / or clinical management purposes  to differentiate between  SARS-CoV-2 and other Sarbecovirus currently known to infect humans.  If clinically indicated additional testing with an alternate test  methodology [phone]) is advised. The SARS-CoV-2 RNA is generally  detectable in upper and lower respiratory sp ecimens during the acute  phase of infection. The expected result is Negative. Fact Sheet for Patients:  StrictlyIdeas.no Fact Sheet for Healthcare Providers: BankingDealers.co.za This test is not yet approved or cleared by the [place] FDA and has been authorized for detection and/or diagnosis of SARS-CoV-2 by FDA under an Emergency Use  Authorization (EUA).  This EUA will remain in effect (meaning this test can be used) for the duration of the COVID-19 declaration under Section 564(b)(1) of the Act, 21 U.S.C. section 360bbb-3(b)(1), unless the authorization is terminated or revoked sooner. Performed at [facility], [place] [address], [place], [place] [address]       Radiology Studies: Ct Abdomen Pelvis Wo Contrast  Result Date: 08/08/2018 CLINICAL DATA:  Nausea and vomiting with abdominal swelling. EXAM: CT ABDOMEN AND PELVIS WITHOUT CONTRAST TECHNIQUE: Multidetector CT imaging of the abdomen and pelvis was performed following the standard protocol without IV contrast. COMPARISON:  09/27/2013 FINDINGS: Lower chest:  No contributory findings. Hepatobiliary: No focal liver abnormality.No evidence of biliary obstruction or stone. Pancreas: Unremarkable. Spleen: Unremarkable. Adrenals/Urinary Tract: Negative adrenals. No hydronephrosis or stone. Unremarkable bladder. Stomach/Bowel: High-grade small bowel obstruction with dilated fluid-filled loops and mesenteric edema. Bowel loops proximal and distal to the obstructed segments are collapsed and this may reflect a closed loop obstruction. A transition is seen at the level of a large periumbilical hernia which contains distended loops and ascitic fluid. Note that the patient's peritoneal catheter traverses this hernia sac. Bowel sutures in the right upper quadrant. There is history of perforated appendix surgery. Vascular/Lymphatic: Diffuse arterial calcification. No mass or adenopathy. Reproductive:Negative Other: Small volume ascites/dialysate. Tenckhoff catheter tip is in the anterior left lower quadrant Musculoskeletal: No acute finding IMPRESSION: High-grade small bowel obstruction with a transition point seen at a large periumbilical hernia. The patient's Tenckhoff catheter traverses the hernia sac, which may limit reduction. Proximal small bowel loops are also collapsed, there  may be a closed loop obstruction. Electronically Signed   By: Monte Fantasia M.D.   On: 08/08/2018 05:01   Ir Fluoro Guide Cv Line Right  Result Date: 08/08/2018 INDICATION: 48 year old male with a history renal failure EXAM: IMAGE GUIDED PLACEMENT OF TUNNELED HEMODIALYSIS CATHETER MEDICATIONS: 2 G ANCEF; The antibiotic was administered within an appropriate time interval prior to skin puncture. ANESTHESIA/SEDATION: Moderate (conscious) sedation was employed during this procedure. A total of Versed 2.0 mg and Fentanyl 100 mcg was administered intravenously. Moderate Sedation Time: 13 minutes. The patient's level of consciousness and vital signs were monitored continuously by radiology nursing throughout the procedure under my direct supervision. FLUOROSCOPY TIME:  Fluoroscopy Time: 0 minutes 6 seconds (1 mGy). COMPLICATIONS: None PROCEDURE: Informed written consent was obtained from the patient after a discussion of the risks, benefits, and alternatives to treatment. Questions regarding the procedure were encouraged and answered. The right neck and chest were prepped with chlorhexidine in a sterile fashion, and a sterile drape was applied covering the operative field. Maximum barrier sterile technique with sterile gowns and gloves were used for the procedure. A timeout was performed prior to the initiation  of the procedure. After creating a small venotomy incision, a micropuncture kit was utilized to access the right internal jugular vein under direct, real-time ultrasound guidance after the overlying soft tissues were anesthetized with 1% lidocaine with epinephrine. Ultrasound image documentation was performed. The microwire was marked to measure appropriate internal catheter length. External tunneled length was estimated. A total tip to cuff length of 19 cm was selected. Skin and subcutaneous tissues of chest wall below the clavicle were generously infiltrated with 1% lidocaine for local anesthesia. A small  stab incision was made with 11 blade scalpel. The selected hemodialysis catheter was tunneled in a retrograde fashion from the anterior chest wall to the venotomy incision. A guidewire was advanced to the level of the IVC and the micropuncture sheath was exchanged for a peel-away sheath. The catheter was then placed through the peel-away sheath with tips ultimately positioned within the superior aspect of the right atrium. Final catheter positioning was confirmed and documented with a spot radiographic image. The catheter aspirates and flushes normally. The catheter was flushed with appropriate volume heparin dwells. The catheter exit site was secured with a 0-Prolene retention suture. The venotomy incision was closed Derma bond and sterile dressing. Dressings were applied at the chest wall. Patient tolerated the procedure well and remained hemodynamically stable throughout. No complications were encountered and no significant blood loss encountered. IMPRESSION: Status post right IJ tunneled hemodialysis catheter placement. Catheter ready for use. Signed, Dulcy Fanny. Dellia Nims, RPVI [facility] Inter[facility] Radiology Specialists [facility] Radiology Electronically Signed   By: Corrie Mckusick D.O.   On: 08/08/2018 17:05   Ir US Guide Vasc Access Right  Result Date: 08/08/2018 INDICATION: 48 year old male with a history renal failure EXAM: IMAGE GUIDED PLACEMENT OF TUNNELED HEMODIALYSIS CATHETER MEDICATIONS: 2 G ANCEF; The antibiotic was administered within an appropriate time interval prior to skin puncture. ANESTHESIA/SEDATION: Moderate (conscious) sedation was employed during this procedure. A total of Versed 2.0 mg and Fentanyl 100 mcg was administered intravenously. Moderate Sedation Time: 13 minutes. The patient's level of consciousness and vital signs were monitored continuously by radiology nursing throughout the procedure under my direct supervision. FLUOROSCOPY TIME:  Fluoroscopy Time: 0 minutes 6  seconds (1 mGy). COMPLICATIONS: None PROCEDURE: Informed written consent was obtained from the patient after a discussion of the risks, benefits, and alternatives to treatment. Questions regarding the procedure were encouraged and answered. The right neck and chest were prepped with chlorhexidine in a sterile fashion, and a sterile drape was applied covering the operative field. Maximum barrier sterile technique with sterile gowns and gloves were used for the procedure. A timeout was performed prior to the initiation of the procedure. After creating a small venotomy incision, a micropuncture kit was utilized to access the right internal jugular vein under direct, real-time ultrasound guidance after the overlying soft tissues were anesthetized with 1% lidocaine with epinephrine. Ultrasound image documentation was performed. The microwire was marked to measure appropriate internal catheter length. External tunneled length was estimated. A total tip to cuff length of 19 cm was selected. Skin and subcutaneous tissues of chest wall below the clavicle were generously infiltrated with 1% lidocaine for local anesthesia. A small stab incision was made with 11 blade scalpel. The selected hemodialysis catheter was tunneled in a retrograde fashion from the anterior chest wall to the venotomy incision. A guidewire was advanced to the level of the IVC and the micropuncture sheath was exchanged for a peel-away sheath. The catheter was then placed through the peel-away sheath  with tips ultimately positioned within the superior aspect of the right atrium. Final catheter positioning was confirmed and documented with a spot radiographic image. The catheter aspirates and flushes normally. The catheter was flushed with appropriate volume heparin dwells. The catheter exit site was secured with a 0-Prolene retention suture. The venotomy incision was closed Derma bond and sterile dressing. Dressings were applied at the chest wall. Patient  tolerated the procedure well and remained hemodynamically stable throughout. No complications were encountered and no significant blood loss encountered. IMPRESSION: Status post right IJ tunneled hemodialysis catheter placement. Catheter ready for use. Signed, Dulcy Fanny. Dellia Nims, RPVI [facility] Inter[facility] Radiology Specialists [facility] Radiology Electronically Signed   By: Corrie Mckusick D.O.   On: 08/08/2018 17:05   Dg Abd Portable 1v  Result Date: 08/08/2018 CLINICAL DATA:  Nasogastric tube placement EXAM: PORTABLE ABDOMEN - 1 VIEW COMPARISON:  Abdominal CT earlier today FINDINGS: Nasogastric tube is not seen over the abdomen or lower chest. Known small-bowel obstruction and Tenckhoff catheter. IMPRESSION: No nasogastric tube visualized over the abdomen or lower chest. Electronically Signed   By: Monte Fantasia M.D.   On: 08/08/2018 06:37     Scheduled Meds:  Chlorhexidine Gluconate Cloth  6 each Topical Q0600   insulin aspart  0-15 Units Subcutaneous Q4H   insulin glargine  5 Units Subcutaneous QHS   metoprolol tartrate  2.5 mg Intravenous Q6H   pantoprazole (PROTONIX) IV  40 mg Intravenous Q24H   Continuous Infusions:  sodium chloride 50 mL/hr at 08/09/18 1426     LOS: 1 day    Time spent: Total of 35 minutes spent with pt, greater than 50% of which was spent in discussion of  treatment, counseling and coordination of care   Chipper Oman, MD  How to contact the [facility] Attending or Consulting provider [place] or covering provider during after hours [place], for this patient?  1. Check the care team in [facility] and look for a) attending/consulting [facility] provider listed and b) the [facility] team listed 2. Log into [email or web address] and use [place]'s universal password to access. If you do not have the password, please contact the hospital operator. 3. Locate the [facility] provider you are looking for under [facility] and page to a number that you can be directly reached. 4. If you  still have difficulty reaching the provider, please page the [facility] (Director on Call) for the Hospitalists listed on amion for assistance.

## 2018-08-10 ENCOUNTER — Encounter (HOSPITAL_COMMUNITY): Payer: Self-pay

## 2018-08-10 LAB — HEPATITIS B SURFACE ANTIBODY,QUALITATIVE: Hep B S Ab: NONREACTIVE

## 2018-08-10 LAB — CBC
HCT: 27.1 % — ABNORMAL LOW (ref 39.0–52.0)
Hemoglobin: 8.9 g/dL — ABNORMAL LOW (ref 13.0–17.0)
MCH: 30.7 pg (ref 26.0–34.0)
MCHC: 32.8 g/dL (ref 30.0–36.0)
MCV: 93.4 fL (ref 80.0–100.0)
Platelets: 133 10*3/uL — ABNORMAL LOW (ref 150–400)
RBC: 2.9 MIL/uL — ABNORMAL LOW (ref 4.22–5.81)
RDW: 12.2 % (ref 11.5–15.5)
WBC: 7.4 10*3/uL (ref 4.0–10.5)
nRBC: 0 % (ref 0.0–0.2)

## 2018-08-10 LAB — BASIC METABOLIC PANEL
Anion gap: 12 (ref 5–15)
BUN: 50 mg/dL — ABNORMAL HIGH (ref 6–20)
CO2: 28 mmol/L (ref 22–32)
Calcium: 8.1 mg/dL — ABNORMAL LOW (ref 8.9–10.3)
Chloride: 98 mmol/L (ref 98–111)
Creatinine, Ser: 9.63 mg/dL — ABNORMAL HIGH (ref 0.61–1.24)
GFR calc Af Amer: 7 mL/min — ABNORMAL LOW (ref >60)
GFR calc non Af Amer: 6 mL/min — ABNORMAL LOW (ref >60)
Glucose, Bld: 142 mg/dL — ABNORMAL HIGH (ref 70–99)
Potassium: 3.7 mmol/L (ref 3.5–5.1)
Sodium: 138 mmol/L (ref 135–145)

## 2018-08-10 LAB — GLUCOSE, CAPILLARY
Glucose-Capillary: 116 mg/dL — ABNORMAL HIGH (ref 70–99)
Glucose-Capillary: 121 mg/dL — ABNORMAL HIGH (ref 70–99)
Glucose-Capillary: 127 mg/dL — ABNORMAL HIGH (ref 70–99)
Glucose-Capillary: 130 mg/dL — ABNORMAL HIGH (ref 70–99)
Glucose-Capillary: 134 mg/dL — ABNORMAL HIGH (ref 70–99)
Glucose-Capillary: 134 mg/dL — ABNORMAL HIGH (ref 70–99)

## 2018-08-10 LAB — PREALBUMIN: Prealbumin: 24.6 mg/dL (ref 18–38)

## 2018-08-10 LAB — HEPATITIS B CORE ANTIBODY, TOTAL: Hep B Core Total Ab: NEGATIVE

## 2018-08-10 LAB — MRSA PCR SCREENING: MRSA by PCR: NEGATIVE

## 2018-08-10 MED ORDER — CEFAZOLIN SODIUM-DEXTROSE 2-4 GM/100ML-% IV SOLN
2.0000 g | INTRAVENOUS | Status: AC
  Administered 2018-08-11: 2 g via INTRAVENOUS
  Filled 2018-08-10: qty 100

## 2018-08-10 MED ORDER — CEFAZOLIN (ANCEF) 1 G IV SOLR: 2.0000 g | INTRAVENOUS | Status: DC

## 2018-08-10 MED ORDER — HEPARIN SODIUM (PORCINE) 5000 UNIT/ML IJ SOLN
5000.0000 [IU] | INTRAMUSCULAR | Status: AC
  Administered 2018-08-11: 5000 [IU] via SUBCUTANEOUS
  Filled 2018-08-10: qty 1

## 2018-08-10 NOTE — Progress Notes (Signed)
[facility] Progress Note  Subjective:  Has been walking in halls, no c/o. 3hr HD yest, K and B/Cr good today.   Vitals:   08/10/18 0404 08/10/18 0500 08/10/18 0744 08/10/18 1433  BP: 132/67  127/70 (!) 141/75  Pulse: 90  88 87  Resp: 16  14 16   Temp: 99.8 F (37.7 C)  99.1 F (37.3 C) 98.1 F (36.7 C)  TempSrc: Oral  Oral Oral  SpO2: 100%  99% 100%  Weight:  85.4 kg      Inpatient medications: . Chlorhexidine Gluconate Cloth  6 each Topical Q0600  . insulin aspart  0-15 Units Subcutaneous Q4H  . insulin glargine  5 Units Subcutaneous QHS  . metoprolol tartrate  2.5 mg Intravenous Q6H  . pantoprazole (PROTONIX) IV  40 mg Intravenous Q24H   . sodium chloride 50 mL/hr at 08/10/18 0944   acetaminophen **OR** acetaminophen, hydrALAZINE, menthol-cetylpyridinium, morphine injection, ondansetron **OR** ondansetron (ZOFRAN) IV, phenol    Exam: Gen alert, no distress +NG tube to suction  No jvd or bruits Chest clear bilat to bases RRR no MRG Abd firm, dec'd BS, small R periumb hernia palpated, no rebound, old healed scars, RLQ PD cath intact GU normal male MS no joint effusions or deformity Ext no LE edema Neuro is alert, Ox 3 , nf    Home meds:  - amlodipine 10 / clonidine 0.1 tid/ irbesartan 300 qd/ metoprolol xl 25 qd  - insulin aspart SSI tid 0- 10u  - furosemide 80 bid/ sevelamer carb ac tid    Outpt PD: 4 overnight, 2.3 L fill, 1.5 h dwell, no day bag or pause - got venofer 200mg  6/22 and mircera 75 ug 6/22   Assessment/ Plan: 1. Incarb umbilical hernia w/ SBO: for hernia repair surgery, scheduled tentative for tomorrow. Will need to hold PD for now until abdomen is healed up from surgery.  2. ESRD: usually CCPD, HD now due to #1 above. Has new TDC , got HD yest, plan next HD on Tuesday. 3. Volume - euvolemic on exam, GI losses > 1L per day per NG.  4. HTN - cont meds as needed 5. Anemia ckd - Hb 8.9, tranfuse prn, follow 6. [place] ckd - con  tmeds         [facility] 08/10/2018, 4:00 PM  Iron/TIBC/Ferritin/ %Sat    Component Value Date/Time   IRON 50 11/12/2017 1333   TIBC 249 (L) 11/12/2017 1333   IRONPCTSAT 20 11/12/2017 1333   Recent Labs  Lab 08/08/18 0631 08/09/18 0348 08/10/18 0456  NA  --  140 138  K  --  3.3* 3.7  CL  --  90* 98  CO2  --  32 28  GLUCOSE  --  137* 142*  BUN  --  89* 50*  CREATININE  --  12.47* 9.63*  CALCIUM  --  9.3 8.1*  ALBUMIN  --  2.9*  --   INR 1.0  --   --    Recent Labs  Lab 08/09/18 0348  AST 14*  ALT 16  ALKPHOS 54  BILITOT 0.7  PROT 5.4*   Recent Labs  Lab 08/10/18 0456  WBC 7.4  HGB 8.9*  HCT 27.1*  PLT 133*

## 2018-08-10 NOTE — Progress Notes (Signed)
[facility] Progress Note  Subjective: had new TDC yest per IR, is up on HD now  Vitals:   08/09/18 2330 08/10/18 0004 08/10/18 0404 08/10/18 0500  BP: 130/77  132/67   Pulse: 97  90   Resp:   16   Temp:   99.8 F (37.7 C)   TempSrc:   Oral   SpO2:   100%   Weight:  85.4 kg  85.4 kg    Inpatient medications: . Chlorhexidine Gluconate Cloth  6 each Topical Q0600  . insulin aspart  0-15 Units Subcutaneous Q4H  . insulin glargine  5 Units Subcutaneous QHS  . metoprolol tartrate  2.5 mg Intravenous Q6H  . pantoprazole (PROTONIX) IV  40 mg Intravenous Q24H   . sodium chloride 50 mL/hr at 08/09/18 1426   acetaminophen **OR** acetaminophen, hydrALAZINE, menthol-cetylpyridinium, morphine injection, ondansetron **OR** ondansetron (ZOFRAN) IV, phenol    Exam: Gen alert, no distress +NG tube to suction  No jvd or bruits Chest clear bilat to bases RRR no MRG Abd firm, dec'd BS, small R periumb hernia palpated, no rebound, old healed scars, RLQ PD cath intact GU normal male MS no joint effusions or deformity Ext no LE edema Neuro is alert, Ox 3 , nf    Home meds:  - amlodipine 10 / clonidine 0.1 tid/ irbesartan 300 qd/ metoprolol xl 25 qd  - insulin aspart SSI tid 0- 10u  - furosemide 80 bid/ sevelamer carb ac tid    Outpt PD: 4 overnight, 2.3 L fill, 1.5 h dwell, no day bag or pause - got venofer 200mg  6/22 and mircera 75 ug 6/22   Assessment/ Plan: 1. Incarb umbilical hernia w/ SBO: needs hernia repair surgery, so will transition to hemodialysis until abdomen heals up post-surgery.  2. ESRD: usually is on CCPD, as above will now need HD due to need for abd surgery. Plan HD today , then probably next on Monday 3. Volume - euvolemic on exam 4. HTN - cont meds as needed 5. Anemia ckd - Hb 9.8, follow 6. [place] ckd - con tmeds         [facility] 08/10/2018, 7:32 AM  Iron/TIBC/Ferritin/ %Sat    Component Value Date/Time    IRON 50 11/12/2017 1333   TIBC 249 (L) 11/12/2017 1333   IRONPCTSAT 20 11/12/2017 1333   Recent Labs  Lab 08/08/18 0631 08/09/18 0348 08/10/18 0456  NA  --  140 138  K  --  3.3* 3.7  CL  --  90* 98  CO2  --  32 28  GLUCOSE  --  137* 142*  BUN  --  89* 50*  CREATININE  --  12.47* 9.63*  CALCIUM  --  9.3 8.1*  ALBUMIN  --  2.9*  --   INR 1.0  --   --    Recent Labs  Lab 08/09/18 0348  AST 14*  ALT 16  ALKPHOS 54  BILITOT 0.7  PROT 5.4*   Recent Labs  Lab 08/10/18 0456  WBC 7.4  HGB 8.9*  HCT 27.1*  PLT 133*

## 2018-08-10 NOTE — H&P (View-Only) (Signed)
[facility] Progress Note     Subjective: CC: wants NGT out Patient wants to know when NGT comes out. Denies abdominal pain. +Flatus.   Objective: Vital signs in last 24 hours: Temp:  [98.9 F (37.2 C)-99.8 F (37.7 C)] 99.1 F (37.3 C) (07/26 0744) Pulse Rate:  [85-97] 88 (07/26 0744) Resp:  [14-18] 14 (07/26 0744) BP: (107-132)/(60-77) 127/70 (07/26 0744) SpO2:  [96 %-100 %] 99 % (07/26 0744) Weight:  [85.4 kg] 85.4 kg (07/26 0500) Last BM Date: 08/07/18  Intake/Output from previous day: 07/25 0701 - 07/26 0700 In: 0  Out: 1767 [Urine:400; Emesis/NG output:1400] Intake/Output this shift: No intake/output data recorded.  PE: Gen:  Alert, NAD Card:  Regular rate and rhythm Pulm:  Normal effort, clear to auscultation bilaterally Abd: Soft, non-tender, non-distended, +BS, incisional hernia noted with what feels like swiss cheese defects. Focally incarcerated umbilical hernia is able to be reduced and defect is palpable. The hernia just lateral to this is unable to be reduced. His PD catheter is palpable and feels just outside of the hernia.  Skin: warm and dry, no rashes  Psych: A&Ox3   Lab Results:  Recent Labs    08/09/18 0348 08/10/18 0456  WBC 9.5 7.4  HGB 9.8* 8.9*  HCT 29.3* 27.1*  PLT 165 133*   BMET Recent Labs    08/09/18 0348 08/10/18 0456  NA 140 138  K 3.3* 3.7  CL 90* 98  CO2 32 28  GLUCOSE 137* 142*  BUN 89* 50*  CREATININE 12.47* 9.63*  CALCIUM 9.3 8.1*   PT/INR Recent Labs    08/08/18 0631  LABPROT 12.7  INR 1.0   CMP     Component Value Date/Time   NA 138 08/10/2018 0456   K 3.7 08/10/2018 0456   CL 98 08/10/2018 0456   CO2 28 08/10/2018 0456   GLUCOSE 142 (H) 08/10/2018 0456   BUN 50 (H) 08/10/2018 0456   CREATININE 9.63 (H) 08/10/2018 0456   CALCIUM 8.1 (L) 08/10/2018 0456   PROT 5.4 (L) 08/09/2018 0348   ALBUMIN 2.9 (L) 08/09/2018 0348   AST 14 (L) 08/09/2018 0348   ALT 16 08/09/2018 0348   ALKPHOS 54  08/09/2018 0348   BILITOT 0.7 08/09/2018 0348   GFRNONAA 6 (L) 08/10/2018 0456   GFRAA 7 (L) 08/10/2018 0456   Lipase     Component Value Date/Time   LIPASE 27 08/07/2018 2336       Studies/Results: Ir Fluoro Guide Cv Line Right  Result Date: 08/08/2018 INDICATION: 48 year old male with a history renal failure EXAM: IMAGE GUIDED PLACEMENT OF TUNNELED HEMODIALYSIS CATHETER MEDICATIONS: 2 G ANCEF; The antibiotic was administered within an appropriate time interval prior to skin puncture. ANESTHESIA/SEDATION: Moderate (conscious) sedation was employed during this procedure. A total of Versed 2.0 mg and Fentanyl 100 mcg was administered intravenously. Moderate Sedation Time: 13 minutes. The patient's level of consciousness and vital signs were monitored continuously by radiology nursing throughout the procedure under my direct supervision. FLUOROSCOPY TIME:  Fluoroscopy Time: 0 minutes 6 seconds (1 mGy). COMPLICATIONS: None PROCEDURE: Informed written consent was obtained from the patient after a discussion of the risks, benefits, and alternatives to treatment. Questions regarding the procedure were encouraged and answered. The right neck and chest were prepped with chlorhexidine in a sterile fashion, and a sterile drape was applied covering the operative field. Maximum barrier sterile technique with sterile gowns and gloves were used for the procedure. A timeout was performed prior to the  initiation of the procedure. After creating a small venotomy incision, a micropuncture kit was utilized to access the right internal jugular vein under direct, real-time ultrasound guidance after the overlying soft tissues were anesthetized with 1% lidocaine with epinephrine. Ultrasound image documentation was performed. The microwire was marked to measure appropriate internal catheter length. External tunneled length was estimated. A total tip to cuff length of 19 cm was selected. Skin and subcutaneous tissues of  chest wall below the clavicle were generously infiltrated with 1% lidocaine for local anesthesia. A small stab incision was made with 11 blade scalpel. The selected hemodialysis catheter was tunneled in a retrograde fashion from the anterior chest wall to the venotomy incision. A guidewire was advanced to the level of the IVC and the micropuncture sheath was exchanged for a peel-away sheath. The catheter was then placed through the peel-away sheath with tips ultimately positioned within the superior aspect of the right atrium. Final catheter positioning was confirmed and documented with a spot radiographic image. The catheter aspirates and flushes normally. The catheter was flushed with appropriate volume heparin dwells. The catheter exit site was secured with a 0-Prolene retention suture. The venotomy incision was closed Derma bond and sterile dressing. Dressings were applied at the chest wall. Patient tolerated the procedure well and remained hemodynamically stable throughout. No complications were encountered and no significant blood loss encountered. IMPRESSION: Status post right IJ tunneled hemodialysis catheter placement. Catheter ready for use. Signed, Dulcy Fanny. Dellia Nims, RPVI [facility] Electronically Signed   By: Corrie Mckusick D.O.   On: 08/08/2018 17:05   Ir US Guide Vasc Access Right  Result Date: 08/08/2018 INDICATION: 48 year old male with a history renal failure EXAM: IMAGE GUIDED PLACEMENT OF TUNNELED HEMODIALYSIS CATHETER MEDICATIONS: 2 G ANCEF; The antibiotic was administered within an appropriate time interval prior to skin puncture. ANESTHESIA/SEDATION: Moderate (conscious) sedation was employed during this procedure. A total of Versed 2.0 mg and Fentanyl 100 mcg was administered intravenously. Moderate Sedation Time: 13 minutes. The patient's level of consciousness and vital signs were monitored continuously by radiology nursing  throughout the procedure under my direct supervision. FLUOROSCOPY TIME:  Fluoroscopy Time: 0 minutes 6 seconds (1 mGy). COMPLICATIONS: None PROCEDURE: Informed written consent was obtained from the patient after a discussion of the risks, benefits, and alternatives to treatment. Questions regarding the procedure were encouraged and answered. The right neck and chest were prepped with chlorhexidine in a sterile fashion, and a sterile drape was applied covering the operative field. Maximum barrier sterile technique with sterile gowns and gloves were used for the procedure. A timeout was performed prior to the initiation of the procedure. After creating a small venotomy incision, a micropuncture kit was utilized to access the right internal jugular vein under direct, real-time ultrasound guidance after the overlying soft tissues were anesthetized with 1% lidocaine with epinephrine. Ultrasound image documentation was performed. The microwire was marked to measure appropriate internal catheter length. External tunneled length was estimated. A total tip to cuff length of 19 cm was selected. Skin and subcutaneous tissues of chest wall below the clavicle were generously infiltrated with 1% lidocaine for local anesthesia. A small stab incision was made with 11 blade scalpel. The selected hemodialysis catheter was tunneled in a retrograde fashion from the anterior chest wall to the venotomy incision. A guidewire was advanced to the level of the IVC and the micropuncture sheath was exchanged for a peel-away sheath. The catheter was then placed through the peel-away  sheath with tips ultimately positioned within the superior aspect of the right atrium. Final catheter positioning was confirmed and documented with a spot radiographic image. The catheter aspirates and flushes normally. The catheter was flushed with appropriate volume heparin dwells. The catheter exit site was secured with a 0-Prolene retention suture. The venotomy  incision was closed Derma bond and sterile dressing. Dressings were applied at the chest wall. Patient tolerated the procedure well and remained hemodynamically stable throughout. No complications were encountered and no significant blood loss encountered. IMPRESSION: Status post right IJ tunneled hemodialysis catheter placement. Catheter ready for use. Signed, Dulcy Fanny. Dellia Nims, RPVI [facility] Electronically Signed   By: Corrie Mckusick D.O.   On: 08/08/2018 17:05    Anti-infectives: Anti-infectives (From admission, onward)   Start     Dose/Rate Route Frequency Ordered Stop   08/08/18 1645  ceFAZolin (ANCEF) IVPB 2g/100 mL premix     2 g 200 mL/hr over 30 Minutes Intravenous  Once 08/08/18 1632 08/08/18 1702   08/08/18 1620  ceFAZolin (ANCEF) 2-4 GM/100ML-% IVPB    Note to Pharmacy: Desiree Hane   : cabinet override      08/08/18 1620 08/09/18 0429       Assessment/Plan Type 1 DM HTN ESRD on PD  SBO secondary to incarcerated incisional hernia - NGT with 1400 out, less bloody appearing today, continue on LIWS - mobilize as able - ok to have some ice chips for comfort - OR tentatively planned for 7/27 - prealbumin 24.6  FEN -NPO, NGT, IVFs VTE -SCDs ID -none currently  LOS: 2 days    Brigid Re , [facility] 08/10/2018, 9:40 AM Pager: [place]: [phone]

## 2018-08-10 NOTE — Progress Notes (Signed)
[facility] Progress Note     Subjective: CC: wants NGT out Patient wants to know when NGT comes out. Denies abdominal pain. +Flatus.   Objective: Vital signs in last 24 hours: Temp:  [98.9 F (37.2 C)-99.8 F (37.7 C)] 99.1 F (37.3 C) (07/26 0744) Pulse Rate:  [85-97] 88 (07/26 0744) Resp:  [14-18] 14 (07/26 0744) BP: (107-132)/(60-77) 127/70 (07/26 0744) SpO2:  [96 %-100 %] 99 % (07/26 0744) Weight:  [85.4 kg] 85.4 kg (07/26 0500) Last BM Date: 08/07/18  Intake/Output from previous day: 07/25 0701 - 07/26 0700 In: 0  Out: 1767 [Urine:400; Emesis/NG output:1400] Intake/Output this shift: No intake/output data recorded.  PE: Gen:  Alert, NAD Card:  Regular rate and rhythm Pulm:  Normal effort, clear to auscultation bilaterally Abd: Soft, non-tender, non-distended, +BS, incisional hernia noted with what feels like swiss cheese defects. Focally incarcerated umbilical hernia is able to be reduced and defect is palpable. The hernia just lateral to this is unable to be reduced. His PD catheter is palpable and feels just outside of the hernia.  Skin: warm and dry, no rashes  Psych: A&Ox3   Lab Results:  Recent Labs    08/09/18 0348 08/10/18 0456  WBC 9.5 7.4  HGB 9.8* 8.9*  HCT 29.3* 27.1*  PLT 165 133*   BMET Recent Labs    08/09/18 0348 08/10/18 0456  NA 140 138  K 3.3* 3.7  CL 90* 98  CO2 32 28  GLUCOSE 137* 142*  BUN 89* 50*  CREATININE 12.47* 9.63*  CALCIUM 9.3 8.1*   PT/INR Recent Labs    08/08/18 0631  LABPROT 12.7  INR 1.0   CMP     Component Value Date/Time   NA 138 08/10/2018 0456   K 3.7 08/10/2018 0456   CL 98 08/10/2018 0456   CO2 28 08/10/2018 0456   GLUCOSE 142 (H) 08/10/2018 0456   BUN 50 (H) 08/10/2018 0456   CREATININE 9.63 (H) 08/10/2018 0456   CALCIUM 8.1 (L) 08/10/2018 0456   PROT 5.4 (L) 08/09/2018 0348   ALBUMIN 2.9 (L) 08/09/2018 0348   AST 14 (L) 08/09/2018 0348   ALT 16 08/09/2018 0348   ALKPHOS 54  08/09/2018 0348   BILITOT 0.7 08/09/2018 0348   GFRNONAA 6 (L) 08/10/2018 0456   GFRAA 7 (L) 08/10/2018 0456   Lipase     Component Value Date/Time   LIPASE 27 08/07/2018 2336       Studies/Results: Ir Fluoro Guide Cv Line Right  Result Date: 08/08/2018 INDICATION: 48-year-old male with a history renal failure EXAM: IMAGE GUIDED PLACEMENT OF TUNNELED HEMODIALYSIS CATHETER MEDICATIONS: 2 G ANCEF; The antibiotic was administered within an appropriate time interval prior to skin puncture. ANESTHESIA/SEDATION: Moderate (conscious) sedation was employed during this procedure. A total of Versed 2.0 mg and Fentanyl 100 mcg was administered intravenously. Moderate Sedation Time: 13 minutes. The patient's level of consciousness and vital signs were monitored continuously by radiology nursing throughout the procedure under my direct supervision. FLUOROSCOPY TIME:  Fluoroscopy Time: 0 minutes 6 seconds (1 mGy). COMPLICATIONS: None PROCEDURE: Informed written consent was obtained from the patient after a discussion of the risks, benefits, and alternatives to treatment. Questions regarding the procedure were encouraged and answered. The right neck and chest were prepped with chlorhexidine in a sterile fashion, and a sterile drape was applied covering the operative field. Maximum barrier sterile technique with sterile gowns and gloves were used for the procedure. A timeout was performed prior to the   initiation of the procedure. After creating a small venotomy incision, a micropuncture kit was utilized to access the right internal jugular vein under direct, real-time ultrasound guidance after the overlying soft tissues were anesthetized with 1% lidocaine with epinephrine. Ultrasound image documentation was performed. The microwire was marked to measure appropriate internal catheter length. External tunneled length was estimated. A total tip to cuff length of 19 cm was selected. Skin and subcutaneous tissues of  chest wall below the clavicle were generously infiltrated with 1% lidocaine for local anesthesia. A small stab incision was made with 11 blade scalpel. The selected hemodialysis catheter was tunneled in a retrograde fashion from the anterior chest wall to the venotomy incision. A guidewire was advanced to the level of the IVC and the micropuncture sheath was exchanged for a peel-away sheath. The catheter was then placed through the peel-away sheath with tips ultimately positioned within the superior aspect of the right atrium. Final catheter positioning was confirmed and documented with a spot radiographic image. The catheter aspirates and flushes normally. The catheter was flushed with appropriate volume heparin dwells. The catheter exit site was secured with a 0-Prolene retention suture. The venotomy incision was closed Derma bond and sterile dressing. Dressings were applied at the chest wall. Patient tolerated the procedure well and remained hemodynamically stable throughout. No complications were encountered and no significant blood loss encountered. IMPRESSION: Status post right IJ tunneled hemodialysis catheter placement. Catheter ready for use. Signed, Jaime S. Wagner, DO, RPVI [facility] [place] [facility] Electronically Signed   By: Jaime  Wagner D.O.   On: 08/08/2018 17:05   Ir Us Guide Vasc Access Right  Result Date: 08/08/2018 INDICATION: 48-year-old male with a history renal failure EXAM: IMAGE GUIDED PLACEMENT OF TUNNELED HEMODIALYSIS CATHETER MEDICATIONS: 2 G ANCEF; The antibiotic was administered within an appropriate time interval prior to skin puncture. ANESTHESIA/SEDATION: Moderate (conscious) sedation was employed during this procedure. A total of Versed 2.0 mg and Fentanyl 100 mcg was administered intravenously. Moderate Sedation Time: 13 minutes. The patient's level of consciousness and vital signs were monitored continuously by radiology nursing  throughout the procedure under my direct supervision. FLUOROSCOPY TIME:  Fluoroscopy Time: 0 minutes 6 seconds (1 mGy). COMPLICATIONS: None PROCEDURE: Informed written consent was obtained from the patient after a discussion of the risks, benefits, and alternatives to treatment. Questions regarding the procedure were encouraged and answered. The right neck and chest were prepped with chlorhexidine in a sterile fashion, and a sterile drape was applied covering the operative field. Maximum barrier sterile technique with sterile gowns and gloves were used for the procedure. A timeout was performed prior to the initiation of the procedure. After creating a small venotomy incision, a micropuncture kit was utilized to access the right internal jugular vein under direct, real-time ultrasound guidance after the overlying soft tissues were anesthetized with 1% lidocaine with epinephrine. Ultrasound image documentation was performed. The microwire was marked to measure appropriate internal catheter length. External tunneled length was estimated. A total tip to cuff length of 19 cm was selected. Skin and subcutaneous tissues of chest wall below the clavicle were generously infiltrated with 1% lidocaine for local anesthesia. A small stab incision was made with 11 blade scalpel. The selected hemodialysis catheter was tunneled in a retrograde fashion from the anterior chest wall to the venotomy incision. A guidewire was advanced to the level of the IVC and the micropuncture sheath was exchanged for a peel-away sheath. The catheter was then placed through the peel-away   sheath with tips ultimately positioned within the superior aspect of the right atrium. Final catheter positioning was confirmed and documented with a spot radiographic image. The catheter aspirates and flushes normally. The catheter was flushed with appropriate volume heparin dwells. The catheter exit site was secured with a 0-Prolene retention suture. The venotomy  incision was closed Derma bond and sterile dressing. Dressings were applied at the chest wall. Patient tolerated the procedure well and remained hemodynamically stable throughout. No complications were encountered and no significant blood loss encountered. IMPRESSION: Status post right IJ tunneled hemodialysis catheter placement. Catheter ready for use. Signed, Jaime S. Wagner, DO, RPVI [facility] [place] [facility] Electronically Signed   By: Jaime  Wagner D.O.   On: 08/08/2018 17:05    Anti-infectives: Anti-infectives (From admission, onward)   Start     Dose/Rate Route Frequency Ordered Stop   08/08/18 1645  ceFAZolin (ANCEF) IVPB 2g/100 mL premix     2 g 200 mL/hr over 30 Minutes Intravenous  Once 08/08/18 1632 08/08/18 1702   08/08/18 1620  ceFAZolin (ANCEF) 2-4 GM/100ML-% IVPB    Note to Pharmacy: [place], [place]   : cabinet override      08/08/18 1620 08/09/18 0429       Assessment/Plan Type 1 DM HTN ESRD on PD  SBO secondary to incarcerated incisional hernia - NGT with 1400 out, less bloody appearing today, continue on LIWS - mobilize as able - ok to have some ice chips for comfort - OR tentatively planned for 7/27 - prealbumin 24.6  FEN -NPO, NGT, IVFs VTE -SCDs ID -none currently  LOS: 2 days    Kelly Rayburn , PA-C [facility] 08/10/2018, 9:40 AM Pager: [phone] Consults: [phone]  

## 2018-08-10 NOTE — Plan of Care (Signed)
  Problem: Education: Goal: Knowledge of General Education information will improve Description: Including pain rating scale, medication(s)/side effects and non-pharmacologic comfort measures Outcome: Progressing   Problem: Clinical Measurements: Goal: Will remain free from infection Outcome: Progressing   Problem: Activity: Goal: Risk for activity intolerance will decrease Outcome: Progressing    Problem: Skin Integrity: Goal: Risk for impaired skin integrity will decrease Outcome: Progressing   Problem: Pain Managment: Goal: General experience of comfort will improve Outcome: Progressing   Problem: Safety: Goal: Ability to remain free from injury will improve Outcome: Progressing

## 2018-08-10 NOTE — Progress Notes (Signed)
1400 Pt was ambulating in the hall, 2 laps around the unit. No Nausea, no vomiting noted.

## 2018-08-10 NOTE — Progress Notes (Signed)
PROGRESS NOTE [facility]   JAVIUS SYLLA   ZMO:[id number] DOB: 08-Nov-1970  DOA: 08/07/2018 PCP: Haywood Pao, MD   Brief Narrative:  Alex Morrison is a 48 year old male with past medical history of diabetes mellitus type 1, hypertension, end-stage renal disease on peritoneal dialysis.  Patient presented to the ED for evaluation of abdominal pain.  Patient was not noted to have a large abdominal hernia, upon ED evaluation CT of the abdomen showed high-grade SBO involving PD catheter.  Patient admitted with working diagnosis of SBO and hemodialysis  Subjective: Patient seen and examined  No acute complaint.  Passing gas.  No BM.  No nausea no vomiting.  NG tube in place.  Abdominal pain is settled.  Assessment & Plan:   Principal Problem:   Abdominal hernia as complication of peritoneal dialysis Active Problems:   Type 1 diabetes mellitus (HCC)   ESRD (end stage renal disease) (HCC)   HTN (hypertension)   SBO (small bowel obstruction) (HCC)   Periumbilical hernia   Incarcerated periumbilical hernia/high-grade SBO General surgery recommendations appreciated, for now we will continue NG tube, and ambulation.  They will plan to have repair possibly early this week, given PD catheter.  Continue management per GS.  Continue antibiotics for now.  End-stage renal disease on CCPD Renal recommendations appreciated, On peritoneal dialysis at home. Given the presentation with high-grade SBO requiring surgical correction office incisional hernia patient will not be able to do PD until the wound heals. Therefore patient will transition to HD. HD catheter placed.  Diabetes mellitus type 1, controlled with renal complication Stable, continue home Lantus and sliding scale.  Patient n.p.o. monitor glucose closely and avoid hypoglycemia.  Hypertension BP stable, continue amlodipine and metoprolol.  Holding Lasix and ibesartan given that was awaiting for hemodialysis.  Resume  when if nephrology agrees.  DVT prophylaxis: SCDs Code Status: Full code Family Communication: None at bedside Disposition Plan: Pending improvement   Consultants:   Surgery  Nephrology  IR  Procedures:   HD catheter placement 7/24  Antimicrobials: Anti-infectives (From admission, onward)   Start     Dose/Rate Route Frequency Ordered Stop   08/08/18 1645  ceFAZolin (ANCEF) IVPB 2g/100 mL premix     2 g 200 mL/hr over 30 Minutes Intravenous  Once 08/08/18 1632 08/08/18 1702   08/08/18 1620  ceFAZolin (ANCEF) 2-4 GM/100ML-% IVPB    Note to Pharmacy: Desiree Hane   : cabinet override      08/08/18 1620 08/09/18 0429         Objective: Vitals:   08/10/18 0500 08/10/18 0744 08/10/18 0800 08/10/18 1433  BP:  127/70  (!) 141/75  Pulse:  88  87  Resp:  14  16  Temp:  99.1 F (37.3 C)  98.1 F (36.7 C)  TempSrc:  Oral  Oral  SpO2:  99%  100%  Weight: 85.4 kg     Height:   5\' 11"  (1.803 m)     Intake/Output Summary (Last 24 hours) at 08/10/2018 1724 Last data filed at 08/10/2018 0523 Gross per 24 hour  Intake --  Output 1800 ml  Net -1800 ml   Filed Weights   08/09/18 0739 08/10/18 0004 08/10/18 0500  Weight: 86.8 kg 85.4 kg 85.4 kg    Examination:  General exam: Appears calm and comfortable  HEENT: AC/AT, PERRLA, OP moist and clear.  NG tube in place with brown secretions. Respiratory system: Clear to auscultation. No wheezes,crackle or rhonchi Cardiovascular system: S1 &  S2 heard, RRR. No JVD, murmurs, rubs or gallops Gastrointestinal system: Abdomen soft mild distended, mild tender to palpation. Central nervous system: Alert and oriented.  Extremities: No pedal edema. Symmetric  Skin: No rashes, lesions or ulcers.   Data Reviewed: I have personally reviewed following labs and imaging studies  CBC: Recent Labs  Lab 08/07/18 2336 08/09/18 0348 08/10/18 0456  WBC 9.1 9.5 7.4  HGB 9.8* 9.8* 8.9*  HCT 28.5* 29.3* 27.1*  MCV 89.6 91.8 93.4   PLT 194 165 979*   Basic Metabolic Panel: Recent Labs  Lab 08/07/18 2336 08/09/18 0348 08/10/18 0456  NA 136 140 138  K 3.6 3.3* 3.7  CL 96* 90* 98  CO2 27 32 28  GLUCOSE 263* 137* 142*  BUN 75* 89* 50*  CREATININE 10.16* 12.47* 9.63*  CALCIUM 9.4 9.3 8.1*   GFR: Estimated Creatinine Clearance: 10 mL/min (A) (by C-G formula based on SCr of 9.63 mg/dL (H)). Liver Function Tests: Recent Labs  Lab 08/07/18 2336 08/09/18 0348  AST 17 14*  ALT 23 16  ALKPHOS 66 54  BILITOT 0.6 0.7  PROT 5.6* 5.4*  ALBUMIN 3.2* 2.9*   Recent Labs  Lab 08/07/18 2336  LIPASE 27   No results for input(s): AMMONIA in the last 168 hours. Coagulation Profile: Recent Labs  Lab 08/08/18 0631  INR 1.0   Cardiac Enzymes: No results for input(s): CKTOTAL, CKMB, CKMBINDEX, TROPONINI in the last 168 hours. BNP (last 3 results) No results for input(s): PROBNP in the last 8760 hours. HbA1C: Recent Labs    08/08/18 0540  HGBA1C 9.2*   CBG: Recent Labs  Lab 08/09/18 2359 08/10/18 0401 08/10/18 0724 08/10/18 1134 08/10/18 1539  GLUCAP 116* 130* 134* 121* 134*   Lipid Profile: No results for input(s): CHOL, HDL, LDLCALC, TRIG, CHOLHDL, LDLDIRECT in the last 72 hours. Thyroid Function Tests: No results for input(s): TSH, T4TOTAL, FREET4, T3FREE, THYROIDAB in the last 72 hours. Anemia Panel: No results for input(s): VITAMINB12, FOLATE, FERRITIN, TIBC, IRON, RETICCTPCT in the last 72 hours. Sepsis Labs: Recent Labs  Lab 08/08/18 0219  LATICACIDVEN 1.5    Recent Results (from the past 240 hour(s))  SARS Coronavirus 2 (CEPHEID - Performed in [facility] hospital lab), Hosp Order     Status: None   Collection Time: 08/08/18  5:12 AM   Specimen: Nasopharyngeal Swab  Result Value Ref Range Status   SARS Coronavirus 2 NEGATIVE NEGATIVE Final    Comment: (NOTE) If result is NEGATIVE SARS-CoV-2 target nucleic acids are NOT DETECTED. The SARS-CoV-2 RNA is generally detectable in  upper and lower  respiratory specimens during the acute phase of infection. The lowest  concentration of SARS-CoV-2 viral copies this assay can detect is 250  copies / mL. A negative result does not preclude SARS-CoV-2 infection  and should not be used as the sole basis for treatment or other  patient management decisions.  A negative result may occur with  improper specimen collection / handling, submission of specimen other  than nasopharyngeal swab, presence of viral mutation(s) within the  areas targeted by this assay, and inadequate number of viral copies  (<250 copies / mL). A negative result must be combined with clinical  observations, patient history, and epidemiological information. If result is POSITIVE SARS-CoV-2 target nucleic acids are DETECTED. The SARS-CoV-2 RNA is generally detectable in upper and lower  respiratory specimens dur ing the acute phase of infection.  Positive  results are indicative of active infection with SARS-CoV-2.  Clinical  correlation with patient history and other diagnostic information is  necessary to determine patient infection status.  Positive results do  not rule out bacterial infection or co-infection with other viruses. If result is PRESUMPTIVE POSTIVE SARS-CoV-2 nucleic acids MAY BE PRESENT.   A presumptive positive result was obtained on the submitted specimen  and confirmed on repeat testing.  While 2019 novel coronavirus  (SARS-CoV-2) nucleic acids may be present in the submitted sample  additional confirmatory testing may be necessary for epidemiological  and / or clinical management purposes  to differentiate between  SARS-CoV-2 and other Sarbecovirus currently known to infect humans.  If clinically indicated additional testing with an alternate test  methodology [phone]) is advised. The SARS-CoV-2 RNA is generally  detectable in upper and lower respiratory sp ecimens during the acute  phase of infection. The expected result is  Negative. Fact Sheet for Patients:  StrictlyIdeas.no Fact Sheet for Healthcare Providers: BankingDealers.co.za This test is not yet approved or cleared by the [place] FDA and has been authorized for detection and/or diagnosis of SARS-CoV-2 by FDA under an Emergency Use Authorization (EUA).  This EUA will remain in effect (meaning this test can be used) for the duration of the COVID-19 declaration under Section 564(b)(1) of the Act, 21 U.S.C. section 360bbb-3(b)(1), unless the authorization is terminated or revoked sooner. Performed at [facility], [place] [address], [place], [place] [address]       Radiology Studies: No results found.   Scheduled Meds:  Chlorhexidine Gluconate Cloth  6 each Topical Q0600   insulin aspart  0-15 Units Subcutaneous Q4H   insulin glargine  5 Units Subcutaneous QHS   metoprolol tartrate  2.5 mg Intravenous Q6H   pantoprazole (PROTONIX) IV  40 mg Intravenous Q24H   Continuous Infusions:  sodium chloride 50 mL/hr at 08/10/18 0944     LOS: 2 days    Time spent: Total of 35 minutes spent with pt, greater than 50% of which was spent in discussion of  treatment, counseling and coordination of care   Berle Mull, MD  How to contact the [facility] Attending or Consulting provider [place] or covering provider during after hours [place], for this patient?  1. Check the care team in [facility] and look for a) attending/consulting [facility] provider listed and b) the [facility] team listed 2. Log into [email or web address] and use [facility]'s universal password to access. If you do not have the password, please contact the hospital operator. 3. Locate the [facility] provider you are looking for under [facility] and page to a number that you can be directly reached. 4. If you still have difficulty reaching the provider, please page the [facility] (Director on Call) for the Hospitalists listed on amion for assistance.

## 2018-08-10 NOTE — Progress Notes (Signed)
Pt refused dressing change. Pt educated about allowing staff to change dressing as ordered to prevent infection.

## 2018-08-11 ENCOUNTER — Encounter (HOSPITAL_COMMUNITY)
Admission: EM | Disposition: A | Discharge status: Home / Self Care | Payer: Self-pay | Source: Home / Self Care | Attending: Internal Medicine

## 2018-08-11 ENCOUNTER — Inpatient Hospital Stay (HOSPITAL_COMMUNITY): Payer: BC Managed Care – PPO | Admitting: Certified Registered"

## 2018-08-11 HISTORY — PX: INCISIONAL HERNIA REPAIR: SHX193

## 2018-08-11 HISTORY — PX: LAPAROTOMY: SHX154

## 2018-08-11 HISTORY — PX: INSERTION OF MESH: SHX5868

## 2018-08-11 HISTORY — PX: LYSIS OF ADHESION: SHX5961

## 2018-08-11 LAB — CBC
HCT: 26.5 % — ABNORMAL LOW (ref 39.0–52.0)
Hemoglobin: 8.8 g/dL — ABNORMAL LOW (ref 13.0–17.0)
MCH: 30.7 pg (ref 26.0–34.0)
MCHC: 33.2 g/dL (ref 30.0–36.0)
MCV: 92.3 fL (ref 80.0–100.0)
Platelets: 132 10*3/uL — ABNORMAL LOW (ref 150–400)
RBC: 2.87 MIL/uL — ABNORMAL LOW (ref 4.22–5.81)
RDW: 12.2 % (ref 11.5–15.5)
WBC: 5.9 10*3/uL (ref 4.0–10.5)
nRBC: 0 % (ref 0.0–0.2)

## 2018-08-11 LAB — GLUCOSE, CAPILLARY
Glucose-Capillary: 106 mg/dL — ABNORMAL HIGH (ref 70–99)
Glucose-Capillary: 117 mg/dL — ABNORMAL HIGH (ref 70–99)
Glucose-Capillary: 118 mg/dL — ABNORMAL HIGH (ref 70–99)
Glucose-Capillary: 132 mg/dL — ABNORMAL HIGH (ref 70–99)
Glucose-Capillary: 133 mg/dL — ABNORMAL HIGH (ref 70–99)
Glucose-Capillary: 159 mg/dL — ABNORMAL HIGH (ref 70–99)
Glucose-Capillary: 174 mg/dL — ABNORMAL HIGH (ref 70–99)
Glucose-Capillary: 84 mg/dL (ref 70–99)
Glucose-Capillary: 87 mg/dL (ref 70–99)

## 2018-08-11 LAB — BASIC METABOLIC PANEL
Anion gap: 14 (ref 5–15)
BUN: 69 mg/dL — ABNORMAL HIGH (ref 6–20)
CO2: 27 mmol/L (ref 22–32)
Calcium: 8 mg/dL — ABNORMAL LOW (ref 8.9–10.3)
Chloride: 100 mmol/L (ref 98–111)
Creatinine, Ser: 11.23 mg/dL — ABNORMAL HIGH (ref 0.61–1.24)
GFR calc Af Amer: 6 mL/min — ABNORMAL LOW (ref >60)
GFR calc non Af Amer: 5 mL/min — ABNORMAL LOW (ref >60)
Glucose, Bld: 104 mg/dL — ABNORMAL HIGH (ref 70–99)
Potassium: 3.6 mmol/L (ref 3.5–5.1)
Sodium: 141 mmol/L (ref 135–145)

## 2018-08-11 LAB — TYPE AND SCREEN
ABO/RH(D): O POS
Antibody Screen: NEGATIVE

## 2018-08-11 LAB — ABO/RH: ABO/RH(D): O POS

## 2018-08-11 SURGERY — REPAIR, HERNIA, INCISIONAL
Anesthesia: General | Site: Abdomen

## 2018-08-11 MED ORDER — ONDANSETRON HCL 4 MG/2ML IJ SOLN
INTRAMUSCULAR | Status: DC | PRN
  Administered 2018-08-11: 4 mg via INTRAVENOUS

## 2018-08-11 MED ORDER — HYDROMORPHONE HCL 1 MG/ML IJ SOLN
INTRAMUSCULAR | Status: AC
  Filled 2018-08-11: qty 1

## 2018-08-11 MED ORDER — HEPARIN SODIUM (PORCINE) 5000 UNIT/ML IJ SOLN
5000.0000 [IU] | Freq: Three times a day (TID) | INTRAMUSCULAR | Status: AC
  Administered 2018-08-11: 5000 [IU] via SUBCUTANEOUS
  Filled 2018-08-11: qty 1

## 2018-08-11 MED ORDER — MIDAZOLAM HCL 5 MG/5ML IJ SOLN
INTRAMUSCULAR | Status: DC | PRN
  Administered 2018-08-11: 2 mg via INTRAVENOUS

## 2018-08-11 MED ORDER — BUPIVACAINE-EPINEPHRINE (PF) 0.25% -1:200000 IJ SOLN
INTRAMUSCULAR | Status: AC
  Filled 2018-08-11: qty 30

## 2018-08-11 MED ORDER — LIDOCAINE 2% (20 MG/ML) 5 ML SYRINGE
INTRAMUSCULAR | Status: AC
  Filled 2018-08-11: qty 5

## 2018-08-11 MED ORDER — SODIUM CHLORIDE 0.9 % IV SOLN
INTRAVENOUS | Status: DC | PRN
  Administered 2018-08-11: 1000 mL

## 2018-08-11 MED ORDER — MIDAZOLAM HCL 2 MG/2ML IJ SOLN
INTRAMUSCULAR | Status: AC
  Filled 2018-08-11: qty 2

## 2018-08-11 MED ORDER — HYDROMORPHONE HCL 1 MG/ML IJ SOLN
0.2500 mg | INTRAMUSCULAR | Status: DC | PRN
  Administered 2018-08-11: 1 mg via INTRAVENOUS

## 2018-08-11 MED ORDER — PROPOFOL 10 MG/ML IV BOLUS
INTRAVENOUS | Status: DC | PRN
  Administered 2018-08-11: 110 mg via INTRAVENOUS

## 2018-08-11 MED ORDER — LIDOCAINE 2% (20 MG/ML) 5 ML SYRINGE
INTRAMUSCULAR | Status: DC | PRN
  Administered 2018-08-11: 20 mg via INTRAVENOUS

## 2018-08-11 MED ORDER — ROCURONIUM BROMIDE 10 MG/ML (PF) SYRINGE
PREFILLED_SYRINGE | INTRAVENOUS | Status: AC
  Filled 2018-08-11: qty 10

## 2018-08-11 MED ORDER — CHLORHEXIDINE GLUCONATE CLOTH 2 % EX PADS
6.0000 | MEDICATED_PAD | Freq: Every day | CUTANEOUS | Status: DC
  Administered 2018-08-12 – 2018-08-14 (×3): 6 via TOPICAL

## 2018-08-11 MED ORDER — SODIUM CHLORIDE 0.9 % IV SOLN
INTRAVENOUS | Status: DC | PRN
  Administered 2018-08-11: 09:00:00 30 ug/min via INTRAVENOUS

## 2018-08-11 MED ORDER — BUPIVACAINE LIPOSOME 1.3 % IJ SUSP
INTRAMUSCULAR | Status: DC | PRN
  Administered 2018-08-11: 20 mL

## 2018-08-11 MED ORDER — FENTANYL CITRATE (PF) 250 MCG/5ML IJ SOLN
INTRAMUSCULAR | Status: AC
  Filled 2018-08-11: qty 5

## 2018-08-11 MED ORDER — SODIUM CHLORIDE 0.9 % IV SOLN
INTRAVENOUS | Status: AC
  Filled 2018-08-11: qty 500000

## 2018-08-11 MED ORDER — BUPIVACAINE-EPINEPHRINE 0.25% -1:200000 IJ SOLN
INTRAMUSCULAR | Status: DC | PRN
  Administered 2018-08-11: 30 mL

## 2018-08-11 MED ORDER — PROMETHAZINE HCL 25 MG/ML IJ SOLN: 6.2500 mg | INTRAMUSCULAR | Status: DC | PRN

## 2018-08-11 MED ORDER — MIDAZOLAM HCL 2 MG/2ML IJ SOLN: 0.5000 mg | Freq: Once | INTRAMUSCULAR | Status: DC | PRN

## 2018-08-11 MED ORDER — SUGAMMADEX SODIUM 200 MG/2ML IV SOLN
INTRAVENOUS | Status: DC | PRN
  Administered 2018-08-11: 180 mg via INTRAVENOUS

## 2018-08-11 MED ORDER — SODIUM CHLORIDE 0.9 % IV SOLN
INTRAVENOUS | Status: DC
  Administered 2018-08-11: 15:00:00 via INTRAVENOUS

## 2018-08-11 MED ORDER — PROPOFOL 10 MG/ML IV BOLUS
INTRAVENOUS | Status: AC
  Filled 2018-08-11: qty 20

## 2018-08-11 MED ORDER — DEXAMETHASONE SODIUM PHOSPHATE 10 MG/ML IJ SOLN
INTRAMUSCULAR | Status: DC | PRN
  Administered 2018-08-11: 10 mg via INTRAVENOUS

## 2018-08-11 MED ORDER — METHOCARBAMOL 1000 MG/10ML IJ SOLN
1000.0000 mg | Freq: Three times a day (TID) | INTRAVENOUS | Status: DC
  Administered 2018-08-11 – 2018-08-13 (×7): 1000 mg via INTRAVENOUS
  Filled 2018-08-11 (×9): qty 10

## 2018-08-11 MED ORDER — ROCURONIUM BROMIDE 50 MG/5ML IV SOSY
PREFILLED_SYRINGE | INTRAVENOUS | Status: DC | PRN
  Administered 2018-08-11: 30 mg via INTRAVENOUS
  Administered 2018-08-11: 10 mg via INTRAVENOUS
  Administered 2018-08-11: 100 mg via INTRAVENOUS
  Administered 2018-08-11: 30 mg via INTRAVENOUS
  Administered 2018-08-11: 20 mg via INTRAVENOUS

## 2018-08-11 MED ORDER — ACETAMINOPHEN 10 MG/ML IV SOLN
1000.0000 mg | Freq: Four times a day (QID) | INTRAVENOUS | Status: AC
  Administered 2018-08-11 – 2018-08-12 (×4): 1000 mg via INTRAVENOUS
  Filled 2018-08-11 (×4): qty 100

## 2018-08-11 MED ORDER — FENTANYL CITRATE (PF) 100 MCG/2ML IJ SOLN
INTRAMUSCULAR | Status: DC | PRN
  Administered 2018-08-11 (×5): 50 ug via INTRAVENOUS
  Administered 2018-08-11: 150 ug via INTRAVENOUS

## 2018-08-11 MED ORDER — PHENYLEPHRINE 40 MCG/ML (10ML) SYRINGE FOR IV PUSH (FOR BLOOD PRESSURE SUPPORT)
PREFILLED_SYRINGE | INTRAVENOUS | Status: DC | PRN
  Administered 2018-08-11 (×2): 200 ug via INTRAVENOUS

## 2018-08-11 MED ORDER — PHENYLEPHRINE 40 MCG/ML (10ML) SYRINGE FOR IV PUSH (FOR BLOOD PRESSURE SUPPORT)
PREFILLED_SYRINGE | INTRAVENOUS | Status: AC
  Filled 2018-08-11: qty 10

## 2018-08-11 MED ORDER — SUCCINYLCHOLINE CHLORIDE 200 MG/10ML IV SOSY
PREFILLED_SYRINGE | INTRAVENOUS | Status: AC
  Filled 2018-08-11: qty 10

## 2018-08-11 MED ORDER — BUPIVACAINE LIPOSOME 1.3 % IJ SUSP
20.0000 mL | Freq: Once | INTRAMUSCULAR | Status: DC
  Filled 2018-08-11: qty 20

## 2018-08-11 MED ORDER — MEPERIDINE HCL 25 MG/ML IJ SOLN: 6.2500 mg | INTRAMUSCULAR | Status: DC | PRN

## 2018-08-11 MED ORDER — DARBEPOETIN ALFA 60 MCG/0.3ML IJ SOSY
60.0000 ug | PREFILLED_SYRINGE | INTRAMUSCULAR | Status: DC
  Administered 2018-08-12: 60 ug via INTRAVENOUS
  Filled 2018-08-11: qty 0.3

## 2018-08-11 MED ORDER — SUCCINYLCHOLINE CHLORIDE 20 MG/ML IJ SOLN
INTRAMUSCULAR | Status: DC | PRN
  Administered 2018-08-11: 120 mg via INTRAVENOUS

## 2018-08-11 SURGICAL SUPPLY — 72 items
ADH SKN CLS APL DERMABOND .7 (GAUZE/BANDAGES/DRESSINGS) ×2
APL PRP STRL LF DISP 70% ISPRP (MISCELLANEOUS) ×2
APL SKNCLS STERI-STRIP NONHPOA (GAUZE/BANDAGES/DRESSINGS) ×2
BAG DECANTER FOR FLEXI CONT (MISCELLANEOUS) ×1 IMPLANT
BENZOIN TINCTURE PRP APPL 2/3 (GAUZE/BANDAGES/DRESSINGS) ×1 IMPLANT
BINDER ABDOMINAL 12 ML 46-62 (SOFTGOODS) ×1 IMPLANT
BIOPATCH RED 1 DISK 7.0 (GAUZE/BANDAGES/DRESSINGS) ×1 IMPLANT
BLADE CLIPPER SURG (BLADE) IMPLANT
BLADE SURG 11 STRL SS (BLADE) ×1 IMPLANT
CANISTER SUCT 3000ML PPV (MISCELLANEOUS) ×3 IMPLANT
CHLORAPREP W/TINT 26 (MISCELLANEOUS) ×3 IMPLANT
COVER SURGICAL LIGHT HANDLE (MISCELLANEOUS) ×3 IMPLANT
COVER WAND RF STERILE (DRAPES) ×3 IMPLANT
DERMABOND ADVANCED (GAUZE/BANDAGES/DRESSINGS) ×1
DERMABOND ADVANCED .7 DNX12 (GAUZE/BANDAGES/DRESSINGS) IMPLANT
DEVICE TROCAR PUNCTURE CLOSURE (ENDOMECHANICALS) ×1 IMPLANT
DRAIN CHANNEL 19F RND (DRAIN) ×1 IMPLANT
DRAPE LAPAROSCOPIC ABDOMINAL (DRAPES) ×3 IMPLANT
DRSG OPSITE POSTOP 4X8 (GAUZE/BANDAGES/DRESSINGS) ×1 IMPLANT
DRSG TEGADERM 4X4.75 (GAUZE/BANDAGES/DRESSINGS) ×1 IMPLANT
ELECT CAUTERY BLADE 6.4 (BLADE) ×3 IMPLANT
ELECT REM PT RETURN 9FT ADLT (ELECTROSURGICAL) ×3
ELECTRODE REM PT RTRN 9FT ADLT (ELECTROSURGICAL) ×2 IMPLANT
EVACUATOR SILICONE 100CC (DRAIN) ×1 IMPLANT
GAUZE SPONGE 4X4 12PLY STRL (GAUZE/BANDAGES/DRESSINGS) IMPLANT
GLOVE BIO SURGEON STRL SZ7.5 (GLOVE) ×1 IMPLANT
GLOVE BIOGEL M STRL SZ7.5 (GLOVE) ×4 IMPLANT
GLOVE BIOGEL PI IND STRL 7.0 (GLOVE) IMPLANT
GLOVE BIOGEL PI IND STRL 7.5 (GLOVE) IMPLANT
GLOVE BIOGEL PI INDICATOR 7.0 (GLOVE) ×3
GLOVE BIOGEL PI INDICATOR 7.5 (GLOVE) ×1
GLOVE INDICATOR 7.5 STRL GRN (GLOVE) ×1 IMPLANT
GLOVE INDICATOR 8.0 STRL GRN (GLOVE) ×6 IMPLANT
GLOVE SURG SIGNA 7.5 PF LTX (GLOVE) ×1 IMPLANT
GLOVE SURG SYN 7.5  E (GLOVE) ×2
GLOVE SURG SYN 7.5 E (GLOVE) ×4 IMPLANT
GLOVE SURG SYN 7.5 PF PI (GLOVE) IMPLANT
GOWN STRL REUS W/ TWL LRG LVL3 (GOWN DISPOSABLE) ×4 IMPLANT
GOWN STRL REUS W/ TWL XL LVL3 (GOWN DISPOSABLE) IMPLANT
GOWN STRL REUS W/TWL 2XL LVL3 (GOWN DISPOSABLE) ×3 IMPLANT
GOWN STRL REUS W/TWL LRG LVL3 (GOWN DISPOSABLE) ×3
GOWN STRL REUS W/TWL XL LVL3 (GOWN DISPOSABLE) ×9
HANDLE SUCTION POOLE (INSTRUMENTS) IMPLANT
KIT BASIN OR (CUSTOM PROCEDURE TRAY) ×3 IMPLANT
KIT TURNOVER KIT B (KITS) ×3 IMPLANT
MARKER SKIN DUAL TIP RULER LAB (MISCELLANEOUS) ×1 IMPLANT
MESH PHASIX RESORB RECT 15X20 (Mesh General) ×1 IMPLANT
NDL HYPO 25GX1X1/2 BEV (NEEDLE) ×2 IMPLANT
NEEDLE 22X1 1/2 (OR ONLY) (NEEDLE) ×1 IMPLANT
NEEDLE HYPO 25GX1X1/2 BEV (NEEDLE) ×3 IMPLANT
NS IRRIG 1000ML POUR BTL (IV SOLUTION) ×5 IMPLANT
PACK GENERAL/GYN (CUSTOM PROCEDURE TRAY) ×3 IMPLANT
PAD ARMBOARD 7.5X6 YLW CONV (MISCELLANEOUS) ×3 IMPLANT
PENCIL SMOKE EVACUATOR (MISCELLANEOUS) ×2 IMPLANT
SLEEVE SCD COMPRESS KNEE MED (MISCELLANEOUS) ×1 IMPLANT
SPONGE LAP 18X18 RF (DISPOSABLE) ×2 IMPLANT
STAPLER VISISTAT 35W (STAPLE) IMPLANT
STRIP CLOSURE SKIN 1/2X4 (GAUZE/BANDAGES/DRESSINGS) ×1 IMPLANT
SUCTION POOLE HANDLE (INSTRUMENTS) ×3
SUT ETHILON 2 0 FS 18 (SUTURE) ×1 IMPLANT
SUT MNCRL AB 4-0 PS2 18 (SUTURE) ×3 IMPLANT
SUT NOVA NAB GS-21 0 18 T12 DT (SUTURE) ×1 IMPLANT
SUT PDS AB 1 TP1 96 (SUTURE) ×2 IMPLANT
SUT PROLENE 0 CT 2 (SUTURE) ×2 IMPLANT
SUT VIC AB 3-0 SH 18 (SUTURE) ×2 IMPLANT
SUT VIC AB 3-0 SH 27 (SUTURE) ×3
SUT VIC AB 3-0 SH 27X BRD (SUTURE) ×2 IMPLANT
SYR 30ML LL (SYRINGE) ×1 IMPLANT
SYR CONTROL 10ML LL (SYRINGE) ×3 IMPLANT
TOWEL GREEN STERILE (TOWEL DISPOSABLE) ×3 IMPLANT
TOWEL GREEN STERILE FF (TOWEL DISPOSABLE) ×2 IMPLANT
TRAY FOLEY MTR SLVR 14FR STAT (SET/KITS/TRAYS/PACK) IMPLANT

## 2018-08-11 NOTE — Brief Op Note (Addendum)
08/11/2018  12:48 PM  PATIENT:  Alex Morrison  48 y.o. male  PRE-OPERATIVE DIAGNOSIS:  Incarcerated Incisional Hernia;   POST-OPERATIVE DIAGNOSIS:  Incarcerated Incisional Hernia  PROCEDURE:  Procedure(s): OPEN RETRORECTUS REPAIR OF INCARCERATED INCISIONAL HERNIA WITH BILATERAL POSTERIOR COMPONENT SEPARATION (N/A) EXPLORATORY LAPAROTOMY INSERTION OF PHASIX MESH LYSIS OF ADHESIONS X 30 MINUTES  SURGEON:  Surgeon(s) and Role:    [name], MD - Primary    * Alphonsa Overall, MD - Assisting  PHYSICIAN ASSISTANT:   ASSISTANTS: see above   ANESTHESIA:   general  EBL:  100 mL   BLOOD ADMINISTERED:none  DRAINS: (19) Jackson-Pratt drain(s) with closed bulb suction in the retrorectus space, Nasogastric Tube and Urinary Catheter (Foley)   LOCAL MEDICATIONS USED:  MARCAINE    and OTHER exparel  SPECIMEN:  Source of Specimen:  hernia sac  DISPOSITION OF SPECIMEN:  N/A  COUNTS:  YES  TOURNIQUET:  * No tourniquets in log *  DICTATION: Viviann Spare Dictation [id number]  PLAN OF CARE: Admit to inpatient   PATIENT DISPOSITION:  PACU - hemodynamically stable.   Delay start of Pharmacological VTE agent (>24hrs) due to surgical blood loss or risk of bleeding: no  Alex Ruff. Redmond Pulling, MD, FACS General, Bariatric, & Minimally Invasive Surgery [facility], [place]

## 2018-08-11 NOTE — Op Note (Signed)
NAME: Alex Morrison, Alex Morrison MEDICAL RECORD ZO:[id number] ACCOUNT [id number] DATE OF BIRTH:03/15/1970 FACILITY: [facility] LOCATION: [facility] PHYSICIAN:Demetry Bendickson Ronnie Derby, MD  OPERATIVE REPORT  DATE OF PROCEDURE:  08/11/2018  PREOPERATIVE DIAGNOSIS:  Incarcerated incisional hernia, end-stage renal disease on peritoneal dialysis.  POSTOPERATIVE DIAGNOSIS:  Incarcerated incisional hernia, end-stage renal disease on peritoneal dialysis.  PROCEDURE PERFORMED:   1.  Exploratory laparotomy, lysis of adhesions, 30 minutes. 2.  Open retrorectus repair of incarcerated incisional hernia with bilateral posterior component separation - bilateral TAR 3.  Insertion of Phasix mesh.  SURGEON:  Leighton Ruff.  Redmond Pulling, MD  ASSISTANT:  Alphonsa Overall MD  ANESTHESIA:  General.  ESTIMATED BLOOD LOSS:  100 mL.  SPECIMEN:  Hernia sac, which was discarded.  DRAINS:  A 19 round Jackson-Pratt drain in the retrorectus space NG tube and Foley catheter.  INDICATIONS:  The patient is 48 year old, type 1 diabetic male with end-stage renal disease on peritoneal dialysis.  His surgical history includes an open ileocecectomy for ruptured appendicitis in 2016.  He developed an incisional hernia.  He progressed  to renal failure requiring some form of dialysis.  The patient elected for peritoneal dialysis.  He was sent to outside surgeon in the fall of 2019 where he underwent a robotic primary repair of an incisional hernia with a defect of 20 x 10 cm and  laparoscopic placement of a peritoneal dialysis catheter.  No mesh was used during that surgery because of placement of a PD catheter.  He just had a primary robotic repair with several running V-Loc sutures.  Unfortunately, about a week ago, the patient  developed a bulge in his midline.  He subsequently developed severe abdominal pain and came to the emergency room.  He was found to have an incarcerated incisional hernia causing a bowel obstruction.  Nasogastric tube was placed.  Part of  his incisional  hernia was able to be reduced, another part was not.  He did not have any signs of strangulation.  Because he had ongoing incarcerated incisional hernia, we recommended surgical intervention.  One of the patient's primary goals was to be able to resume  peritoneal dialysis at some point as his preferred method of dialysis.  He did get placement of a hemodialysis catheter over the weekend.  He also underwent hemodialysis over the weekend as well.  I had a long conversation with the patient on 2 separate  occasions regarding his surgical problem.  This is his second recurrence of an abdominal wall hernia.  At this point, a primary muscle repair was really not an option.  The hernia measured 11 x 6 cm on CT imaging.  I told him for prevention of  recurrence, he would ideally need mesh insertion.  We talked about different placement areas of the mesh in such as an onlay, retrorectus or intraperitoneal.  We decided to avoid an intraperitoneal placement due to the fact that the patient wanted to  continue with peritoneal dialysis long term.  We discussed the possibility that I may have to revise and/or remove the PD catheter during this procedure if it was involved with the planned repair.  We talked about risks and benefits of surgery, which  were separately documented in the medical record.  DESCRIPTION OF PROCEDURE:  The patient received 5000 units of subcutaneous heparin preoperatively.  He received IV antibiotic.  He was taken to the OR #1 at [facility], placed supine on the operating room table.  General endotracheal anesthesia  was established.  Sequential  compression devices were placed.  A Foley catheter was placed.  His abdomen was prepped and draped in the usual standard surgical fashion.  The PD catheter was draped outside the surgical field.  Surgical timeout was  performed.  He had a scar in his midline from his prior ileocecectomy that healed by secondary intention.  The  scar was elliptically excised sharply with a 10 blade and discarded.  The fascia was entered in the upper midline.  His prior midline incision,  extended almost up to the xiphoid.  It extended below the umbilicus by about an inch and a half.  I was able to enter the abdominal cavity in the upper midline.  There were some omental adhesions to the underlying abdominal wall.  We then took down the  omental adhesions.  As we continued to open up the midline inferiorly toward the pelvis, The patient had a Swiss cheese defect just around and above the umbilicus.  There is a loop of bowel going through one of the fascial defects that was reduced.   There is another loop of small bowel in another fascial defect that was hyperemic.  We opened up the defect and we were able to release the bowel and immediately started pinking up.  We again continued to open up the fascia just below the umbilicus.  We  stopped at this point, since the lower midline was intact without any fascial defect.  I then took down the remaining adhesions to the anterior abdominal wall.  These were mainly omental adhesions.  The peritoneal dialysis catheter was encountered in  the abdominal cavity.  There was a little bit of omentum stuck to it and the omentum was freed from the PD catheter.  We visualized the insertion point of the PD catheter through the right rectus.  It was quite lateral.  At this point, we looked at the  fascial defect.  It was not able to be primarily brought together, so I decided to proceed with a retrorectus repair.  Starting on the left side of the abdomen.  I identified the edge of the rectus and made an incision into the posterior fascia into the  posterior rectus sheath to about 1 cm medial from the edge.  I then opened up the retrorectus space up and down longitudinally.  The plane slightly inferior to the umbilicus was a little bit hard to get into, but ultimately was able into the retrorectus space in that   location.  The dissection was carried just to the edge of the perforating vessels in the retrorectus space.  We then turned our attention to the right side of the abdomen in a similar fashion, incised the posterior rectus sheath about 1 cm medial to the  fascial edge, again getting into the retrorectus space.  This mainly was an avascular space extending it longitudinally superiorly and inferiorly.  We then carried the retrorectus dissection cephalad and were able to stay in the preperitoneal space and  extended up just to the area of the xiphoid.  We then continued inferiorly and met when the 2 joined the retrorectus space just below the umbilicus.  We then put Allis' on each edge of the retrorectus on the posterior fascia.  It would still not be  brought together.  There was still tension and the gap between the two edges, so therefore, I decided we needed to do a posterior component separation release,  so I decided to do a bilateral transversalis abdominis release, a TAR  procedure.  About 1 cm  just slightly medial to the perforating vessels,  I incised the posterior sheath and transected the transversalis muscle.  I was able to get into the transversalis space quite easily on the right side of the abdomen.  We continued this plane superior up  toward the rib cage, inferiorly toward the pelvis as well.  In a similar fashion,  I incised the posterior fascia just before the perforating vessels on the right.  I incised the transversalis fascia getting into the transversalis space.  It was a little  bit more challenging getting into this space on the patient's left.  We were able to get into it eventually.  It was in the avascular space.  I was able to bluntly lifted up and push away the transversalis muscle.  We continued it above and below.  At  this point,  the posterior fascia would come together in the midline.  At this point, the abdomen was irrigated with saline.  The omentum had 2 areas of defect in  it.  Using a running 3-0 Vicryl, I closed the defect in the omentum to reapproximate in 1  area and used another running 3-0 Vicryl in another area.  The omentum was placed over the small bowel.  The peritoneal dialysis catheter was oriented toward the left lower quadrant.  It should be noted that when we did a retrorectus dissection on the  Right, We did not encounter the PD catheter coming through the rectus.  We stayed medial to that.  At this point, I closed the posterior fascia with a running 0 Prolene, 1 from above, 1 from below.  There were 3 areas where there is a small rent in the  posterior fascia and peritoneum and these were closed with figure-of-eight 0 Vicryl pops prior to closing the midline posterior fascia.  At this point, we had a large space in the retrorectus space.  We had extensive discussion regarding what type of  Mesh to place.  We discussed pros and cons of synthetic versus absorbable mesh.  Because the patient wanted to resume PD dialysis in the future and because that he was at a slightly increased risk for hernia recurrence, I felt that the safest option  in case he were to have a recurrence with exposure of the peritoneal dialysis to the mesh would be an absorbable mesh.  So a 15 cm x 20 cm piece of Phasix mesh was obtained.  It was oriented vertically.  The retrorectus space measured about 20 cm  vertically by about 18 cm wide, so I felt the mesh that size was appropriate.  The mesh set easily in the retrorectus space.  It was trimmed slightly around the 4 corners.  Then, using a Novafil, I placed a total of 8 sutures along the edges of the mesh  about a quarter of an inch from the mesh edge, one at the top and one at the bottom and 3 along the sides.  We then made small nicks in the skin in the corresponding location and brought out the tails of each of the previously tied suture in the mesh  transfascially using the Endo Close device.  I placed a 19 round JP drain through the  left lateral abdominal wall and laid into the retrorectus space.  It was secured with a 2-0 nylon.  The retrorectus space was irrigated with antibiotic saline.  We then  excised some of the hernia sac in the soft tissue.  We did not encounter the PD catheter in the soft tissue on the right.  We could palpate it, but we did not encounter it.  We left a little bit of hernia sac on the right side just as we did not want to expose the  PD catheter per se.  At this point, I injected the Exparel, Marcaine mixture in the abdominal wall muscle circumferentially in and around the transfascial suture sites.  I then closed the midline of the anterior fascia with a running looped PDS, one from  above, one from below.  We then tied down the transfascial sutures.  The soft tissue transfascial sites were released with a hemostat.  I then irrigated the subcutaneous tissue and closed the skin with skin staples.  Steri-Strips were placed on the transfascial sites.   Dressings were then applied.  The patient was extubated and taken to recovery room in stable condition.    All needle, instrument and sponge counts were correct x2.  There were no immediate complications.    The patient tolerated the procedure well.  AN/NUANCE  D:08/11/2018 T:08/11/2018 JOB:[id number]

## 2018-08-11 NOTE — Progress Notes (Signed)
[facility] PROGRESS NOTE  Alex Morrison [employer] [facility] VVO:[id number] DOB: 1970/09/06 DOA: 08/07/2018 PCP: Haywood Pao, MD  Brief summary   48 year old male with past medical history of diabetes mellitus type 1, hypertension, end-stage renal disease on peritoneal dialysis.  Patient presented to the ED for evaluation of abdominal pain.  Patient was not noted to have a large abdominal hernia, upon ED evaluation CT of the abdomen showed high-grade SBO involving PD catheter.  Patient admitted with working diagnosis of SBO and hemodialysis  Assessment/Plan:  Incarcerated periumbilical hernia/high-grade SBO. On NG tube. Underwent exploratory lap/lysis adhesions, repair of incarcerated hernia on 7/27. General surgery recommendations appreciated.  Continue management per GS.  Continue antibiotics for now.  End-stage renal disease on CCPD. On peritoneal dialysis at home. Given the presentation with high-grade SBO requiring surgical correction office incisional hernia patient will not be able to do PD until the wound heals. Therefore patient will transition to HD. HD catheter placed. HD planned for 7/28. Renal recommendations appreciated  Diabetes mellitus type 1, controlled with renal complication. Stable, continue home Lantus and sliding scale.  Patient n.p.o. monitor glucose closely and avoid hypoglycemia.  Hypertension. BP stable, continue amlodipine and metoprolol.  Holding Lasix and ibesartan given that was awaiting for hemodialysis.  Resume if needed  Code Status: full Family Communication: d/w patient, RN (indicate person spoken with, relationship, and if by phone, the number) Disposition Plan: remains inpatient    Consultants:  Surgery   Procedures:  Hernia repair 7/27  Antibiotics: Anti-infectives (From admission, onward)   Start     Dose/Rate Route Frequency Ordered Stop   08/11/18 1156  polymyxin B 500,000 Units, bacitracin 50,000 Units in sodium chloride 0.9 % 500 mL  irrigation  Status:  Discontinued       As needed 08/11/18 1156 08/11/18 1239   08/11/18 0700  ceFAZolin (ANCEF) IVPB 2g/100 mL premix     2 g 200 mL/hr over 30 Minutes Intravenous To ShortStay Surgical 08/10/18 2018 08/11/18 0857   08/11/18 0600  ceFAZolin (ANCEF) powder 2 g  Status:  Discontinued     2 g Other On call to O.R. 08/10/18 2010 08/10/18 2017   08/08/18 1645  ceFAZolin (ANCEF) IVPB 2g/100 mL premix     2 g 200 mL/hr over 30 Minutes Intravenous  Once 08/08/18 1632 08/08/18 1702   08/08/18 1620  ceFAZolin (ANCEF) 2-4 GM/100ML-% IVPB    Note to Pharmacy: Desiree Hane   : cabinet override      08/08/18 1620 08/09/18 0429        (indicate start date, and stop date if known)  HPI/Subjective: Post op. Reports feeling well. No distress   Objective: Vitals:   08/11/18 1345 08/11/18 1436  BP: 115/60 131/70  Pulse: 96 93  Resp:  16  Temp: 98.4 F (36.9 C) 99.5 F (37.5 C)  SpO2: 97% 97%    Intake/Output Summary (Last 24 hours) at 08/11/2018 1501 Last data filed at 08/11/2018 1400 Gross per 24 hour  Intake 1222 ml  Output 1005 ml  Net 217 ml   Filed Weights   08/10/18 0004 08/10/18 0500 08/11/18 0500  Weight: 85.4 kg 85.4 kg 85.8 kg    Exam:   General:  No distress   Cardiovascular: s1,s2 rrr  Respiratory: CTA BL  Abdomen: mild post op tender   Musculoskeletal: no leg edema    Data Reviewed: Basic Metabolic Panel: Recent Labs  Lab 08/07/18 2336 08/09/18 0348 08/10/18 0456 08/11/18 0443  NA 136 140 138  141  K 3.6 3.3* 3.7 3.6  CL 96* 90* 98 100  CO2 27 32 28 27  GLUCOSE 263* 137* 142* 104*  BUN 75* 89* 50* 69*  CREATININE 10.16* 12.47* 9.63* 11.23*  CALCIUM 9.4 9.3 8.1* 8.0*   Liver Function Tests: Recent Labs  Lab 08/07/18 2336 08/09/18 0348  AST 17 14*  ALT 23 16  ALKPHOS 66 54  BILITOT 0.6 0.7  PROT 5.6* 5.4*  ALBUMIN 3.2* 2.9*   Recent Labs  Lab 08/07/18 2336  LIPASE 27   No results for input(s): AMMONIA in the last  168 hours. CBC: Recent Labs  Lab 08/07/18 2336 08/09/18 0348 08/10/18 0456 08/11/18 0443  WBC 9.1 9.5 7.4 5.9  HGB 9.8* 9.8* 8.9* 8.8*  HCT 28.5* 29.3* 27.1* 26.5*  MCV 89.6 91.8 93.4 92.3  PLT 194 165 133* 132*   Cardiac Enzymes: No results for input(s): CKTOTAL, CKMB, CKMBINDEX, TROPONINI in the last 168 hours. BNP (last 3 results) No results for input(s): BNP in the last 8760 hours.  ProBNP (last 3 results) No results for input(s): PROBNP in the last 8760 hours.  CBG: Recent Labs  Lab 08/11/18 0414 08/11/18 0811 08/11/18 0902 08/11/18 1253 08/11/18 1434  GLUCAP 87 84 106* 132* 159*    Recent Results (from the past 240 hour(s))  SARS Coronavirus 2 (CEPHEID - Performed in [place] hospital lab), Hosp Order     Status: None   Collection Time: 08/08/18  5:12 AM   Specimen: Nasopharyngeal Swab  Result Value Ref Range Status   SARS Coronavirus 2 NEGATIVE NEGATIVE Final    Comment: (NOTE) If result is NEGATIVE SARS-CoV-2 target nucleic acids are NOT DETECTED. The SARS-CoV-2 RNA is generally detectable in upper and lower  respiratory specimens during the acute phase of infection. The lowest  concentration of SARS-CoV-2 viral copies this assay can detect is 250  copies / mL. A negative result does not preclude SARS-CoV-2 infection  and should not be used as the sole basis for treatment or other  patient management decisions.  A negative result may occur with  improper specimen collection / handling, submission of specimen other  than nasopharyngeal swab, presence of viral mutation(s) within the  areas targeted by this assay, and inadequate number of viral copies  (<250 copies / mL). A negative result must be combined with clinical  observations, patient history, and epidemiological information. If result is POSITIVE SARS-CoV-2 target nucleic acids are DETECTED. The SARS-CoV-2 RNA is generally detectable in upper and lower  respiratory specimens dur ing the acute  phase of infection.  Positive  results are indicative of active infection with SARS-CoV-2.  Clinical  correlation with patient history and other diagnostic information is  necessary to determine patient infection status.  Positive results do  not rule out bacterial infection or co-infection with other viruses. If result is PRESUMPTIVE POSTIVE SARS-CoV-2 nucleic acids MAY BE PRESENT.   A presumptive positive result was obtained on the submitted specimen  and confirmed on repeat testing.  While 2019 novel coronavirus  (SARS-CoV-2) nucleic acids may be present in the submitted sample  additional confirmatory testing may be necessary for epidemiological  and / or clinical management purposes  to differentiate between  SARS-CoV-2 and other Sarbecovirus currently known to infect humans.  If clinically indicated additional testing with an alternate test  methodology [phone]) is advised. The SARS-CoV-2 RNA is generally  detectable in upper and lower respiratory sp ecimens during the acute  phase of infection. The expected  result is Negative. Fact Sheet for Patients:  StrictlyIdeas.no Fact Sheet for Healthcare Providers: BankingDealers.co.za This test is not yet approved or cleared by the [place] FDA and has been authorized for detection and/or diagnosis of SARS-CoV-2 by FDA under an Emergency Use Authorization (EUA).  This EUA will remain in effect (meaning this test can be used) for the duration of the COVID-19 declaration under Section 564(b)(1) of the Act, 21 U.S.C. section 360bbb-3(b)(1), unless the authorization is terminated or revoked sooner. Performed at [facility], [place] [address], [place], [place] [address]   MRSA PCR Screening     Status: None   Collection Time: 08/10/18  6:31 PM   Specimen: Nasal Mucosa; Nasopharyngeal  Result Value Ref Range Status   MRSA by PCR NEGATIVE NEGATIVE Final    Comment:        The  GeneXpert MRSA Assay (FDA approved for NASAL specimens only), is one component of a comprehensive MRSA colonization surveillance program. It is not intended to diagnose MRSA infection nor to guide or monitor treatment for MRSA infections. Performed at [facility], [place] [address], [place], [place] [address]      Studies: No results found.  Scheduled Meds: . Chlorhexidine Gluconate Cloth  6 each Topical Q0600  . heparin injection (subcutaneous)  5,000 Units Subcutaneous Q8H  . HYDROmorphone      . insulin aspart  0-15 Units Subcutaneous Q4H  . insulin glargine  5 Units Subcutaneous QHS  . metoprolol tartrate  2.5 mg Intravenous Q6H  . pantoprazole (PROTONIX) IV  40 mg Intravenous Q24H   Continuous Infusions: . sodium chloride 100 mL/hr at 08/11/18 1433  . acetaminophen 1,000 mg (08/11/18 1457)  . methocarbamol (ROBAXIN) IV      Principal Problem:   Abdominal hernia as complication of peritoneal dialysis Active Problems:   Type 1 diabetes mellitus (HCC)   ESRD (end stage renal disease) (HCC)   HTN (hypertension)   SBO (small bowel obstruction) ([place])   Periumbilical hernia    Time spent: >25 minutes     Kinnie Feil  [facility] Pager [phone]. If 7PM-7AM, please contact night-coverage at [email or web address], password [facility] 08/11/2018, 3:01 PM  LOS: 3 days

## 2018-08-11 NOTE — Progress Notes (Signed)
Pt returned to room 5N29 after surgery. Received report from [place], RN in PACU. See assessment. Will continue to monitor.

## 2018-08-11 NOTE — Anesthesia Procedure Notes (Signed)
Procedure Name: Intubation Date/Time: 08/11/2018 8:49 AM Performed by: Lance Coon, CRNA Pre-anesthesia Checklist: Patient identified, Emergency Drugs available, Suction available, Patient being monitored and Timeout performed Patient Re-evaluated:Patient Re-evaluated prior to induction Oxygen Delivery Method: Circle system utilized Preoxygenation: Pre-oxygenation with 100% oxygen Induction Type: IV induction, Rapid sequence and Cricoid Pressure applied Laryngoscope Size: Miller and 3 Grade View: Grade II Tube type: Oral Tube size: 7.5 mm Number of attempts: 1 Airway Equipment and Method: Stylet Placement Confirmation: ETT inserted through vocal cords under direct vision,  positive ETCO2 and breath sounds checked- equal and bilateral Secured at: 23 cm Tube secured with: Tape Dental Injury: Teeth and Oropharynx as per pre-operative assessment

## 2018-08-11 NOTE — Plan of Care (Signed)
Problem: Education: Goal: Knowledge of General Education information will improve Description: Including pain rating scale, medication(s)/side effects and non-pharmacologic comfort measures Outcome: Progressing   Problem: Health Behavior/Discharge Planning: Goal: Ability to manage health-related needs will improve Outcome: Progressing   Problem: Clinical Measurements: Goal: Ability to maintain clinical measurements within normal limits will improve Outcome: Progressing  Goal: Respiratory complications will improve Outcome: Progressing  Goal: Cardiovascular complication will be avoided Outcome: Progressing   Problem: Coping: Goal: Level of anxiety will decrease Outcome: Progressing   Problem: Elimination: Goal: Will not experience complications related to bowel motility Outcome: Progressing  Goal: Will not experience complications related to urinary retention Outcome: Progressing   Problem: Pain Managment: Goal: General experience of comfort will improve Outcome: Progressing   Problem: Safety: Goal: Ability to remain free from injury will improve Outcome: Progressing   Problem: Skin Integrity: Goal: Risk for impaired skin integrity will decrease Outcome: Progressing

## 2018-08-11 NOTE — Interval H&P Note (Signed)
History and Physical Interval Note:  08/11/2018 8:14 AM  Alex Morrison  has presented today for surgery, with the diagnosis of Incarcerated Incisional Hernia.  The various methods of treatment have been discussed with the patient and family. After consideration of risks, benefits and other options for treatment, the patient has consented to  Procedure(s): HERNIA REPAIR INCISIONAL (N/A) as a surgical intervention.  The patient's history has been reviewed, patient examined, no change in status, stable for surgery.  I have reviewed the patient's chart and labs.  Questions were answered to the patient's satisfaction.    Exploratory laparotomy, lysis of adhesions, open repair of incisional hernia, possible revision removal of PD catheter  We discussed the etiology of ventral hernias. We discussed the signs and symptoms of incarceration and strangulation. The patient was given educational material. I also drew diagrams.  We discussed the risk and benefits of surgery including but not limited to bleeding, infection, injury to surrounding structures, hernia recurrence, mesh complications, hematoma/seroma formation,  blood clot formation, urinary retention, post operative ileus, general anesthesia risk, long-term abdominal pain. We discussed that this procedure can be quite uncomfortable and difficult to recover from based on how the mesh is secured to the abdominal wall. We discussed the importance of avoiding heavy lifting and straining for a period of 6 weeks.  I discussed with the patient that I may not be able to salvage his PD catheter during this procedure.  Discussed that the primary goal today is to resolve his small bowel obstruction and reconstruct his abdominal wall.  We discussed the use of mesh.  We discussed different locations to place mesh.  We discussed the pros and cons of each approach.  We discussed that a lot of times it depends on intra-abdominal findings.  We discussed the possibility  of mesh infection.  We discussed the possibility that he may not be a candidate for peritoneal dialysis long-term.  I am not sure if ongoing we will to salvage the PD catheter today.  His fascial defect is approximately 6 cm wide by 11 cm vertical.  We did discuss the risk of enterotomy during the procedure as well as possible need for repair small bowel or small bowel resection.  We discussed that should we have a bowel injury then the use of permanent mesh is not an option.  Because of his desire for resumption of peritoneal dialysis if we are able to place mesh in the retrorectus space or even as an onlay I would probably use a absorbable mesh such as phasix  We did discuss that he has had a moderate risk for ventral hernia recurrence given his surgical history and comorbidities.  Alex Ruff. Redmond Pulling, MD, FACS General, Bariatric, & Minimally Invasive Surgery [facility]   [name]

## 2018-08-11 NOTE — Anesthesia Postprocedure Evaluation (Signed)
Anesthesia Post Note  Patient: Alex Morrison  Procedure(s) Performed: OPEN RETRORECTUS REPAIR OF INCARCERATED INCISIONAL HERNIA WITH BILATERAL POSTERIOR COMPONENT SEPARATION (N/A Abdomen) EXPLORATORY LAPAROTOMY (Abdomen) INSERTION OF PHASIX MESH (Abdomen) LYSIS OF ADHESIONS X 30 MINUTES (Abdomen)     Patient location during evaluation: PACU Anesthesia Type: General Level of consciousness: oriented, patient cooperative and sedated Pain management: pain level controlled Vital Signs Assessment: post-procedure vital signs reviewed and stable Respiratory status: spontaneous breathing, nonlabored ventilation, respiratory function stable and patient connected to nasal cannula oxygen Cardiovascular status: blood pressure returned to baseline and stable Postop Assessment: no apparent nausea or vomiting Anesthetic complications: no    Last Vitals:  Vitals:   08/11/18 1256 08/11/18 1302  BP: (!) 123/58   Pulse: 95 94  Resp: 12 18  Temp:    SpO2: 100% 99%    Last Pain:  Vitals:   08/11/18 1312  TempSrc:   PainSc: Asleep                 Ashleymarie Granderson,E. Baily Serpe

## 2018-08-11 NOTE — Progress Notes (Signed)
Pt's mother, Lorrin Goodell, called for update on pt status and plan of care. Pt stated it was okay for RN to update her. All questions answered to satisfaction. Will continue to monitor.

## 2018-08-11 NOTE — Progress Notes (Signed)
Inpatient Diabetes Program Recommendations  AACE/ADA: New Consensus Statement on Inpatient Glycemic Control (2015)  Target Ranges:  Prepandial:   less than 140 mg/dL      Peak postprandial:   less than 180 mg/dL (1-2 hours)      Critically ill patients:  140 - 180 mg/dL   Lab Results  Component Value Date   GLUCAP 159 (H) 08/11/2018   HGBA1C 9.2 (H) 08/08/2018    Review of Glycemic Control Results for Alex Morrison, Alex Morrison (MRN [id number]) as of 08/11/2018 15:57  Ref. Range 08/11/2018 04:14 08/11/2018 08:11 08/11/2018 09:02 08/11/2018 12:53 08/11/2018 14:34  Glucose-Capillary Latest Ref Range: 70 - 99 mg/dL 87 84 106 (H) 132 (H) 159 (H)   Inpatient Diabetes Program Recommendations:   Consider: -Decrease Novolog correction to sensitive  Thank you, Bethena Roys E. Tameko Halder, RN, MSN, CDE  Diabetes Coordinator Inpatient Glycemic Control Team Team Pager [phone] (8am-5pm) 08/11/2018 3:57 PM

## 2018-08-11 NOTE — Anesthesia Preprocedure Evaluation (Addendum)
Anesthesia Evaluation  Patient identified by MRN, date of birth, ID band Patient awake    Reviewed: Allergy & Precautions, NPO status , Patient's Chart, lab work & pertinent test results, reviewed documented beta blocker date and time   History of Anesthesia Complications Negative for: history of anesthetic complications  Airway Mallampati: I  TM Distance: >3 FB Neck ROM: Full    Dental  (+) Dental Advisory Given   Pulmonary  08/07/2018 SARS coronavirus neg   breath sounds clear to auscultation       Cardiovascular hypertension, Pt. on medications and Pt. on home beta blockers (-) angina Rhythm:Regular Rate:Normal  '16 ECHO: No reversible ischemia. LVEF 59% with normal wall motion. This is a low risk study.   Neuro/Psych negative neurological ROS     GI/Hepatic Neg liver ROS, Incarcerated ventral hernia/SBO, no longer vomiting now that has NG   Endo/Other  diabetes (glu 84), Insulin Dependent  Renal/GU Dialysis and ESRFRenal disease (peritoneal dialysis, K+ 3.6)     Musculoskeletal   Abdominal   Peds  Hematology  (+) Blood dyscrasia (Hb 8.8, plt 132k), anemia ,   Anesthesia Other Findings   Reproductive/Obstetrics                            Anesthesia Physical Anesthesia Plan  ASA: III  Anesthesia Plan: General   Post-op Pain Management:    Induction: Intravenous and Rapid sequence  PONV Risk Score and Plan: 3 and Ondansetron, Dexamethasone and Treatment may vary due to age or medical condition  Airway Management Planned: Oral ETT  Additional Equipment:   Intra-op Plan:   Post-operative Plan: Extubation in OR  Informed Consent: I have reviewed the patients History and Physical, chart, labs and discussed the procedure including the risks, benefits and alternatives for the proposed anesthesia with the patient or authorized representative who has indicated his/her understanding  and acceptance.     Dental advisory given  Plan Discussed with: CRNA and Surgeon  Anesthesia Plan Comments:        Anesthesia Quick Evaluation

## 2018-08-11 NOTE — Transfer of Care (Signed)
Immediate Anesthesia Transfer of Care Note  Patient: Alex Morrison  Procedure(s) Performed: OPEN RETRORECTUS REPAIR OF INCARCERATED INCISIONAL HERNIA WITH BILATERAL POSTERIOR COMPONENT SEPARATION (N/A Abdomen) EXPLORATORY LAPAROTOMY (Abdomen) INSERTION OF PHASIX MESH (Abdomen) LYSIS OF ADHESIONS X 30 MINUTES (Abdomen)  Patient Location: PACU  Anesthesia Type:General  Level of Consciousness: drowsy and patient cooperative  Airway & Oxygen Therapy: Patient Spontanous Breathing  Post-op Assessment: Report given to RN and Post -op Vital signs reviewed and stable  Post vital signs: Reviewed and stable  Last Vitals:  Vitals Value Taken Time  BP    Temp    Pulse    Resp    SpO2      Last Pain:  Vitals:   08/11/18 0640  TempSrc: Oral  PainSc:          Complications: No apparent anesthesia complications

## 2018-08-11 NOTE — Progress Notes (Signed)
[facility] Progress Note  Subjective:  S/p exlap and lysis of adhesions and open repair of incarcerated incisional hernia earlier today.  HR 80's on arrival to floor and the 49 rate charted is from earlier today which flowed in after he came to the unit from PACU.    Review of systems:  Npo - post surgical  Denies shortness of breath or chest pain  Post-op discomfort   Vitals:   08/11/18 1302 08/11/18 1312 08/11/18 1345 08/11/18 1436  BP:  121/61 115/60 131/70  Pulse: 94 96 96 93  Resp: 18   16  Temp:   98.4 F (36.9 C) 99.5 F (37.5 C)  TempSrc:    Oral  SpO2: 99% 97% 97% 97%  Weight:      Height:        Inpatient medications: . Chlorhexidine Gluconate Cloth  6 each Topical Q0600  . heparin injection (subcutaneous)  5,000 Units Subcutaneous Q8H  . HYDROmorphone      . insulin aspart  0-15 Units Subcutaneous Q4H  . insulin glargine  5 Units Subcutaneous QHS  . metoprolol tartrate  2.5 mg Intravenous Q6H  . pantoprazole (PROTONIX) IV  40 mg Intravenous Q24H   . sodium chloride 100 mL/hr at 08/11/18 1433  . acetaminophen 1,000 mg (08/11/18 1457)  . methocarbamol (ROBAXIN) IV 1,000 mg (08/11/18 1546)   hydrALAZINE, menthol-cetylpyridinium, morphine injection, ondansetron **OR** ondansetron (ZOFRAN) IV, phenol    Exam: Gen alert, no distress No jvd or bruits Chest clear bilat to bases RRR no MRG Abd - binder is intact and not removed; post-op pt  MS no joint effusions or deformity Ext no LE edema Neuro alert and oriented x 3    Home meds:  - amlodipine 10 / clonidine 0.1 tid/ irbesartan 300 qd/ metoprolol xl 25 qd  - insulin aspart SSI tid 0- 10u  - furosemide 80 bid/ sevelamer carb ac tid    Outpt PD: 4 overnight, 2.3 L fill, 1.5 h dwell, no day bag or pause - got venofer 200mg  6/22 and mircera 75 ug 6/22   Assessment/ Plan: 1. Incarb umbilical hernia w/ SBO: s/p hernia repair and ex-lap today.  Will need to hold PD for now until  abdomen is healed from surgery.   Please use alternative to morphine for pain control as patient is ESRD 2. ESRD: usually CCPD, HD now due to #1 above. Has new tunneled catheter.  Plan for HD on 7/28. 3. Volume - euvolemic on exam, GI losses > 1L per day per NG.  Will discontinue the continuous fluids given ESRD 4. HTN - acceptable.  RRR on exam to tachy; RN is getting repeat vitals.  5. Anemia ckd - no acute indication for PRBC's.   Will start Aranesp 60 mcg.  6. MBC ckd - hyperphos - off binders post-op for now     Iron/TIBC/Ferritin/ %Sat    Component Value Date/Time   IRON 50 11/12/2017 1333   TIBC 249 (L) 11/12/2017 1333   IRONPCTSAT 20 11/12/2017 1333   Recent Labs  Lab 08/08/18 0631 08/09/18 0348  08/11/18 0443  NA  --  140   < > 141  K  --  3.3*   < > 3.6  CL  --  90*   < > 100  CO2  --  32   < > 27  GLUCOSE  --  137*   < > 104*  BUN  --  89*   < > 69*  CREATININE  --  12.47*   < > 11.23*  CALCIUM  --  9.3   < > 8.0*  ALBUMIN  --  2.9*  --   --   INR 1.0  --   --   --    < > = values in this interval not displayed.   Recent Labs  Lab 08/09/18 0348  AST 14*  ALT 16  ALKPHOS 54  BILITOT 0.7  PROT 5.4*   Recent Labs  Lab 08/11/18 0443  WBC 5.9  HGB 8.8*  HCT 26.5*  PLT 132*     [name] 08/11/2018 5:29 PM

## 2018-08-12 ENCOUNTER — Encounter (HOSPITAL_COMMUNITY): Payer: Self-pay | Admitting: Emergency Medicine

## 2018-08-12 LAB — CBC
HCT: 26.3 % — ABNORMAL LOW (ref 39.0–52.0)
Hemoglobin: 8.6 g/dL — ABNORMAL LOW (ref 13.0–17.0)
MCH: 30.6 pg (ref 26.0–34.0)
MCHC: 32.7 g/dL (ref 30.0–36.0)
MCV: 93.6 fL (ref 80.0–100.0)
Platelets: 160 10*3/uL (ref 150–400)
RBC: 2.81 MIL/uL — ABNORMAL LOW (ref 4.22–5.81)
RDW: 12.3 % (ref 11.5–15.5)
WBC: 9.5 10*3/uL (ref 4.0–10.5)
nRBC: 0 % (ref 0.0–0.2)

## 2018-08-12 LAB — BASIC METABOLIC PANEL
Anion gap: 18 — ABNORMAL HIGH (ref 5–15)
BUN: 88 mg/dL — ABNORMAL HIGH (ref 6–20)
CO2: 20 mmol/L — ABNORMAL LOW (ref 22–32)
Calcium: 7.5 mg/dL — ABNORMAL LOW (ref 8.9–10.3)
Chloride: 103 mmol/L (ref 98–111)
Creatinine, Ser: 12.27 mg/dL — ABNORMAL HIGH (ref 0.61–1.24)
GFR calc Af Amer: 5 mL/min — ABNORMAL LOW (ref >60)
GFR calc non Af Amer: 4 mL/min — ABNORMAL LOW (ref >60)
Glucose, Bld: 152 mg/dL — ABNORMAL HIGH (ref 70–99)
Potassium: 4.4 mmol/L (ref 3.5–5.1)
Sodium: 141 mmol/L (ref 135–145)

## 2018-08-12 LAB — GLUCOSE, CAPILLARY
Glucose-Capillary: 134 mg/dL — ABNORMAL HIGH (ref 70–99)
Glucose-Capillary: 125 mg/dL — ABNORMAL HIGH (ref 70–99)
Glucose-Capillary: 225 mg/dL — ABNORMAL HIGH (ref 70–99)
Glucose-Capillary: 266 mg/dL — ABNORMAL HIGH (ref 70–99)
Glucose-Capillary: 166 mg/dL — ABNORMAL HIGH (ref 70–99)

## 2018-08-12 MED ORDER — HEPARIN SODIUM (PORCINE) 5000 UNIT/ML IJ SOLN
5000.0000 [IU] | Freq: Three times a day (TID) | INTRAMUSCULAR | Status: AC
  Administered 2018-08-12: 5000 [IU] via SUBCUTANEOUS
  Filled 2018-08-12: qty 1

## 2018-08-12 MED ORDER — HEPARIN SODIUM (PORCINE) 1000 UNIT/ML IJ SOLN
INTRAMUSCULAR | Status: AC
  Administered 2018-08-12: 11:00:00
  Filled 2018-08-12: qty 4

## 2018-08-12 MED ORDER — DARBEPOETIN ALFA 60 MCG/0.3ML IJ SOSY
PREFILLED_SYRINGE | INTRAMUSCULAR | Status: AC
  Filled 2018-08-12: qty 0.3

## 2018-08-12 MED ORDER — ACETAMINOPHEN 10 MG/ML IV SOLN
1000.0000 mg | Freq: Four times a day (QID) | INTRAVENOUS | Status: AC
  Administered 2018-08-12 – 2018-08-13 (×4): 1000 mg via INTRAVENOUS
  Filled 2018-08-12 (×4): qty 100

## 2018-08-12 NOTE — Progress Notes (Signed)
Foley cath discontinued as ordered.  NG clamped as ordered

## 2018-08-12 NOTE — Progress Notes (Signed)
Dr Daleen Bo was notified via chat message that the patient refused his coverage of Novolog insulin 2 units.  The patient reported that he was afraid that it would drop his glucose too low.

## 2018-08-12 NOTE — Progress Notes (Addendum)
1 Day Post-Op  Subjective: CC: Abdominal pain Patient reports diffuse abdominal pain. No nausea. Had 2 episodes of flatus this morning. No BM. Just underwent dialysis this AM.   Objective: Vital signs in last 24 hours: Temp:  [98.4 F (36.9 C)-99.5 F (37.5 C)] 98.4 F (36.9 C) (07/28 1054) Pulse Rate:  [80-100] 89 (07/28 1054) Resp:  [12-19] 16 (07/28 1054) BP: (98-145)/(49-76) 124/72 (07/28 1054) SpO2:  [97 %-100 %] 100 % (07/28 1054) Weight:  [85.5 kg-86 kg] 85.5 kg (07/28 1032) Last BM Date: 08/10/18  Intake/Output from previous day: 07/27 0701 - 07/28 0700 In: 1400 [I.V.:1000; IV Piggyback:400] Out: 1405 [Urine:725; Emesis/NG output:425; Drains:155; Blood:100] Intake/Output this shift: Total I/O In: -  Out: 501 [Other:501]  PE: Gen: Awake and alert, NAD Lungs: Normal rate and effort Abd: Soft, ND, generalized tenderness that is appropriate. Hypoactive bowel sounds. Left JP drain with bloody SS fluid. 155cc/24 hours. Midline honeycomb dressing in place with staples below. Wound appears c/d/i. NGT in place on LIWS. 100cc fluid in cannister, 425 recorded/24 hours.  Msk: no edema   Lab Results:  Recent Labs    08/11/18 0443 08/12/18 0422  WBC 5.9 9.5  HGB 8.8* 8.6*  HCT 26.5* 26.3*  PLT 132* 160   BMET Recent Labs    08/11/18 0443 08/12/18 0422  NA 141 141  K 3.6 4.4  CL 100 103  CO2 27 20*  GLUCOSE 104* 152*  BUN 69* 88*  CREATININE 11.23* 12.27*  CALCIUM 8.0* 7.5*   PT/INR No results for input(s): LABPROT, INR in the last 72 hours. CMP     Component Value Date/Time   NA 141 08/12/2018 0422   K 4.4 08/12/2018 0422   CL 103 08/12/2018 0422   CO2 20 (L) 08/12/2018 0422   GLUCOSE 152 (H) 08/12/2018 0422   BUN 88 (H) 08/12/2018 0422   CREATININE 12.27 (H) 08/12/2018 0422   CALCIUM 7.5 (L) 08/12/2018 0422   PROT 5.4 (L) 08/09/2018 0348   ALBUMIN 2.9 (L) 08/09/2018 0348   AST 14 (L) 08/09/2018 0348   ALT 16 08/09/2018 0348   ALKPHOS 54  08/09/2018 0348   BILITOT 0.7 08/09/2018 0348   GFRNONAA 4 (L) 08/12/2018 0422   GFRAA 5 (L) 08/12/2018 0422   Lipase     Component Value Date/Time   LIPASE 27 08/07/2018 2336       Studies/Results: No results found.  Anti-infectives: Anti-infectives (From admission, onward)   Start     Dose/Rate Route Frequency Ordered Stop   08/11/18 1156  polymyxin B 500,000 Units, bacitracin 50,000 Units in sodium chloride 0.9 % 500 mL irrigation  Status:  Discontinued       As needed 08/11/18 1156 08/11/18 1239   08/11/18 0700  ceFAZolin (ANCEF) IVPB 2g/100 mL premix     2 g 200 mL/hr over 30 Minutes Intravenous To ShortStay Surgical 08/10/18 2018 08/11/18 0857   08/11/18 0600  ceFAZolin (ANCEF) powder 2 g  Status:  Discontinued     2 g Other On call to O.R. 08/10/18 2010 08/10/18 2017   08/08/18 1645  ceFAZolin (ANCEF) IVPB 2g/100 mL premix     2 g 200 mL/hr over 30 Minutes Intravenous  Once 08/08/18 1632 08/08/18 1702   08/08/18 1620  ceFAZolin (ANCEF) 2-4 GM/100ML-% IVPB    Note to Pharmacy: Desiree Hane   : cabinet override      08/08/18 1620 08/09/18 0429       Assessment/Plan  Type 1  DM HTN ESRD on PD - currently on HD here  SBO secondary to incarcerated incisional hernia S/p Ex Lap, LOA x 30 min, open retrorectus repair of incarcerated incisional hernia with bilateral posterior component separation, insertion Phasix mesh - POD 1 - Remove foley - Clamp NG tube - Pain control  FEN -Clamp NG tube, IVFs VTE -SCDs, Heparin (Hgb stable)  ID -Ancef periop Foley - Removed POD 1 Follow-up: Dr. Redmond Pulling  LOS: 2 days    LOS: 4 days    Jillyn Ledger , [facility] 08/12/2018, 11:27 AM Pager: [phone]

## 2018-08-12 NOTE — Progress Notes (Signed)
Renal Navigator received message from [facility] that they are not able to accommodate patient for incenter HD treatment at this time. Renal Navigator informed patient of this and that he will be referred to [facility] or [facility], which are equidistant from his home. He requests SW as this is the area where his parents live. Renal Navigator notified [employer].  Alphonzo Cruise, [place] Renal Navigator [phone]

## 2018-08-12 NOTE — Progress Notes (Signed)
[facility] PROGRESS NOTE  [facility] EOF:[id number] DOB: 27-Aug-1970 DOA: 08/07/2018 PCP: Haywood Pao, MD  Brief summary   48 year old male with past medical history of diabetes mellitus type 1, hypertension, end-stage renal disease on peritoneal dialysis.  Patient presented to the ED for evaluation of abdominal pain.  Patient was not noted to have a large abdominal hernia, upon ED evaluation CT of the abdomen showed high-grade SBO involving PD catheter.  Patient admitted with working diagnosis of SBO and hemodialysis  Assessment/Plan:  Incarcerated periumbilical hernia/high-grade SBO. On NG tube. Underwent exploratory lap/lysis adhesions, repair of incarcerated hernia on 7/27. continue management per GS.  General surgery recommendations appreciated. Will start gentle fluids if remains NPO  End-stage renal disease on CCPD. On peritoneal dialysis at home. Given the presentation with high-grade SBO requiring surgical correction office incisional hernia patient will not be able to do PD until the wound heals. Therefore patient will transition to HD. HD catheter placed.  -HD planned for 7/28. Renal recommendations appreciated  Diabetes mellitus type 1, controlled with renal complication. Hold home Lantus while NPO. Cont sliding scale.  Patient n.p.o. monitor glucose closely and avoid hypoglycemia.  Hypertension. BP stable, continue amlodipine and metoprolol.  Holding Lasix and ibesartan given that was awaiting for hemodialysis.  Resume if needed  Code Status: full Family Communication: d/w patient, RN (indicate person spoken with, relationship, and if by phone, the number) Disposition Plan: remains inpatient    Consultants:  Surgery   Procedures:  Hernia repair 7/27  Antibiotics: Anti-infectives (From admission, onward)   Start     Dose/Rate Route Frequency Ordered Stop   08/11/18 1156  polymyxin B 500,000 Units, bacitracin 50,000 Units in sodium chloride 0.9 %  500 mL irrigation  Status:  Discontinued       As needed 08/11/18 1156 08/11/18 1239   08/11/18 0700  ceFAZolin (ANCEF) IVPB 2g/100 mL premix     2 g 200 mL/hr over 30 Minutes Intravenous To ShortStay Surgical 08/10/18 2018 08/11/18 0857   08/11/18 0600  ceFAZolin (ANCEF) powder 2 g  Status:  Discontinued     2 g Other On call to O.R. 08/10/18 2010 08/10/18 2017   08/08/18 1645  ceFAZolin (ANCEF) IVPB 2g/100 mL premix     2 g 200 mL/hr over 30 Minutes Intravenous  Once 08/08/18 1632 08/08/18 1702   08/08/18 1620  ceFAZolin (ANCEF) 2-4 GM/100ML-% IVPB    Note to Pharmacy: Desiree Hane   : cabinet override      08/08/18 1620 08/09/18 0429       (indicate start date, and stop date if known)  HPI/Subjective: Reports nauea. still with NG tube 400cc output.  No distress. Seen in HD  Objective: Vitals:   08/12/18 1032 08/12/18 1054  BP: 120/67 124/72  Pulse: 81 89  Resp: 16 16  Temp: 98.7 F (37.1 C) 98.4 F (36.9 C)  SpO2: 100% 100%    Intake/Output Summary (Last 24 hours) at 08/12/2018 1135 Last data filed at 08/12/2018 1032 Gross per 24 hour  Intake 900 ml  Output 1681 ml  Net -781 ml   Filed Weights   08/11/18 0500 08/12/18 0730 08/12/18 1032  Weight: 85.8 kg 86 kg 85.5 kg    Exam:   General:  No distress   Cardiovascular: s1,s2 rrr  Respiratory: CTA BL  Abdomen: mild post op tender   Musculoskeletal: no leg edema    Data Reviewed: Basic Metabolic Panel: Recent Labs  Lab 08/07/18 2336 08/09/18 0348  08/10/18 0456 08/11/18 0443 08/12/18 0422  NA 136 140 138 141 141  K 3.6 3.3* 3.7 3.6 4.4  CL 96* 90* 98 100 103  CO2 27 32 28 27 20*  GLUCOSE 263* 137* 142* 104* 152*  BUN 75* 89* 50* 69* 88*  CREATININE 10.16* 12.47* 9.63* 11.23* 12.27*  CALCIUM 9.4 9.3 8.1* 8.0* 7.5*   Liver Function Tests: Recent Labs  Lab 08/07/18 2336 08/09/18 0348  AST 17 14*  ALT 23 16  ALKPHOS 66 54  BILITOT 0.6 0.7  PROT 5.6* 5.4*  ALBUMIN 3.2* 2.9*   Recent  Labs  Lab 08/07/18 2336  LIPASE 27   No results for input(s): AMMONIA in the last 168 hours. CBC: Recent Labs  Lab 08/07/18 2336 08/09/18 0348 08/10/18 0456 08/11/18 0443 08/12/18 0422  WBC 9.1 9.5 7.4 5.9 9.5  HGB 9.8* 9.8* 8.9* 8.8* 8.6*  HCT 28.5* 29.3* 27.1* 26.5* 26.3*  MCV 89.6 91.8 93.4 92.3 93.6  PLT 194 165 133* 132* 160   Cardiac Enzymes: No results for input(s): CKTOTAL, CKMB, CKMBINDEX, TROPONINI in the last 168 hours. BNP (last 3 results) No results for input(s): BNP in the last 8760 hours.  ProBNP (last 3 results) No results for input(s): PROBNP in the last 8760 hours.  CBG: Recent Labs  Lab 08/11/18 1621 08/11/18 2043 08/11/18 2349 08/12/18 0429 08/12/18 1052  GLUCAP 174* 133* 118* 134* 125*    Recent Results (from the past 240 hour(s))  SARS Coronavirus 2 (CEPHEID - Performed in [place] hospital lab), Hosp Order     Status: None   Collection Time: 08/08/18  5:12 AM   Specimen: Nasopharyngeal Swab  Result Value Ref Range Status   SARS Coronavirus 2 NEGATIVE NEGATIVE Final    Comment: (NOTE) If result is NEGATIVE SARS-CoV-2 target nucleic acids are NOT DETECTED. The SARS-CoV-2 RNA is generally detectable in upper and lower  respiratory specimens during the acute phase of infection. The lowest  concentration of SARS-CoV-2 viral copies this assay can detect is 250  copies / mL. A negative result does not preclude SARS-CoV-2 infection  and should not be used as the sole basis for treatment or other  patient management decisions.  A negative result may occur with  improper specimen collection / handling, submission of specimen other  than nasopharyngeal swab, presence of viral mutation(s) within the  areas targeted by this assay, and inadequate number of viral copies  (<250 copies / mL). A negative result must be combined with clinical  observations, patient history, and epidemiological information. If result is POSITIVE SARS-CoV-2 target  nucleic acids are DETECTED. The SARS-CoV-2 RNA is generally detectable in upper and lower  respiratory specimens dur ing the acute phase of infection.  Positive  results are indicative of active infection with SARS-CoV-2.  Clinical  correlation with patient history and other diagnostic information is  necessary to determine patient infection status.  Positive results do  not rule out bacterial infection or co-infection with other viruses. If result is PRESUMPTIVE POSTIVE SARS-CoV-2 nucleic acids MAY BE PRESENT.   A presumptive positive result was obtained on the submitted specimen  and confirmed on repeat testing.  While 2019 novel coronavirus  (SARS-CoV-2) nucleic acids may be present in the submitted sample  additional confirmatory testing may be necessary for epidemiological  and / or clinical management purposes  to differentiate between  SARS-CoV-2 and other Sarbecovirus currently known to infect humans.  If clinically indicated additional testing with an alternate test  methodology (  [id number]) is advised. The SARS-CoV-2 RNA is generally  detectable in upper and lower respiratory sp ecimens during the acute  phase of infection. The expected result is Negative. Fact Sheet for Patients:  StrictlyIdeas.no Fact Sheet for Healthcare Providers: BankingDealers.co.za This test is not yet approved or cleared by the [place] FDA and has been authorized for detection and/or diagnosis of SARS-CoV-2 by FDA under an Emergency Use Authorization (EUA).  This EUA will remain in effect (meaning this test can be used) for the duration of the COVID-19 declaration under Section 564(b)(1) of the Act, 21 U.S.C. section 360bbb-3(b)(1), unless the authorization is terminated or revoked sooner. Performed at [facility], [place] [address] [place], [place] [address]   MRSA PCR Screening     Status: None   Collection Time: 08/10/18  6:31 PM    Specimen: Nasal Mucosa; Nasopharyngeal  Result Value Ref Range Status   MRSA by PCR NEGATIVE NEGATIVE Final    Comment:        The GeneXpert MRSA Assay (FDA approved for NASAL specimens only), is one component of a comprehensive MRSA colonization surveillance program. It is not intended to diagnose MRSA infection nor to guide or monitor treatment for MRSA infections. Performed at [facility], [place] [address], [place], [place] [address]      Studies: No results found.  Scheduled Meds: . Chlorhexidine Gluconate Cloth  6 each Topical Q0600  . Chlorhexidine Gluconate Cloth  6 each Topical Q0600  . darbepoetin (ARANESP) injection - DIALYSIS  60 mcg Intravenous Q Tue-HD  . insulin aspart  0-15 Units Subcutaneous Q4H  . insulin glargine  5 Units Subcutaneous QHS  . metoprolol tartrate  2.5 mg Intravenous Q6H  . pantoprazole (PROTONIX) IV  40 mg Intravenous Q24H   Continuous Infusions: . methocarbamol (ROBAXIN) IV 1,000 mg (08/12/18 0545)    Principal Problem:   Abdominal hernia as complication of peritoneal dialysis Active Problems:   Type 1 diabetes mellitus ([place])   ESRD (end stage renal disease) ([place])   HTN (hypertension)   SBO (small bowel obstruction) ([place])   Periumbilical hernia    Time spent: >25 minutes     Kinnie Feil  [facility] Pager [phone]. If 7PM-7AM, please contact night-coverage at [email or web address], password [facility] 08/12/2018, 11:35 AM  LOS: 4 days

## 2018-08-12 NOTE — Progress Notes (Signed)
[facility] Progress Note  Subjective:   Seen on HD today at 8:35 with 106/58 and HR 81.  Goal is 500 mL.  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 He lives in [place] and works in [place].  Hasn't been removing his binder.   Vitals:   08/12/18 0730 08/12/18 0732 08/12/18 0800 08/12/18 0830  BP: 128/68 124/69 (!) 104/58 (!) 106/58  Pulse: 86 86 81 81  Resp: 17 19 18 19   Temp: 98.9 F (37.2 C)     TempSrc: Oral     SpO2: 100%     Weight: 86 kg     Height:        Inpatient medications: . Chlorhexidine Gluconate Cloth  6 each Topical Q0600  . Chlorhexidine Gluconate Cloth  6 each Topical Q0600  . darbepoetin (ARANESP) injection - DIALYSIS  60 mcg Intravenous Q Tue-HD  . insulin aspart  0-15 Units Subcutaneous Q4H  . insulin glargine  5 Units Subcutaneous QHS  . metoprolol tartrate  2.5 mg Intravenous Q6H  . pantoprazole (PROTONIX) IV  40 mg Intravenous Q24H   . methocarbamol (ROBAXIN) IV 1,000 mg (08/12/18 0545)   hydrALAZINE, menthol-cetylpyridinium, morphine injection, ondansetron **OR** ondansetron (ZOFRAN) IV, phenol    Exam: Gen alert, no distress; NGT in place No jvd or bruits Chest clear bilat to bases RRR no MRG Abd - abdominal binder is intact and not removed; post-op pt  MS no joint effusions or deformity Ext no LE edema Neuro alert and oriented x 3 Access: RIJ tunneled catheter intact     Home meds:  - amlodipine 10 / clonidine 0.1 tid/ irbesartan 300 qd/ metoprolol xl 25 qd  - insulin aspart SSI tid 0- 10u  - furosemide 80 bid/ sevelamer carb ac tid    Outpt PD: 4 overnight, 2.3 L fill, 1.5 h dwell, no day bag or pause - got venofer 200mg  6/22 and mircera 75 ug 6/22   Assessment/ Plan: 1. Incarb umbilical hernia w/ SBO: s/p hernia repair and ex-lap on 7/27.  Will need to hold PD for now until abdomen is healed from surgery.  Please use alternative to morphine for pain control as  patient is ESRD  2. ESRD: usually CCPD, HD now due to #1 above. Has new tunneled catheter.  HD today with minimal UF.  For now we will have a TTS schedule for him.  I have spoken with dialysis coordinator to set up a spot for outpatient HD but this is not yet arranged. 3. Volume - euvolemic on exam, GI losses > 1L per day per NG.   4. HTN - acceptable.   5. Anemia ckd - no acute indication for PRBC's.   Started Aranesp 60 mcg weekly.  6. MBC ckd - hyperphos - off binders post-op for now.  Check renal panel to include phos tomorrow.     Iron/TIBC/Ferritin/ %Sat    Component Value Date/Time   IRON 50 11/12/2017 1333   TIBC 249 (L) 11/12/2017 1333   IRONPCTSAT 20 11/12/2017 1333   Recent Labs  Lab 08/08/18 0631 08/09/18 0348  08/12/18 0422  NA  --  140   < > 141  K  --  3.3*   < > 4.4  CL  --  90*   < > 103  CO2  --  32   < > 20*  GLUCOSE  --  137*   < > 152*  BUN  --  89*   < > 88*  CREATININE  --  12.47*   < > 12.27*  CALCIUM  --  9.3   < > 7.5*  ALBUMIN  --  2.9*  --   --   INR 1.0  --   --   --    < > = values in this interval not displayed.   Recent Labs  Lab 08/09/18 0348  AST 14*  ALT 16  ALKPHOS 54  BILITOT 0.7  PROT 5.4*   Recent Labs  Lab 08/12/18 0422  WBC 9.5  HGB 8.6*  HCT 26.3*  PLT 160     Alex Morrison 08/12/2018 8:34 AM

## 2018-08-12 NOTE — Progress Notes (Signed)
Renal Navigator received notification from Nephrologist/Dr. Royce Macadamia that patient needs incenter HD due to modality change from PD to HD, which patient hopes is temporary. It is uncertain at this time how long he will need HD. Renal Navigator met with patient at bedside to discuss. Renal Navigator explained that there are [facility] clinics closer to his home than [facility] (where the Home PD program is housed), but patient is certain that he would like to receive HD treatment at [facility].  Renal Navigator completed referral for modality change from PD to HD and submitted to [facility] admissions, requesting treatment at [facility] clinic.  Renal Navigator will follow up with Nephrologist and patient regarding acceptance and seat schedule.  Alphonzo Cruise, [place] Renal Navigator [phone]

## 2018-08-12 NOTE — Progress Notes (Signed)
HD initiated via R Chest HD catheter without issue. Dressing changed. No s/sx of infection. Lungs clear. No notable edema. Patient complains of mild pain 2/10 but is currently sleeping. 3k bath. 1L goal as ordered per MD. Call bell at bedside. Continue to monitor.

## 2018-08-12 NOTE — TOC Initial Note (Signed)
Transition of Care [facility]) - Initial/Assessment Note    Patient Details  Name: Alex Morrison MRN: [id number] Date of Birth: 1970-10-17  [facility] CM/SW Contact:    Bartholomew Crews, RN Phone Number: [phone] 08/12/2018, 3:47 PM  Clinical Narrative:                 Spoke with patient at the bedside. PTA home with brother. Parents live nearby. Independent with adls and Iadls - on PD. S/p exp lap - NGT in place. Patient states that he will need to be on HD for at least 3 months, but he is hopeful to return to PD at some point. States if he is unable to drive self to HD that his brother or parents would assist. Continues to independent with adls. No hh or dme needs identified at this time. Renal navigator assisting with outpatient HD placement. CM to follow for transition of care needs.   Expected Discharge Plan: Home/Self Care Barriers to Discharge: Continued Medical Work up   Patient Goals and CMS Choice Patient states their goals for this hospitalization and ongoing recovery are:: return home CMS Medicare.gov Compare Post Acute Care list provided to:: Patient    Expected Discharge Plan and Services Expected Discharge Plan: Home/Self Care In-house Referral: Clinical Social Work Discharge Planning Services: CM Consult Post Acute Care Choice: Dialysis                   DME Arranged: N/A DME Agency: NA       HH Arranged: NA HH Agency: NA        Prior Living Arrangements/Services   Lives with:: Self, Siblings Patient language and need for interpreter reviewed:: Yes Do you feel safe going back to the place where you live?: Yes            Criminal Activity/Legal Involvement Pertinent to Current Situation/Hospitalization: No - Comment as needed  Activities of Daily Living Home Assistive Devices/Equipment: None ADL Screening (condition at time of admission) Patient's cognitive ability adequate to safely complete daily activities?: Yes Is the patient deaf or  have difficulty hearing?: No Does the patient have difficulty seeing, even when wearing glasses/contacts?: No Does the patient have difficulty concentrating, remembering, or making decisions?: No Patient able to express need for assistance with ADLs?: Yes Does the patient have difficulty dressing or bathing?: No Independently performs ADLs?: Yes (appropriate for developmental age) Does the patient have difficulty walking or climbing stairs?: No Weakness of Legs: None Weakness of Arms/Hands: None  Permission Sought/Granted                  Emotional Assessment Appearance:: Appears stated age Attitude/Demeanor/Rapport: Engaged Affect (typically observed): Accepting Orientation: : Oriented to Self, Oriented to Place, Oriented to  Time, Oriented to Situation Alcohol / Substance Use: Not Applicable Psych Involvement: No (comment)  Admission diagnosis:  Small bowel obstruction ([place]) [K56.609] ESRD (end stage renal disease) ([place]) [N18.6] Encounter for nasogastric (NG) tube placement [Z46.59] Patient Active Problem List   Diagnosis Date Noted  . ESRD (end stage renal disease) ([place]) 08/08/2018  . HTN (hypertension) 08/08/2018  . SBO (small bowel obstruction) ([place]) 08/08/2018  . Periumbilical hernia 94/70/9628  . Abdominal hernia as complication of peritoneal dialysis 08/08/2018  . Psoriasis 09/29/2013  . Acute gangrenous appendicitis with perforation and peritonitis 09/27/2013  . Type 1 diabetes mellitus ([place]) 09/27/2013  . Acute kidney injury ([place]) 09/27/2013  . DM (diabetes mellitus) type 2, uncontrolled, with ketoacidosis ([place]) 07/17/2012  .  DKA (diabetic ketoacidosis) ([place]) 07/16/2012  . Perirectal abscess 07/14/2012   PCP:  Haywood Pao, MD Pharmacy:   [id number] - [place], [address] [place] [address] Phone: [phone] Fax: [phone]     Social Determinants of Health (SDOH) Interventions    Readmission  Risk Interventions No flowsheet data found.

## 2018-08-12 NOTE — Progress Notes (Signed)
Dr Daleen Bo returned a note acknowledging the refusal of Insulin.  No new orders noted.

## 2018-08-13 ENCOUNTER — Encounter (HOSPITAL_COMMUNITY): Payer: Self-pay | Admitting: General Practice

## 2018-08-13 LAB — RENAL FUNCTION PANEL
Albumin: 2.5 g/dL — ABNORMAL LOW (ref 3.5–5.0)
Anion gap: 15 (ref 5–15)
BUN: 61 mg/dL — ABNORMAL HIGH (ref 6–20)
CO2: 24 mmol/L (ref 22–32)
Calcium: 7.6 mg/dL — ABNORMAL LOW (ref 8.9–10.3)
Chloride: 99 mmol/L (ref 98–111)
Creatinine, Ser: 9.24 mg/dL — ABNORMAL HIGH (ref 0.61–1.24)
GFR calc Af Amer: 7 mL/min — ABNORMAL LOW (ref >60)
GFR calc non Af Amer: 6 mL/min — ABNORMAL LOW (ref >60)
Glucose, Bld: 136 mg/dL — ABNORMAL HIGH (ref 70–99)
Phosphorus: 7.2 mg/dL — ABNORMAL HIGH (ref 2.5–4.6)
Potassium: 4.3 mmol/L (ref 3.5–5.1)
Sodium: 138 mmol/L (ref 135–145)

## 2018-08-13 LAB — GLUCOSE, CAPILLARY
Glucose-Capillary: 128 mg/dL — ABNORMAL HIGH (ref 70–99)
Glucose-Capillary: 134 mg/dL — ABNORMAL HIGH (ref 70–99)
Glucose-Capillary: 144 mg/dL — ABNORMAL HIGH (ref 70–99)
Glucose-Capillary: 176 mg/dL — ABNORMAL HIGH (ref 70–99)
Glucose-Capillary: 188 mg/dL — ABNORMAL HIGH (ref 70–99)
Glucose-Capillary: 205 mg/dL — ABNORMAL HIGH (ref 70–99)

## 2018-08-13 MED ORDER — ACETAMINOPHEN 500 MG PO TABS
1000.0000 mg | ORAL_TABLET | Freq: Three times a day (TID) | ORAL | Status: DC
  Administered 2018-08-13 – 2018-08-14 (×4): 1000 mg via ORAL
  Filled 2018-08-13 (×4): qty 2

## 2018-08-13 MED ORDER — METHOCARBAMOL 500 MG PO TABS
1000.0000 mg | ORAL_TABLET | Freq: Three times a day (TID) | ORAL | Status: DC
  Administered 2018-08-13 – 2018-08-14 (×3): 1000 mg via ORAL
  Filled 2018-08-13 (×3): qty 2

## 2018-08-13 MED ORDER — OXYCODONE HCL 5 MG PO TABS: 5.0000 mg | ORAL_TABLET | ORAL | Status: DC | PRN

## 2018-08-13 MED ORDER — INSULIN GLARGINE 100 UNIT/ML ~~LOC~~ SOLN
5.0000 [IU] | Freq: Every day | SUBCUTANEOUS | Status: DC
  Administered 2018-08-13: 5 [IU] via SUBCUTANEOUS
  Filled 2018-08-13 (×2): qty 0.05

## 2018-08-13 MED ORDER — PANTOPRAZOLE SODIUM 40 MG PO TBEC
40.0000 mg | DELAYED_RELEASE_TABLET | Freq: Every day | ORAL | Status: DC
  Administered 2018-08-13: 40 mg via ORAL
  Filled 2018-08-13: qty 1

## 2018-08-13 NOTE — Progress Notes (Signed)
[facility] PROGRESS NOTE  Alex Morrison [facility] WHQ:[id number] DOB: August 07, 1970 DOA: 08/07/2018 PCP: Haywood Pao, MD  Brief summary  48 year old male with with type 1 diabetes mellitus, hypertension, ESRD on peritoneal dialysis was admitted with a high-grade small bowel obstruction, incarcerated incisional hernia. -Seen by general surgery, underwent ex lap, LOA, repair of incarcerated incisional hernia by Dr. Redmond Pulling on 7/27 -Also followed by nephrology, HD catheter placed and started on hemodialysis 7/28  Assessment/Plan:  Incarcerated periumbilical hernia/high-grade SBO.  -Clinically improving, per general surgery,  -Underwent exploratory lap/lysis adhesions, repair of incarcerated hernia on 7/27.  -NG tube removed this morning, advance diet as tolerated -Increase activity, ambulation -Labs in a.m.  End-stage renal disease on CCPD.  -On peritoneal dialysis at home.  -Given the presentation with high-grade SBO requiring surgical correction of incisional hernia,  -patient will not be able to do PD until the wound heals, HD catheter placed, started on hemodialysis -Renal following, pending clip  Diabetes mellitus type 1, controlled with renal complication. -Starting clear liquids today, since type I diabetic will restart Lantus at low-dose 5 units daily -Sliding scale insulin, titrate dose further as diet is advanced   Hypertension.  -Stable, continue IV beta-blocker and hydralazine now  -As diet is advanced, if tolerates diet, resume home BP meds  DVT prophylaxis: Heparin subcutaneous  Code Status: full Family Communication: Discussed with patient, no family at bedside Disposition Plan: remains inpatient    Consultants:  Surgery   Procedures:  Ex lap, LOA, incisional hernia repair 7/27  7/24, tunneled right IJ HD catheter  Antibiotics: Anti-infectives (From admission, onward)   Start     Dose/Rate Route Frequency Ordered Stop   08/11/18 1156  polymyxin B  500,000 Units, bacitracin 50,000 Units in sodium chloride 0.9 % 500 mL irrigation  Status:  Discontinued       As needed 08/11/18 1156 08/11/18 1239   08/11/18 0700  ceFAZolin (ANCEF) IVPB 2g/100 mL premix     2 g 200 mL/hr over 30 Minutes Intravenous To ShortStay Surgical 08/10/18 2018 08/11/18 0857   08/11/18 0600  ceFAZolin (ANCEF) powder 2 g  Status:  Discontinued     2 g Other On call to O.R. 08/10/18 2010 08/10/18 2017   08/08/18 1645  ceFAZolin (ANCEF) IVPB 2g/100 mL premix     2 g 200 mL/hr over 30 Minutes Intravenous  Once 08/08/18 1632 08/08/18 1702   08/08/18 1620  ceFAZolin (ANCEF) 2-4 GM/100ML-% IVPB    Note to Pharmacy: Desiree Hane   : cabinet override      08/08/18 1620 08/09/18 0429       (indicate start date, and stop date if known)  HPI/Subjective: -Feels better, had a BM last night, NG tube just removed, very mild abdominal discomfort only  Objective: Vitals:   08/13/18 0438 08/13/18 0804  BP: 139/76 118/69  Pulse: 85 78  Resp: 18 20  Temp: 97.8 F (36.6 C) 98.3 F (36.8 C)  SpO2: 100% 99%    Intake/Output Summary (Last 24 hours) at 08/13/2018 1131 Last data filed at 08/13/2018 0500 Gross per 24 hour  Intake 860 ml  Output 25 ml  Net 835 ml   Filed Weights   08/12/18 0730 08/12/18 1032 08/13/18 0500  Weight: 86 kg 85.5 kg 87.7 kg    Exam:  Gen: Awake, Alert, Oriented X 3, pleasant male, sitting up in bed, no distress, NG tube just removed HEENT: Oral mucosa dry Lungs: Decreased breath sounds at both bases otherwise clear  CVS: S1-S2/regular rate rhythm Abd: Soft, mildly distended, surgical incisions noted, JP drain, PD catheter noted, abdominal binder Extremities: no edema, right IJ HD catheter Skin: no new rashes   Data Reviewed: Basic Metabolic Panel: Recent Labs  Lab 08/09/18 0348 08/10/18 0456 08/11/18 0443 08/12/18 0422 08/13/18 0334  NA 140 138 141 141 138  K 3.3* 3.7 3.6 4.4 4.3  CL 90* 98 100 103 99  CO2 32 28 27 20* 24   GLUCOSE 137* 142* 104* 152* 136*  BUN 89* 50* 69* 88* 61*  CREATININE 12.47* 9.63* 11.23* 12.27* 9.24*  CALCIUM 9.3 8.1* 8.0* 7.5* 7.6*  PHOS  --   --   --   --  7.2*   Liver Function Tests: Recent Labs  Lab 08/07/18 2336 08/09/18 0348 08/13/18 0334  AST 17 14*  --   ALT 23 16  --   ALKPHOS 66 54  --   BILITOT 0.6 0.7  --   PROT 5.6* 5.4*  --   ALBUMIN 3.2* 2.9* 2.5*   Recent Labs  Lab 08/07/18 2336  LIPASE 27   No results for input(s): AMMONIA in the last 168 hours. CBC: Recent Labs  Lab 08/07/18 2336 08/09/18 0348 08/10/18 0456 08/11/18 0443 08/12/18 0422  WBC 9.1 9.5 7.4 5.9 9.5  HGB 9.8* 9.8* 8.9* 8.8* 8.6*  HCT 28.5* 29.3* 27.1* 26.5* 26.3*  MCV 89.6 91.8 93.4 92.3 93.6  PLT 194 165 133* 132* 160   Cardiac Enzymes: No results for input(s): CKTOTAL, CKMB, CKMBINDEX, TROPONINI in the last 168 hours. BNP (last 3 results) No results for input(s): BNP in the last 8760 hours.  ProBNP (last 3 results) No results for input(s): PROBNP in the last 8760 hours.  CBG: Recent Labs  Lab 08/12/18 1658 08/12/18 2019 08/13/18 0019 08/13/18 0436 08/13/18 0802  GLUCAP 266* 166* 188* 128* 134*    Recent Results (from the past 240 hour(s))  SARS Coronavirus 2 (CEPHEID - Performed in [place] hospital lab), Hosp Order     Status: None   Collection Time: 08/08/18  5:12 AM   Specimen: Nasopharyngeal Swab  Result Value Ref Range Status   SARS Coronavirus 2 NEGATIVE NEGATIVE Final    Comment: (NOTE) If result is NEGATIVE SARS-CoV-2 target nucleic acids are NOT DETECTED. The SARS-CoV-2 RNA is generally detectable in upper and lower  respiratory specimens during the acute phase of infection. The lowest  concentration of SARS-CoV-2 viral copies this assay can detect is 250  copies / mL. A negative result does not preclude SARS-CoV-2 infection  and should not be used as the sole basis for treatment or other  patient management decisions.  A negative result may occur  with  improper specimen collection / handling, submission of specimen other  than nasopharyngeal swab, presence of viral mutation(s) within the  areas targeted by this assay, and inadequate number of viral copies  (<250 copies / mL). A negative result must be combined with clinical  observations, patient history, and epidemiological information. If result is POSITIVE SARS-CoV-2 target nucleic acids are DETECTED. The SARS-CoV-2 RNA is generally detectable in upper and lower  respiratory specimens dur ing the acute phase of infection.  Positive  results are indicative of active infection with SARS-CoV-2.  Clinical  correlation with patient history and other diagnostic information is  necessary to determine patient infection status.  Positive results do  not rule out bacterial infection or co-infection with other viruses. If result is PRESUMPTIVE POSTIVE SARS-CoV-2 nucleic acids MAY BE  PRESENT.   A presumptive positive result was obtained on the submitted specimen  and confirmed on repeat testing.  While 2019 novel coronavirus  (SARS-CoV-2) nucleic acids may be present in the submitted sample  additional confirmatory testing may be necessary for epidemiological  and / or clinical management purposes  to differentiate between  SARS-CoV-2 and other Sarbecovirus currently known to infect humans.  If clinically indicated additional testing with an alternate test  methodology [id number]) is advised. The SARS-CoV-2 RNA is generally  detectable in upper and lower respiratory sp ecimens during the acute  phase of infection. The expected result is Negative. Fact Sheet for Patients:  StrictlyIdeas.no Fact Sheet for Healthcare Providers: BankingDealers.co.za This test is not yet approved or cleared by the [place] FDA and has been authorized for detection and/or diagnosis of SARS-CoV-2 by FDA under an Emergency Use Authorization (EUA).  This EUA  will remain in effect (meaning this test can be used) for the duration of the COVID-19 declaration under Section 564(b)(1) of the Act, 21 U.S.C. section 360bbb-3(b)(1), unless the authorization is terminated or revoked sooner. Performed at [facility], [place] [address], [place], [place] [address]   MRSA PCR Screening     Status: None   Collection Time: 08/10/18  6:31 PM   Specimen: Nasal Mucosa; Nasopharyngeal  Result Value Ref Range Status   MRSA by PCR NEGATIVE NEGATIVE Final    Comment:        The GeneXpert MRSA Assay (FDA approved for NASAL specimens only), is one component of a comprehensive MRSA colonization surveillance program. It is not intended to diagnose MRSA infection nor to guide or monitor treatment for MRSA infections. Performed at [facility], [place] [address], [place], [place] [address]      Studies: No results found.  Scheduled Meds: . acetaminophen  1,000 mg Oral Q8H  . Chlorhexidine Gluconate Cloth  6 each Topical Q0600  . Chlorhexidine Gluconate Cloth  6 each Topical Q0600  . darbepoetin (ARANESP) injection - DIALYSIS  60 mcg Intravenous Q Tue-HD  . insulin aspart  0-15 Units Subcutaneous Q4H  . metoprolol tartrate  2.5 mg Intravenous Q6H  . pantoprazole (PROTONIX) IV  40 mg Intravenous Q24H   Continuous Infusions: . methocarbamol (ROBAXIN) IV 1,000 mg (08/13/18 0533)    Principal Problem:   Abdominal hernia as complication of peritoneal dialysis Active Problems:   Type 1 diabetes mellitus (HCC)   ESRD (end stage renal disease) (HCC)   HTN (hypertension)   SBO (small bowel obstruction) ([place])   Periumbilical hernia    Time spent: >25 minutes     Domenic Polite  [facility] 08/13/2018, 11:31 AM  LOS: 5 days

## 2018-08-13 NOTE — Progress Notes (Signed)
Pt refused RN to check dressing under abdominal binder. RN reported this to the on-coming RN.

## 2018-08-13 NOTE — Progress Notes (Signed)
[facility] Progress Note  Subjective:   Patient was set up for outpatient HD on MWF at [facility] HD unit.  NG tube was just removed and he started clears this AM.  Spoke with team and they anticipate 1-2 more days inpatient based on how he does.  They are starting home some of his home blood pressure medications tomorrow.   Review of systems:  Has just started PO  No shortness of breath  Up in chair    Vitals:   08/12/18 2019 08/13/18 0438 08/13/18 0500 08/13/18 0804  BP: 105/68 139/76  118/69  Pulse: 80 85  78  Resp: 16 18  20   Temp: 98.2 F (36.8 C) 97.8 F (36.6 C)  98.3 F (36.8 C)  TempSrc: Oral Oral  Oral  SpO2: 100% 100%  99%  Weight:   87.7 kg   Height:        Inpatient medications: . acetaminophen  1,000 mg Oral Q8H  . Chlorhexidine Gluconate Cloth  6 each Topical Q0600  . Chlorhexidine Gluconate Cloth  6 each Topical Q0600  . darbepoetin (ARANESP) injection - DIALYSIS  60 mcg Intravenous Q Tue-HD  . insulin aspart  0-15 Units Subcutaneous Q4H  . insulin glargine  5 Units Subcutaneous Daily  . metoprolol tartrate  2.5 mg Intravenous Q6H  . pantoprazole (PROTONIX) IV  40 mg Intravenous Q24H   . methocarbamol (ROBAXIN) IV 1,000 mg (08/13/18 0533)   hydrALAZINE, menthol-cetylpyridinium, morphine injection, ondansetron **OR** ondansetron (ZOFRAN) IV, phenol    Exam: Gen alert, no distress No jvd or bruits Chest clear bilat to bases RRR no MRG Abd - abdominal binder is intact; jp drain in place MS no joint effusions or deformity Ext no LE edema Neuro alert and oriented x 3 Access: RIJ tunneled catheter intact     Home meds:  - amlodipine 10 / clonidine 0.1 tid/ irbesartan 300 qd/ metoprolol xl 25 qd  - insulin aspart SSI tid 0- 10u  - furosemide 80 bid/ sevelamer carb ac tid    Outpt PD: 4 overnight, 2.3 L fill, 1.5 h dwell, no day bag or pause - got venofer 200mg  6/22 and mircera 75 ug 6/22   Assessment/  Plan: 1. Incarcerated umbilical hernia w/ SBO: s/p hernia repair and ex-lap on 7/27.  Will need to hold PD for now until abdomen is healed from surgery.  Please use alternative to morphine for pain control as patient is ESRD  2. ESRD: usually CCPD, HD now due to #1 above. Has new tunneled catheter.  Outpatient spot has been set up MWF at [facility] (he is able to start there as early as 7/31).  Transition to MWF schedule for HD.   3. Volume - euvolemic on exam 4. HTN - acceptable.  Still off of his home meds.  Team is transitioning to his oral beta blocker tomorrow; on multiple anti-hypertensives at home but he has been off of orals here and on IV scheduled meds and PRN.  His anti-hypertensive requirement has been significantly reduced.   5. Anemia ckd - no acute indication for PRBC's.   Started Aranesp 60 mcg weekly.  6. MBC ckd - hyperphos - resume binders when able     Iron/TIBC/Ferritin/ %Sat    Component Value Date/Time   IRON 50 11/12/2017 1333   TIBC 249 (L) 11/12/2017 1333   IRONPCTSAT 20 11/12/2017 1333   Recent Labs  Lab 08/08/18 0631  08/13/18 0334  NA  --    < >  138  K  --    < > 4.3  CL  --    < > 99  CO2  --    < > 24  GLUCOSE  --    < > 136*  BUN  --    < > 61*  CREATININE  --    < > 9.24*  CALCIUM  --    < > 7.6*  PHOS  --   --  7.2*  ALBUMIN  --    < > 2.5*  INR 1.0  --   --    < > = values in this interval not displayed.   Recent Labs  Lab 08/09/18 0348  AST 14*  ALT 16  ALKPHOS 54  BILITOT 0.7  PROT 5.4*   Recent Labs  Lab 08/12/18 0422  WBC 9.5  HGB 8.6*  HCT 26.3*  PLT 160     [name] 08/13/2018 1:31 PM

## 2018-08-13 NOTE — Plan of Care (Signed)

## 2018-08-13 NOTE — Progress Notes (Signed)
Patient ID: Alex Morrison, male   DOB: 02/18/1970, 47 y.o.   MRN: [id number]   Acute Care Surgery Service Progress Note:    Chief Complaint/Subjective: Pain ok No n/v with ng clamp Has had some addl BMs. walking  Objective: Vital signs in last 24 hours: Temp:  [97.8 F (36.6 C)-99 F (37.2 C)] 98.3 F (36.8 C) (07/29 0804) Pulse Rate:  [78-89] 78 (07/29 0804) Resp:  [12-20] 20 (07/29 0804) BP: (98-139)/(49-76) 118/69 (07/29 0804) SpO2:  [99 %-100 %] 99 % (07/29 0804) Weight:  [85.5 kg-87.7 kg] 87.7 kg (07/29 0500) Last BM Date: 08/10/18  Intake/Output from previous day: 07/28 0701 - 07/29 0700 In: 860 [IV Piggyback:860] Out: 526 [Drains:25] Intake/Output this shift: No intake/output data recorded.  Lungs: cta, nonlabored  Cardiovascular: reg  Abd: soft, min distension, incisions ok. JP serosang; PD cath secure  Extremities: no edema, +SCDs  Neuro: alert, nonfocal  Lab Results: CBC  Recent Labs    08/11/18 0443 08/12/18 0422  WBC 5.9 9.5  HGB 8.8* 8.6*  HCT 26.5* 26.3*  PLT 132* 160   BMET Recent Labs    08/12/18 0422 08/13/18 0334  NA 141 138  K 4.4 4.3  CL 103 99  CO2 20* 24  GLUCOSE 152* 136*  BUN 88* 61*  CREATININE 12.27* 9.24*  CALCIUM 7.5* 7.6*   LFT Hepatic Function Latest Ref Rng & Units 08/13/2018 08/09/2018 08/07/2018  Total Protein 6.5 - 8.1 g/dL - 5.4(L) 5.6(L)  Albumin 3.5 - 5.0 g/dL 2.5(L) 2.9(L) 3.2(L)  AST 15 - 41 U/L - 14(L) 17  ALT 0 - 44 U/L - 16 23  Alk Phosphatase 38 - 126 U/L - 54 66  Total Bilirubin 0.3 - 1.2 mg/dL - 0.7 0.6   PT/INR No results for input(s): LABPROT, INR in the last 72 hours. ABG No results for input(s): PHART, HCO3 in the last 72 hours.  Invalid input(s): PCO2, PO2  Studies/Results:  Anti-infectives: Anti-infectives (From admission, onward)   Start     Dose/Rate Route Frequency Ordered Stop   08/11/18 1156  polymyxin B 500,000 Units, bacitracin 50,000 Units in sodium chloride 0.9 % 500 mL  irrigation  Status:  Discontinued       As needed 08/11/18 1156 08/11/18 1239   08/11/18 0700  ceFAZolin (ANCEF) IVPB 2g/100 mL premix     2 g 200 mL/hr over 30 Minutes Intravenous To ShortStay Surgical 08/10/18 2018 08/11/18 0857   08/11/18 0600  ceFAZolin (ANCEF) powder 2 g  Status:  Discontinued     2 g Other On call to O.R. 08/10/18 2010 08/10/18 2017   08/08/18 1645  ceFAZolin (ANCEF) IVPB 2g/100 mL premix     2 g 200 mL/hr over 30 Minutes Intravenous  Once 08/08/18 1632 08/08/18 1702   08/08/18 1620  ceFAZolin (ANCEF) 2-4 GM/100ML-% IVPB    Note to Pharmacy: Shelton, Whitney   : cabinet override      08/08/18 1620 08/09/18 0429      Medications: Scheduled Meds: . Chlorhexidine Gluconate Cloth  6 each Topical Q0600  . Chlorhexidine Gluconate Cloth  6 each Topical Q0600  . darbepoetin (ARANESP) injection - DIALYSIS  60 mcg Intravenous Q Tue-HD  . insulin aspart  0-15 Units Subcutaneous Q4H  . metoprolol tartrate  2.5 mg Intravenous Q6H  . pantoprazole (PROTONIX) IV  40 mg Intravenous Q24H   Continuous Infusions: . methocarbamol (ROBAXIN) IV 1,000 mg (08/13/18 0533)   PRN Meds:.hydrALAZINE, menthol-cetylpyridinium, morphine injection, ondansetron **OR** ondansetron (ZOFRAN)   IV, phenol  Assessment/Plan: Patient Active Problem List   Diagnosis Date Noted  . ESRD (end stage renal disease) ([place]) 08/08/2018  . HTN (hypertension) 08/08/2018  . SBO (small bowel obstruction) ([place]) 08/08/2018  . Periumbilical hernia 36/64/4034  . Abdominal hernia as complication of peritoneal dialysis 08/08/2018  . Psoriasis 09/29/2013  . Acute gangrenous appendicitis with perforation and peritonitis 09/27/2013  . Type 1 diabetes mellitus ([place]) 09/27/2013  . Acute kidney injury ([place]) 09/27/2013  . DM (diabetes mellitus) type 2, uncontrolled, with ketoacidosis ([place]) 07/17/2012  . DKA (diabetic ketoacidosis) ([place]) 07/16/2012  . Perirectal abscess 07/14/2012   Type 1 DM HTN ESRD on PD -  currently on HD here  SBO secondary to incarcerated incisional hernia S/p Ex Lap, LOA x 30 min, open retrorectus repair of incarcerated incisional hernia with bilateral posterior component separation, insertion Phasix mesh - POD 2 Dc ng Start clears If tolerates clears this am/lunch - will add PO pain med regimen Drain teaching  FEN -dc NG, clears VTE -SCDs, Heparin (Hgb stable)  ID -Ancef periop Foley - Removed POD 1 Follow-up: Dr. Redmond Pulling  Disposition: hopefully will tolerate diet over next 24-36 hrs, anticipate d/c late Thursday at earliest or Friday; will not be able to use PD catheter for 2-3 months assuming no complications from hernia repair.      LOS: 5 days    Leighton Ruff. Redmond Pulling, MD, FACS General, Bariatric, & Minimally Invasive Surgery [phone] [facility]

## 2018-08-13 NOTE — Progress Notes (Signed)
Patient has been accepted at [facility] on a MWF schedule with a seat time of 6:05am. He will need to complete intake paperwork prior to first treatment, which Renal Navigator will discuss with him-timing of completion will depend on discharge and start date in the clinic. Renal Navigator will follow up with Nephrologist/Dr. Royce Macadamia and patient.   [facility] [address], [place], [place]  Alphonzo Cruise, [place] Renal Navigator [phone]

## 2018-08-13 NOTE — Plan of Care (Signed)
  Problem: Education: Goal: Knowledge of General Education information will improve Description: Including pain rating scale, medication(s)/side effects and non-pharmacologic comfort measures 08/13/2018 0038 by Claire Shown, RN Outcome: Progressing 08/13/2018 0037 by Claire Shown, RN Outcome: Progressing   Problem: Health Behavior/Discharge Planning: Goal: Ability to manage health-related needs will improve Outcome: Progressing   Problem: Clinical Measurements: Goal: Will remain free from infection 08/13/2018 0038 by Claire Shown, RN Outcome: Progressing 08/13/2018 0037 by Claire Shown, RN Outcome: Progressing   Problem: Safety: Goal: Ability to remain free from injury will improve 08/13/2018 0038 by Claire Shown, RN Outcome: Progressing 08/13/2018 0037 by Claire Shown, RN Outcome: Progressing   Problem: Skin Integrity: Goal: Risk for impaired skin integrity will decrease 08/13/2018 0038 by Claire Shown, RN Outcome: Progressing 08/13/2018 0037 by Claire Shown, RN Outcome: Progressing

## 2018-08-14 LAB — CBC
HCT: 22.9 % — ABNORMAL LOW (ref 39.0–52.0)
Hemoglobin: 7.7 g/dL — ABNORMAL LOW (ref 13.0–17.0)
MCH: 31.2 pg (ref 26.0–34.0)
MCHC: 33.6 g/dL (ref 30.0–36.0)
MCV: 92.7 fL (ref 80.0–100.0)
Platelets: 156 10*3/uL (ref 150–400)
RBC: 2.47 MIL/uL — ABNORMAL LOW (ref 4.22–5.81)
RDW: 12.4 % (ref 11.5–15.5)
WBC: 7.6 10*3/uL (ref 4.0–10.5)
nRBC: 0 % (ref 0.0–0.2)

## 2018-08-14 LAB — RENAL FUNCTION PANEL
Albumin: 2.3 g/dL — ABNORMAL LOW (ref 3.5–5.0)
Anion gap: 15 (ref 5–15)
BUN: 79 mg/dL — ABNORMAL HIGH (ref 6–20)
CO2: 22 mmol/L (ref 22–32)
Calcium: 7.4 mg/dL — ABNORMAL LOW (ref 8.9–10.3)
Chloride: 94 mmol/L — ABNORMAL LOW (ref 98–111)
Creatinine, Ser: 10.2 mg/dL — ABNORMAL HIGH (ref 0.61–1.24)
GFR calc Af Amer: 6 mL/min — ABNORMAL LOW (ref >60)
GFR calc non Af Amer: 5 mL/min — ABNORMAL LOW (ref >60)
Glucose, Bld: 220 mg/dL — ABNORMAL HIGH (ref 70–99)
Phosphorus: 6.4 mg/dL — ABNORMAL HIGH (ref 2.5–4.6)
Potassium: 3.8 mmol/L (ref 3.5–5.1)
Sodium: 131 mmol/L — ABNORMAL LOW (ref 135–145)

## 2018-08-14 LAB — GLUCOSE, CAPILLARY
Glucose-Capillary: 112 mg/dL — ABNORMAL HIGH (ref 70–99)
Glucose-Capillary: 196 mg/dL — ABNORMAL HIGH (ref 70–99)

## 2018-08-14 MED ORDER — INSULIN GLARGINE 100 UNIT/ML ~~LOC~~ SOLN
5.0000 [IU] | Freq: Every day | SUBCUTANEOUS | Status: DC
  Filled 2018-08-14: qty 0.05

## 2018-08-14 MED ORDER — SODIUM CHLORIDE 0.9% IV SOLUTION: Freq: Once | INTRAVENOUS | Status: DC

## 2018-08-14 MED ORDER — ACETAMINOPHEN 500 MG PO TABS: 1000.0000 mg | ORAL_TABLET | Freq: Three times a day (TID) | ORAL | 0 refills | Status: DC | PRN

## 2018-08-14 MED ORDER — SEVELAMER CARBONATE 800 MG PO TABS: 800.0000 mg | ORAL_TABLET | Freq: Three times a day (TID) | ORAL | Status: DC

## 2018-08-14 NOTE — Progress Notes (Signed)
Pt refused RN to call pt's brother, Merrilee Seashore, to update on pt status and plan of care. Pt reported "he's at work, I'll talk to him later". Will continue to monitor.

## 2018-08-14 NOTE — Progress Notes (Signed)
Renal Navigator notified that patient will be discharged this afternoon. Renal Navigator rescheduled first OP HD appointment tomorrow, 08/15/18 for 12:00pm to provide time for paperwork to be completed beforehand and allow more time for him to get there so soon after discharge (normal time is 6:05am). He needs to arrive at 11:30. Renal Navigator informed patient of this and he was greatly appreciative and stated understanding that this is for tomorrow's appointment only.  Alphonzo Cruise, [place] Renal Navigator [phone]

## 2018-08-14 NOTE — Progress Notes (Addendum)
[facility] Progress Note   Subjective:   Patient seen in room. Just ate some breakfast, says stomach is doing well and denies pain, N/V/D. Had had two HD treatments so far, says they are going well. Denies pain/bleeding of catheter. No dyspnea, SOB, CP, palpitations, edema. Wants to know if he should be getting PD cath flushed.  Objective Vitals:   08/13/18 0804 08/13/18 2011 08/14/18 0500 08/14/18 0739  BP: 118/69 133/82  103/67  Pulse: 78 79  80  Resp: 20 16  16   Temp: 98.3 F (36.8 C) 98.1 F (36.7 C)  98.7 F (37.1 C)  TempSrc: Oral Oral  Oral  SpO2: 99% 100%  100%  Weight:   87.2 kg   Height:       Physical Exam General: Well developed male, alert and in NAD, A&Ox3 Heart: RRR, no murmurs, rubs or gallops Lungs: CTA bilaterally without wheezing, rhonchi or rales Abdomen: Abdominal binder intact, drain in place. Exam deferred Extremities: No LE edema Dialysis Access: RIJ tunneled catheter intact    Additional Objective Labs: Basic Metabolic Panel: Recent Labs  Lab 08/11/18 0443 08/12/18 0422 08/13/18 0334  NA 141 141 138  K 3.6 4.4 4.3  CL 100 103 99  CO2 27 20* 24  GLUCOSE 104* 152* 136*  BUN 69* 88* 61*  CREATININE 11.23* 12.27* 9.24*  CALCIUM 8.0* 7.5* 7.6*  PHOS  --   --  7.2*   Liver Function Tests: Recent Labs  Lab 08/07/18 2336 08/09/18 0348 08/13/18 0334  AST 17 14*  --   ALT 23 16  --   ALKPHOS 66 54  --   BILITOT 0.6 0.7  --   PROT 5.6* 5.4*  --   ALBUMIN 3.2* 2.9* 2.5*   Recent Labs  Lab 08/07/18 2336  LIPASE 27   CBC: Recent Labs  Lab 08/07/18 2336 08/09/18 0348 08/10/18 0456 08/11/18 0443 08/12/18 0422  WBC 9.1 9.5 7.4 5.9 9.5  HGB 9.8* 9.8* 8.9* 8.8* 8.6*  HCT 28.5* 29.3* 27.1* 26.5* 26.3*  MCV 89.6 91.8 93.4 92.3 93.6  PLT 194 165 133* 132* 160   Blood Culture    Component Value Date/Time   SDES URINE, CATHETERIZED 09/28/2013 1034   SPECREQUEST NONE 09/28/2013 1034   CULT NO GROWTH Performed at  [facility] 09/28/2013 1034   REPTSTATUS 09/29/2013 FINAL 09/28/2013 1034    CBG: Recent Labs  Lab 08/13/18 0802 08/13/18 1137 08/13/18 1642 08/13/18 2010 08/14/18 0735  GLUCAP 134* 176* 205* 144* 112*   Medications:  . acetaminophen  1,000 mg Oral Q8H  . Chlorhexidine Gluconate Cloth  6 each Topical Q0600  . Chlorhexidine Gluconate Cloth  6 each Topical Q0600  . darbepoetin (ARANESP) injection - DIALYSIS  60 mcg Intravenous Q Tue-HD  . insulin aspart  0-15 Units Subcutaneous Q4H  . insulin glargine  5 Units Subcutaneous QHS  . methocarbamol  1,000 mg Oral Q8H  . metoprolol tartrate  2.5 mg Intravenous Q6H  . pantoprazole  40 mg Oral QHS    Dialysis Orders: OutptPD: 4 overnight, 2.3 L fill, 1.5 h dwell, no day bag or pause - got venofer 200mg  6/22 and mircera 75 ug 6/22  Home meds: - amlodipine 10 / clonidine 0.1 tid/ irbesartan 300 qd/ metoprolol xl 25 qd - insulin aspart SSI tid 0- 10u - furosemide 80 bid/ sevelamer carb ac tid  Assessment/Plan: 1. Incarcerated umbilical hernia w/ SBO: s/p hernia repair and ex-lap on 7/27.  Will need to hold  PD for now until abdomen is healed from surgery.  Please use alternative to morphine for pain control as patient is ESRD  2. ESRD: usually CCPD, HD now due to #1 above. Has new tunneled catheter. Has had two treatments so far, going well.  Outpatient spot has been set up MWF at [facility] (he is able to start there as early as 7/31).  MWF schedule for HD.  Wants to resume PD at some point, attending checking with surgery to see if ok to flush PD cath weekly.  3. Volume - euvolemic on exam, Will plan UFG 1L with HD tomorrow.  4. HTN - acceptable.  Back on oral beta blocker now.  On multiple anti-hypertensives at home but he has been off of orals here and on IV scheduled meds and PRN.  His anti-hypertensive requirement has been significantly reduced.   5. Anemia ckd - Hb 8.6. no acute indication for PRBC's.    Continue Aranesp 60 mcg weekly.  6. MBC ckd - Corrected calcium ok. hyperphos, still on liquid diet. Resume binders when able.   Anice Paganini, PA-C 08/14/2018, 9:56 AM  [facility] Pager: [phone]

## 2018-08-14 NOTE — Discharge Summary (Signed)
Physician Discharge Summary  Alex Morrison VZC:[id number] DOB: 1970-12-26  PCP: Haywood Pao, MD  Admitted from: Discharged to:  Admit date: 08/07/2018 Discharge date: 08/14/2018  Recommendations for Outpatient Follow-up:  1. Dr. Domenick Gong, PCP in 1 week. 2. Hemodialysis Center: Continue regular hemodialysis appointments on Mondays, Wednesdays and Fridays.  Next appointment on 08/15/2018.  Labs (CBC & renal panel) to be periodically drawn across HD. [facility]: Patient has appropriate follow-up set up as noted below on 8/3, 8/5 and 8/14.  Follow-up Information    [facility] [place] Follow up on 08/20/2018.   Specialty: General Surgery Why: Please arrive at 130pm for your 2pm appointment. Please bring a copy of your photo ID and insurance card to your appointment.  Contact information: [address] [place] [address] [phone]        [name], MD Follow up on 08/29/2018.   Specialty: General Surgery Why: at 845 am. Please arrive 815am for paperwork. Please bring a copy of your photo ID and insurance card to the appointment.   Contact information: [address]  [place] [address] [phone]        [facility], [place]. Go on 08/18/2018.   Specialty: General Surgery Why: at 2pm with a 130pm arrival time for paperwork. This will be a nurse visit to have your drain pulled if the output has been less than < 30cc for 2 days in a row. Please call the office if your drainage output is greater than this.  Contact information: [address] [place] [employer]       Tisovec, Fransico Him, MD. Schedule an appointment as soon as possible for a visit in 1 week(s).   Specialty: Internal Medicine Contact information: [place] [address] [phone]        Hemodialysis Center Follow up on 08/15/2018.   Why: Continue regular hemodialysis  appointments on Mondays, Wednesdays and Fridays.           Home Health: None Equipment/Devices: Patient is being discharged home with abdominal JP drain which will be pulled during outpatient follow-up at [place] office.  Discharge Condition: Improved and stable CODE STATUS: Full. Diet recommendation: Heart healthy & diabetic diet/soft diet.  Discharge Diagnoses:  Principal Problem:   Abdominal hernia as complication of peritoneal dialysis Active Problems:   Type 1 diabetes mellitus (HCC)   ESRD (end stage renal disease) (HCC)   HTN (hypertension)   SBO (small bowel obstruction) (HCC)   Periumbilical hernia   Brief Summary: 48 year old male with PMH of DM 1, HTN, ESRD on peritoneal dialysis was admitted with high-grade SBO due to incarcerated incisional hernia.  General surgery was consulted and he underwent exploratory laparotomy, LOA and repair of incarcerated incisional hernia on 7/27.  Nephrology consulted and hemodialysis was initiated during this hospitalization.  Assessment and plan:   1. SBO secondary to incarcerated incisional hernia: General surgery was consulted.  Patient underwent exploratory laparotomy, LOA and repair of incarcerated hernia on 7/27.  Postop, NG tube was removed and diet was gradually advanced.  Tolerated soft diet.  Having BMs.  No significant pain and has not used much of opioid pain medications over the last couple days.  Dr. Redmond Pulling from [place] communicated with me that patient can be discharged home from their standpoint.  He will be discharging home with the abdominal JP drain with close outpatient follow-up at the surgery clinic for further management  including drain removal. 2. ESRD: Patient was on CCPD prior to admission.  Nephrology was consulted.  Now temporarily (2-3 months) transitioned to HD until abdomen has healed following repair of incarcerated umbilical hernia.  He has new tunneled HD catheter.  I discussed with Dr. Royce Macadamia, Nephrology.  Patient  has been set up for HD on MWF at [facility] and can start as early as 7/31. 3. Essential hypertension: Patient was on multiple antihypertensives prior to admission.  Currently on only low-dose IV metoprolol.  As discussed with nephrology, reasonable to discharge patient on prior home dose of Toprol-XL 25 mg daily.  Discontinued rest of his antihypertensives.  Close outpatient follow-up and adjust medications as needed. 4. Anemia in ESRD: No overt bleeding.  Hemoglobin dropped from 8.6-7.7.  A unit of PRBC was suggested but patient kindly declined.  Continue Aranesp and close CBC monitoring at dialysis center. 5. Secondary hyperparathyroidism: As discussed with nephrology, resumed Renvela at discharge. 6. Type I DM with renal complications: Resumed home dose of Lantus and SSI with home monitoring.    Consultations:  General surgery  Procedures:  As above   Discharge Instructions  Discharge Instructions    Call MD for:  difficulty breathing, headache or visual disturbances   Complete by: As directed    Call MD for:  extreme fatigue   Complete by: As directed    Call MD for:  persistant dizziness or light-headedness   Complete by: As directed    Call MD for:  persistant nausea and vomiting   Complete by: As directed    Call MD for:  redness, tenderness, or signs of infection (pain, swelling, redness, odor or green/yellow discharge around incision site)   Complete by: As directed    Call MD for:  severe uncontrolled pain   Complete by: As directed    Call MD for:  temperature >100.4   Complete by: As directed    Diet - low sodium heart healthy   Complete by: As directed    Diet Carb Modified   Complete by: As directed    Increase activity slowly   Complete by: As directed        Medication List    STOP taking these medications   amLODipine 10 MG tablet Commonly known as: NORVASC   cloNIDine 0.1 MG tablet Commonly known as: CATAPRES   furosemide 80 MG tablet Commonly  known as: LASIX   irbesartan 300 MG tablet Commonly known as: AVAPRO     TAKE these medications   acetaminophen 500 MG tablet Commonly known as: TYLENOL Take 2 tablets (1,000 mg total) by mouth every 8 (eight) hours as needed for mild pain, moderate pain or fever.   Lantus SoloStar 100 UNIT/ML Solostar Pen Generic drug: Insulin Glargine Inject 10 Units into the skin at bedtime.   metoprolol succinate 25 MG 24 hr tablet Commonly known as: TOPROL-XL Take 25 mg by mouth daily.   NovoLOG FlexPen 100 UNIT/ML FlexPen Generic drug: insulin aspart Inject 2-10 Units into the skin 3 (three) times daily. Per sliding scale: 140-199=2 units 200-250=4 units 251-299=6 units 300-349=8 units 350> =10 units   sevelamer carbonate 800 MG tablet Commonly known as: RENVELA Take 2,400 mg by mouth 3 (three) times daily with meals.      No Known Allergies    Procedures/Studies: Ct Abdomen Pelvis Wo Contrast  Result Date: 08/08/2018 CLINICAL DATA:  Nausea and vomiting with abdominal swelling. EXAM: CT ABDOMEN AND PELVIS WITHOUT CONTRAST TECHNIQUE: Multidetector CT imaging of  the abdomen and pelvis was performed following the standard protocol without IV contrast. COMPARISON:  09/27/2013 FINDINGS: Lower chest:  No contributory findings. Hepatobiliary: No focal liver abnormality.No evidence of biliary obstruction or stone. Pancreas: Unremarkable. Spleen: Unremarkable. Adrenals/Urinary Tract: Negative adrenals. No hydronephrosis or stone. Unremarkable bladder. Stomach/Bowel: High-grade small bowel obstruction with dilated fluid-filled loops and mesenteric edema. Bowel loops proximal and distal to the obstructed segments are collapsed and this may reflect a closed loop obstruction. A transition is seen at the level of a large periumbilical hernia which contains distended loops and ascitic fluid. Note that the patient's peritoneal catheter traverses this hernia sac. Bowel sutures in the right upper  quadrant. There is history of perforated appendix surgery. Vascular/Lymphatic: Diffuse arterial calcification. No mass or adenopathy. Reproductive:Negative Other: Small volume ascites/dialysate. Tenckhoff catheter tip is in the anterior left lower quadrant Musculoskeletal: No acute finding IMPRESSION: High-grade small bowel obstruction with a transition point seen at a large periumbilical hernia. The patient's Tenckhoff catheter traverses the hernia sac, which may limit reduction. Proximal small bowel loops are also collapsed, there may be a closed loop obstruction. Electronically Signed   By: Monte Fantasia M.D.   On: 08/08/2018 05:01   Ir Fluoro Guide Cv Line Right  Result Date: 08/08/2018 INDICATION: 48 year old male with a history renal failure EXAM: IMAGE GUIDED PLACEMENT OF TUNNELED HEMODIALYSIS CATHETER MEDICATIONS: 2 G ANCEF; The antibiotic was administered within an appropriate time interval prior to skin puncture. ANESTHESIA/SEDATION: Moderate (conscious) sedation was employed during this procedure. A total of Versed 2.0 mg and Fentanyl 100 mcg was administered intravenously. Moderate Sedation Time: 13 minutes. The patient's level of consciousness and vital signs were monitored continuously by radiology nursing throughout the procedure under my direct supervision. FLUOROSCOPY TIME:  Fluoroscopy Time: 0 minutes 6 seconds (1 mGy). COMPLICATIONS: None PROCEDURE: Informed written consent was obtained from the patient after a discussion of the risks, benefits, and alternatives to treatment. Questions regarding the procedure were encouraged and answered. The right neck and chest were prepped with chlorhexidine in a sterile fashion, and a sterile drape was applied covering the operative field. Maximum barrier sterile technique with sterile gowns and gloves were used for the procedure. A timeout was performed prior to the initiation of the procedure. After creating a small venotomy incision, a micropuncture  kit was utilized to access the right internal jugular vein under direct, real-time ultrasound guidance after the overlying soft tissues were anesthetized with 1% lidocaine with epinephrine. Ultrasound image documentation was performed. The microwire was marked to measure appropriate internal catheter length. External tunneled length was estimated. A total tip to cuff length of 19 cm was selected. Skin and subcutaneous tissues of chest wall below the clavicle were generously infiltrated with 1% lidocaine for local anesthesia. A small stab incision was made with 11 blade scalpel. The selected hemodialysis catheter was tunneled in a retrograde fashion from the anterior chest wall to the venotomy incision. A guidewire was advanced to the level of the IVC and the micropuncture sheath was exchanged for a peel-away sheath. The catheter was then placed through the peel-away sheath with tips ultimately positioned within the superior aspect of the right atrium. Final catheter positioning was confirmed and documented with a spot radiographic image. The catheter aspirates and flushes normally. The catheter was flushed with appropriate volume heparin dwells. The catheter exit site was secured with a 0-Prolene retention suture. The venotomy incision was closed Derma bond and sterile dressing. Dressings were applied at the chest wall. Patient tolerated  the procedure well and remained hemodynamically stable throughout. No complications were encountered and no significant blood loss encountered. IMPRESSION: Status post right IJ tunneled hemodialysis catheter placement. Catheter ready for use. Signed, Dulcy Fanny. Dellia Nims, RPVI [facility] Radiology Electronically Signed   By: Corrie Mckusick D.O.   On: 08/08/2018 17:05   Ir US Guide Vasc Access Right  Result Date: 08/08/2018 INDICATION: 48 year old male with a history renal failure EXAM: IMAGE GUIDED PLACEMENT OF TUNNELED  HEMODIALYSIS CATHETER MEDICATIONS: 2 G ANCEF; The antibiotic was administered within an appropriate time interval prior to skin puncture. ANESTHESIA/SEDATION: Moderate (conscious) sedation was employed during this procedure. A total of Versed 2.0 mg and Fentanyl 100 mcg was administered intravenously. Moderate Sedation Time: 13 minutes. The patient's level of consciousness and vital signs were monitored continuously by radiology nursing throughout the procedure under my direct supervision. FLUOROSCOPY TIME:  Fluoroscopy Time: 0 minutes 6 seconds (1 mGy). COMPLICATIONS: None PROCEDURE: Informed written consent was obtained from the patient after a discussion of the risks, benefits, and alternatives to treatment. Questions regarding the procedure were encouraged and answered. The right neck and chest were prepped with chlorhexidine in a sterile fashion, and a sterile drape was applied covering the operative field. Maximum barrier sterile technique with sterile gowns and gloves were used for the procedure. A timeout was performed prior to the initiation of the procedure. After creating a small venotomy incision, a micropuncture kit was utilized to access the right internal jugular vein under direct, real-time ultrasound guidance after the overlying soft tissues were anesthetized with 1% lidocaine with epinephrine. Ultrasound image documentation was performed. The microwire was marked to measure appropriate internal catheter length. External tunneled length was estimated. A total tip to cuff length of 19 cm was selected. Skin and subcutaneous tissues of chest wall below the clavicle were generously infiltrated with 1% lidocaine for local anesthesia. A small stab incision was made with 11 blade scalpel. The selected hemodialysis catheter was tunneled in a retrograde fashion from the anterior chest wall to the venotomy incision. A guidewire was advanced to the level of the IVC and the micropuncture sheath was exchanged  for a peel-away sheath. The catheter was then placed through the peel-away sheath with tips ultimately positioned within the superior aspect of the right atrium. Final catheter positioning was confirmed and documented with a spot radiographic image. The catheter aspirates and flushes normally. The catheter was flushed with appropriate volume heparin dwells. The catheter exit site was secured with a 0-Prolene retention suture. The venotomy incision was closed Derma bond and sterile dressing. Dressings were applied at the chest wall. Patient tolerated the procedure well and remained hemodynamically stable throughout. No complications were encountered and no significant blood loss encountered. IMPRESSION: Status post right IJ tunneled hemodialysis catheter placement. Catheter ready for use. Signed, Dulcy Fanny. Dellia Nims, RPVI [facility] Radiology Electronically Signed   By: Corrie Mckusick D.O.   On: 08/08/2018 17:05   Dg Abd Portable 1v  Result Date: 08/08/2018 CLINICAL DATA:  Nasogastric tube placement EXAM: PORTABLE ABDOMEN - 1 VIEW COMPARISON:  Abdominal CT earlier today FINDINGS: Nasogastric tube is not seen over the abdomen or lower chest. Known small-bowel obstruction and Tenckhoff catheter. IMPRESSION: No nasogastric tube visualized over the abdomen or lower chest. Electronically Signed   By: Monte Fantasia M.D.   On: 08/08/2018 06:37      Subjective: Patient states that he is doing much better.  He was  on clear liquids this morning and has since advance to soft diet which he has tolerated without abdominal pain, nausea or vomiting.  Having BMs.  No fever or chills.  Eager to go home today.  Denies any other complaints.  Discharge Exam:  Vitals:   08/14/18 0500 08/14/18 0739 08/14/18 1116 08/14/18 1515  BP:  103/67 113/73 115/72  Pulse:  80 79 79  Resp:  '16 16 18  ' Temp:  98.7 F (37.1 C) 98.6 F (37 C) 98.7 F (37.1 C)  TempSrc:  Oral Oral  Oral  SpO2:  100% 100% 100%  Weight: 87.2 kg     Height:        General: Pleasant young male, moderately built and nourished sitting up comfortably in chair this morning. Cardiovascular: S1 & S2 heard, RRR, S1/S2 +. No murmurs, rubs, gallops or clicks. No JVD or pedal edema. Respiratory: Clear to auscultation without wheezing, rhonchi or crackles. No increased work of breathing.  Right IJ TDC catheter site without acute findings. Abdominal: Nondistended, soft.  Midline surgical incision site dressing clean and dry.  Appropriate postop tenderness without peritoneal signs.  JP drain with approximately 45 cc bloodstained fluid.  PD catheter intact without acute findings. CNS: Alert and oriented. No focal deficits. Extremities: no edema, no cyanosis    The results of significant diagnostics from this hospitalization (including imaging, microbiology, ancillary and laboratory) are listed below for reference.     Microbiology: Recent Results (from the past 240 hour(s))  SARS Coronavirus 2 (CEPHEID - Performed in [place] hospital lab), Hosp Order     Status: None   Collection Time: 08/08/18  5:12 AM   Specimen: Nasopharyngeal Swab  Result Value Ref Range Status   SARS Coronavirus 2 NEGATIVE NEGATIVE Final    Comment: (NOTE) If result is NEGATIVE SARS-CoV-2 target nucleic acids are NOT DETECTED. The SARS-CoV-2 RNA is generally detectable in upper and lower  respiratory specimens during the acute phase of infection. The lowest  concentration of SARS-CoV-2 viral copies this assay can detect is 250  copies / mL. A negative result does not preclude SARS-CoV-2 infection  and should not be used as the sole basis for treatment or other  patient management decisions.  A negative result may occur with  improper specimen collection / handling, submission of specimen other  than nasopharyngeal swab, presence of viral mutation(s) within the  areas targeted by this assay, and inadequate number of  viral copies  (<250 copies / mL). A negative result must be combined with clinical  observations, patient history, and epidemiological information. If result is POSITIVE SARS-CoV-2 target nucleic acids are DETECTED. The SARS-CoV-2 RNA is generally detectable in upper and lower  respiratory specimens dur ing the acute phase of infection.  Positive  results are indicative of active infection with SARS-CoV-2.  Clinical  correlation with patient history and other diagnostic information is  necessary to determine patient infection status.  Positive results do  not rule out bacterial infection or co-infection with other viruses. If result is PRESUMPTIVE POSTIVE SARS-CoV-2 nucleic acids MAY BE PRESENT.   A presumptive positive result was obtained on the submitted specimen  and confirmed on repeat testing.  While 2019 novel coronavirus  (SARS-CoV-2) nucleic acids may be present in the submitted sample  additional confirmatory testing may be necessary for epidemiological  and / or clinical management purposes  to differentiate between  SARS-CoV-2 and other Sarbecovirus currently known to infect humans.  If clinically indicated additional testing with an  alternate test  methodology [phone]) is advised. The SARS-CoV-2 RNA is generally  detectable in upper and lower respiratory sp ecimens during the acute  phase of infection. The expected result is Negative. Fact Sheet for Patients:  StrictlyIdeas.no Fact Sheet for Healthcare Providers: BankingDealers.co.za This test is not yet approved or cleared by the [place] FDA and has been authorized for detection and/or diagnosis of SARS-CoV-2 by FDA under an Emergency Use Authorization (EUA).  This EUA will remain in effect (meaning this test can be used) for the duration of the COVID-19 declaration under Section 564(b)(1) of the Act, 21 U.S.C. section 360bbb-3(b)(1), unless the authorization is  terminated or revoked sooner. Performed at [facility], [place] [address], [place], [place] [address]   MRSA PCR Screening     Status: None   Collection Time: 08/10/18  6:31 PM   Specimen: Nasal Mucosa; Nasopharyngeal  Result Value Ref Range Status   MRSA by PCR NEGATIVE NEGATIVE Final    Comment:        The GeneXpert MRSA Assay (FDA approved for NASAL specimens only), is one component of a comprehensive MRSA colonization surveillance program. It is not intended to diagnose MRSA infection nor to guide or monitor treatment for MRSA infections. Performed at [facility], [place] [address], [place], [place] [address]      Labs: CBC: Recent Labs  Lab 08/09/18 [phone] 08/10/18 0456 08/11/18 0443 08/12/18 0422 08/14/18 1119  WBC 9.5 7.4 5.9 9.5 7.6  HGB 9.8* 8.9* 8.8* 8.6* 7.7*  HCT 29.3* 27.1* 26.5* 26.3* 22.9*  MCV 91.8 93.4 92.3 93.6 92.7  PLT 165 133* 132* 160 122   Basic Metabolic Panel: Recent Labs  Lab 08/10/18 0456 08/11/18 0443 08/12/18 0422 08/13/18 0334 08/14/18 1119  NA 138 141 141 138 131*  K 3.7 3.6 4.4 4.3 3.8  CL 98 100 103 99 94*  CO2 28 27 20* 24 22  GLUCOSE 142* 104* 152* 136* 220*  BUN 50* 69* 88* 61* 79*  CREATININE 9.63* 11.23* 12.27* 9.24* 10.20*  CALCIUM 8.1* 8.0* 7.5* 7.6* 7.4*  PHOS  --   --   --  7.2* 6.4*   Liver Function Tests: Recent Labs  Lab 08/07/18 2336 08/09/18 0348 08/13/18 0334 08/14/18 1119  AST 17 14*  --   --   ALT 23 16  --   --   ALKPHOS 66 54  --   --   BILITOT 0.6 0.7  --   --   PROT 5.6* 5.4*  --   --   ALBUMIN 3.2* 2.9* 2.5* 2.3*   CBG: Recent Labs  Lab 08/13/18 1137 08/13/18 1642 08/13/18 2010 08/14/18 0735 08/14/18 1114  GLUCAP 176* 205* 144* 112* 196*   Urinalysis    Component Value Date/Time   COLORURINE STRAW (A) 08/07/2018 2328   APPEARANCEUR CLEAR 08/07/2018 2328   LABSPEC 1.010 08/07/2018 2328   PHURINE 6.0 08/07/2018 2328   GLUCOSEU >=500 (A) 08/07/2018 2328   HGBUR SMALL (A)  08/07/2018 2328   BILIRUBINUR NEGATIVE 08/07/2018 2328   KETONESUR NEGATIVE 08/07/2018 2328   PROTEINUR 100 (A) 08/07/2018 2328   UROBILINOGEN 0.2 09/28/2013 1034   NITRITE NEGATIVE 08/07/2018 2328   LEUKOCYTESUR NEGATIVE 08/07/2018 2328      Time coordinating discharge: 35 minutes  SIGNED:  Vernell Leep, MD, FACP, [facility]. [facility]  To contact the attending provider between 7A-7P or the covering provider during after hours 7P-7A, please log into the web site [email or web address] and access  using universal [place] password for that web site. If you do not have the password, please call the hospital operator.

## 2018-08-14 NOTE — Progress Notes (Signed)
Nephrology Quick Note:  Dr. Royce Macadamia recommended 1 unit PRBC with dialysis tomorrow due to hemoglobin of 7.7. However, after discussing this with the patient, he decided not to proceed with the transfusion. Order discontinued. Will continue ESA and follow hemoglobin.   Anice Paganini, PA-C 08/14/2018, 1:55 PM  [facility] Pager: [phone]

## 2018-08-14 NOTE — Discharge Instructions (Signed)
[facility]      [facility] °[phone] ° °OPEN ABDOMINAL SURGERY: POST OP INSTRUCTIONS ° °Always review your discharge instruction sheet given to you by the facility where your surgery was performed. ° °IF YOU HAVE DISABILITY OR FAMILY LEAVE FORMS, YOU MUST BRING THEM TO THE OFFICE FOR PROCESSING.  PLEASE DO NOT GIVE THEM TO YOUR DOCTOR. ° °1. A prescription for pain medication may be given to you upon discharge.  Take your pain medication as prescribed, if needed.  If narcotic pain medicine is not needed, then you may take acetaminophen (Tylenol) or ibuprofen (Advil) as needed. °2. Take your usually prescribed medications unless otherwise directed. °3. If you need a refill on your pain medication, please contact your pharmacy. They will contact our office to request authorization.  Prescriptions will not be filled after 5pm or on week-ends. °4. You should follow a light diet the first few days after arrival home, such as soup and crackers, pudding, etc.unless your doctor has advised otherwise. A high-fiber, low fat diet can be resumed as tolerated.   Be sure to include lots of fluids daily. Most patients will experience some swelling and bruising on the chest and neck area.  Ice packs will help.  Swelling and bruising can take several days to resolve °5. Most patients will experience some swelling and bruising in the area of the incision. Ice pack will help. Swelling and bruising can take several days to resolve..  °6. It is common to experience some constipation if taking pain medication after surgery.  Increasing fluid intake and taking a stool softener will usually help or prevent this problem from occurring.  A mild laxative (Milk of Magnesia or Miralax) should be taken according to package directions if there are no bowel movements after 48 hours. °7.  You may have steri-strips (small skin tapes) in place directly over the incision.  These strips should be left on the skin for 7-10 days.  If your  surgeon used skin glue on the incision, you may shower in 24 hours.  The glue will flake off over the next 2-3 weeks.  Any sutures or staples will be removed at the office during your follow-up visit. You may find that a light gauze bandage over your incision may keep your staples from being rubbed or pulled. You may shower and replace the bandage daily. °8. ACTIVITIES:  You may resume regular (light) daily activities beginning the next day--such as daily self-care, walking, climbing stairs--gradually increasing activities as tolerated.  You may have sexual intercourse when it is comfortable.  Refrain from any heavy lifting or straining until approved by your doctor. °a. You may drive when you no longer are taking prescription pain medication, you can comfortably wear a seatbelt, and you can safely maneuver your car and apply brakes °b. Return to Work: ___________________________________ °9. You should see your doctor in the office for a follow-up appointment approximately two weeks after your surgery.  Make sure that you call for this appointment within a day or two after you arrive home to insure a convenient appointment time. °OTHER INSTRUCTIONS:  °_____________________________________________________________ °_____________________________________________________________ ° °WHEN TO CALL YOUR DOCTOR: °1. Fever over 101.0 °2. Inability to urinate °3. Nausea and/or vomiting °4. Extreme swelling or bruising °5. Continued bleeding from incision. °6. Increased pain, redness, or drainage from the incision. °7. Difficulty swallowing or breathing °8. Muscle cramping or spasms. °9. Numbness or tingling in hands or feet or around lips. ° °The clinic staff is available to   answer your questions during regular business hours.  Please dont hesitate to call and ask to speak to one of the nurses if you have concerns.  For further questions, please visit [email or web address]       JP Drain [employer] this  sheet to all of your post-operative appointments while you have your drains.  Please measure your drains by CC's or ML's.  Make sure you drain and measure your JP Drains 3 times per day.  At the end of each day, add up totals for the left side and add up totals for the right side.    ( 9 am )     ( 3 pm )        ( 9 pm )                Date L  R  L  R  L  R  Total L/R                                                                                                                                                                                        Surgical Drain Home Care Surgical drains are used to remove extra fluid that normally builds up in a surgical wound after surgery. A surgical drain helps to heal a surgical wound. Different kinds of surgical drains include:  Active drains. These drains use suction to pull drainage away from the surgical wound. Drainage flows through a tube to a container outside of the body. With these drains, you need to keep the bulb or the drainage container flat (compressed) at all times, except while you empty it. Flattening the bulb or container creates suction.  Passive drains. These drains allow fluid to drain naturally, by gravity. Drainage flows through a tube to a bandage (dressing) or a container outside of the body. Passive drains do not need to be emptied. A drain is placed during surgery. Right after surgery, drainage is usually bright red and a little thicker than water. The drainage may gradually turn yellow or pink and become thinner. It is likely that your health care provider will remove the drain when the drainage stops or when the amount decreases to 1-2 Tbsp (15-30 mL) during a 24-hour period. Supplies needed:  Tape.  Germ-free cleaning solution (sterile saline).  Cotton swabs.  Split gauze drain sponge: 4 x 4 inches (10 x 10 cm).  Gauze square: 4 x 4 inches (10 x 10 cm). How to care for your surgical drain Care for your  drain as told by your health care provider. This is important to help prevent infection. If your drain is  placed at your back, or any other hard-to-reach area, ask another person to assist you in performing the following tasks: General care  Keep the skin around the drain dry and covered with a dressing at all times.  Check your drain area every day for signs of infection. Check for: ? Redness, swelling, or pain. ? Pus or a bad smell. ? Cloudy drainage. ? Tenderness or pressure at the drain exit site. Changing the dressing Follow instructions from your health care provider about how to change your dressing. Change your dressing at least once a day. Change it more often if needed to keep the dressing dry. Make sure you: 1. Gather your supplies. 2. Wash your hands with soap and water before you change your dressing. If soap and water are not available, use hand sanitizer. 3. Remove the old dressing. Avoid using scissors to do that. 4. Wash your hands with soap and water again after removing the old dressing. 5. Use sterile saline to clean your skin around the drain. You may need to use a cotton swab to clean the skin. 6. Place the tube through the slit in a drain sponge. Place the drain sponge so that it covers your wound. 7. Place the gauze square or another drain sponge on top of the drain sponge that is on the wound. Make sure the tube is between those layers. 8. Tape the dressing to your skin. 9. Tape the drainage tube to your skin 1-2 inches (2.5-5 cm) below the place where the tube enters your body. Taping keeps the tube from pulling on any stitches (sutures) that you have. 10. Wash your hands with soap and water. 11. Write down the color of your drainage and how often you change your dressing. How to empty your active drain  1. Make sure that you have a measuring cup that you can empty your drainage into. 2. Wash your hands with soap and water. If soap and water are not available, use  hand sanitizer. 3. Loosen any pins or clips that hold the tube in place. 4. If your health care provider tells you to strip the tube to prevent clots and tube blockages: ? Hold the tube at the skin with one hand. Use your other hand to pinch the tubing with your thumb and first finger. ? Gently move your fingers down the tube while squeezing very lightly. This clears any drainage, clots, or tissue from the tube. ? You may need to do this several times each day to keep the tube clear. Do not pull on the tube. 5. Open the bulb cap or the drain plug. Do not touch the inside of the cap or the bottom of the plug. 6. Turn the device upside down and gently squeeze. 7. Empty all of the drainage into the measuring cup. 8. Compress the bulb or the container and replace the cap or the plug. To compress the bulb or the container, squeeze it firmly in the middle while you close the cap or plug the container. 9. Write down the amount of drainage that you have in each 24-hour period. If you have less than 2 Tbsp (30 mL) of drainage during 24 hours, contact your health care provider. 10. Flush the drainage down the toilet. 11. Wash your hands with soap and water. Contact a health care provider if:  You have redness, swelling, or pain around your drain area.  You have pus or a bad smell coming from your drain area.  You have a  fever or chills.  The skin around your drain is warm to the touch.  The amount of drainage that you have is increasing instead of decreasing.  You have drainage that is cloudy.  There is a sudden stop or a sudden decrease in the amount of drainage that you have.  Your drain tube falls out.  Your active drain does not stay compressed after you empty it. Summary  Surgical drains are used to remove extra fluid that normally builds up in a surgical wound after surgery.  Different kinds of surgical drains include active drains and passive drains. Active drains use suction to pull  drainage away from the surgical wound, and passive drains allow fluid to drain naturally.  It is important to care for your drain to prevent infection. If your drain is placed at your back, or any other hard-to-reach area, ask another person to assist you.  Contact your health care provider if you have redness, swelling, or pain around your drain area. This information is not intended to replace advice given to you by your health care provider. Make sure you discuss any questions you have with your health care provider. Document Released: 12/30/1999 Document Revised: 02/05/2018 Document Reviewed: 02/05/2018 Elsevier Patient Education  2020 [employer].   Additional discharge instructions  Please get your medications reviewed and adjusted by your Primary MD.  Please request your Primary MD to go over all Hospital Tests and Procedure/Radiological results at the follow up, please get all Hospital records sent to your Prim MD by signing hospital release before you go home.  If you had Pneumonia of Lung problems at the Hospital: Please get a 2 view Chest X ray done in 6-8 weeks after hospital discharge or sooner if instructed by your Primary MD.  If you have Congestive Heart Failure: Please call your Cardiologist or Primary MD anytime you have any of the following symptoms:  1) 3 pound weight gain in 24 hours or 5 pounds in 1 week  2) shortness of breath, with or without a dry hacking cough  3) swelling in the hands, feet or stomach  4) if you have to sleep on extra pillows at night in order to breathe  Follow cardiac low salt diet and 1.5 lit/day fluid restriction.  If you have diabetes Accuchecks 4 times/day, Once in AM empty stomach and then before each meal. Log in all results and show them to your primary doctor at your next visit. If any glucose reading is under 80 or above 300 call your primary MD immediately.  If you have Seizure/Convulsions/Epilepsy: Please do not drive,  operate heavy machinery, participate in activities at heights or participate in high speed sports until you have seen by Primary MD or a Neurologist and advised to do so again.  If you had Gastrointestinal Bleeding: Please ask your Primary MD to check a complete blood count within one week of discharge or at your next visit. Your endoscopic/colonoscopic biopsies that are pending at the time of discharge, will also need to followed by your Primary MD.  Get Medicines reviewed and adjusted. Please take all your medications with you for your next visit with your Primary MD  Please request your Primary MD to go over all hospital tests and procedure/radiological results at the follow up, please ask your Primary MD to get all Hospital records sent to his/her office.  If you experience worsening of your admission symptoms, develop shortness of breath, life threatening emergency, suicidal or homicidal thoughts you must  seek medical attention immediately by calling 911 or calling your MD immediately  if symptoms less severe.  You must read complete instructions/literature along with all the possible adverse reactions/side effects for all the Medicines you take and that have been prescribed to you. Take any new Medicines after you have completely understood and accpet all the possible adverse reactions/side effects.   Do not drive or operate heavy machinery when taking Pain medications.   Do not take more than prescribed Pain, Sleep and Anxiety Medications  Special Instructions: If you have smoked or chewed Tobacco  in the last 2 yrs please stop smoking, stop any regular Alcohol  and or any Recreational drug use.  Wear Seat belts while driving.  Please note You were cared for by a hospitalist during your hospital stay. If you have any questions about your discharge medications or the care you received while you were in the hospital after you are discharged, you can call the unit and asked to speak with  the hospitalist on call if the hospitalist that took care of you is not available. Once you are discharged, your primary care physician will handle any further medical issues. Please note that NO REFILLS for any discharge medications will be authorized once you are discharged, as it is imperative that you return to your primary care physician (or establish a relationship with a primary care physician if you do not have one) for your aftercare needs so that they can reassess your need for medications and monitor your lab values.  You can reach the hospitalist office at phone [phone] or fax [phone]   If you do not have a primary care physician, you can call [phone] for a physician referral.

## 2018-08-14 NOTE — Progress Notes (Signed)
3 Days Post-Op  Subjective: CC:  Denies abdominal pain, N/V/D. Passing flatus and had a soft BM again yesterday. Tolerating CLD. Mobilizing in halls. He has been set up for HD as outpatient.   Objective: Vital signs in last 24 hours: Temp:  [98.1 F (36.7 C)-98.7 F (37.1 C)] 98.7 F (37.1 C) (07/30 0739) Pulse Rate:  [79-80] 80 (07/30 0739) Resp:  [16] 16 (07/30 0739) BP: (103-133)/(67-82) 103/67 (07/30 0739) SpO2:  [100 %] 100 % (07/30 0739) Weight:  [87.2 kg] 87.2 kg (07/30 0500) Last BM Date: 08/13/18  Intake/Output from previous day: 07/29 0701 - 07/30 0700 In: 600 [P.O.:600] Out: 65 [Drains:45] Intake/Output this shift: Total I/O In: 240 [P.O.:240] Out: -   PE: Gen: Awake and alert, NAD Lungs: Normal rate and effort Abd: Soft, ND, NT. Midline honeycomb dressing in place. No saturation. Staples below in place and wound appears c/d/i. JP drain stripped. Bloody SS fluid in bulb. 45cc/24 hours. Peritoneal catheter in right mid abdomen without signs of surrounding infection. +BS Msk: No edema   Lab Results:  Recent Labs    08/12/18 0422  WBC 9.5  HGB 8.6*  HCT 26.3*  PLT 160   BMET Recent Labs    08/12/18 0422 08/13/18 0334  NA 141 138  K 4.4 4.3  CL 103 99  CO2 20* 24  GLUCOSE 152* 136*  BUN 88* 61*  CREATININE 12.27* 9.24*  CALCIUM 7.5* 7.6*   PT/INR No results for input(s): LABPROT, INR in the last 72 hours. CMP     Component Value Date/Time   NA 138 08/13/2018 0334   K 4.3 08/13/2018 0334   CL 99 08/13/2018 0334   CO2 24 08/13/2018 0334   GLUCOSE 136 (H) 08/13/2018 0334   BUN 61 (H) 08/13/2018 0334   CREATININE 9.24 (H) 08/13/2018 0334   CALCIUM 7.6 (L) 08/13/2018 0334   PROT 5.4 (L) 08/09/2018 0348   ALBUMIN 2.5 (L) 08/13/2018 0334   AST 14 (L) 08/09/2018 0348   ALT 16 08/09/2018 0348   ALKPHOS 54 08/09/2018 0348   BILITOT 0.7 08/09/2018 0348   GFRNONAA 6 (L) 08/13/2018 0334   GFRAA 7 (L) 08/13/2018 0334   Lipase      Component Value Date/Time   LIPASE 27 08/07/2018 2336       Studies/Results: No results found.  Anti-infectives: Anti-infectives (From admission, onward)   Start     Dose/Rate Route Frequency Ordered Stop   08/11/18 1156  polymyxin B 500,000 Units, bacitracin 50,000 Units in sodium chloride 0.9 % 500 mL irrigation  Status:  Discontinued       As needed 08/11/18 1156 08/11/18 1239   08/11/18 0700  ceFAZolin (ANCEF) IVPB 2g/100 mL premix     2 g 200 mL/hr over 30 Minutes Intravenous To [facility] 08/10/18 2018 08/11/18 0857   08/11/18 0600  ceFAZolin (ANCEF) powder 2 g  Status:  Discontinued     2 g Other On call to O.R. 08/10/18 2010 08/10/18 2017   08/08/18 1645  ceFAZolin (ANCEF) IVPB 2g/100 mL premix     2 g 200 mL/hr over 30 Minutes Intravenous  Once 08/08/18 1632 08/08/18 1702   08/08/18 1620  ceFAZolin (ANCEF) 2-4 GM/100ML-% IVPB    Note to Pharmacy: Desiree Hane   : cabinet override      08/08/18 1620 08/09/18 0429       Assessment/Plan HTN - per primary ESRD - Per Renal note - Outpatient  has been set up  MWF at [facility] is able to start there as early as 7/31).MWF schedule for HD.Wants to resume PD at some point. Will check with Dr. Redmond Pulling about PD cath flushing. His note from yesterday states patient will not be able to use PD catheter for 2-3 months assuming no complications from hernia repair.   SBO secondary to incarcerated incisional hernia S/p Ex Lap, LOA x 30 min, open retrorectus repair of incarcerated incisional hernia with bilateral posterior component separation, insertion Phasix mesh - POD 3 Advance to FLD. AAT to soft  Drain teaching Anticipate d/c late Thursday at earliest or Friday. Will recheck this afternoon  FEN -FLD VTE -SCDs,Heparin (Hgb stable) ID -Ancef periop Foley - Removed POD 1 Follow-up: Dr. Redmond Pulling    LOS: 6 days    Jillyn Ledger , [facility] 08/14/2018, 11:13 AM Pager:  [phone]

## 2018-08-14 NOTE — Progress Notes (Signed)
Patient has refused morning cbg check 400 am and vital signs Neta Mends RN 5:35 AM 08-14-2018

## 2018-08-14 NOTE — Progress Notes (Signed)
Provided discharge education/instructions, all questions and concerns addressed.

## 2018-08-14 NOTE — Plan of Care (Signed)

## 2018-08-15 DIAGNOSIS — N186 End stage renal disease: Secondary | ICD-10-CM | POA: Diagnosis not present

## 2018-08-15 DIAGNOSIS — N2581 Secondary hyperparathyroidism of renal origin: Secondary | ICD-10-CM | POA: Diagnosis not present

## 2018-08-15 DIAGNOSIS — E1122 Type 2 diabetes mellitus with diabetic chronic kidney disease: Secondary | ICD-10-CM | POA: Diagnosis not present

## 2018-08-15 DIAGNOSIS — Z992 Dependence on renal dialysis: Secondary | ICD-10-CM | POA: Diagnosis not present

## 2018-08-15 LAB — GLUCOSE, CAPILLARY: Glucose-Capillary: 134 mg/dL — ABNORMAL HIGH (ref 70–99)

## 2018-08-18 DIAGNOSIS — N2581 Secondary hyperparathyroidism of renal origin: Secondary | ICD-10-CM | POA: Diagnosis not present

## 2018-08-18 DIAGNOSIS — N186 End stage renal disease: Secondary | ICD-10-CM | POA: Diagnosis not present

## 2018-08-18 DIAGNOSIS — Z992 Dependence on renal dialysis: Secondary | ICD-10-CM | POA: Diagnosis not present

## 2018-08-20 DIAGNOSIS — N186 End stage renal disease: Secondary | ICD-10-CM | POA: Diagnosis not present

## 2018-08-20 DIAGNOSIS — N2581 Secondary hyperparathyroidism of renal origin: Secondary | ICD-10-CM | POA: Diagnosis not present

## 2018-08-20 DIAGNOSIS — Z992 Dependence on renal dialysis: Secondary | ICD-10-CM | POA: Diagnosis not present

## 2018-08-22 DIAGNOSIS — N2581 Secondary hyperparathyroidism of renal origin: Secondary | ICD-10-CM | POA: Diagnosis not present

## 2018-08-22 DIAGNOSIS — N186 End stage renal disease: Secondary | ICD-10-CM | POA: Diagnosis not present

## 2018-08-22 DIAGNOSIS — Z992 Dependence on renal dialysis: Secondary | ICD-10-CM | POA: Diagnosis not present

## 2018-08-25 DIAGNOSIS — N186 End stage renal disease: Secondary | ICD-10-CM | POA: Diagnosis not present

## 2018-08-25 DIAGNOSIS — N2581 Secondary hyperparathyroidism of renal origin: Secondary | ICD-10-CM | POA: Diagnosis not present

## 2018-08-25 DIAGNOSIS — Z992 Dependence on renal dialysis: Secondary | ICD-10-CM | POA: Diagnosis not present

## 2018-08-27 DIAGNOSIS — N2581 Secondary hyperparathyroidism of renal origin: Secondary | ICD-10-CM | POA: Diagnosis not present

## 2018-08-27 DIAGNOSIS — N186 End stage renal disease: Secondary | ICD-10-CM | POA: Diagnosis not present

## 2018-08-27 DIAGNOSIS — Z992 Dependence on renal dialysis: Secondary | ICD-10-CM | POA: Diagnosis not present

## 2018-08-29 DIAGNOSIS — N186 End stage renal disease: Secondary | ICD-10-CM | POA: Diagnosis not present

## 2018-08-29 DIAGNOSIS — Z992 Dependence on renal dialysis: Secondary | ICD-10-CM | POA: Diagnosis not present

## 2018-08-29 DIAGNOSIS — N2581 Secondary hyperparathyroidism of renal origin: Secondary | ICD-10-CM | POA: Diagnosis not present

## 2018-09-01 DIAGNOSIS — N186 End stage renal disease: Secondary | ICD-10-CM | POA: Diagnosis not present

## 2018-09-01 DIAGNOSIS — N2581 Secondary hyperparathyroidism of renal origin: Secondary | ICD-10-CM | POA: Diagnosis not present

## 2018-09-01 DIAGNOSIS — Z992 Dependence on renal dialysis: Secondary | ICD-10-CM | POA: Diagnosis not present

## 2018-09-01 DIAGNOSIS — Z23 Encounter for immunization: Secondary | ICD-10-CM | POA: Diagnosis not present

## 2018-09-03 DIAGNOSIS — Z992 Dependence on renal dialysis: Secondary | ICD-10-CM | POA: Diagnosis not present

## 2018-09-03 DIAGNOSIS — Z23 Encounter for immunization: Secondary | ICD-10-CM | POA: Diagnosis not present

## 2018-09-03 DIAGNOSIS — N2581 Secondary hyperparathyroidism of renal origin: Secondary | ICD-10-CM | POA: Diagnosis not present

## 2018-09-03 DIAGNOSIS — N186 End stage renal disease: Secondary | ICD-10-CM | POA: Diagnosis not present

## 2018-09-05 DIAGNOSIS — Z992 Dependence on renal dialysis: Secondary | ICD-10-CM | POA: Diagnosis not present

## 2018-09-05 DIAGNOSIS — N2581 Secondary hyperparathyroidism of renal origin: Secondary | ICD-10-CM | POA: Diagnosis not present

## 2018-09-05 DIAGNOSIS — Z23 Encounter for immunization: Secondary | ICD-10-CM | POA: Diagnosis not present

## 2018-09-05 DIAGNOSIS — N186 End stage renal disease: Secondary | ICD-10-CM | POA: Diagnosis not present

## 2018-09-08 DIAGNOSIS — N2581 Secondary hyperparathyroidism of renal origin: Secondary | ICD-10-CM | POA: Diagnosis not present

## 2018-09-08 DIAGNOSIS — Z992 Dependence on renal dialysis: Secondary | ICD-10-CM | POA: Diagnosis not present

## 2018-09-08 DIAGNOSIS — N186 End stage renal disease: Secondary | ICD-10-CM | POA: Diagnosis not present

## 2018-09-10 DIAGNOSIS — N186 End stage renal disease: Secondary | ICD-10-CM | POA: Diagnosis not present

## 2018-09-10 DIAGNOSIS — Z992 Dependence on renal dialysis: Secondary | ICD-10-CM | POA: Diagnosis not present

## 2018-09-10 DIAGNOSIS — N2581 Secondary hyperparathyroidism of renal origin: Secondary | ICD-10-CM | POA: Diagnosis not present

## 2018-09-12 DIAGNOSIS — N186 End stage renal disease: Secondary | ICD-10-CM | POA: Diagnosis not present

## 2018-09-12 DIAGNOSIS — N2581 Secondary hyperparathyroidism of renal origin: Secondary | ICD-10-CM | POA: Diagnosis not present

## 2018-09-12 DIAGNOSIS — Z992 Dependence on renal dialysis: Secondary | ICD-10-CM | POA: Diagnosis not present

## 2018-09-15 DIAGNOSIS — N186 End stage renal disease: Secondary | ICD-10-CM | POA: Diagnosis not present

## 2018-09-15 DIAGNOSIS — N2581 Secondary hyperparathyroidism of renal origin: Secondary | ICD-10-CM | POA: Diagnosis not present

## 2018-09-15 DIAGNOSIS — Z992 Dependence on renal dialysis: Secondary | ICD-10-CM | POA: Diagnosis not present

## 2018-09-15 DIAGNOSIS — E1122 Type 2 diabetes mellitus with diabetic chronic kidney disease: Secondary | ICD-10-CM | POA: Diagnosis not present

## 2018-09-17 DIAGNOSIS — N2581 Secondary hyperparathyroidism of renal origin: Secondary | ICD-10-CM | POA: Diagnosis not present

## 2018-09-17 DIAGNOSIS — N186 End stage renal disease: Secondary | ICD-10-CM | POA: Diagnosis not present

## 2018-09-17 DIAGNOSIS — Z992 Dependence on renal dialysis: Secondary | ICD-10-CM | POA: Diagnosis not present

## 2018-09-19 DIAGNOSIS — Z992 Dependence on renal dialysis: Secondary | ICD-10-CM | POA: Diagnosis not present

## 2018-09-19 DIAGNOSIS — N186 End stage renal disease: Secondary | ICD-10-CM | POA: Diagnosis not present

## 2018-09-19 DIAGNOSIS — N2581 Secondary hyperparathyroidism of renal origin: Secondary | ICD-10-CM | POA: Diagnosis not present

## 2018-09-22 DIAGNOSIS — Z992 Dependence on renal dialysis: Secondary | ICD-10-CM | POA: Diagnosis not present

## 2018-09-22 DIAGNOSIS — N186 End stage renal disease: Secondary | ICD-10-CM | POA: Diagnosis not present

## 2018-09-22 DIAGNOSIS — N2581 Secondary hyperparathyroidism of renal origin: Secondary | ICD-10-CM | POA: Diagnosis not present

## 2018-09-24 DIAGNOSIS — N2581 Secondary hyperparathyroidism of renal origin: Secondary | ICD-10-CM | POA: Diagnosis not present

## 2018-09-24 DIAGNOSIS — N186 End stage renal disease: Secondary | ICD-10-CM | POA: Diagnosis not present

## 2018-09-24 DIAGNOSIS — Z992 Dependence on renal dialysis: Secondary | ICD-10-CM | POA: Diagnosis not present

## 2018-09-26 DIAGNOSIS — N2581 Secondary hyperparathyroidism of renal origin: Secondary | ICD-10-CM | POA: Diagnosis not present

## 2018-09-26 DIAGNOSIS — Z4902 Encounter for fitting and adjustment of peritoneal dialysis catheter: Secondary | ICD-10-CM | POA: Diagnosis not present

## 2018-09-26 DIAGNOSIS — N185 Chronic kidney disease, stage 5: Secondary | ICD-10-CM | POA: Diagnosis not present

## 2018-09-26 DIAGNOSIS — K432 Incisional hernia without obstruction or gangrene: Secondary | ICD-10-CM | POA: Diagnosis not present

## 2018-09-26 DIAGNOSIS — Z992 Dependence on renal dialysis: Secondary | ICD-10-CM | POA: Diagnosis not present

## 2018-09-26 DIAGNOSIS — N186 End stage renal disease: Secondary | ICD-10-CM | POA: Diagnosis not present

## 2018-09-29 DIAGNOSIS — N186 End stage renal disease: Secondary | ICD-10-CM | POA: Diagnosis not present

## 2018-09-29 DIAGNOSIS — Z992 Dependence on renal dialysis: Secondary | ICD-10-CM | POA: Diagnosis not present

## 2018-09-29 DIAGNOSIS — N2581 Secondary hyperparathyroidism of renal origin: Secondary | ICD-10-CM | POA: Diagnosis not present

## 2018-09-29 DIAGNOSIS — Z23 Encounter for immunization: Secondary | ICD-10-CM | POA: Diagnosis not present

## 2018-10-01 DIAGNOSIS — Z992 Dependence on renal dialysis: Secondary | ICD-10-CM | POA: Diagnosis not present

## 2018-10-01 DIAGNOSIS — N2581 Secondary hyperparathyroidism of renal origin: Secondary | ICD-10-CM | POA: Diagnosis not present

## 2018-10-01 DIAGNOSIS — N186 End stage renal disease: Secondary | ICD-10-CM | POA: Diagnosis not present

## 2018-10-01 DIAGNOSIS — Z23 Encounter for immunization: Secondary | ICD-10-CM | POA: Diagnosis not present

## 2018-10-03 DIAGNOSIS — Z992 Dependence on renal dialysis: Secondary | ICD-10-CM | POA: Diagnosis not present

## 2018-10-03 DIAGNOSIS — N2581 Secondary hyperparathyroidism of renal origin: Secondary | ICD-10-CM | POA: Diagnosis not present

## 2018-10-03 DIAGNOSIS — Z23 Encounter for immunization: Secondary | ICD-10-CM | POA: Diagnosis not present

## 2018-10-03 DIAGNOSIS — N186 End stage renal disease: Secondary | ICD-10-CM | POA: Diagnosis not present

## 2018-10-06 DIAGNOSIS — N186 End stage renal disease: Secondary | ICD-10-CM | POA: Diagnosis not present

## 2018-10-06 DIAGNOSIS — N2581 Secondary hyperparathyroidism of renal origin: Secondary | ICD-10-CM | POA: Diagnosis not present

## 2018-10-06 DIAGNOSIS — Z992 Dependence on renal dialysis: Secondary | ICD-10-CM | POA: Diagnosis not present

## 2018-10-08 DIAGNOSIS — N186 End stage renal disease: Secondary | ICD-10-CM | POA: Diagnosis not present

## 2018-10-08 DIAGNOSIS — Z992 Dependence on renal dialysis: Secondary | ICD-10-CM | POA: Diagnosis not present

## 2018-10-08 DIAGNOSIS — N2581 Secondary hyperparathyroidism of renal origin: Secondary | ICD-10-CM | POA: Diagnosis not present

## 2018-10-10 DIAGNOSIS — N2581 Secondary hyperparathyroidism of renal origin: Secondary | ICD-10-CM | POA: Diagnosis not present

## 2018-10-10 DIAGNOSIS — N186 End stage renal disease: Secondary | ICD-10-CM | POA: Diagnosis not present

## 2018-10-10 DIAGNOSIS — Z992 Dependence on renal dialysis: Secondary | ICD-10-CM | POA: Diagnosis not present

## 2018-10-13 DIAGNOSIS — Z23 Encounter for immunization: Secondary | ICD-10-CM | POA: Diagnosis not present

## 2018-10-13 DIAGNOSIS — Z992 Dependence on renal dialysis: Secondary | ICD-10-CM | POA: Diagnosis not present

## 2018-10-13 DIAGNOSIS — N186 End stage renal disease: Secondary | ICD-10-CM | POA: Diagnosis not present

## 2018-10-13 DIAGNOSIS — N2581 Secondary hyperparathyroidism of renal origin: Secondary | ICD-10-CM | POA: Diagnosis not present

## 2018-10-15 DIAGNOSIS — E1122 Type 2 diabetes mellitus with diabetic chronic kidney disease: Secondary | ICD-10-CM | POA: Diagnosis not present

## 2018-10-15 DIAGNOSIS — N2581 Secondary hyperparathyroidism of renal origin: Secondary | ICD-10-CM | POA: Diagnosis not present

## 2018-10-15 DIAGNOSIS — N186 End stage renal disease: Secondary | ICD-10-CM | POA: Diagnosis not present

## 2018-10-15 DIAGNOSIS — Z992 Dependence on renal dialysis: Secondary | ICD-10-CM | POA: Diagnosis not present

## 2018-10-15 DIAGNOSIS — Z23 Encounter for immunization: Secondary | ICD-10-CM | POA: Diagnosis not present

## 2018-10-17 DIAGNOSIS — Z992 Dependence on renal dialysis: Secondary | ICD-10-CM | POA: Diagnosis not present

## 2018-10-17 DIAGNOSIS — N2581 Secondary hyperparathyroidism of renal origin: Secondary | ICD-10-CM | POA: Diagnosis not present

## 2018-10-17 DIAGNOSIS — N186 End stage renal disease: Secondary | ICD-10-CM | POA: Diagnosis not present

## 2018-10-20 DIAGNOSIS — Z992 Dependence on renal dialysis: Secondary | ICD-10-CM | POA: Diagnosis not present

## 2018-10-20 DIAGNOSIS — N2581 Secondary hyperparathyroidism of renal origin: Secondary | ICD-10-CM | POA: Diagnosis not present

## 2018-10-20 DIAGNOSIS — N186 End stage renal disease: Secondary | ICD-10-CM | POA: Diagnosis not present

## 2018-10-22 DIAGNOSIS — N2581 Secondary hyperparathyroidism of renal origin: Secondary | ICD-10-CM | POA: Diagnosis not present

## 2018-10-22 DIAGNOSIS — N186 End stage renal disease: Secondary | ICD-10-CM | POA: Diagnosis not present

## 2018-10-22 DIAGNOSIS — Z992 Dependence on renal dialysis: Secondary | ICD-10-CM | POA: Diagnosis not present

## 2018-10-24 DIAGNOSIS — Z992 Dependence on renal dialysis: Secondary | ICD-10-CM | POA: Diagnosis not present

## 2018-10-24 DIAGNOSIS — N186 End stage renal disease: Secondary | ICD-10-CM | POA: Diagnosis not present

## 2018-10-24 DIAGNOSIS — N2581 Secondary hyperparathyroidism of renal origin: Secondary | ICD-10-CM | POA: Diagnosis not present

## 2018-10-27 DIAGNOSIS — Z992 Dependence on renal dialysis: Secondary | ICD-10-CM | POA: Diagnosis not present

## 2018-10-27 DIAGNOSIS — N186 End stage renal disease: Secondary | ICD-10-CM | POA: Diagnosis not present

## 2018-10-27 DIAGNOSIS — N2581 Secondary hyperparathyroidism of renal origin: Secondary | ICD-10-CM | POA: Diagnosis not present

## 2018-10-29 DIAGNOSIS — Z992 Dependence on renal dialysis: Secondary | ICD-10-CM | POA: Diagnosis not present

## 2018-10-29 DIAGNOSIS — N186 End stage renal disease: Secondary | ICD-10-CM | POA: Diagnosis not present

## 2018-10-29 DIAGNOSIS — N2581 Secondary hyperparathyroidism of renal origin: Secondary | ICD-10-CM | POA: Diagnosis not present

## 2018-10-31 DIAGNOSIS — N2581 Secondary hyperparathyroidism of renal origin: Secondary | ICD-10-CM | POA: Diagnosis not present

## 2018-10-31 DIAGNOSIS — N186 End stage renal disease: Secondary | ICD-10-CM | POA: Diagnosis not present

## 2018-10-31 DIAGNOSIS — Z992 Dependence on renal dialysis: Secondary | ICD-10-CM | POA: Diagnosis not present

## 2018-11-03 DIAGNOSIS — N186 End stage renal disease: Secondary | ICD-10-CM | POA: Diagnosis not present

## 2018-11-03 DIAGNOSIS — N2581 Secondary hyperparathyroidism of renal origin: Secondary | ICD-10-CM | POA: Diagnosis not present

## 2018-11-03 DIAGNOSIS — Z23 Encounter for immunization: Secondary | ICD-10-CM | POA: Diagnosis not present

## 2018-11-03 DIAGNOSIS — Z992 Dependence on renal dialysis: Secondary | ICD-10-CM | POA: Diagnosis not present

## 2018-11-04 DIAGNOSIS — Z992 Dependence on renal dialysis: Secondary | ICD-10-CM | POA: Diagnosis not present

## 2018-11-04 DIAGNOSIS — N2581 Secondary hyperparathyroidism of renal origin: Secondary | ICD-10-CM | POA: Diagnosis not present

## 2018-11-04 DIAGNOSIS — D631 Anemia in chronic kidney disease: Secondary | ICD-10-CM | POA: Diagnosis not present

## 2018-11-04 DIAGNOSIS — E46 Unspecified protein-calorie malnutrition: Secondary | ICD-10-CM | POA: Diagnosis not present

## 2018-11-05 DIAGNOSIS — N2581 Secondary hyperparathyroidism of renal origin: Secondary | ICD-10-CM | POA: Diagnosis not present

## 2018-11-05 DIAGNOSIS — Z992 Dependence on renal dialysis: Secondary | ICD-10-CM | POA: Diagnosis not present

## 2018-11-05 DIAGNOSIS — N186 End stage renal disease: Secondary | ICD-10-CM | POA: Diagnosis not present

## 2018-11-05 DIAGNOSIS — Z23 Encounter for immunization: Secondary | ICD-10-CM | POA: Diagnosis not present

## 2018-11-07 DIAGNOSIS — Z992 Dependence on renal dialysis: Secondary | ICD-10-CM | POA: Diagnosis not present

## 2018-11-07 DIAGNOSIS — N186 End stage renal disease: Secondary | ICD-10-CM | POA: Diagnosis not present

## 2018-11-07 DIAGNOSIS — Z23 Encounter for immunization: Secondary | ICD-10-CM | POA: Diagnosis not present

## 2018-11-07 DIAGNOSIS — N2581 Secondary hyperparathyroidism of renal origin: Secondary | ICD-10-CM | POA: Diagnosis not present

## 2018-11-10 DIAGNOSIS — N2581 Secondary hyperparathyroidism of renal origin: Secondary | ICD-10-CM | POA: Diagnosis not present

## 2018-11-10 DIAGNOSIS — Z992 Dependence on renal dialysis: Secondary | ICD-10-CM | POA: Diagnosis not present

## 2018-11-10 DIAGNOSIS — N186 End stage renal disease: Secondary | ICD-10-CM | POA: Diagnosis not present

## 2018-11-12 DIAGNOSIS — Z992 Dependence on renal dialysis: Secondary | ICD-10-CM | POA: Diagnosis not present

## 2018-11-12 DIAGNOSIS — N186 End stage renal disease: Secondary | ICD-10-CM | POA: Diagnosis not present

## 2018-11-12 DIAGNOSIS — N2581 Secondary hyperparathyroidism of renal origin: Secondary | ICD-10-CM | POA: Diagnosis not present

## 2018-11-14 DIAGNOSIS — N186 End stage renal disease: Secondary | ICD-10-CM | POA: Diagnosis not present

## 2018-11-14 DIAGNOSIS — N2581 Secondary hyperparathyroidism of renal origin: Secondary | ICD-10-CM | POA: Diagnosis not present

## 2018-11-14 DIAGNOSIS — Z992 Dependence on renal dialysis: Secondary | ICD-10-CM | POA: Diagnosis not present

## 2018-11-15 DIAGNOSIS — N186 End stage renal disease: Secondary | ICD-10-CM | POA: Diagnosis not present

## 2018-11-15 DIAGNOSIS — E1122 Type 2 diabetes mellitus with diabetic chronic kidney disease: Secondary | ICD-10-CM | POA: Diagnosis not present

## 2018-11-15 DIAGNOSIS — Z992 Dependence on renal dialysis: Secondary | ICD-10-CM | POA: Diagnosis not present

## 2018-11-17 DIAGNOSIS — N2581 Secondary hyperparathyroidism of renal origin: Secondary | ICD-10-CM | POA: Diagnosis not present

## 2018-11-17 DIAGNOSIS — N186 End stage renal disease: Secondary | ICD-10-CM | POA: Diagnosis not present

## 2018-11-17 DIAGNOSIS — Z992 Dependence on renal dialysis: Secondary | ICD-10-CM | POA: Diagnosis not present

## 2018-11-19 DIAGNOSIS — Z992 Dependence on renal dialysis: Secondary | ICD-10-CM | POA: Diagnosis not present

## 2018-11-19 DIAGNOSIS — N186 End stage renal disease: Secondary | ICD-10-CM | POA: Diagnosis not present

## 2018-11-19 DIAGNOSIS — N2581 Secondary hyperparathyroidism of renal origin: Secondary | ICD-10-CM | POA: Diagnosis not present

## 2018-11-21 DIAGNOSIS — N186 End stage renal disease: Secondary | ICD-10-CM | POA: Diagnosis not present

## 2018-11-21 DIAGNOSIS — Z992 Dependence on renal dialysis: Secondary | ICD-10-CM | POA: Diagnosis not present

## 2018-11-21 DIAGNOSIS — N2581 Secondary hyperparathyroidism of renal origin: Secondary | ICD-10-CM | POA: Diagnosis not present

## 2018-11-24 DIAGNOSIS — Z992 Dependence on renal dialysis: Secondary | ICD-10-CM | POA: Diagnosis not present

## 2018-11-24 DIAGNOSIS — N186 End stage renal disease: Secondary | ICD-10-CM | POA: Diagnosis not present

## 2018-11-24 DIAGNOSIS — N2581 Secondary hyperparathyroidism of renal origin: Secondary | ICD-10-CM | POA: Diagnosis not present

## 2018-11-26 DIAGNOSIS — N2581 Secondary hyperparathyroidism of renal origin: Secondary | ICD-10-CM | POA: Diagnosis not present

## 2018-11-26 DIAGNOSIS — Z992 Dependence on renal dialysis: Secondary | ICD-10-CM | POA: Diagnosis not present

## 2018-11-26 DIAGNOSIS — N186 End stage renal disease: Secondary | ICD-10-CM | POA: Diagnosis not present

## 2018-11-28 DIAGNOSIS — Z992 Dependence on renal dialysis: Secondary | ICD-10-CM | POA: Diagnosis not present

## 2018-11-28 DIAGNOSIS — N2581 Secondary hyperparathyroidism of renal origin: Secondary | ICD-10-CM | POA: Diagnosis not present

## 2018-11-28 DIAGNOSIS — N186 End stage renal disease: Secondary | ICD-10-CM | POA: Diagnosis not present

## 2018-12-01 DIAGNOSIS — Z992 Dependence on renal dialysis: Secondary | ICD-10-CM | POA: Diagnosis not present

## 2018-12-01 DIAGNOSIS — N2581 Secondary hyperparathyroidism of renal origin: Secondary | ICD-10-CM | POA: Diagnosis not present

## 2018-12-01 DIAGNOSIS — N186 End stage renal disease: Secondary | ICD-10-CM | POA: Diagnosis not present

## 2018-12-03 DIAGNOSIS — Z992 Dependence on renal dialysis: Secondary | ICD-10-CM | POA: Diagnosis not present

## 2018-12-03 DIAGNOSIS — N2581 Secondary hyperparathyroidism of renal origin: Secondary | ICD-10-CM | POA: Diagnosis not present

## 2018-12-03 DIAGNOSIS — N186 End stage renal disease: Secondary | ICD-10-CM | POA: Diagnosis not present

## 2018-12-05 DIAGNOSIS — N186 End stage renal disease: Secondary | ICD-10-CM | POA: Diagnosis not present

## 2018-12-05 DIAGNOSIS — Z992 Dependence on renal dialysis: Secondary | ICD-10-CM | POA: Diagnosis not present

## 2018-12-05 DIAGNOSIS — N2581 Secondary hyperparathyroidism of renal origin: Secondary | ICD-10-CM | POA: Diagnosis not present

## 2018-12-07 DIAGNOSIS — N186 End stage renal disease: Secondary | ICD-10-CM | POA: Diagnosis not present

## 2018-12-07 DIAGNOSIS — N2581 Secondary hyperparathyroidism of renal origin: Secondary | ICD-10-CM | POA: Diagnosis not present

## 2018-12-07 DIAGNOSIS — Z23 Encounter for immunization: Secondary | ICD-10-CM | POA: Diagnosis not present

## 2018-12-07 DIAGNOSIS — Z992 Dependence on renal dialysis: Secondary | ICD-10-CM | POA: Diagnosis not present

## 2018-12-09 DIAGNOSIS — Z992 Dependence on renal dialysis: Secondary | ICD-10-CM | POA: Diagnosis not present

## 2018-12-09 DIAGNOSIS — Z23 Encounter for immunization: Secondary | ICD-10-CM | POA: Diagnosis not present

## 2018-12-09 DIAGNOSIS — N2581 Secondary hyperparathyroidism of renal origin: Secondary | ICD-10-CM | POA: Diagnosis not present

## 2018-12-09 DIAGNOSIS — N186 End stage renal disease: Secondary | ICD-10-CM | POA: Diagnosis not present

## 2018-12-12 DIAGNOSIS — N186 End stage renal disease: Secondary | ICD-10-CM | POA: Diagnosis not present

## 2018-12-12 DIAGNOSIS — Z23 Encounter for immunization: Secondary | ICD-10-CM | POA: Diagnosis not present

## 2018-12-12 DIAGNOSIS — N2581 Secondary hyperparathyroidism of renal origin: Secondary | ICD-10-CM | POA: Diagnosis not present

## 2018-12-12 DIAGNOSIS — Z992 Dependence on renal dialysis: Secondary | ICD-10-CM | POA: Diagnosis not present

## 2018-12-15 DIAGNOSIS — Z992 Dependence on renal dialysis: Secondary | ICD-10-CM | POA: Diagnosis not present

## 2018-12-15 DIAGNOSIS — N186 End stage renal disease: Secondary | ICD-10-CM | POA: Diagnosis not present

## 2018-12-15 DIAGNOSIS — E1122 Type 2 diabetes mellitus with diabetic chronic kidney disease: Secondary | ICD-10-CM | POA: Diagnosis not present

## 2018-12-15 DIAGNOSIS — N2581 Secondary hyperparathyroidism of renal origin: Secondary | ICD-10-CM | POA: Diagnosis not present

## 2018-12-17 DIAGNOSIS — Z992 Dependence on renal dialysis: Secondary | ICD-10-CM | POA: Diagnosis not present

## 2018-12-17 DIAGNOSIS — N186 End stage renal disease: Secondary | ICD-10-CM | POA: Diagnosis not present

## 2018-12-17 DIAGNOSIS — N2581 Secondary hyperparathyroidism of renal origin: Secondary | ICD-10-CM | POA: Diagnosis not present

## 2018-12-19 DIAGNOSIS — Z992 Dependence on renal dialysis: Secondary | ICD-10-CM | POA: Diagnosis not present

## 2018-12-19 DIAGNOSIS — N2581 Secondary hyperparathyroidism of renal origin: Secondary | ICD-10-CM | POA: Diagnosis not present

## 2018-12-19 DIAGNOSIS — N186 End stage renal disease: Secondary | ICD-10-CM | POA: Diagnosis not present

## 2018-12-22 DIAGNOSIS — Z992 Dependence on renal dialysis: Secondary | ICD-10-CM | POA: Diagnosis not present

## 2018-12-22 DIAGNOSIS — N186 End stage renal disease: Secondary | ICD-10-CM | POA: Diagnosis not present

## 2018-12-22 DIAGNOSIS — N2581 Secondary hyperparathyroidism of renal origin: Secondary | ICD-10-CM | POA: Diagnosis not present

## 2018-12-24 DIAGNOSIS — N2581 Secondary hyperparathyroidism of renal origin: Secondary | ICD-10-CM | POA: Diagnosis not present

## 2018-12-24 DIAGNOSIS — N186 End stage renal disease: Secondary | ICD-10-CM | POA: Diagnosis not present

## 2018-12-24 DIAGNOSIS — Z992 Dependence on renal dialysis: Secondary | ICD-10-CM | POA: Diagnosis not present

## 2018-12-25 DIAGNOSIS — N186 End stage renal disease: Secondary | ICD-10-CM | POA: Diagnosis not present

## 2018-12-25 DIAGNOSIS — Z992 Dependence on renal dialysis: Secondary | ICD-10-CM | POA: Diagnosis not present

## 2018-12-26 DIAGNOSIS — Z992 Dependence on renal dialysis: Secondary | ICD-10-CM | POA: Diagnosis not present

## 2018-12-26 DIAGNOSIS — N186 End stage renal disease: Secondary | ICD-10-CM | POA: Diagnosis not present

## 2018-12-26 DIAGNOSIS — N2581 Secondary hyperparathyroidism of renal origin: Secondary | ICD-10-CM | POA: Diagnosis not present

## 2018-12-29 ENCOUNTER — Other Ambulatory Visit: Payer: Self-pay

## 2018-12-29 DIAGNOSIS — Z992 Dependence on renal dialysis: Secondary | ICD-10-CM | POA: Diagnosis not present

## 2018-12-29 DIAGNOSIS — N186 End stage renal disease: Secondary | ICD-10-CM

## 2018-12-29 DIAGNOSIS — N2581 Secondary hyperparathyroidism of renal origin: Secondary | ICD-10-CM | POA: Diagnosis not present

## 2018-12-30 ENCOUNTER — Ambulatory Visit (INDEPENDENT_AMBULATORY_CARE_PROVIDER_SITE_OTHER): Payer: BC Managed Care – PPO | Admitting: Physician Assistant

## 2018-12-30 ENCOUNTER — Other Ambulatory Visit: Payer: Self-pay

## 2018-12-30 ENCOUNTER — Ambulatory Visit: Payer: Self-pay | Admitting: General Surgery

## 2018-12-30 ENCOUNTER — Ambulatory Visit (HOSPITAL_COMMUNITY)
Admission: RE | Admit: 2018-12-30 | Discharge: 2018-12-30 | Disposition: A | Discharge status: Home / Self Care | Payer: BC Managed Care – PPO | Source: Ambulatory Visit | Attending: Family | Admitting: Family

## 2018-12-30 ENCOUNTER — Ambulatory Visit (INDEPENDENT_AMBULATORY_CARE_PROVIDER_SITE_OTHER)
Admission: RE | Admit: 2018-12-30 | Discharge: 2018-12-30 | Disposition: A | Discharge status: Home / Self Care | Payer: BC Managed Care – PPO | Source: Ambulatory Visit | Attending: Family | Admitting: Family

## 2018-12-30 VITALS — BP 161/93 | HR 84 | Temp 97.7°F | Resp 16 | Ht 71.0 in | Wt 208.0 lb

## 2018-12-30 DIAGNOSIS — N186 End stage renal disease: Secondary | ICD-10-CM

## 2018-12-30 NOTE — Progress Notes (Signed)
PCP - Domenick Gong MD Cardiologist - none  Chest x-ray - none EKG - will do on pre op visit Stress Test - none ECHO - none Cardiac Cath - none  Sleep Study - none CPAP -   Fasting Blood Sugar - 160 Checks Blood Sugar __2-3___ times a day  Blood Thinner Instructions: none Aspirin Instructions: none Last Dose:  Anesthesia review:   Patient denies shortness of breath, fever, cough and chest pain at PAT appointment   Patient verbalized understanding of instructions that were given to them at the PAT appointment. Patient was also instructed that they will need to review over the PAT instructions again at home before surgery.

## 2018-12-30 NOTE — H&P (View-Only) (Signed)
Requested by:  Haywood Pao, MD [address] [place],  [place] [address]  Reason for consultation: New access   History of Present Illness   Alex Morrison is a 48 y.o. (1970/12/10) male who presents for evaluation for permanent access.  The patient is right hand dominant.  The patient has not had previous access procedures.  He is currently dialyzing via right IJ tunneled dialysis catheter on a Monday Wednesday Friday schedule.  He does not take any blood thinners.  He had been on peritoneal dialysis however was converted to hemodialysis.  He is scheduled for surgery tomorrow at [facility] long with general surgery for removal of PD dialysis catheter.  Patient has had Covid 19 virus however has been asymptomatic without fever or cough for the past 2 weeks.  Past Medical History:  Diagnosis Date  . Abscess of buttock   . Acute kidney injury ([place]) 09/27/2013  . Diabetes mellitus without complication ([place])   . ESRD (end stage renal disease) ([place])   . HTN (hypertension) 08/08/2018  . Perforated appendix    Peritonitis    Past Surgical History:  Procedure Laterality Date  . Ex lap with ileocecectomy  09/27/13   Dr. Brantley Stage  . INCISION AND DRAINAGE    . INCISIONAL HERNIA REPAIR N/A 08/11/2018   Procedure: OPEN RETRORECTUS REPAIR OF INCARCERATED INCISIONAL HERNIA WITH BILATERAL POSTERIOR COMPONENT SEPARATION;  Surgeon: [name], MD;  Location: [place];  Service: General;  Laterality: N/A;  . INSERTION OF MESH  08/11/2018   Procedure: INSERTION OF PHASIX MESH;  Surgeon: [name], MD;  Location: [place];  Service: General;;  . insertion of PD catheter  12/2017  . IR FLUORO GUIDE CV LINE RIGHT  08/08/2018  . IR US GUIDE VASC ACCESS RIGHT  08/08/2018  . LAPAROSCOPIC ABDOMINAL EXPLORATION N/A 09/27/2013  . LAPAROSCOPIC ASSISTED VENTRAL HERNIA REPAIR  2019  . LAPAROSCOPY N/A 09/27/2013   Procedure: LAPAROSCOPY DIAGNOSTIC;  Surgeon: Erroll Luna, MD;  Location: [place];  Service: General;   Laterality: N/A;  . LAPAROTOMY  08/11/2018   Procedure: EXPLORATORY LAPAROTOMY;  Surgeon: [name], MD;  Location: [place];  Service: General;;  . LYSIS OF ADHESION  08/11/2018   Procedure: LYSIS OF ADHESIONS X 30 MINUTES;  Surgeon: [name], MD;  Location: [place];  Service: General;;  . SHOULDER SURGERY  1989    Social History   Socioeconomic History  . Marital status: Single    Spouse name: Not on file  . Number of children: Not on file  . Years of education: Not on file  . Highest education level: Not on file  Occupational History  . Not on file  Tobacco Use  . Smoking status: Never Smoker  . Smokeless tobacco: Never Used  Substance and Sexual Activity  . Alcohol use: No  . Drug use: No  . Sexual activity: Not on file  Other Topics Concern  . Not on file  Social History Narrative  . Not on file   Social Determinants of Health   Financial Resource Strain:   . Difficulty of Paying Living Expenses: Not on file  Food Insecurity:   . Worried About Charity fundraiser in the Last Year: Not on file  . Ran Out of Food in the Last Year: Not on file  Transportation Needs:   . Lack of Transportation (Medical): Not on file  . Lack of Transportation (Non-Medical): Not on file  Physical Activity:   . Days of Exercise  per Week: Not on file  . Minutes of Exercise per Session: Not on file  Stress:   . Feeling of Stress : Not on file  Social Connections:   . Frequency of Communication with Friends and Family: Not on file  . Frequency of Social Gatherings with Friends and Family: Not on file  . Attends Religious Services: Not on file  . Active Member of Clubs or Organizations: Not on file  . Attends Archivist Meetings: Not on file  . Marital Status: Not on file  Intimate Partner Violence:   . Fear of Current or Ex-Partner: Not on file  . Emotionally Abused: Not on file  . Physically Abused: Not on file  . Sexually Abused: Not on file    Family History    Problem Relation Age of Onset  . Diabetes Mellitus II Mother   . Diabetes Mother   . Heart disease Mother   . Diabetes Mellitus I Father   . Diabetes Father   . Diabetes Mellitus II Brother   . Diabetes Brother     Current Outpatient Medications  Medication Sig Dispense Refill  . acetaminophen (TYLENOL) 500 MG tablet Take 2 tablets (1,000 mg total) by mouth every 8 (eight) hours as needed for mild pain, moderate pain or fever. 30 tablet 0  . insulin aspart (NOVOLOG FLEXPEN) 100 UNIT/ML FlexPen Inject 3-7 Units into the skin 3 (three) times daily with meals.     . Insulin Glargine (LANTUS SOLOSTAR) 100 UNIT/ML Solostar Pen Inject 10 Units into the skin at bedtime.      No current facility-administered medications for this visit.    No Known Allergies  REVIEW OF SYSTEMS (negative unless checked):   Cardiac:  []  Chest pain or chest pressure? []  Shortness of breath upon activity? []  Shortness of breath when lying flat? []  Irregular heart rhythm?  Vascular:  []  Pain in calf, thigh, or hip brought on by walking? []  Pain in feet at night that wakes you up from your sleep? []  Blood clot in your veins? []  Leg swelling?  Pulmonary:  []  Oxygen at home? []  Productive cough? []  Wheezing?  Neurologic:  []  Sudden weakness in arms or legs? []  Sudden numbness in arms or legs? []  Sudden onset of difficult speaking or slurred speech? []  Temporary loss of vision in one eye? []  Problems with dizziness?  Gastrointestinal:  []  Blood in stool? []  Vomited blood?  Genitourinary:  []  Burning when urinating? []  Blood in urine?  Psychiatric:  []  Major depression  Hematologic:  []  Bleeding problems? []  Problems with blood clotting?  Dermatologic:  []  Rashes or ulcers?  Constitutional:  []  Fever or chills?  Ear/Nose/Throat:  []  Change in hearing? []  Nose bleeds? []  Sore throat?  Musculoskeletal:  []  Back pain? []  Joint pain? []  Muscle pain?   Physical Examination      Vitals:   12/30/18 1532  BP: (!) 161/93  Pulse: 84  Resp: 16  Temp: 97.7 F (36.5 C)  TempSrc: Oral  SpO2: 98%  Weight: 208 lb (94.3 kg)  Height: 5\' 11"  (1.803 m)   Body mass index is 29.01 kg/m.  General Alert, O x 3, WD, NAD  Head [place]/AT,    Neck Supple, mid-line trachea,    Pulmonary Sym exp, good B air movt, CTA B  Cardiac RRR, Nl S1, S2  Vascular Vessel Right Left  Radial Palpable Palpable    Gastro- intestinal soft, non-distended, non-tender to palpation, PD catheter unremarkable  Musculo- skeletal M/S  5/5 throughout  , Extremities without ischemic changes  , No edema present, No visible varicosities ,   Neurologic Cranial nerves 2-12 intact , Pain and light touch intact in extremities , Motor exam as listed above  Psychiatric Judgement intact, Mood & affect appropriate for pt's clinical situation  Dermatologic See M/S exam for extremity exam, No rashes otherwise noted  Lymphatic  Palpable lymph nodes: None     Non-invasive Vascular Imaging   BUE Vein Mapping  (Date: 12/30/2018):   R arm: acceptable vein conduits include basilic and cephalic  L arm: acceptable vein conduits include basilic and cephalic  BUE Doppler (Date: 12/30/2018):   R arm:   Brachial: tri,  Radial: tri,   Ulnar: tri,   L arm:   Brachial: tri,   Radial: tri,  Ulnar: tri,    Medical Decision Making   Alex Morrison is a 48 y.o. male who presents with end stage renal disease   Patient is dialyzing via right IJ TDC without complication and is in need of a permanent dialysis access  Patient is right arm dominant and would prefer access placement in left arm  Acceptable conduits in left arm include cephalic and basilic veins  Plan will be for left arm arteriovenous fistula creation on a next available nondialysis day  I educated patient to avoid venous puncture in left arm to preserve fistula conduits Risk, benefits, and alternatives to access surgery were discussed.    The patient is aware the risks include but are not limited to: bleeding, infection, steal syndrome, nerve damage, thrombosis, failure to mature, and need for additional procedures.   The patient agrees to proceed with the procedure.   Dagoberto Ligas PA-C [facility] Office: [phone]  12/30/2018, 3:54 PM  Clinic MD: Carlis Abbott

## 2018-12-30 NOTE — Patient Instructions (Addendum)
DUE TO COVID-19 ONLY ONE VISITOR IS ALLOWED TO COME WITH YOU AND STAY IN THE WAITING ROOM ONLY DURING PRE OP AND PROCEDURE DAY OF SURGERY. THE 1 VISITOR MAY VISIT WITH YOU AFTER SURGERY IN YOUR PRIVATE ROOM DURING VISITING HOURS ONLY!  YOU NEED TO HAVE A COVID 19 TEST ON_12/18/2020______ @_2 :10 pm______, THIS TEST MUST BE DONE BEFORE SURGERY, COME  [address], [place] , [address].  ([place]) ONCE YOUR COVID TEST IS COMPLETED, PLEASE BEGIN THE QUARANTINE INSTRUCTIONS AS OUTLINED IN YOUR HANDOUT.                Alex Morrison    Your procedure is scheduled on: 01/06/2019   Report to [facility] Main  Entrance    Report to admitting at 12:15 PM     Call this number if you have problems the morning of surgery [phone]    Remember: Do not eat food or drink liquids after Midnight.    BRUSH YOUR TEETH MORNING OF SURGERY AND RINSE YOUR MOUTH OUT, NO CHEWING GUM CANDY OR MINTS.     Take these medicines the morning of surgery with A SIP OF WATER: None   DO NOT TAKE ANY DIABETIC MEDICATIONS DAY OF YOUR SURGERY                               You may not have any metal on your body including hair pins and              piercings  Do not wear jewelry, make-up, lotions, powders or perfumes, deodorant             Do not wear nail polish on your fingernails.  Do not shave  48 hours prior to surgery.              Men may shave face and neck.   Do not bring valuables to the hospital. [place].  Contacts, dentures or bridgework may not be worn into surgery.  Leave suitcase in the car. After surgery it may be brought to your room.     Patients discharged the day of surgery will not be allowed to drive home. IF YOU ARE HAVING SURGERY AND GOING HOME THE SAME DAY, YOU MUST HAVE AN ADULT TO DRIVE YOU HOME AND  BE WITH YOU FOR 24 HOURS. YOU MAY GO HOME BY TAXI OR UBER OR ORTHERWISE, BUT AN ADULT MUST ACCOMPANY YOU  HOME AND STAY WITH YOU FOR 24 HOURS.  Name and phone number of your driver: Father [phone] [facility]   Special Instructions: N/A              Please read over the following fact sheets you were given: _____________________________________________________________________             [facility] - Preparing for Surgery Before surgery, you can play an important role.  Because skin is not sterile, your skin needs to be as free of germs as possible.  You can reduce the number of germs on your skin by washing with CHG (chlorahexidine gluconate) soap before surgery.  CHG is an antiseptic cleaner which kills germs and bonds with the skin to continue killing germs even after washing. Please DO NOT use if you have an allergy to CHG or antibacterial soaps.  If your skin becomes reddened/irritated stop using the CHG and inform your nurse when you arrive at Short Stay. Do not shave (including legs and underarms) for at least 48 hours prior to the first CHG shower.  You may shave your face/neck. Please follow these instructions carefully:  1.  Shower with CHG Soap the night before surgery and the  morning of Surgery.  2.  If you choose to wash your hair, wash your hair first as usual with your  normal  shampoo.  3.  After you shampoo, rinse your hair and body thoroughly to remove the  shampoo.                            4.  Use CHG as you would any other liquid soap.  You can apply chg directly  to the skin and wash                       Gently with a scrungie or clean washcloth.  5.  Apply the CHG Soap to your body ONLY FROM THE NECK DOWN.   Do not use on face/ open                           Wound or open sores. Avoid contact with eyes, ears mouth and genitals (private parts).                       Wash face,  Genitals (private parts) with your normal soap.             6.  Wash thoroughly, paying special attention to the area where your surgery  will be performed.  7.  Thoroughly rinse your body  with warm water from the neck down.  8.  DO NOT shower/wash with your normal soap after using and rinsing off  the CHG Soap.                9.  Pat yourself dry with a clean towel.            10.  Wear clean pajamas.            11.  Place clean sheets on your bed the night of your first shower and do not  sleep with pets. Day of Surgery : Do not apply any lotions/deodorants the morning of surgery.  Please wear clean clothes to the hospital/surgery center.  FAILURE TO FOLLOW THESE INSTRUCTIONS MAY RESULT IN THE CANCELLATION OF YOUR SURGERY PATIENT SIGNATURE_________________________________  NURSE SIGNATURE__________________________________  ________________________________________________________________________ How to Manage Your Diabetes Before and After Surgery  Why is it important to control my blood sugar before and after surgery? . Improving blood sugar levels before and after surgery helps healing and can limit problems. . A way of improving blood sugar control is eating a healthy diet by: o  Eating less sugar and carbohydrates o  Increasing activity/exercise o  Talking with your doctor about reaching your blood sugar goals . High blood sugars (greater than 180 mg/dL) can raise your risk of infections and slow your recovery, so you will need to focus on controlling your diabetes during the weeks before surgery. . Make sure that the doctor who takes care of your diabetes knows about your planned surgery including the date and location.  How do I manage my blood sugar before surgery? . Check your blood  sugar at least 4 times a day, starting 2 days before surgery, to make sure that the level is not too high or low. o Check your blood sugar the morning of your surgery when you wake up and every 2 hours until you get to the Short Stay unit. . If your blood sugar is less than 70 mg/dL, you will need to treat for low blood sugar: o Do not take insulin. o Treat a low blood sugar (less  than 70 mg/dL) with  cup of clear juice (cranberry or apple), 4 glucose tablets, OR glucose gel. o Recheck blood sugar in 15 minutes after treatment (to make sure it is greater than 70 mg/dL). If your blood sugar is not greater than 70 mg/dL on recheck, call [phone] for further instructions. . Report your blood sugar to the short stay nurse when you get to Short Stay.  . If you are admitted to the hospital after surgery: o Your blood sugar will be checked by the staff and you will probably be given insulin after surgery (instead of oral diabetes medicines) to make sure you have good blood sugar levels. o The goal for blood sugar control after surgery is 80-180 mg/dL.   WHAT DO I DO ABOUT MY DIABETES MEDICATION?  Marland Kitchen Do not take oral diabetes medicines (pills) the morning of surgery.  . THE NIGHT BEFORE SURGERY, take 8 units of  Insulin Glargine (Lantus Solostar)       You may take your Novolog aspart (Flexpen) as prescribed with meals. Do not take bedtime does of this medicine.   . THE MORNING OF SURGERY, take  NONE  . The day of surgery, do not take other diabetes injectables, including Byetta (exenatide), Bydureon (exenatide ER), Victoza (liraglutide), or Trulicity (dulaglutide).  . If your CBG is greater than 220 mg/dL, you may take  of your sliding scale  . (correction) dose of insulin.

## 2018-12-30 NOTE — Progress Notes (Signed)
Requested by:  Haywood Pao, MD [address] [place],  [place] [address]  Reason for consultation: New access   History of Present Illness   Alex Morrison is a 48 y.o. (22-Jan-1970) male who presents for evaluation for permanent access.  The patient is right hand dominant.  The patient has not had previous access procedures.  He is currently dialyzing via right IJ tunneled dialysis catheter on a Monday Wednesday Friday schedule.  He does not take any blood thinners.  He had been on peritoneal dialysis however was converted to hemodialysis.  He is scheduled for surgery tomorrow at [facility] long with general surgery for removal of PD dialysis catheter.  Patient has had Covid 19 virus however has been asymptomatic without fever or cough for the past 2 weeks.  Past Medical History:  Diagnosis Date  . Abscess of buttock   . Acute kidney injury ([place]) 09/27/2013  . Diabetes mellitus without complication ([place])   . ESRD (end stage renal disease) ([place])   . HTN (hypertension) 08/08/2018  . Perforated appendix    Peritonitis    Past Surgical History:  Procedure Laterality Date  . Ex lap with ileocecectomy  09/27/13   Dr. Brantley Stage  . INCISION AND DRAINAGE    . INCISIONAL HERNIA REPAIR N/A 08/11/2018   Procedure: OPEN RETRORECTUS REPAIR OF INCARCERATED INCISIONAL HERNIA WITH BILATERAL POSTERIOR COMPONENT SEPARATION;  Surgeon: [name], MD;  Location: [place];  Service: General;  Laterality: N/A;  . INSERTION OF MESH  08/11/2018   Procedure: INSERTION OF PHASIX MESH;  Surgeon: [name], MD;  Location: [place];  Service: General;;  . insertion of PD catheter  12/2017  . IR FLUORO GUIDE CV LINE RIGHT  08/08/2018  . IR US GUIDE VASC ACCESS RIGHT  08/08/2018  . LAPAROSCOPIC ABDOMINAL EXPLORATION N/A 09/27/2013  . LAPAROSCOPIC ASSISTED VENTRAL HERNIA REPAIR  2019  . LAPAROSCOPY N/A 09/27/2013   Procedure: LAPAROSCOPY DIAGNOSTIC;  Surgeon: Erroll Luna, MD;  Location: [place];  Service: General;   Laterality: N/A;  . LAPAROTOMY  08/11/2018   Procedure: EXPLORATORY LAPAROTOMY;  Surgeon: [name], MD;  Location: [place];  Service: General;;  . LYSIS OF ADHESION  08/11/2018   Procedure: LYSIS OF ADHESIONS X 30 MINUTES;  Surgeon: [name], MD;  Location: [place];  Service: General;;  . SHOULDER SURGERY  1989    Social History   Socioeconomic History  . Marital status: Single    Spouse name: Not on file  . Number of children: Not on file  . Years of education: Not on file  . Highest education level: Not on file  Occupational History  . Not on file  Tobacco Use  . Smoking status: Never Smoker  . Smokeless tobacco: Never Used  Substance and Sexual Activity  . Alcohol use: No  . Drug use: No  . Sexual activity: Not on file  Other Topics Concern  . Not on file  Social History Narrative  . Not on file   Social Determinants of Health   Financial Resource Strain:   . Difficulty of Paying Living Expenses: Not on file  Food Insecurity:   . Worried About Charity fundraiser in the Last Year: Not on file  . Ran Out of Food in the Last Year: Not on file  Transportation Needs:   . Lack of Transportation (Medical): Not on file  . Lack of Transportation (Non-Medical): Not on file  Physical Activity:   . Days of Exercise  per Week: Not on file  . Minutes of Exercise per Session: Not on file  Stress:   . Feeling of Stress : Not on file  Social Connections:   . Frequency of Communication with Friends and Family: Not on file  . Frequency of Social Gatherings with Friends and Family: Not on file  . Attends Religious Services: Not on file  . Active Member of Clubs or Organizations: Not on file  . Attends Archivist Meetings: Not on file  . Marital Status: Not on file  Intimate Partner Violence:   . Fear of Current or Ex-Partner: Not on file  . Emotionally Abused: Not on file  . Physically Abused: Not on file  . Sexually Abused: Not on file    Family History    Problem Relation Age of Onset  . Diabetes Mellitus II Mother   . Diabetes Mother   . Heart disease Mother   . Diabetes Mellitus I Father   . Diabetes Father   . Diabetes Mellitus II Brother   . Diabetes Brother     Current Outpatient Medications  Medication Sig Dispense Refill  . acetaminophen (TYLENOL) 500 MG tablet Take 2 tablets (1,000 mg total) by mouth every 8 (eight) hours as needed for mild pain, moderate pain or fever. 30 tablet 0  . insulin aspart (NOVOLOG FLEXPEN) 100 UNIT/ML FlexPen Inject 3-7 Units into the skin 3 (three) times daily with meals.     . Insulin Glargine (LANTUS SOLOSTAR) 100 UNIT/ML Solostar Pen Inject 10 Units into the skin at bedtime.      No current facility-administered medications for this visit.    No Known Allergies  REVIEW OF SYSTEMS (negative unless checked):   Cardiac:  []  Chest pain or chest pressure? []  Shortness of breath upon activity? []  Shortness of breath when lying flat? []  Irregular heart rhythm?  Vascular:  []  Pain in calf, thigh, or hip brought on by walking? []  Pain in feet at night that wakes you up from your sleep? []  Blood clot in your veins? []  Leg swelling?  Pulmonary:  []  Oxygen at home? []  Productive cough? []  Wheezing?  Neurologic:  []  Sudden weakness in arms or legs? []  Sudden numbness in arms or legs? []  Sudden onset of difficult speaking or slurred speech? []  Temporary loss of vision in one eye? []  Problems with dizziness?  Gastrointestinal:  []  Blood in stool? []  Vomited blood?  Genitourinary:  []  Burning when urinating? []  Blood in urine?  Psychiatric:  []  Major depression  Hematologic:  []  Bleeding problems? []  Problems with blood clotting?  Dermatologic:  []  Rashes or ulcers?  Constitutional:  []  Fever or chills?  Ear/Nose/Throat:  []  Change in hearing? []  Nose bleeds? []  Sore throat?  Musculoskeletal:  []  Back pain? []  Joint pain? []  Muscle pain?   Physical Examination      Vitals:   12/30/18 1532  BP: (!) 161/93  Pulse: 84  Resp: 16  Temp: 97.7 F (36.5 C)  TempSrc: Oral  SpO2: 98%  Weight: 208 lb (94.3 kg)  Height: 5\' 11"  (1.803 m)   Body mass index is 29.01 kg/m.  General Alert, O x 3, WD, NAD  Head /AT,    Neck Supple, mid-line trachea,    Pulmonary Sym exp, good B air movt, CTA B  Cardiac RRR, Nl S1, S2  Vascular Vessel Right Left  Radial Palpable Palpable    Gastro- intestinal soft, non-distended, non-tender to palpation, PD catheter unremarkable  Musculo- skeletal M/S  5/5 throughout  , Extremities without ischemic changes  , No edema present, No visible varicosities ,   Neurologic Cranial nerves 2-12 intact , Pain and light touch intact in extremities , Motor exam as listed above  Psychiatric Judgement intact, Mood & affect appropriate for pt's clinical situation  Dermatologic See M/S exam for extremity exam, No rashes otherwise noted  Lymphatic  Palpable lymph nodes: None     Non-invasive Vascular Imaging   BUE Vein Mapping  (Date: 12/30/2018):   R arm: acceptable vein conduits include basilic and cephalic  L arm: acceptable vein conduits include basilic and cephalic  BUE Doppler (Date: 12/30/2018):   R arm:   Brachial: tri,  Radial: tri,   Ulnar: tri,   L arm:   Brachial: tri,   Radial: tri,  Ulnar: tri,    Medical Decision Making   Alex Morrison is a 48 y.o. male who presents with end stage renal disease   Patient is dialyzing via right IJ TDC without complication and is in need of a permanent dialysis access  Patient is right arm dominant and would prefer access placement in left arm  Acceptable conduits in left arm include cephalic and basilic veins  Plan will be for left arm arteriovenous fistula creation on a next available nondialysis day  I educated patient to avoid venous puncture in left arm to preserve fistula conduits Risk, benefits, and alternatives to access surgery were discussed.    The patient is aware the risks include but are not limited to: bleeding, infection, steal syndrome, nerve damage, thrombosis, failure to mature, and need for additional procedures.   The patient agrees to proceed with the procedure.   Dagoberto Ligas PA-C [facility] [place] Office: [phone]  12/30/2018, 3:54 PM  Clinic MD: Carlis Abbott

## 2018-12-31 ENCOUNTER — Encounter (HOSPITAL_COMMUNITY): Payer: Self-pay

## 2018-12-31 ENCOUNTER — Inpatient Hospital Stay (HOSPITAL_COMMUNITY)
Admission: RE | Admit: 2018-12-31 | Discharge: 2018-12-31 | Disposition: A | Discharge status: Home / Self Care | Payer: BC Managed Care – PPO | Source: Ambulatory Visit

## 2018-12-31 ENCOUNTER — Other Ambulatory Visit: Payer: Self-pay

## 2018-12-31 ENCOUNTER — Other Ambulatory Visit (HOSPITAL_COMMUNITY): Payer: Self-pay | Admitting: *Deleted

## 2018-12-31 DIAGNOSIS — Z992 Dependence on renal dialysis: Secondary | ICD-10-CM | POA: Diagnosis not present

## 2018-12-31 DIAGNOSIS — N2581 Secondary hyperparathyroidism of renal origin: Secondary | ICD-10-CM | POA: Diagnosis not present

## 2018-12-31 DIAGNOSIS — N186 End stage renal disease: Secondary | ICD-10-CM | POA: Diagnosis not present

## 2019-01-01 ENCOUNTER — Ambulatory Visit: Payer: Self-pay | Admitting: General Surgery

## 2019-01-02 ENCOUNTER — Other Ambulatory Visit (HOSPITAL_COMMUNITY): Payer: BC Managed Care – PPO

## 2019-01-02 ENCOUNTER — Other Ambulatory Visit (HOSPITAL_COMMUNITY)
Admission: RE | Admit: 2019-01-02 | Discharge: 2019-01-02 | Disposition: A | Discharge status: Home / Self Care | Payer: BC Managed Care – PPO | Source: Ambulatory Visit | Attending: General Surgery | Admitting: General Surgery

## 2019-01-02 ENCOUNTER — Encounter (HOSPITAL_COMMUNITY)
Admission: RE | Admit: 2019-01-02 | Discharge: 2019-01-02 | Disposition: A | Discharge status: Home / Self Care | Payer: BC Managed Care – PPO | Source: Ambulatory Visit | Attending: General Surgery | Admitting: General Surgery

## 2019-01-02 ENCOUNTER — Other Ambulatory Visit: Payer: Self-pay

## 2019-01-02 DIAGNOSIS — Z992 Dependence on renal dialysis: Secondary | ICD-10-CM | POA: Diagnosis not present

## 2019-01-02 DIAGNOSIS — Z20828 Contact with and (suspected) exposure to other viral communicable diseases: Secondary | ICD-10-CM | POA: Diagnosis not present

## 2019-01-02 DIAGNOSIS — N2581 Secondary hyperparathyroidism of renal origin: Secondary | ICD-10-CM | POA: Diagnosis not present

## 2019-01-02 DIAGNOSIS — I10 Essential (primary) hypertension: Secondary | ICD-10-CM | POA: Insufficient documentation

## 2019-01-02 DIAGNOSIS — Z01810 Encounter for preprocedural cardiovascular examination: Secondary | ICD-10-CM | POA: Diagnosis not present

## 2019-01-02 DIAGNOSIS — Z01812 Encounter for preprocedural laboratory examination: Secondary | ICD-10-CM | POA: Insufficient documentation

## 2019-01-02 DIAGNOSIS — N186 End stage renal disease: Secondary | ICD-10-CM | POA: Diagnosis not present

## 2019-01-02 LAB — HEMOGLOBIN A1C
Hgb A1c MFr Bld: 8.8 % — ABNORMAL HIGH (ref 4.8–5.6)
Mean Plasma Glucose: 205.86 mg/dL

## 2019-01-02 LAB — BASIC METABOLIC PANEL
Anion gap: 11 (ref 5–15)
BUN: 26 mg/dL — ABNORMAL HIGH (ref 6–20)
CO2: 32 mmol/L (ref 22–32)
Calcium: 9.1 mg/dL (ref 8.9–10.3)
Chloride: 94 mmol/L — ABNORMAL LOW (ref 98–111)
Creatinine, Ser: 5.2 mg/dL — ABNORMAL HIGH (ref 0.61–1.24)
GFR calc Af Amer: 14 mL/min — ABNORMAL LOW (ref >60)
GFR calc non Af Amer: 12 mL/min — ABNORMAL LOW (ref >60)
Glucose, Bld: 251 mg/dL — ABNORMAL HIGH (ref 70–99)
Potassium: 4.4 mmol/L (ref 3.5–5.1)
Sodium: 137 mmol/L (ref 135–145)

## 2019-01-02 LAB — CBC
HCT: 32.2 % — ABNORMAL LOW (ref 39.0–52.0)
Hemoglobin: 10.3 g/dL — ABNORMAL LOW (ref 13.0–17.0)
MCH: 29.1 pg (ref 26.0–34.0)
MCHC: 32 g/dL (ref 30.0–36.0)
MCV: 91 fL (ref 80.0–100.0)
Platelets: 213 10*3/uL (ref 150–400)
RBC: 3.54 MIL/uL — ABNORMAL LOW (ref 4.22–5.81)
RDW: 14.9 % (ref 11.5–15.5)
WBC: 5.8 10*3/uL (ref 4.0–10.5)
nRBC: 0 % (ref 0.0–0.2)

## 2019-01-02 LAB — GLUCOSE, CAPILLARY: Glucose-Capillary: 235 mg/dL — ABNORMAL HIGH (ref 70–99)

## 2019-01-03 LAB — NOVEL CORONAVIRUS, NAA (HOSP ORDER, SEND-OUT TO REF LAB; TAT 18-24 HRS): SARS-CoV-2, NAA: NOT DETECTED

## 2019-01-05 DIAGNOSIS — N2581 Secondary hyperparathyroidism of renal origin: Secondary | ICD-10-CM | POA: Diagnosis not present

## 2019-01-05 DIAGNOSIS — N186 End stage renal disease: Secondary | ICD-10-CM | POA: Diagnosis not present

## 2019-01-05 DIAGNOSIS — Z992 Dependence on renal dialysis: Secondary | ICD-10-CM | POA: Diagnosis not present

## 2019-01-06 ENCOUNTER — Ambulatory Visit (HOSPITAL_COMMUNITY)
Admission: RE | Admit: 2019-01-06 | Discharge: 2019-01-06 | Disposition: A | Discharge status: Home / Self Care | Payer: BC Managed Care – PPO | Attending: General Surgery | Admitting: General Surgery

## 2019-01-06 ENCOUNTER — Encounter (HOSPITAL_COMMUNITY): Payer: Self-pay | Admitting: General Surgery

## 2019-01-06 ENCOUNTER — Encounter (HOSPITAL_COMMUNITY)
Admission: RE | Disposition: A | Discharge status: Home / Self Care | Payer: Self-pay | Source: Home / Self Care | Attending: General Surgery

## 2019-01-06 ENCOUNTER — Telehealth (HOSPITAL_COMMUNITY): Payer: Self-pay | Admitting: *Deleted

## 2019-01-06 ENCOUNTER — Other Ambulatory Visit: Payer: Self-pay

## 2019-01-06 ENCOUNTER — Ambulatory Visit (HOSPITAL_COMMUNITY): Payer: BC Managed Care – PPO | Admitting: Physician Assistant

## 2019-01-06 DIAGNOSIS — N186 End stage renal disease: Secondary | ICD-10-CM | POA: Insufficient documentation

## 2019-01-06 DIAGNOSIS — Z8249 Family history of ischemic heart disease and other diseases of the circulatory system: Secondary | ICD-10-CM | POA: Diagnosis not present

## 2019-01-06 DIAGNOSIS — Z4902 Encounter for fitting and adjustment of peritoneal dialysis catheter: Secondary | ICD-10-CM | POA: Insufficient documentation

## 2019-01-06 DIAGNOSIS — Z833 Family history of diabetes mellitus: Secondary | ICD-10-CM | POA: Insufficient documentation

## 2019-01-06 DIAGNOSIS — E1122 Type 2 diabetes mellitus with diabetic chronic kidney disease: Secondary | ICD-10-CM | POA: Diagnosis not present

## 2019-01-06 DIAGNOSIS — I120 Hypertensive chronic kidney disease with stage 5 chronic kidney disease or end stage renal disease: Secondary | ICD-10-CM | POA: Diagnosis not present

## 2019-01-06 DIAGNOSIS — T82898A Other specified complication of vascular prosthetic devices, implants and grafts, initial encounter: Secondary | ICD-10-CM | POA: Diagnosis not present

## 2019-01-06 DIAGNOSIS — Z794 Long term (current) use of insulin: Secondary | ICD-10-CM | POA: Diagnosis not present

## 2019-01-06 DIAGNOSIS — I129 Hypertensive chronic kidney disease with stage 1 through stage 4 chronic kidney disease, or unspecified chronic kidney disease: Secondary | ICD-10-CM | POA: Diagnosis not present

## 2019-01-06 HISTORY — PX: MINOR REMOVAL OF PERITONEAL DIALYSIS CATHETER: SHX6397

## 2019-01-06 LAB — GLUCOSE, CAPILLARY
Glucose-Capillary: 194 mg/dL — ABNORMAL HIGH (ref 70–99)
Glucose-Capillary: 201 mg/dL — ABNORMAL HIGH (ref 70–99)

## 2019-01-06 LAB — BASIC METABOLIC PANEL
Anion gap: 14 (ref 5–15)
BUN: 52 mg/dL — ABNORMAL HIGH (ref 6–20)
CO2: 27 mmol/L (ref 22–32)
Calcium: 9.5 mg/dL (ref 8.9–10.3)
Chloride: 95 mmol/L — ABNORMAL LOW (ref 98–111)
Creatinine, Ser: 7.78 mg/dL — ABNORMAL HIGH (ref 0.61–1.24)
GFR calc Af Amer: 9 mL/min — ABNORMAL LOW (ref >60)
GFR calc non Af Amer: 7 mL/min — ABNORMAL LOW (ref >60)
Glucose, Bld: 193 mg/dL — ABNORMAL HIGH (ref 70–99)
Potassium: 4.1 mmol/L (ref 3.5–5.1)
Sodium: 136 mmol/L (ref 135–145)

## 2019-01-06 LAB — CBC
HCT: 33 % — ABNORMAL LOW (ref 39.0–52.0)
Hemoglobin: 10.5 g/dL — ABNORMAL LOW (ref 13.0–17.0)
MCH: 29.8 pg (ref 26.0–34.0)
MCHC: 31.8 g/dL (ref 30.0–36.0)
MCV: 93.8 fL (ref 80.0–100.0)
Platelets: 204 10*3/uL (ref 150–400)
RBC: 3.52 MIL/uL — ABNORMAL LOW (ref 4.22–5.81)
RDW: 16.3 % — ABNORMAL HIGH (ref 11.5–15.5)
WBC: 6.8 10*3/uL (ref 4.0–10.5)
nRBC: 0 % (ref 0.0–0.2)

## 2019-01-06 SURGERY — MINOR REMOVAL OF PERITONEAL DIALYSIS CATHETER
Anesthesia: General | Site: Abdomen

## 2019-01-06 MED ORDER — PHENYLEPHRINE 40 MCG/ML (10ML) SYRINGE FOR IV PUSH (FOR BLOOD PRESSURE SUPPORT)
PREFILLED_SYRINGE | INTRAVENOUS | Status: DC | PRN
  Administered 2019-01-06: 120 ug via INTRAVENOUS

## 2019-01-06 MED ORDER — OXYCODONE HCL 5 MG PO TABS: 5.0000 mg | ORAL_TABLET | Freq: Once | ORAL | Status: DC | PRN

## 2019-01-06 MED ORDER — ONDANSETRON HCL 4 MG/2ML IJ SOLN
INTRAMUSCULAR | Status: DC | PRN
  Administered 2019-01-06: 4 mg via INTRAVENOUS

## 2019-01-06 MED ORDER — CHLORHEXIDINE GLUCONATE CLOTH 2 % EX PADS: 6.0000 | MEDICATED_PAD | Freq: Once | CUTANEOUS | Status: DC

## 2019-01-06 MED ORDER — BUPIVACAINE HCL (PF) 0.5 % IJ SOLN
INTRAMUSCULAR | Status: AC
  Filled 2019-01-06: qty 30

## 2019-01-06 MED ORDER — LIDOCAINE 2% (20 MG/ML) 5 ML SYRINGE
INTRAMUSCULAR | Status: DC | PRN
  Administered 2019-01-06: 50 mg via INTRAVENOUS

## 2019-01-06 MED ORDER — SODIUM CHLORIDE 0.9 % IV SOLN: INTRAVENOUS | Status: DC

## 2019-01-06 MED ORDER — ACETAMINOPHEN 500 MG PO TABS
1000.0000 mg | ORAL_TABLET | ORAL | Status: AC
  Administered 2019-01-06: 1000 mg via ORAL
  Filled 2019-01-06: qty 2

## 2019-01-06 MED ORDER — ONDANSETRON HCL 4 MG/2ML IJ SOLN
INTRAMUSCULAR | Status: AC
  Filled 2019-01-06: qty 2

## 2019-01-06 MED ORDER — FENTANYL CITRATE (PF) 100 MCG/2ML IJ SOLN
INTRAMUSCULAR | Status: DC | PRN
  Administered 2019-01-06: 100 ug via INTRAVENOUS

## 2019-01-06 MED ORDER — PHENYLEPHRINE 40 MCG/ML (10ML) SYRINGE FOR IV PUSH (FOR BLOOD PRESSURE SUPPORT)
PREFILLED_SYRINGE | INTRAVENOUS | Status: AC
  Filled 2019-01-06: qty 10

## 2019-01-06 MED ORDER — HYDROMORPHONE HCL 1 MG/ML IJ SOLN: 0.2500 mg | INTRAMUSCULAR | Status: DC | PRN

## 2019-01-06 MED ORDER — CEFAZOLIN SODIUM-DEXTROSE 2-4 GM/100ML-% IV SOLN
2.0000 g | INTRAVENOUS | Status: AC
  Administered 2019-01-06: 2 g via INTRAVENOUS
  Filled 2019-01-06: qty 100

## 2019-01-06 MED ORDER — LIDOCAINE 2% (20 MG/ML) 5 ML SYRINGE
INTRAMUSCULAR | Status: AC
  Filled 2019-01-06: qty 5

## 2019-01-06 MED ORDER — PROMETHAZINE HCL 25 MG/ML IJ SOLN: 6.2500 mg | INTRAMUSCULAR | Status: DC | PRN

## 2019-01-06 MED ORDER — FENTANYL CITRATE (PF) 100 MCG/2ML IJ SOLN
INTRAMUSCULAR | Status: AC
  Filled 2019-01-06: qty 2

## 2019-01-06 MED ORDER — PROPOFOL 10 MG/ML IV BOLUS
INTRAVENOUS | Status: AC
  Filled 2019-01-06: qty 20

## 2019-01-06 MED ORDER — OXYCODONE HCL 5 MG/5ML PO SOLN: 5.0000 mg | Freq: Once | ORAL | Status: DC | PRN

## 2019-01-06 MED ORDER — 0.9 % SODIUM CHLORIDE (POUR BTL) OPTIME
TOPICAL | Status: DC | PRN
  Administered 2019-01-06: 1000 mL

## 2019-01-06 MED ORDER — PROPOFOL 10 MG/ML IV BOLUS
INTRAVENOUS | Status: DC | PRN
  Administered 2019-01-06: 50 mg via INTRAVENOUS
  Administered 2019-01-06: 150 mg via INTRAVENOUS

## 2019-01-06 MED ORDER — BUPIVACAINE HCL (PF) 0.5 % IJ SOLN
INTRAMUSCULAR | Status: DC | PRN
  Administered 2019-01-06: 8 mL

## 2019-01-06 MED ORDER — GABAPENTIN 300 MG PO CAPS
300.0000 mg | ORAL_CAPSULE | ORAL | Status: AC
  Administered 2019-01-06: 300 mg via ORAL
  Filled 2019-01-06: qty 1

## 2019-01-06 SURGICAL SUPPLY — 33 items
APL PRP STRL LF DISP 70% ISPRP (MISCELLANEOUS) ×1
APL SKNCLS STERI-STRIP NONHPOA (GAUZE/BANDAGES/DRESSINGS) ×1
BENZOIN TINCTURE PRP APPL 2/3 (GAUZE/BANDAGES/DRESSINGS) ×1 IMPLANT
BLADE CLIPPER SURG (BLADE) IMPLANT
BNDG ADH 1X3 SHEER STRL LF (GAUZE/BANDAGES/DRESSINGS) ×1 IMPLANT
BNDG ADH THN 3X1 STRL LF (GAUZE/BANDAGES/DRESSINGS) ×1
CHLORAPREP W/TINT 26 (MISCELLANEOUS) ×1 IMPLANT
COVER SURGICAL LIGHT HANDLE (MISCELLANEOUS) ×2 IMPLANT
COVER WAND RF STERILE (DRAPES) IMPLANT
DRAPE LAPAROTOMY T 98X78 PEDS (DRAPES) ×2 IMPLANT
DRAPE UTILITY XL STRL (DRAPES) ×2 IMPLANT
DRSG TEGADERM 4X4.75 (GAUZE/BANDAGES/DRESSINGS) IMPLANT
ELECT REM PT RETURN 15FT ADLT (MISCELLANEOUS) ×2 IMPLANT
GAUZE SPONGE 4X4 12PLY STRL (GAUZE/BANDAGES/DRESSINGS) IMPLANT
GLOVE BIOGEL PI IND STRL 7.5 (GLOVE) ×1 IMPLANT
GLOVE BIOGEL PI INDICATOR 7.5 (GLOVE) ×1
GLOVE SURG SS PI 7.0 STRL IVOR (GLOVE) ×2 IMPLANT
GOWN STRL REUS W/TWL LRG LVL3 (GOWN DISPOSABLE) ×4 IMPLANT
KIT BASIN OR (CUSTOM PROCEDURE TRAY) ×2 IMPLANT
KIT TURNOVER KIT A (KITS) IMPLANT
NDL HYPO 25X1 1.5 SAFETY (NEEDLE) ×1 IMPLANT
NEEDLE HYPO 25X1 1.5 SAFETY (NEEDLE) ×2 IMPLANT
PACK BASIC VI WITH GOWN DISP (CUSTOM PROCEDURE TRAY) ×2 IMPLANT
PENCIL SMOKE EVACUATOR (MISCELLANEOUS) ×1 IMPLANT
SPONGE LAP 18X18 RF (DISPOSABLE) ×2 IMPLANT
STRIP CLOSURE SKIN 1/2X4 (GAUZE/BANDAGES/DRESSINGS) ×1 IMPLANT
SUT MNCRL AB 4-0 PS2 18 (SUTURE) ×2 IMPLANT
SUT VIC AB 2-0 SH 27 (SUTURE)
SUT VIC AB 2-0 SH 27X BRD (SUTURE) ×1 IMPLANT
SUT VIC AB 3-0 SH 27 (SUTURE) ×2
SUT VIC AB 3-0 SH 27XBRD (SUTURE) ×1 IMPLANT
SYR CONTROL 10ML LL (SYRINGE) ×2 IMPLANT
TOWEL OR 17X26 10 PK STRL BLUE (TOWEL DISPOSABLE) ×2 IMPLANT

## 2019-01-06 NOTE — H&P (Signed)
Alex Morrison is an 48 y.o. male.   Chief Complaint: dialysis catheter HPI: 48 yo male was on PD via catheter but had recent hernia surgery so now the catheter is not working and he has converted to HD.  Past Medical History:  Diagnosis Date  . Abscess of buttock   . Acute kidney injury ([place]) 09/27/2013  . Diabetes mellitus without complication ([place])   . ESRD (end stage renal disease) ([place])   . HTN (hypertension) 08/08/2018  . Perforated appendix    Peritonitis    Past Surgical History:  Procedure Laterality Date  . Ex lap with ileocecectomy  09/27/13   Dr. Brantley Stage  . INCISION AND DRAINAGE    . INCISIONAL HERNIA REPAIR N/A 08/11/2018   Procedure: OPEN RETRORECTUS REPAIR OF INCARCERATED INCISIONAL HERNIA WITH BILATERAL POSTERIOR COMPONENT SEPARATION;  Surgeon: [name], MD;  Location: [facility];  Service: General;  Laterality: N/A;  . INSERTION OF MESH  08/11/2018   Procedure: INSERTION OF PHASIX MESH;  Surgeon: [name], MD;  Location: [facility];  Service: General;;  . insertion of PD catheter  12/2017  . IR FLUORO GUIDE CV LINE RIGHT  08/08/2018  . IR US GUIDE VASC ACCESS RIGHT  08/08/2018  . LAPAROSCOPIC ABDOMINAL EXPLORATION N/A 09/27/2013  . LAPAROSCOPIC ASSISTED VENTRAL HERNIA REPAIR  2019  . LAPAROSCOPY N/A 09/27/2013   Procedure: LAPAROSCOPY DIAGNOSTIC;  Surgeon: Erroll Luna, MD;  Location: [facility];  Service: General;  Laterality: N/A;  . LAPAROTOMY  08/11/2018   Procedure: EXPLORATORY LAPAROTOMY;  Surgeon: [name], MD;  Location: [facility];  Service: General;;  . LYSIS OF ADHESION  08/11/2018   Procedure: LYSIS OF ADHESIONS X 30 MINUTES;  Surgeon: [name], MD;  Location: [facility];  Service: General;;  . SHOULDER SURGERY  1989    Family History  Problem Relation Age of Onset  . Diabetes Mellitus II Mother   . Diabetes Mother   . Heart disease Mother   . Diabetes Mellitus I Father   . Diabetes Father   . Diabetes Mellitus II Brother   . Diabetes Brother    Social  History:  reports that he has never smoked. He has never used smokeless tobacco. He reports that he does not drink alcohol or use drugs.  Allergies: No Known Allergies  Medications Prior to Admission  Medication Sig Dispense Refill  . insulin aspart (NOVOLOG FLEXPEN) 100 UNIT/ML FlexPen Inject 3-7 Units into the skin 3 (three) times daily with meals.     . Insulin Glargine (LANTUS SOLOSTAR) 100 UNIT/ML Solostar Pen Inject 10 Units into the skin at bedtime.     Marland Kitchen acetaminophen (TYLENOL) 500 MG tablet Take 2 tablets (1,000 mg total) by mouth every 8 (eight) hours as needed for mild pain, moderate pain or fever. 30 tablet 0    Results for orders placed or performed during the hospital encounter of 01/06/19 (from the past 48 hour(s))  CBC     Status: Abnormal   Collection Time: 01/06/19 11:04 AM  Result Value Ref Range   WBC 6.8 4.0 - 10.5 K/uL   RBC 3.52 (L) 4.22 - 5.81 MIL/uL   Hemoglobin 10.5 (L) 13.0 - 17.0 g/dL   HCT 33.0 (L) 39.0 - 52.0 %   MCV 93.8 80.0 - 100.0 fL   MCH 29.8 26.0 - 34.0 pg   MCHC 31.8 30.0 - 36.0 g/dL   RDW 16.3 (H) 11.5 - 15.5 %   Platelets 204 150 - 400 K/uL   nRBC  0.0 0.0 - 0.2 %    Comment: Performed at [facility], [place] [address], [place], [place] [id number]  Basic metabolic panel     Status: Abnormal   Collection Time: 01/06/19 11:04 AM  Result Value Ref Range   Sodium 136 135 - 145 mmol/L   Potassium 4.1 3.5 - 5.1 mmol/L   Chloride 95 (L) 98 - 111 mmol/L   CO2 27 22 - 32 mmol/L   Glucose, Bld 193 (H) 70 - 99 mg/dL   BUN 52 (H) 6 - 20 mg/dL   Creatinine, Ser 7.78 (H) 0.61 - 1.24 mg/dL   Calcium 9.5 8.9 - 10.3 mg/dL   GFR calc non Af Amer 7 (L) >60 mL/min   GFR calc Af Amer 9 (L) >60 mL/min   Anion gap 14 5 - 15    Comment: Performed at [facility] [address] [place], [place] [address]  Glucose, capillary     Status: Abnormal   Collection Time: 01/06/19 11:07 AM  Result Value Ref Range    Glucose-Capillary 194 (H) 70 - 99 mg/dL   No results found.  Review of Systems  Constitutional: Negative for chills and fever.  HENT: Negative for hearing loss.   Respiratory: Negative for cough.   Cardiovascular: Negative for chest pain and palpitations.  Gastrointestinal: Negative for abdominal pain, nausea and vomiting.  Genitourinary: Negative for dysuria and urgency.  Musculoskeletal: Negative for myalgias and neck pain.  Skin: Negative for rash.  Neurological: Negative for dizziness and headaches.  Hematological: Does not bruise/bleed easily.  Psychiatric/Behavioral: Negative for suicidal ideas.    Blood pressure (!) 164/90, pulse 88, temperature 98.5 F (36.9 C), temperature source Oral, resp. rate 18, height 5\' 11"  (1.803 m), weight 94.8 kg, SpO2 100 %. Physical Exam  Nursing note and vitals reviewed. Constitutional: He is oriented to person, place, and time. He appears well-developed and well-nourished.  HENT:  Head: Normocephalic and atraumatic.  Eyes: Conjunctivae and EOM are normal. No scleral icterus.  Cardiovascular: Normal rate and regular rhythm.  Respiratory: Effort normal and breath sounds normal. He has no wheezes. He has no rales. He exhibits no tenderness.  GI: Soft. He exhibits no distension. There is no abdominal tenderness. There is no rebound.  PD catheter in place  Musculoskeletal:        General: No edema. Normal range of motion.     Cervical back: Normal range of motion and neck supple.  Neurological: He is alert and oriented to person, place, and time.  Skin: Skin is warm and dry.  Psychiatric: He has a normal mood and affect. His behavior is normal.     Assessment/Plan 48 yo male with PD catheter in place -removal of PD catheter as outpatient  Mickeal Skinner, MD 01/06/2019, 11:53 AM

## 2019-01-06 NOTE — Op Note (Signed)
Preoperative diagnosis: peritoneal dialysis dysfunction  Postoperative diagnosis: same   Procedure: removal of peritoneal dialysis catheter  Surgeon: Gurney Maxin, M.D.  Asst: none  Anesthesia: general  Indications for procedure: Alex Morrison is a 48 y.o. year old male with symptoms of dysfunction of peritoneal dialysis. He has now converted to hemodialysis.  Description of procedure: The patient was brought into the operative suite. Anesthesia was administered with General LMA anesthesia. WHO checklist was applied. The patient was then placed in supine position. The area was prepped and draped in the usual sterile fashion.  Next, marcaine was infused over the scar in the right upper quadrant. Incision was made through the scar. Blunt dissection was performed to isolate the catheter. Cautery and blunt dissection were used to free scar from 3 fibrous balls along the catheter and finally the catheter came free. A small portion of skin around the exit site was removed. The wound was inspected and cautery was applied for hemostasis. The larger incision was closed with 3-0 vicryl in the deep dermal space. The skin of both incisions was closed with 4-0 monocryl subcuticular stitch. Bandage was put in place. The patient tolerated the procedure well and was brought to pacu in stable condition.  Findings: intact peritoneal dialysis catheter  Specimen: none  Implant: none   Blood loss: 10 ml  Local anesthesia: 8 ml marcaine   Complications: none  Gurney Maxin, M.D. General, Bariatric, & Minimally Invasive Surgery [facility]

## 2019-01-06 NOTE — Anesthesia Procedure Notes (Signed)
Procedure Name: LMA Insertion Date/Time: 01/06/2019 12:33 PM Performed by: Niel Hummer, CRNA Pre-anesthesia Checklist: Patient identified, Emergency Drugs available, Suction available and Patient being monitored Patient Re-evaluated:Patient Re-evaluated prior to induction Oxygen Delivery Method: Circle system utilized Preoxygenation: Pre-oxygenation with 100% oxygen Induction Type: IV induction LMA: LMA inserted LMA Size: 4.0 Dental Injury: Teeth and Oropharynx as per pre-operative assessment

## 2019-01-06 NOTE — Anesthesia Postprocedure Evaluation (Signed)
Anesthesia Post Note  Patient: Alex Morrison  Procedure(s) Performed: REMOVAL OF PERITONEAL DIALYSIS CATHETER (N/A Abdomen)     Patient location during evaluation: PACU Anesthesia Type: General Level of consciousness: awake and alert Pain management: pain level controlled Vital Signs Assessment: post-procedure vital signs reviewed and stable Respiratory status: spontaneous breathing, nonlabored ventilation and respiratory function stable Cardiovascular status: blood pressure returned to baseline and stable Postop Assessment: no apparent nausea or vomiting Anesthetic complications: no    Last Vitals:  Vitals:   01/06/19 1400 01/06/19 1426  BP: (!) 156/92 (!) 161/87  Pulse: 76 77  Resp: (!) 9 18  Temp:  36.7 C  SpO2: 99% 99%    Last Pain:  Vitals:   01/06/19 1426  TempSrc:   PainSc: 0-No pain                 Lynda Rainwater

## 2019-01-06 NOTE — Transfer of Care (Signed)
Immediate Anesthesia Transfer of Care Note  Patient: Alex Morrison  Procedure(s) Performed: REMOVAL OF PERITONEAL DIALYSIS CATHETER (N/A Abdomen)  Patient Location: PACU  Anesthesia Type:General  Level of Consciousness: awake  Airway & Oxygen Therapy: Patient Spontanous Breathing and Patient connected to face mask oxygen  Post-op Assessment: Report given to RN, Post -op Vital signs reviewed and stable and Patient moving all extremities X 4  Post vital signs: Reviewed and stable  Last Vitals:  Vitals Value Taken Time  BP    Temp    Pulse 75 01/06/19 1321  Resp 12 01/06/19 1321  SpO2 100 % 01/06/19 1321  Vitals shown include unvalidated device data.  Last Pain:  Vitals:   01/06/19 1124  TempSrc:   PainSc: 0-No pain      Patients Stated Pain Goal: 4 ([phone])  Complications: No apparent anesthesia complications

## 2019-01-06 NOTE — Anesthesia Preprocedure Evaluation (Signed)
Anesthesia Evaluation  Patient identified by MRN, date of birth, ID band Patient awake    Reviewed: Allergy & Precautions, NPO status , Patient's Chart, lab work & pertinent test results, reviewed documented beta blocker date and time   History of Anesthesia Complications Negative for: history of anesthetic complications  Airway Mallampati: I  TM Distance: >3 FB Neck ROM: Full    Dental  (+) Dental Advisory Given   Pulmonary  08/07/2018 SARS coronavirus neg   breath sounds clear to auscultation       Cardiovascular hypertension, Pt. on medications and Pt. on home beta blockers (-) angina Rhythm:Regular Rate:Normal  '16 ECHO: No reversible ischemia. LVEF 59% with normal wall motion. This is a low risk study.   Neuro/Psych negative neurological ROS     GI/Hepatic Neg liver ROS, Incarcerated ventral hernia/SBO, no longer vomiting now that has NG   Endo/Other  diabetes, Insulin Dependent  Renal/GU Dialysis and ESRFRenal disease (peritoneal dialysis, K+ 3.6)     Musculoskeletal   Abdominal   Peds  Hematology  (+) Blood dyscrasia (Hb 8.8, plt 132k), anemia ,   Anesthesia Other Findings   Reproductive/Obstetrics                             Anesthesia Physical  Anesthesia Plan  ASA: IV  Anesthesia Plan: General   Post-op Pain Management:    Induction: Intravenous  PONV Risk Score and Plan: 3 and Ondansetron, Dexamethasone, Treatment may vary due to age or medical condition and Midazolam  Airway Management Planned: Oral ETT and LMA  Additional Equipment:   Intra-op Plan:   Post-operative Plan: Extubation in OR  Informed Consent: I have reviewed the patients History and Physical, chart, labs and discussed the procedure including the risks, benefits and alternatives for the proposed anesthesia with the patient or authorized representative who has indicated his/her understanding and  acceptance.     Dental advisory given  Plan Discussed with: CRNA and Surgeon  Anesthesia Plan Comments:         Anesthesia Quick Evaluation

## 2019-01-07 ENCOUNTER — Encounter: Payer: Self-pay | Admitting: *Deleted

## 2019-01-07 ENCOUNTER — Other Ambulatory Visit: Payer: Self-pay

## 2019-01-07 DIAGNOSIS — N186 End stage renal disease: Secondary | ICD-10-CM | POA: Diagnosis not present

## 2019-01-07 DIAGNOSIS — Z992 Dependence on renal dialysis: Secondary | ICD-10-CM | POA: Diagnosis not present

## 2019-01-07 DIAGNOSIS — N2581 Secondary hyperparathyroidism of renal origin: Secondary | ICD-10-CM | POA: Diagnosis not present

## 2019-01-07 NOTE — Progress Notes (Addendum)
Patient was given pre-op instructions over the phone. The opportunity was given for the patient to ask questions. No further questions asked. Patient verbalized understanding of instructions given.   Nothing to eat or drink after midnight.  7 days prior to surgery STOP taking any Aspirin (unless otherwise instructed by your surgeon), Aleve, Naproxen, Ibuprofen, Motrin, Advil, Goody's, BC's, all herbal medications, fish oil, and all vitamins.  Patient given DM instructions for checking CBG morning of surgery and instructions on treating a CBG <70 or >220. Fasting CBG is 150-170 and patient checks his CBG 2-3 times/day  COVID test scheduled for 01/10/2019 at 12:10  Anesthesia Review Needed: yes, A1C 8.8 on 01/02/2019

## 2019-01-10 ENCOUNTER — Other Ambulatory Visit (HOSPITAL_COMMUNITY)
Admission: RE | Admit: 2019-01-10 | Discharge: 2019-01-10 | Disposition: A | Discharge status: Home / Self Care | Payer: BC Managed Care – PPO | Source: Ambulatory Visit | Attending: Surgery | Admitting: Surgery

## 2019-01-10 DIAGNOSIS — Z01812 Encounter for preprocedural laboratory examination: Secondary | ICD-10-CM | POA: Insufficient documentation

## 2019-01-10 DIAGNOSIS — N2581 Secondary hyperparathyroidism of renal origin: Secondary | ICD-10-CM | POA: Diagnosis not present

## 2019-01-10 DIAGNOSIS — N186 End stage renal disease: Secondary | ICD-10-CM | POA: Diagnosis not present

## 2019-01-10 DIAGNOSIS — Z20828 Contact with and (suspected) exposure to other viral communicable diseases: Secondary | ICD-10-CM | POA: Insufficient documentation

## 2019-01-10 DIAGNOSIS — Z992 Dependence on renal dialysis: Secondary | ICD-10-CM | POA: Diagnosis not present

## 2019-01-10 LAB — SARS CORONAVIRUS 2 (TAT 6-24 HRS): SARS Coronavirus 2: NEGATIVE

## 2019-01-12 DIAGNOSIS — Z992 Dependence on renal dialysis: Secondary | ICD-10-CM | POA: Diagnosis not present

## 2019-01-12 DIAGNOSIS — N186 End stage renal disease: Secondary | ICD-10-CM | POA: Diagnosis not present

## 2019-01-12 DIAGNOSIS — N2581 Secondary hyperparathyroidism of renal origin: Secondary | ICD-10-CM | POA: Diagnosis not present

## 2019-01-13 ENCOUNTER — Encounter (HOSPITAL_COMMUNITY): Payer: Self-pay | Admitting: Vascular Surgery

## 2019-01-13 ENCOUNTER — Encounter (HOSPITAL_COMMUNITY)
Admission: RE | Disposition: A | Discharge status: Home / Self Care | Payer: Self-pay | Source: Home / Self Care | Attending: Vascular Surgery

## 2019-01-13 ENCOUNTER — Ambulatory Visit (HOSPITAL_COMMUNITY): Payer: BC Managed Care – PPO | Admitting: Physician Assistant

## 2019-01-13 ENCOUNTER — Other Ambulatory Visit: Payer: Self-pay

## 2019-01-13 ENCOUNTER — Ambulatory Visit (HOSPITAL_COMMUNITY)
Admission: RE | Admit: 2019-01-13 | Discharge: 2019-01-13 | Disposition: A | Discharge status: Home / Self Care | Payer: BC Managed Care – PPO | Attending: Vascular Surgery | Admitting: Vascular Surgery

## 2019-01-13 DIAGNOSIS — N186 End stage renal disease: Secondary | ICD-10-CM | POA: Insufficient documentation

## 2019-01-13 DIAGNOSIS — Z794 Long term (current) use of insulin: Secondary | ICD-10-CM | POA: Diagnosis not present

## 2019-01-13 DIAGNOSIS — I120 Hypertensive chronic kidney disease with stage 5 chronic kidney disease or end stage renal disease: Secondary | ICD-10-CM | POA: Insufficient documentation

## 2019-01-13 DIAGNOSIS — E1122 Type 2 diabetes mellitus with diabetic chronic kidney disease: Secondary | ICD-10-CM | POA: Diagnosis not present

## 2019-01-13 DIAGNOSIS — Z992 Dependence on renal dialysis: Secondary | ICD-10-CM | POA: Diagnosis not present

## 2019-01-13 HISTORY — PX: AV FISTULA PLACEMENT: SHX1204

## 2019-01-13 LAB — POCT I-STAT, CHEM 8
BUN: 55 mg/dL — ABNORMAL HIGH (ref 6–20)
Calcium, Ion: 0.81 mmol/L — CL (ref 1.15–1.40)
Chloride: 102 mmol/L (ref 98–111)
Creatinine, Ser: 7.9 mg/dL — ABNORMAL HIGH (ref 0.61–1.24)
Glucose, Bld: 162 mg/dL — ABNORMAL HIGH (ref 70–99)
HCT: 31 % — ABNORMAL LOW (ref 39.0–52.0)
Hemoglobin: 10.5 g/dL — ABNORMAL LOW (ref 13.0–17.0)
Potassium: 5.5 mmol/L — ABNORMAL HIGH (ref 3.5–5.1)
Sodium: 130 mmol/L — ABNORMAL LOW (ref 135–145)
TCO2: 28 mmol/L (ref 22–32)

## 2019-01-13 LAB — GLUCOSE, CAPILLARY
Glucose-Capillary: 160 mg/dL — ABNORMAL HIGH (ref 70–99)
Glucose-Capillary: 147 mg/dL — ABNORMAL HIGH (ref 70–99)
Glucose-Capillary: 174 mg/dL — ABNORMAL HIGH (ref 70–99)

## 2019-01-13 SURGERY — ARTERIOVENOUS (AV) FISTULA CREATION
Anesthesia: Monitor Anesthesia Care | Laterality: Left

## 2019-01-13 MED ORDER — OXYCODONE HCL 5 MG/5ML PO SOLN: 5.0000 mg | Freq: Once | ORAL | Status: DC | PRN

## 2019-01-13 MED ORDER — LIDOCAINE HCL (PF) 1 % IJ SOLN
INTRAMUSCULAR | Status: DC | PRN
  Administered 2019-01-13: 10 mL

## 2019-01-13 MED ORDER — CHLORHEXIDINE GLUCONATE 4 % EX LIQD: 60.0000 mL | Freq: Once | CUTANEOUS | Status: DC

## 2019-01-13 MED ORDER — MIDAZOLAM HCL 2 MG/2ML IJ SOLN
INTRAMUSCULAR | Status: AC
  Filled 2019-01-13: qty 2

## 2019-01-13 MED ORDER — HYDROCODONE-ACETAMINOPHEN 5-325 MG PO TABS: 1.0000 | ORAL_TABLET | Freq: Four times a day (QID) | ORAL | 0 refills | Status: DC | PRN

## 2019-01-13 MED ORDER — HEPARIN SODIUM (PORCINE) 1000 UNIT/ML IJ SOLN
INTRAMUSCULAR | Status: AC
  Filled 2019-01-13: qty 1

## 2019-01-13 MED ORDER — HEPARIN SODIUM (PORCINE) 1000 UNIT/ML IJ SOLN
INTRAMUSCULAR | Status: DC | PRN
  Administered 2019-01-13: 5000 [IU] via INTRAVENOUS

## 2019-01-13 MED ORDER — OXYCODONE HCL 5 MG PO TABS: 5.0000 mg | ORAL_TABLET | Freq: Once | ORAL | Status: DC | PRN

## 2019-01-13 MED ORDER — SODIUM CHLORIDE 0.9 % IV SOLN: INTRAVENOUS | Status: DC

## 2019-01-13 MED ORDER — SODIUM CHLORIDE 0.9 % IV SOLN
INTRAVENOUS | Status: DC | PRN
  Administered 2019-01-13: 500 mL

## 2019-01-13 MED ORDER — PROTAMINE SULFATE 10 MG/ML IV SOLN
INTRAVENOUS | Status: AC
  Filled 2019-01-13: qty 5

## 2019-01-13 MED ORDER — CEFAZOLIN SODIUM-DEXTROSE 2-4 GM/100ML-% IV SOLN
2.0000 g | INTRAVENOUS | Status: AC
  Administered 2019-01-13: 2 g via INTRAVENOUS
  Filled 2019-01-13: qty 100

## 2019-01-13 MED ORDER — PROPOFOL 500 MG/50ML IV EMUL
INTRAVENOUS | Status: DC | PRN
  Administered 2019-01-13: 75 ug/kg/min via INTRAVENOUS

## 2019-01-13 MED ORDER — FENTANYL CITRATE (PF) 250 MCG/5ML IJ SOLN
INTRAMUSCULAR | Status: DC | PRN
  Administered 2019-01-13: 100 ug via INTRAVENOUS

## 2019-01-13 MED ORDER — MEPERIDINE HCL 25 MG/ML IJ SOLN: 6.2500 mg | INTRAMUSCULAR | Status: DC | PRN

## 2019-01-13 MED ORDER — ONDANSETRON HCL 4 MG/2ML IJ SOLN: 4.0000 mg | Freq: Once | INTRAMUSCULAR | Status: DC | PRN

## 2019-01-13 MED ORDER — LIDOCAINE 2% (20 MG/ML) 5 ML SYRINGE
INTRAMUSCULAR | Status: AC
  Filled 2019-01-13: qty 10

## 2019-01-13 MED ORDER — ACETAMINOPHEN 160 MG/5ML PO SOLN: 325.0000 mg | ORAL | Status: DC | PRN

## 2019-01-13 MED ORDER — PHENYLEPHRINE HCL-NACL 10-0.9 MG/250ML-% IV SOLN
INTRAVENOUS | Status: DC | PRN
  Administered 2019-01-13: 30 ug/min via INTRAVENOUS

## 2019-01-13 MED ORDER — FENTANYL CITRATE (PF) 100 MCG/2ML IJ SOLN: 25.0000 ug | INTRAMUSCULAR | Status: DC | PRN

## 2019-01-13 MED ORDER — 0.9 % SODIUM CHLORIDE (POUR BTL) OPTIME
TOPICAL | Status: DC | PRN
  Administered 2019-01-13: 1000 mL

## 2019-01-13 MED ORDER — DEXMEDETOMIDINE HCL 200 MCG/2ML IV SOLN
INTRAVENOUS | Status: DC | PRN
  Administered 2019-01-13 (×2): 8 ug via INTRAVENOUS

## 2019-01-13 MED ORDER — LIDOCAINE HCL (PF) 1 % IJ SOLN
INTRAMUSCULAR | Status: AC
  Filled 2019-01-13: qty 30

## 2019-01-13 MED ORDER — LIDOCAINE 2% (20 MG/ML) 5 ML SYRINGE
INTRAMUSCULAR | Status: DC | PRN
  Administered 2019-01-13: 40 mg via INTRAVENOUS

## 2019-01-13 MED ORDER — MIDAZOLAM HCL 5 MG/5ML IJ SOLN
INTRAMUSCULAR | Status: DC | PRN
  Administered 2019-01-13: 2 mg via INTRAVENOUS

## 2019-01-13 MED ORDER — ACETAMINOPHEN 325 MG PO TABS: 325.0000 mg | ORAL_TABLET | ORAL | Status: DC | PRN

## 2019-01-13 MED ORDER — FENTANYL CITRATE (PF) 250 MCG/5ML IJ SOLN
INTRAMUSCULAR | Status: AC
  Filled 2019-01-13: qty 5

## 2019-01-13 MED ORDER — PHENYLEPHRINE 40 MCG/ML (10ML) SYRINGE FOR IV PUSH (FOR BLOOD PRESSURE SUPPORT)
PREFILLED_SYRINGE | INTRAVENOUS | Status: AC
  Filled 2019-01-13: qty 20

## 2019-01-13 MED ORDER — SODIUM CHLORIDE 0.9 % IV SOLN
INTRAVENOUS | Status: AC
  Filled 2019-01-13: qty 1.2

## 2019-01-13 SURGICAL SUPPLY — 40 items
ADH SKN CLS APL DERMABOND .7 (GAUZE/BANDAGES/DRESSINGS) ×1
ARMBAND PINK RESTRICT EXTREMIT (MISCELLANEOUS) ×3 IMPLANT
CANISTER SUCT 3000ML PPV (MISCELLANEOUS) ×2 IMPLANT
CANNULA VESSEL 3MM 2 BLNT TIP (CANNULA) ×2 IMPLANT
CLIP VESOCCLUDE MED 6/CT (CLIP) ×3 IMPLANT
CLIP VESOCCLUDE SM WIDE 6/CT (CLIP) ×2 IMPLANT
COVER PROBE W GEL 5X96 (DRAPES) IMPLANT
DECANTER SPIKE VIAL GLASS SM (MISCELLANEOUS) ×2 IMPLANT
DERMABOND ADVANCED (GAUZE/BANDAGES/DRESSINGS) ×1
DERMABOND ADVANCED .7 DNX12 (GAUZE/BANDAGES/DRESSINGS) ×1 IMPLANT
DRAIN PENROSE 1/4X12 LTX STRL (WOUND CARE) ×2 IMPLANT
ELECT REM PT RETURN 9FT ADLT (ELECTROSURGICAL) ×2
ELECTRODE REM PT RTRN 9FT ADLT (ELECTROSURGICAL) ×1 IMPLANT
GLOVE BIO SURGEON STRL SZ 6.5 (GLOVE) ×1 IMPLANT
GLOVE BIO SURGEON STRL SZ7.5 (GLOVE) ×3 IMPLANT
GLOVE BIOGEL PI IND STRL 6.5 (GLOVE) IMPLANT
GLOVE BIOGEL PI IND STRL 8 (GLOVE) IMPLANT
GLOVE BIOGEL PI INDICATOR 6.5 (GLOVE) ×4
GLOVE BIOGEL PI INDICATOR 8 (GLOVE) ×1
GLOVE ECLIPSE 8.0 STRL XLNG CF (GLOVE) ×1 IMPLANT
GLOVE SURG SS PI 6.5 STRL IVOR (GLOVE) ×1 IMPLANT
GOWN STRL REUS W/ TWL LRG LVL3 (GOWN DISPOSABLE) ×3 IMPLANT
GOWN STRL REUS W/ TWL XL LVL3 (GOWN DISPOSABLE) IMPLANT
GOWN STRL REUS W/TWL LRG LVL3 (GOWN DISPOSABLE) ×6
GOWN STRL REUS W/TWL XL LVL3 (GOWN DISPOSABLE) ×2
KIT BASIN OR (CUSTOM PROCEDURE TRAY) ×2 IMPLANT
KIT TURNOVER KIT B (KITS) ×2 IMPLANT
LOOP VESSEL MINI RED (MISCELLANEOUS) IMPLANT
NS IRRIG 1000ML POUR BTL (IV SOLUTION) ×2 IMPLANT
PACK CV ACCESS (CUSTOM PROCEDURE TRAY) ×2 IMPLANT
PAD ARMBOARD 7.5X6 YLW CONV (MISCELLANEOUS) ×4 IMPLANT
SPONGE SURGIFOAM ABS GEL 100 (HEMOSTASIS) IMPLANT
SUT PROLENE 6 0 CC (SUTURE) ×5 IMPLANT
SUT PROLENE 7 0 BV 1 (SUTURE) IMPLANT
SUT VIC AB 3-0 SH 27 (SUTURE) ×2
SUT VIC AB 3-0 SH 27X BRD (SUTURE) ×1 IMPLANT
SUT VICRYL 4-0 PS2 18IN ABS (SUTURE) ×2 IMPLANT
TOWEL GREEN STERILE (TOWEL DISPOSABLE) ×2 IMPLANT
UNDERPAD 30X30 (UNDERPADS AND DIAPERS) ×2 IMPLANT
WATER STERILE IRR 1000ML POUR (IV SOLUTION) ×2 IMPLANT

## 2019-01-13 NOTE — Discharge Instructions (Signed)
° °  [facility] ° °Discharge Instructions ° °AV Fistula or Graft Surgery for Dialysis Access ° °Please refer to the following instructions for your post-procedure care. Your surgeon or physician assistant will discuss any changes with you. ° °Activity ° °You may drive the day following your surgery, if you are comfortable and no longer taking prescription pain medication. Resume full activity as the soreness in your incision resolves. ° °Bathing/Showering ° °You may shower after you go home. Keep your incision dry for 48 hours. Do not soak in a bathtub, hot tub, or swim until the incision heals completely. You may not shower if you have a hemodialysis catheter. ° °Incision Care ° °Clean your incision with mild soap and water after 48 hours. Pat the area dry with a clean towel. You do not need a bandage unless otherwise instructed. Do not apply any ointments or creams to your incision. You may have skin glue on your incision. Do not peel it off. It will come off on its own in about one week. Your arm may swell a bit after surgery. To reduce swelling use pillows to elevate your arm so it is above your heart. Your doctor will tell you if you need to lightly wrap your arm with an ACE bandage. ° °Diet ° °Resume your normal diet. There are not special food restrictions following this procedure. In order to heal from your surgery, it is CRITICAL to get adequate nutrition. Your body requires vitamins, minerals, and protein. Vegetables are the best source of vitamins and minerals. Vegetables also provide the perfect balance of protein. Processed food has little nutritional value, so try to avoid this. ° °Medications ° °Resume taking all of your medications. If your incision is causing pain, you may take over-the counter pain relievers such as acetaminophen (Tylenol). If you were prescribed a stronger pain medication, please be aware these medications can cause nausea and constipation. Prevent  nausea by taking the medication with a snack or meal. Avoid constipation by drinking plenty of fluids and eating foods with high amount of fiber, such as fruits, vegetables, and grains. Do not take Tylenol if you are taking prescription pain medications. ° ° ° ° °Follow up °Your surgeon may want to see you in the office following your access surgery. If so, this will be arranged at the time of your surgery. ° °Please call us immediately for any of the following conditions: ° °Increased pain, redness, drainage (pus) from your incision site °Fever of 101 degrees or higher °Severe or worsening pain at your incision site °Hand pain or numbness. ° °Reduce your risk of vascular disease: ° °Stop smoking. If you would like help, call [facility] at 1-800-QUIT-NOW (1-800-784-8669) or [facility] at 336-586-4000 ° °Manage your cholesterol °Maintain a desired weight °Control your diabetes °Keep your blood pressure down ° °Dialysis ° °It will take several weeks to several months for your new dialysis access to be ready for use. Your surgeon will determine when it is OK to use it. Your nephrologist will continue to direct your dialysis. You can continue to use your Permcath until your new access is ready for use. ° °If you have any questions, please call the office at [phone]. ° °

## 2019-01-13 NOTE — Transfer of Care (Signed)
Immediate Anesthesia Transfer of Care Note  Patient: Darriel Utter Bouie  Procedure(s) Performed: LEFT ARM ARTERIOVENOUS (AV) FISTULA CREATION (Left )  Patient Location: PACU  Anesthesia Type:MAC  Level of Consciousness: awake, alert  and oriented  Airway & Oxygen Therapy: Patient Spontanous Breathing and Patient connected to nasal cannula oxygen  Post-op Assessment: Report given to RN, Post -op Vital signs reviewed and stable and Patient moving all extremities X 4  Post vital signs: Reviewed and stable  Last Vitals:  Vitals Value Taken Time  BP 154/74 01/13/19 1208  Temp    Pulse 73 01/13/19 1209  Resp    SpO2 98 % 01/13/19 1209  Vitals shown include unvalidated device data.  Last Pain:  Vitals:   01/13/19 0823  TempSrc:   PainSc: 0-No pain         Complications: No apparent anesthesia complications

## 2019-01-13 NOTE — Op Note (Signed)
Procedure: Left Brachial Cephalic AV fistula  Preop: ESRD  Postop: ESRD  Anesthesia: Local with MAC  Assistant: Arlee Muslim PA-C  Findings: 3.5 mm cephalic vein  Procedure: After obtaining informed consent, the patient was taken to the operating room.  After induction of general anesthesia, the left upper extremity was prepped and draped in usual sterile fashion.  A transverse incision was then made near the antecubital crease the left arm. The incision was carried into the subcutaneous tissues down to level of the cephalic vein. The cephalic vein was approximately 3.5 mm in diameter. It was of good quality. This was dissected free circumferentially and small side branches ligated and divided between silk ties or clips. Next the brachial artery was dissected free in the medial portion of the incision. The artery was  3-4 mm in diameter. It was moderately calcified.  The vessel loops were placed proximal and distal to the planned site of arteriotomy. The patient was given 5000 units of intravenous heparin. After appropriate circulation time, the vessel loops were used to control the artery. A longitudinal opening was made in the brachial artery.  The vein was ligated distally with a 2-0 silk tie. The vein was controlled proximally with a fine bulldog clamp. The vein was then swung over to the artery and sewn end of vein to side of artery using a running 6-0 Prolene suture. Just prior to completion of the anastomosis, everything was fore bled back bled and thoroughly flushed. The anastomosis was secured, vessel loops released, and there was a palpable thrill in the fistula immediately. After hemostasis was obtained, the subcutaneous tissues were reapproximated using a running 3-0 Vicryl suture. The skin was then closed with a 4 Vicryl subcuticular stitch. Dermabond was applied to the skin incision.   Ruta Hinds, MD [facility] Office: [phone] Pager:  [phone]

## 2019-01-13 NOTE — Anesthesia Procedure Notes (Signed)
Procedure Name: MAC Date/Time: 01/13/2019 10:48 AM Performed by: Mariea Clonts, CRNA Pre-anesthesia Checklist: Patient identified, Emergency Drugs available, Suction available, Patient being monitored and Timeout performed Patient Re-evaluated:Patient Re-evaluated prior to induction Oxygen Delivery Method: Simple face mask and Nasal cannula

## 2019-01-13 NOTE — Interval H&P Note (Signed)
History and Physical Interval Note:  01/13/2019 10:25 AM  Alex Morrison  has presented today for surgery, with the diagnosis of END STAGE RENAL DISEASE.  The various methods of treatment have been discussed with the patient and family. After consideration of risks, benefits and other options for treatment, the patient has consented to  Procedure(s): LEFT ARM ARTERIOVENOUS (AV) FISTULA CREATION (Left) as a surgical intervention.  The patient's history has been reviewed, patient examined, no change in status, stable for surgery.  I have reviewed the patient's chart and labs.  Questions were answered to the patient's satisfaction.     Ruta Hinds

## 2019-01-13 NOTE — Anesthesia Preprocedure Evaluation (Addendum)
Anesthesia Evaluation  Patient identified by MRN, date of birth, ID band Patient awake    Reviewed: Allergy & Precautions, NPO status , Patient's Chart, lab work & pertinent test results, reviewed documented beta blocker date and time   History of Anesthesia Complications Negative for: history of anesthetic complications  Airway Mallampati: I  TM Distance: >3 FB Neck ROM: Full    Dental  (+) Dental Advisory Given   Pulmonary neg pulmonary ROS,  08/07/2018 SARS coronavirus neg   breath sounds clear to auscultation       Cardiovascular hypertension, Pt. on medications and Pt. on home beta blockers (-) angina Rhythm:Regular Rate:Normal  '16 ECHO: No reversible ischemia. LVEF 59% with normal wall motion. This is a low risk study.   Neuro/Psych negative neurological ROS  negative psych ROS   GI/Hepatic negative GI ROS, Neg liver ROS, Incarcerated ventral hernia/SBO, no longer vomiting now that has NG   Endo/Other  diabetes, Insulin Dependent  Renal/GU Dialysis and ESRFRenal disease (peritoneal dialysis, K+ 3.6)     Musculoskeletal   Abdominal   Peds  Hematology  (+) Blood dyscrasia (Hb 8.8, plt 132k), anemia ,   Anesthesia Other Findings   Reproductive/Obstetrics                            Anesthesia Physical  Anesthesia Plan  ASA: III  Anesthesia Plan: MAC   Post-op Pain Management:    Induction:   PONV Risk Score and Plan: 2  Airway Management Planned: Nasal Cannula, Simple Face Mask and Mask  Additional Equipment:   Intra-op Plan:   Post-operative Plan:   Informed Consent: I have reviewed the patients History and Physical, chart, labs and discussed the procedure including the risks, benefits and alternatives for the proposed anesthesia with the patient or authorized representative who has indicated his/her understanding and acceptance.     Dental advisory given  Plan  Discussed with: CRNA, Surgeon and Anesthesiologist  Anesthesia Plan Comments:       Anesthesia Quick Evaluation

## 2019-01-13 NOTE — Progress Notes (Signed)
Reviewed I-STAT results with Dr. Ambrose Pancoast.  No orders received.

## 2019-01-14 DIAGNOSIS — N2581 Secondary hyperparathyroidism of renal origin: Secondary | ICD-10-CM | POA: Diagnosis not present

## 2019-01-14 DIAGNOSIS — Z992 Dependence on renal dialysis: Secondary | ICD-10-CM | POA: Diagnosis not present

## 2019-01-14 DIAGNOSIS — N186 End stage renal disease: Secondary | ICD-10-CM | POA: Diagnosis not present

## 2019-01-14 NOTE — Anesthesia Postprocedure Evaluation (Signed)
Anesthesia Post Note  Patient: Alex Morrison  Procedure(s) Performed: LEFT ARM ARTERIOVENOUS (AV) FISTULA CREATION (Left )     Patient location during evaluation: PACU Anesthesia Type: MAC Level of consciousness: awake and alert Pain management: pain level controlled Vital Signs Assessment: post-procedure vital signs reviewed and stable Respiratory status: spontaneous breathing, nonlabored ventilation, respiratory function stable and patient connected to nasal cannula oxygen Cardiovascular status: stable and blood pressure returned to baseline Postop Assessment: no apparent nausea or vomiting Anesthetic complications: no    Last Vitals:  Vitals:   01/13/19 1247 01/13/19 1249  BP:  (!) 150/81  Pulse: 77 76  Resp: 18 15  Temp:  37 C  SpO2: 97% 98%    Last Pain:  Vitals:   01/13/19 1249  TempSrc:   PainSc: 0-No pain   Pain Goal:                   Jaquilla Woodroof

## 2019-01-15 DIAGNOSIS — N186 End stage renal disease: Secondary | ICD-10-CM | POA: Diagnosis not present

## 2019-01-15 DIAGNOSIS — E1122 Type 2 diabetes mellitus with diabetic chronic kidney disease: Secondary | ICD-10-CM | POA: Diagnosis not present

## 2019-01-15 DIAGNOSIS — Z992 Dependence on renal dialysis: Secondary | ICD-10-CM | POA: Diagnosis not present

## 2019-01-17 DIAGNOSIS — N2581 Secondary hyperparathyroidism of renal origin: Secondary | ICD-10-CM | POA: Diagnosis not present

## 2019-01-17 DIAGNOSIS — N186 End stage renal disease: Secondary | ICD-10-CM | POA: Diagnosis not present

## 2019-01-17 DIAGNOSIS — Z992 Dependence on renal dialysis: Secondary | ICD-10-CM | POA: Diagnosis not present

## 2019-01-19 DIAGNOSIS — N2581 Secondary hyperparathyroidism of renal origin: Secondary | ICD-10-CM | POA: Diagnosis not present

## 2019-01-19 DIAGNOSIS — N186 End stage renal disease: Secondary | ICD-10-CM | POA: Diagnosis not present

## 2019-01-19 DIAGNOSIS — Z992 Dependence on renal dialysis: Secondary | ICD-10-CM | POA: Diagnosis not present

## 2019-01-21 DIAGNOSIS — Z992 Dependence on renal dialysis: Secondary | ICD-10-CM | POA: Diagnosis not present

## 2019-01-21 DIAGNOSIS — N186 End stage renal disease: Secondary | ICD-10-CM | POA: Diagnosis not present

## 2019-01-21 DIAGNOSIS — N2581 Secondary hyperparathyroidism of renal origin: Secondary | ICD-10-CM | POA: Diagnosis not present

## 2019-01-23 DIAGNOSIS — Z992 Dependence on renal dialysis: Secondary | ICD-10-CM | POA: Diagnosis not present

## 2019-01-23 DIAGNOSIS — N186 End stage renal disease: Secondary | ICD-10-CM | POA: Diagnosis not present

## 2019-01-23 DIAGNOSIS — N2581 Secondary hyperparathyroidism of renal origin: Secondary | ICD-10-CM | POA: Diagnosis not present

## 2019-01-26 DIAGNOSIS — N2581 Secondary hyperparathyroidism of renal origin: Secondary | ICD-10-CM | POA: Diagnosis not present

## 2019-01-26 DIAGNOSIS — N186 End stage renal disease: Secondary | ICD-10-CM | POA: Diagnosis not present

## 2019-01-26 DIAGNOSIS — Z992 Dependence on renal dialysis: Secondary | ICD-10-CM | POA: Diagnosis not present

## 2019-01-28 DIAGNOSIS — N186 End stage renal disease: Secondary | ICD-10-CM | POA: Diagnosis not present

## 2019-01-28 DIAGNOSIS — Z992 Dependence on renal dialysis: Secondary | ICD-10-CM | POA: Diagnosis not present

## 2019-01-28 DIAGNOSIS — N2581 Secondary hyperparathyroidism of renal origin: Secondary | ICD-10-CM | POA: Diagnosis not present

## 2019-01-29 DIAGNOSIS — K219 Gastro-esophageal reflux disease without esophagitis: Secondary | ICD-10-CM | POA: Diagnosis not present

## 2019-01-30 DIAGNOSIS — Z992 Dependence on renal dialysis: Secondary | ICD-10-CM | POA: Diagnosis not present

## 2019-01-30 DIAGNOSIS — N186 End stage renal disease: Secondary | ICD-10-CM | POA: Diagnosis not present

## 2019-01-30 DIAGNOSIS — N2581 Secondary hyperparathyroidism of renal origin: Secondary | ICD-10-CM | POA: Diagnosis not present

## 2019-02-02 DIAGNOSIS — Z992 Dependence on renal dialysis: Secondary | ICD-10-CM | POA: Diagnosis not present

## 2019-02-02 DIAGNOSIS — N186 End stage renal disease: Secondary | ICD-10-CM | POA: Diagnosis not present

## 2019-02-02 DIAGNOSIS — N2581 Secondary hyperparathyroidism of renal origin: Secondary | ICD-10-CM | POA: Diagnosis not present

## 2019-02-04 DIAGNOSIS — N186 End stage renal disease: Secondary | ICD-10-CM | POA: Diagnosis not present

## 2019-02-04 DIAGNOSIS — Z992 Dependence on renal dialysis: Secondary | ICD-10-CM | POA: Diagnosis not present

## 2019-02-04 DIAGNOSIS — N2581 Secondary hyperparathyroidism of renal origin: Secondary | ICD-10-CM | POA: Diagnosis not present

## 2019-02-06 DIAGNOSIS — Z992 Dependence on renal dialysis: Secondary | ICD-10-CM | POA: Diagnosis not present

## 2019-02-06 DIAGNOSIS — N2581 Secondary hyperparathyroidism of renal origin: Secondary | ICD-10-CM | POA: Diagnosis not present

## 2019-02-06 DIAGNOSIS — N186 End stage renal disease: Secondary | ICD-10-CM | POA: Diagnosis not present

## 2019-02-09 DIAGNOSIS — N186 End stage renal disease: Secondary | ICD-10-CM | POA: Diagnosis not present

## 2019-02-09 DIAGNOSIS — N2581 Secondary hyperparathyroidism of renal origin: Secondary | ICD-10-CM | POA: Diagnosis not present

## 2019-02-09 DIAGNOSIS — Z992 Dependence on renal dialysis: Secondary | ICD-10-CM | POA: Diagnosis not present

## 2019-02-11 DIAGNOSIS — Z992 Dependence on renal dialysis: Secondary | ICD-10-CM | POA: Diagnosis not present

## 2019-02-11 DIAGNOSIS — Z1331 Encounter for screening for depression: Secondary | ICD-10-CM | POA: Diagnosis not present

## 2019-02-11 DIAGNOSIS — N186 End stage renal disease: Secondary | ICD-10-CM | POA: Diagnosis not present

## 2019-02-11 DIAGNOSIS — E669 Obesity, unspecified: Secondary | ICD-10-CM | POA: Diagnosis not present

## 2019-02-11 DIAGNOSIS — E11319 Type 2 diabetes mellitus with unspecified diabetic retinopathy without macular edema: Secondary | ICD-10-CM | POA: Diagnosis not present

## 2019-02-11 DIAGNOSIS — N2581 Secondary hyperparathyroidism of renal origin: Secondary | ICD-10-CM | POA: Diagnosis not present

## 2019-02-13 DIAGNOSIS — Z992 Dependence on renal dialysis: Secondary | ICD-10-CM | POA: Diagnosis not present

## 2019-02-13 DIAGNOSIS — N186 End stage renal disease: Secondary | ICD-10-CM | POA: Diagnosis not present

## 2019-02-13 DIAGNOSIS — N2581 Secondary hyperparathyroidism of renal origin: Secondary | ICD-10-CM | POA: Diagnosis not present

## 2019-02-15 DIAGNOSIS — E1122 Type 2 diabetes mellitus with diabetic chronic kidney disease: Secondary | ICD-10-CM | POA: Diagnosis not present

## 2019-02-15 DIAGNOSIS — Z992 Dependence on renal dialysis: Secondary | ICD-10-CM | POA: Diagnosis not present

## 2019-02-15 DIAGNOSIS — N186 End stage renal disease: Secondary | ICD-10-CM | POA: Diagnosis not present

## 2019-02-16 DIAGNOSIS — Z992 Dependence on renal dialysis: Secondary | ICD-10-CM | POA: Diagnosis not present

## 2019-02-16 DIAGNOSIS — N2581 Secondary hyperparathyroidism of renal origin: Secondary | ICD-10-CM | POA: Diagnosis not present

## 2019-02-16 DIAGNOSIS — N186 End stage renal disease: Secondary | ICD-10-CM | POA: Diagnosis not present

## 2019-02-16 NOTE — Progress Notes (Signed)
  POST OPERATIVE OFFICE NOTE    CC:  F/u for surgery 5 weeks post-op  HPI:  This is a 49 y.o. male who is s/p left brachiocephalic AVF by Dr. Oneida Alar.  He denies left extremity pain or left hand pain, tingling or numbness  HD clinic: Treatment days: MWF Current access: right IJ TDC  No Known Allergies  Current Outpatient Medications  Medication Sig Dispense Refill  . acetaminophen (TYLENOL) 500 MG tablet Take 2 tablets (1,000 mg total) by mouth every 8 (eight) hours as needed for mild pain, moderate pain or fever. 30 tablet 0  . HYDROcodone-acetaminophen (NORCO) 5-325 MG tablet Take 1 tablet by mouth every 6 (six) hours as needed for moderate pain. 10 tablet 0  . insulin aspart (NOVOLOG FLEXPEN) 100 UNIT/ML FlexPen Inject 3-7 Units into the skin 3 (three) times daily with meals.     . Insulin Glargine (LANTUS SOLOSTAR) 100 UNIT/ML Solostar Pen Inject 10 Units into the skin at bedtime.      No current facility-administered medications for this visit.     ROS:  See HPI  Physical Exam:  Vitals:   02/19/19 1245  Weight: 207 lb (93.9 kg)  Height: 5\' 11"  (1.803 m)   Incision: Well approximated.  Healing without signs of infection Extremities: Good bruit and thrill in fistula.  The vein is palpable along the upper arm Neuro: Alert and oriented x4   AVF         PSV (cm/s)Flow Vol (mL/min)   Comments     +--------------------+----------+-----------------+------------------+  Native artery inflow  226      975               +--------------------+----------+-----------------+------------------+  AVF Anastomosis     849           small and tortuous  +--------------------+----------+-----------------+------------------+     +------------+----------+-------------+----------+--------------------+  OUTFLOW VEINPSV (cm/s)Diameter (cm)Depth (cm)   Describe       +------------+----------+-------------+----------+--------------------+  Prox UA     92    0.69     0.23              +------------+----------+-------------+----------+--------------------+  Mid UA     109    0.57     0.24  competing branch x 2  +------------+----------+-------------+----------+--------------------+  Dist UA     109    0.67     0.25              +------------+----------+-------------+----------+--------------------+  AC Fossa    43    0.74     0.23              +------------+----------+-------------+----------+--------------------+  Two branches in the mid upper arm measuring 0.24 cms (with a velocity of  130 cm) & 0.26 (with a velocity of 71 cm/s).       Summary:  Patent arteriovenous fistula with increased velocity at the anastamosos   Assessment/Plan:  This is a 49 y.o. male who is s/p: Left brachiocephalic AV fistula 5 weeks ago.  The diameter of the vein varies from approximately 6 to 7 mm.  It is less than 6 mm in depth.  -Continue dialysis via right IJ tunneled dialysis catheter.  May begin using fistula on April 07, 2019   Barbie Banner, PA-C [facility] [phone]  Clinic MD:  Oneida Alar

## 2019-02-18 ENCOUNTER — Other Ambulatory Visit: Payer: Self-pay

## 2019-02-18 DIAGNOSIS — N2581 Secondary hyperparathyroidism of renal origin: Secondary | ICD-10-CM | POA: Diagnosis not present

## 2019-02-18 DIAGNOSIS — Z992 Dependence on renal dialysis: Secondary | ICD-10-CM | POA: Diagnosis not present

## 2019-02-18 DIAGNOSIS — N186 End stage renal disease: Secondary | ICD-10-CM

## 2019-02-19 ENCOUNTER — Ambulatory Visit (INDEPENDENT_AMBULATORY_CARE_PROVIDER_SITE_OTHER): Payer: Self-pay | Admitting: Physician Assistant

## 2019-02-19 ENCOUNTER — Other Ambulatory Visit: Payer: Self-pay

## 2019-02-19 ENCOUNTER — Ambulatory Visit (HOSPITAL_COMMUNITY)
Admission: RE | Admit: 2019-02-19 | Discharge: 2019-02-19 | Disposition: A | Discharge status: Home / Self Care | Payer: BC Managed Care – PPO | Source: Ambulatory Visit | Attending: Surgery | Admitting: Surgery

## 2019-02-19 VITALS — BP 126/73 | HR 101 | Temp 98.2°F | Resp 18 | Ht 71.0 in | Wt 207.0 lb

## 2019-02-19 DIAGNOSIS — N186 End stage renal disease: Secondary | ICD-10-CM

## 2019-02-20 DIAGNOSIS — Z992 Dependence on renal dialysis: Secondary | ICD-10-CM | POA: Diagnosis not present

## 2019-02-20 DIAGNOSIS — Z20822 Contact with and (suspected) exposure to covid-19: Secondary | ICD-10-CM | POA: Diagnosis not present

## 2019-02-20 DIAGNOSIS — N2581 Secondary hyperparathyroidism of renal origin: Secondary | ICD-10-CM | POA: Diagnosis not present

## 2019-02-20 DIAGNOSIS — N186 End stage renal disease: Secondary | ICD-10-CM | POA: Diagnosis not present

## 2019-02-23 DIAGNOSIS — N2581 Secondary hyperparathyroidism of renal origin: Secondary | ICD-10-CM | POA: Diagnosis not present

## 2019-02-23 DIAGNOSIS — N186 End stage renal disease: Secondary | ICD-10-CM | POA: Diagnosis not present

## 2019-02-23 DIAGNOSIS — Z992 Dependence on renal dialysis: Secondary | ICD-10-CM | POA: Diagnosis not present

## 2019-02-24 DIAGNOSIS — R7881 Bacteremia: Secondary | ICD-10-CM | POA: Diagnosis not present

## 2019-02-24 DIAGNOSIS — E1022 Type 1 diabetes mellitus with diabetic chronic kidney disease: Secondary | ICD-10-CM | POA: Diagnosis not present

## 2019-02-24 DIAGNOSIS — D696 Thrombocytopenia, unspecified: Secondary | ICD-10-CM | POA: Diagnosis not present

## 2019-02-24 DIAGNOSIS — I10 Essential (primary) hypertension: Secondary | ICD-10-CM | POA: Diagnosis not present

## 2019-02-24 DIAGNOSIS — E1065 Type 1 diabetes mellitus with hyperglycemia: Secondary | ICD-10-CM | POA: Diagnosis not present

## 2019-02-24 DIAGNOSIS — Z961 Presence of intraocular lens: Secondary | ICD-10-CM | POA: Diagnosis not present

## 2019-02-24 DIAGNOSIS — E119 Type 2 diabetes mellitus without complications: Secondary | ICD-10-CM | POA: Diagnosis not present

## 2019-02-24 DIAGNOSIS — E1122 Type 2 diabetes mellitus with diabetic chronic kidney disease: Secondary | ICD-10-CM | POA: Diagnosis not present

## 2019-02-24 DIAGNOSIS — B9689 Other specified bacterial agents as the cause of diseases classified elsewhere: Secondary | ICD-10-CM | POA: Diagnosis not present

## 2019-02-24 DIAGNOSIS — Z9841 Cataract extraction status, right eye: Secondary | ICD-10-CM | POA: Diagnosis not present

## 2019-02-24 DIAGNOSIS — R000 Tachycardia, unspecified: Secondary | ICD-10-CM | POA: Diagnosis not present

## 2019-02-24 DIAGNOSIS — K5900 Constipation, unspecified: Secondary | ICD-10-CM | POA: Diagnosis not present

## 2019-02-24 DIAGNOSIS — E1165 Type 2 diabetes mellitus with hyperglycemia: Secondary | ICD-10-CM | POA: Diagnosis not present

## 2019-02-24 DIAGNOSIS — Z20822 Contact with and (suspected) exposure to covid-19: Secondary | ICD-10-CM | POA: Diagnosis not present

## 2019-02-24 DIAGNOSIS — D631 Anemia in chronic kidney disease: Secondary | ICD-10-CM | POA: Diagnosis not present

## 2019-02-24 DIAGNOSIS — Z794 Long term (current) use of insulin: Secondary | ICD-10-CM | POA: Diagnosis not present

## 2019-02-24 DIAGNOSIS — Z9114 Patient's other noncompliance with medication regimen: Secondary | ICD-10-CM | POA: Diagnosis not present

## 2019-02-24 DIAGNOSIS — I517 Cardiomegaly: Secondary | ICD-10-CM | POA: Diagnosis not present

## 2019-02-24 DIAGNOSIS — R509 Fever, unspecified: Secondary | ICD-10-CM | POA: Diagnosis not present

## 2019-02-24 DIAGNOSIS — N186 End stage renal disease: Secondary | ICD-10-CM | POA: Diagnosis not present

## 2019-02-24 DIAGNOSIS — B954 Other streptococcus as the cause of diseases classified elsewhere: Secondary | ICD-10-CM | POA: Diagnosis not present

## 2019-02-24 DIAGNOSIS — R110 Nausea: Secondary | ICD-10-CM | POA: Diagnosis not present

## 2019-02-24 DIAGNOSIS — D72819 Decreased white blood cell count, unspecified: Secondary | ICD-10-CM | POA: Diagnosis not present

## 2019-02-24 DIAGNOSIS — R112 Nausea with vomiting, unspecified: Secondary | ICD-10-CM | POA: Diagnosis not present

## 2019-02-24 DIAGNOSIS — N185 Chronic kidney disease, stage 5: Secondary | ICD-10-CM | POA: Diagnosis not present

## 2019-02-24 DIAGNOSIS — A401 Sepsis due to streptococcus, group B: Secondary | ICD-10-CM | POA: Diagnosis not present

## 2019-02-24 DIAGNOSIS — K828 Other specified diseases of gallbladder: Secondary | ICD-10-CM | POA: Diagnosis not present

## 2019-02-24 DIAGNOSIS — R5383 Other fatigue: Secondary | ICD-10-CM | POA: Diagnosis not present

## 2019-02-24 DIAGNOSIS — Z4901 Encounter for fitting and adjustment of extracorporeal dialysis catheter: Secondary | ICD-10-CM | POA: Diagnosis not present

## 2019-02-24 DIAGNOSIS — I120 Hypertensive chronic kidney disease with stage 5 chronic kidney disease or end stage renal disease: Secondary | ICD-10-CM | POA: Diagnosis not present

## 2019-02-24 DIAGNOSIS — Z992 Dependence on renal dialysis: Secondary | ICD-10-CM | POA: Diagnosis not present

## 2019-02-24 DIAGNOSIS — R739 Hyperglycemia, unspecified: Secondary | ICD-10-CM | POA: Diagnosis not present

## 2019-02-24 DIAGNOSIS — N183 Chronic kidney disease, stage 3 unspecified: Secondary | ICD-10-CM | POA: Diagnosis not present

## 2019-02-24 DIAGNOSIS — T80211A Bloodstream infection due to central venous catheter, initial encounter: Secondary | ICD-10-CM | POA: Diagnosis not present

## 2019-02-24 DIAGNOSIS — T827XXA Infection and inflammatory reaction due to other cardiac and vascular devices, implants and grafts, initial encounter: Secondary | ICD-10-CM | POA: Diagnosis not present

## 2019-02-25 DIAGNOSIS — I120 Hypertensive chronic kidney disease with stage 5 chronic kidney disease or end stage renal disease: Secondary | ICD-10-CM | POA: Diagnosis not present

## 2019-02-25 DIAGNOSIS — I517 Cardiomegaly: Secondary | ICD-10-CM | POA: Diagnosis not present

## 2019-02-25 DIAGNOSIS — R509 Fever, unspecified: Secondary | ICD-10-CM | POA: Diagnosis not present

## 2019-02-25 DIAGNOSIS — I10 Essential (primary) hypertension: Secondary | ICD-10-CM | POA: Diagnosis not present

## 2019-02-25 DIAGNOSIS — D631 Anemia in chronic kidney disease: Secondary | ICD-10-CM | POA: Diagnosis not present

## 2019-02-25 DIAGNOSIS — B954 Other streptococcus as the cause of diseases classified elsewhere: Secondary | ICD-10-CM | POA: Diagnosis not present

## 2019-02-25 DIAGNOSIS — E1065 Type 1 diabetes mellitus with hyperglycemia: Secondary | ICD-10-CM | POA: Diagnosis not present

## 2019-02-25 DIAGNOSIS — Z992 Dependence on renal dialysis: Secondary | ICD-10-CM | POA: Diagnosis not present

## 2019-02-25 DIAGNOSIS — B9689 Other specified bacterial agents as the cause of diseases classified elsewhere: Secondary | ICD-10-CM | POA: Diagnosis not present

## 2019-02-25 DIAGNOSIS — N186 End stage renal disease: Secondary | ICD-10-CM | POA: Diagnosis not present

## 2019-02-25 DIAGNOSIS — E1122 Type 2 diabetes mellitus with diabetic chronic kidney disease: Secondary | ICD-10-CM | POA: Diagnosis not present

## 2019-02-25 DIAGNOSIS — E119 Type 2 diabetes mellitus without complications: Secondary | ICD-10-CM | POA: Diagnosis not present

## 2019-02-25 DIAGNOSIS — R7881 Bacteremia: Secondary | ICD-10-CM | POA: Diagnosis not present

## 2019-02-25 DIAGNOSIS — D72819 Decreased white blood cell count, unspecified: Secondary | ICD-10-CM | POA: Diagnosis not present

## 2019-02-25 DIAGNOSIS — E1022 Type 1 diabetes mellitus with diabetic chronic kidney disease: Secondary | ICD-10-CM | POA: Diagnosis not present

## 2019-02-25 DIAGNOSIS — N183 Chronic kidney disease, stage 3 unspecified: Secondary | ICD-10-CM | POA: Diagnosis not present

## 2019-02-26 ENCOUNTER — Telehealth: Payer: Self-pay | Admitting: *Deleted

## 2019-02-26 MED ORDER — CALCIUM ACETATE (PHOS BINDER) 667 MG PO CAPS
1334.00 | ORAL_CAPSULE | ORAL | Status: DC
Start: 2019-03-03 — End: 2019-02-26

## 2019-02-26 MED ORDER — TEMAZEPAM 15 MG PO CAPS
15.00 | ORAL_CAPSULE | ORAL | Status: DC
Start: ? — End: 2019-02-26

## 2019-02-26 MED ORDER — GLUCOSE 40 % PO GEL
15.00 | ORAL | Status: DC
Start: ? — End: 2019-02-26

## 2019-02-26 MED ORDER — INSULIN LISPRO 100 UNIT/ML ~~LOC~~ SOLN
1.00 | SUBCUTANEOUS | Status: DC
Start: 2019-02-26 — End: 2019-02-26

## 2019-02-26 MED ORDER — DEXTROSE 10 % IV SOLN
125.00 | INTRAVENOUS | Status: DC
Start: ? — End: 2019-02-26

## 2019-02-26 MED ORDER — GLUCAGON (RDNA) 1 MG IJ KIT
1.00 | PACK | INTRAMUSCULAR | Status: DC
Start: ? — End: 2019-02-26

## 2019-02-26 MED ORDER — SORBITOL 70 % PO SOLN
30.00 | ORAL | Status: DC
Start: ? — End: 2019-02-26

## 2019-02-26 MED ORDER — PROMETHAZINE HCL 25 MG PO TABS
25.00 | ORAL_TABLET | ORAL | Status: DC
Start: ? — End: 2019-02-26

## 2019-02-26 MED ORDER — ONDANSETRON HCL 4 MG/2ML IJ SOLN
4.00 | INTRAMUSCULAR | Status: DC
Start: ? — End: 2019-02-26

## 2019-02-26 MED ORDER — BISACODYL 5 MG PO TBEC
10.00 | DELAYED_RELEASE_TABLET | ORAL | Status: DC
Start: ? — End: 2019-02-26

## 2019-02-26 MED ORDER — GENERIC EXTERNAL MEDICATION
1.00 | Status: DC
Start: ? — End: 2019-02-26

## 2019-02-26 MED ORDER — CINACALCET HCL 30 MG PO TABS
30.00 | ORAL_TABLET | ORAL | Status: DC
Start: 2019-03-04 — End: 2019-02-26

## 2019-02-26 MED ORDER — ACETAMINOPHEN 325 MG PO TABS
650.00 | ORAL_TABLET | ORAL | Status: DC
Start: ? — End: 2019-02-26

## 2019-02-26 MED ORDER — SODIUM CHLORIDE FLUSH 0.9 % IV SOLN
5.00 | INTRAVENOUS | Status: DC
Start: 2019-03-02 — End: 2019-02-26

## 2019-02-26 MED ORDER — DOXERCALCIFEROL 2.5 MCG PO CAPS
5.00 | ORAL_CAPSULE | ORAL | Status: DC
Start: 2019-03-04 — End: 2019-02-26

## 2019-02-26 MED ORDER — PANTOPRAZOLE SODIUM 40 MG PO TBEC
40.00 | DELAYED_RELEASE_TABLET | ORAL | Status: DC
Start: 2019-03-03 — End: 2019-02-26

## 2019-02-26 MED ORDER — ALUM & MAG HYDROXIDE-SIMETH 200-200-20 MG/5ML PO SUSP
30.00 | ORAL | Status: DC
Start: ? — End: 2019-02-26

## 2019-02-26 MED ORDER — SODIUM CHLORIDE FLUSH 0.9 % IV SOLN
5.00 | INTRAVENOUS | Status: DC
Start: ? — End: 2019-02-26

## 2019-02-26 MED ORDER — HEPARIN SODIUM (PORCINE) 5000 UNIT/ML IJ SOLN
5000.00 | INTRAMUSCULAR | Status: DC
Start: 2019-02-26 — End: 2019-02-26

## 2019-02-26 NOTE — Telephone Encounter (Signed)
Received call from Nolan at [facility] she states patient has bacteremia and perm cath has been pulled they were requesting to use AVF placed 01/13/19. Spoke with [place]ley PA he reviewed patient's chart and said it is too soon to use that access. Called Clyde Lundborg back and let her know it can"t be used yet she verbalized understanding.

## 2019-02-27 DIAGNOSIS — E119 Type 2 diabetes mellitus without complications: Secondary | ICD-10-CM | POA: Diagnosis not present

## 2019-02-27 DIAGNOSIS — E1122 Type 2 diabetes mellitus with diabetic chronic kidney disease: Secondary | ICD-10-CM | POA: Diagnosis not present

## 2019-02-27 DIAGNOSIS — I10 Essential (primary) hypertension: Secondary | ICD-10-CM | POA: Diagnosis not present

## 2019-02-27 DIAGNOSIS — Z992 Dependence on renal dialysis: Secondary | ICD-10-CM | POA: Diagnosis not present

## 2019-02-27 DIAGNOSIS — E1022 Type 1 diabetes mellitus with diabetic chronic kidney disease: Secondary | ICD-10-CM | POA: Diagnosis not present

## 2019-02-27 DIAGNOSIS — T80211A Bloodstream infection due to central venous catheter, initial encounter: Secondary | ICD-10-CM | POA: Diagnosis not present

## 2019-02-27 DIAGNOSIS — I120 Hypertensive chronic kidney disease with stage 5 chronic kidney disease or end stage renal disease: Secondary | ICD-10-CM | POA: Diagnosis not present

## 2019-02-27 DIAGNOSIS — N183 Chronic kidney disease, stage 3 unspecified: Secondary | ICD-10-CM | POA: Diagnosis not present

## 2019-02-27 DIAGNOSIS — R7881 Bacteremia: Secondary | ICD-10-CM | POA: Diagnosis not present

## 2019-02-27 DIAGNOSIS — E1065 Type 1 diabetes mellitus with hyperglycemia: Secondary | ICD-10-CM | POA: Diagnosis not present

## 2019-02-27 DIAGNOSIS — N186 End stage renal disease: Secondary | ICD-10-CM | POA: Diagnosis not present

## 2019-02-28 DIAGNOSIS — A401 Sepsis due to streptococcus, group B: Secondary | ICD-10-CM | POA: Diagnosis not present

## 2019-02-28 DIAGNOSIS — Z992 Dependence on renal dialysis: Secondary | ICD-10-CM | POA: Diagnosis not present

## 2019-02-28 DIAGNOSIS — B954 Other streptococcus as the cause of diseases classified elsewhere: Secondary | ICD-10-CM | POA: Diagnosis not present

## 2019-02-28 DIAGNOSIS — I120 Hypertensive chronic kidney disease with stage 5 chronic kidney disease or end stage renal disease: Secondary | ICD-10-CM | POA: Diagnosis not present

## 2019-02-28 DIAGNOSIS — N186 End stage renal disease: Secondary | ICD-10-CM | POA: Diagnosis not present

## 2019-02-28 DIAGNOSIS — I10 Essential (primary) hypertension: Secondary | ICD-10-CM | POA: Diagnosis not present

## 2019-02-28 DIAGNOSIS — E1065 Type 1 diabetes mellitus with hyperglycemia: Secondary | ICD-10-CM | POA: Diagnosis not present

## 2019-02-28 DIAGNOSIS — R7881 Bacteremia: Secondary | ICD-10-CM | POA: Diagnosis not present

## 2019-03-02 DIAGNOSIS — R7881 Bacteremia: Secondary | ICD-10-CM | POA: Diagnosis not present

## 2019-03-02 DIAGNOSIS — I120 Hypertensive chronic kidney disease with stage 5 chronic kidney disease or end stage renal disease: Secondary | ICD-10-CM | POA: Diagnosis not present

## 2019-03-02 DIAGNOSIS — D631 Anemia in chronic kidney disease: Secondary | ICD-10-CM | POA: Diagnosis not present

## 2019-03-02 DIAGNOSIS — I10 Essential (primary) hypertension: Secondary | ICD-10-CM | POA: Diagnosis not present

## 2019-03-02 DIAGNOSIS — Z992 Dependence on renal dialysis: Secondary | ICD-10-CM | POA: Diagnosis not present

## 2019-03-02 DIAGNOSIS — Z4901 Encounter for fitting and adjustment of extracorporeal dialysis catheter: Secondary | ICD-10-CM | POA: Diagnosis not present

## 2019-03-02 DIAGNOSIS — E1022 Type 1 diabetes mellitus with diabetic chronic kidney disease: Secondary | ICD-10-CM | POA: Diagnosis not present

## 2019-03-02 DIAGNOSIS — B954 Other streptococcus as the cause of diseases classified elsewhere: Secondary | ICD-10-CM | POA: Diagnosis not present

## 2019-03-02 DIAGNOSIS — N186 End stage renal disease: Secondary | ICD-10-CM | POA: Diagnosis not present

## 2019-03-02 DIAGNOSIS — N185 Chronic kidney disease, stage 5: Secondary | ICD-10-CM | POA: Diagnosis not present

## 2019-03-03 MED ORDER — HYDRALAZINE HCL 20 MG/ML IJ SOLN
10.00 | INTRAMUSCULAR | Status: DC
Start: ? — End: 2019-03-03

## 2019-03-03 MED ORDER — GLUCOSE 40 % PO GEL
15.00 | ORAL | Status: DC
Start: ? — End: 2019-03-03

## 2019-03-03 MED ORDER — AMLODIPINE BESYLATE 5 MG PO TABS
10.00 | ORAL_TABLET | ORAL | Status: DC
Start: 2019-03-03 — End: 2019-03-03

## 2019-03-03 MED ORDER — DEXTROSE 10 % IV SOLN
125.00 | INTRAVENOUS | Status: DC
Start: ? — End: 2019-03-03

## 2019-03-03 MED ORDER — INSULIN LISPRO 100 UNIT/ML ~~LOC~~ SOLN
2.00 | SUBCUTANEOUS | Status: DC
Start: 2019-03-03 — End: 2019-03-03

## 2019-03-03 MED ORDER — INSULIN GLARGINE 100 UNIT/ML ~~LOC~~ SOLN
8.00 | SUBCUTANEOUS | Status: DC
Start: 2019-03-02 — End: 2019-03-03

## 2019-03-03 MED ORDER — GLUCAGON (RDNA) 1 MG IJ KIT
1.00 | PACK | INTRAMUSCULAR | Status: DC
Start: ? — End: 2019-03-03

## 2019-03-04 DIAGNOSIS — Z992 Dependence on renal dialysis: Secondary | ICD-10-CM | POA: Diagnosis not present

## 2019-03-04 DIAGNOSIS — N2581 Secondary hyperparathyroidism of renal origin: Secondary | ICD-10-CM | POA: Diagnosis not present

## 2019-03-04 DIAGNOSIS — A499 Bacterial infection, unspecified: Secondary | ICD-10-CM | POA: Diagnosis not present

## 2019-03-04 DIAGNOSIS — N186 End stage renal disease: Secondary | ICD-10-CM | POA: Diagnosis not present

## 2019-03-04 DIAGNOSIS — Z23 Encounter for immunization: Secondary | ICD-10-CM | POA: Diagnosis not present

## 2019-03-06 ENCOUNTER — Emergency Department (HOSPITAL_COMMUNITY): Payer: BC Managed Care – PPO

## 2019-03-06 ENCOUNTER — Inpatient Hospital Stay (HOSPITAL_COMMUNITY)
Admission: EM | Admit: 2019-03-06 | Discharge: 2019-03-13 | DRG: 492 | Disposition: A | Discharge status: Home Health | Payer: BC Managed Care – PPO | Attending: Student | Admitting: Student

## 2019-03-06 ENCOUNTER — Encounter (HOSPITAL_COMMUNITY): Payer: Self-pay

## 2019-03-06 ENCOUNTER — Other Ambulatory Visit: Payer: Self-pay

## 2019-03-06 DIAGNOSIS — R52 Pain, unspecified: Secondary | ICD-10-CM | POA: Diagnosis not present

## 2019-03-06 DIAGNOSIS — M25561 Pain in right knee: Secondary | ICD-10-CM | POA: Diagnosis not present

## 2019-03-06 DIAGNOSIS — W19XXXA Unspecified fall, initial encounter: Secondary | ICD-10-CM

## 2019-03-06 DIAGNOSIS — E1022 Type 1 diabetes mellitus with diabetic chronic kidney disease: Secondary | ICD-10-CM | POA: Diagnosis present

## 2019-03-06 DIAGNOSIS — M2500 Hemarthrosis, unspecified joint: Secondary | ICD-10-CM | POA: Diagnosis present

## 2019-03-06 DIAGNOSIS — S82831A Other fracture of upper and lower end of right fibula, initial encounter for closed fracture: Secondary | ICD-10-CM

## 2019-03-06 DIAGNOSIS — Z5329 Procedure and treatment not carried out because of patient's decision for other reasons: Secondary | ICD-10-CM | POA: Diagnosis not present

## 2019-03-06 DIAGNOSIS — W1830XA Fall on same level, unspecified, initial encounter: Secondary | ICD-10-CM | POA: Diagnosis present

## 2019-03-06 DIAGNOSIS — Z794 Long term (current) use of insulin: Secondary | ICD-10-CM

## 2019-03-06 DIAGNOSIS — N2581 Secondary hyperparathyroidism of renal origin: Secondary | ICD-10-CM | POA: Diagnosis present

## 2019-03-06 DIAGNOSIS — Y92009 Unspecified place in unspecified non-institutional (private) residence as the place of occurrence of the external cause: Secondary | ICD-10-CM | POA: Diagnosis not present

## 2019-03-06 DIAGNOSIS — Z79899 Other long term (current) drug therapy: Secondary | ICD-10-CM

## 2019-03-06 DIAGNOSIS — S82121A Displaced fracture of lateral condyle of right tibia, initial encounter for closed fracture: Secondary | ICD-10-CM | POA: Diagnosis not present

## 2019-03-06 DIAGNOSIS — Z833 Family history of diabetes mellitus: Secondary | ICD-10-CM

## 2019-03-06 DIAGNOSIS — Z20822 Contact with and (suspected) exposure to covid-19: Secondary | ICD-10-CM | POA: Diagnosis present

## 2019-03-06 DIAGNOSIS — I10 Essential (primary) hypertension: Secondary | ICD-10-CM

## 2019-03-06 DIAGNOSIS — S82141A Displaced bicondylar fracture of right tibia, initial encounter for closed fracture: Principal | ICD-10-CM

## 2019-03-06 DIAGNOSIS — D631 Anemia in chronic kidney disease: Secondary | ICD-10-CM | POA: Diagnosis not present

## 2019-03-06 DIAGNOSIS — D638 Anemia in other chronic diseases classified elsewhere: Secondary | ICD-10-CM | POA: Diagnosis not present

## 2019-03-06 DIAGNOSIS — N189 Chronic kidney disease, unspecified: Secondary | ICD-10-CM | POA: Diagnosis not present

## 2019-03-06 DIAGNOSIS — I120 Hypertensive chronic kidney disease with stage 5 chronic kidney disease or end stage renal disease: Secondary | ICD-10-CM | POA: Diagnosis not present

## 2019-03-06 DIAGNOSIS — S82401A Unspecified fracture of shaft of right fibula, initial encounter for closed fracture: Secondary | ICD-10-CM | POA: Diagnosis not present

## 2019-03-06 DIAGNOSIS — D649 Anemia, unspecified: Secondary | ICD-10-CM | POA: Diagnosis present

## 2019-03-06 DIAGNOSIS — E1010 Type 1 diabetes mellitus with ketoacidosis without coma: Secondary | ICD-10-CM | POA: Diagnosis not present

## 2019-03-06 DIAGNOSIS — N186 End stage renal disease: Secondary | ICD-10-CM

## 2019-03-06 DIAGNOSIS — S8261XA Displaced fracture of lateral malleolus of right fibula, initial encounter for closed fracture: Secondary | ICD-10-CM | POA: Diagnosis not present

## 2019-03-06 DIAGNOSIS — S82143A Displaced bicondylar fracture of unspecified tibia, initial encounter for closed fracture: Secondary | ICD-10-CM | POA: Diagnosis not present

## 2019-03-06 DIAGNOSIS — N179 Acute kidney failure, unspecified: Secondary | ICD-10-CM | POA: Diagnosis not present

## 2019-03-06 DIAGNOSIS — E109 Type 1 diabetes mellitus without complications: Secondary | ICD-10-CM | POA: Diagnosis not present

## 2019-03-06 DIAGNOSIS — M7989 Other specified soft tissue disorders: Secondary | ICD-10-CM | POA: Diagnosis not present

## 2019-03-06 DIAGNOSIS — Y990 Civilian activity done for income or pay: Secondary | ICD-10-CM | POA: Diagnosis not present

## 2019-03-06 DIAGNOSIS — Z23 Encounter for immunization: Secondary | ICD-10-CM | POA: Diagnosis not present

## 2019-03-06 DIAGNOSIS — R609 Edema, unspecified: Secondary | ICD-10-CM | POA: Diagnosis not present

## 2019-03-06 DIAGNOSIS — E1065 Type 1 diabetes mellitus with hyperglycemia: Secondary | ICD-10-CM | POA: Diagnosis not present

## 2019-03-06 DIAGNOSIS — Z992 Dependence on renal dialysis: Secondary | ICD-10-CM

## 2019-03-06 DIAGNOSIS — A499 Bacterial infection, unspecified: Secondary | ICD-10-CM | POA: Diagnosis not present

## 2019-03-06 DIAGNOSIS — E1051 Type 1 diabetes mellitus with diabetic peripheral angiopathy without gangrene: Secondary | ICD-10-CM | POA: Diagnosis present

## 2019-03-06 DIAGNOSIS — Z8249 Family history of ischemic heart disease and other diseases of the circulatory system: Secondary | ICD-10-CM

## 2019-03-06 DIAGNOSIS — S82201A Unspecified fracture of shaft of right tibia, initial encounter for closed fracture: Secondary | ICD-10-CM | POA: Diagnosis not present

## 2019-03-06 DIAGNOSIS — M25571 Pain in right ankle and joints of right foot: Secondary | ICD-10-CM | POA: Diagnosis present

## 2019-03-06 DIAGNOSIS — Z419 Encounter for procedure for purposes other than remedying health state, unspecified: Secondary | ICD-10-CM

## 2019-03-06 LAB — VITAMIN B12: Vitamin B-12: 828 pg/mL (ref 180–914)

## 2019-03-06 LAB — CBC WITH DIFFERENTIAL/PLATELET
Abs Immature Granulocytes: 0.03 10*3/uL (ref 0.00–0.07)
Basophils Absolute: 0 10*3/uL (ref 0.0–0.1)
Basophils Relative: 0 %
Eosinophils Absolute: 0 10*3/uL (ref 0.0–0.5)
Eosinophils Relative: 1 %
HCT: 28.7 % — ABNORMAL LOW (ref 39.0–52.0)
Hemoglobin: 8.9 g/dL — ABNORMAL LOW (ref 13.0–17.0)
Immature Granulocytes: 0 %
Lymphocytes Relative: 10 %
Lymphs Abs: 0.9 10*3/uL (ref 0.7–4.0)
MCH: 27.9 pg (ref 26.0–34.0)
MCHC: 31 g/dL (ref 30.0–36.0)
MCV: 90 fL (ref 80.0–100.0)
Monocytes Absolute: 0.5 10*3/uL (ref 0.1–1.0)
Monocytes Relative: 6 %
Neutro Abs: 7.2 10*3/uL (ref 1.7–7.7)
Neutrophils Relative %: 83 %
Platelets: 318 10*3/uL (ref 150–400)
RBC: 3.19 MIL/uL — ABNORMAL LOW (ref 4.22–5.81)
RDW: 14.9 % (ref 11.5–15.5)
WBC: 8.7 10*3/uL (ref 4.0–10.5)
nRBC: 0 % (ref 0.0–0.2)

## 2019-03-06 LAB — PROTIME-INR
INR: 1 (ref 0.8–1.2)
Prothrombin Time: 13.5 seconds (ref 11.4–15.2)

## 2019-03-06 LAB — BASIC METABOLIC PANEL
Anion gap: 12 (ref 5–15)
BUN: 17 mg/dL (ref 6–20)
CO2: 29 mmol/L (ref 22–32)
Calcium: 8.2 mg/dL — ABNORMAL LOW (ref 8.9–10.3)
Chloride: 95 mmol/L — ABNORMAL LOW (ref 98–111)
Creatinine, Ser: 4.88 mg/dL — ABNORMAL HIGH (ref 0.61–1.24)
GFR calc Af Amer: 15 mL/min — ABNORMAL LOW (ref >60)
GFR calc non Af Amer: 13 mL/min — ABNORMAL LOW (ref >60)
Glucose, Bld: 218 mg/dL — ABNORMAL HIGH (ref 70–99)
Potassium: 3.6 mmol/L (ref 3.5–5.1)
Sodium: 136 mmol/L (ref 135–145)

## 2019-03-06 LAB — SURGICAL PCR SCREEN
MRSA, PCR: NEGATIVE
Staphylococcus aureus: NEGATIVE

## 2019-03-06 LAB — HEMOGLOBIN A1C
Hgb A1c MFr Bld: 9.2 % — ABNORMAL HIGH (ref 4.8–5.6)
Mean Plasma Glucose: 217.34 mg/dL

## 2019-03-06 LAB — APTT: aPTT: 29 seconds (ref 24–36)

## 2019-03-06 LAB — IRON AND TIBC
Iron: 20 ug/dL — ABNORMAL LOW (ref 45–182)
Saturation Ratios: 11 % — ABNORMAL LOW (ref 17.9–39.5)
TIBC: 190 ug/dL — ABNORMAL LOW (ref 250–450)
UIBC: 170 ug/dL

## 2019-03-06 LAB — FERRITIN: Ferritin: 547 ng/mL — ABNORMAL HIGH (ref 24–336)

## 2019-03-06 LAB — SARS CORONAVIRUS 2 (TAT 6-24 HRS): SARS Coronavirus 2: NEGATIVE

## 2019-03-06 LAB — FOLATE: Folate: 8.6 ng/mL (ref >5.9)

## 2019-03-06 LAB — RETICULOCYTES
Immature Retic Fract: 13.1 % (ref 2.3–15.9)
RBC.: 2.98 MIL/uL — ABNORMAL LOW (ref 4.22–5.81)
Retic Count, Absolute: 56 10*3/uL (ref 19.0–186.0)
Retic Ct Pct: 1.9 % (ref 0.4–3.1)

## 2019-03-06 LAB — GLUCOSE, CAPILLARY: Glucose-Capillary: 219 mg/dL — ABNORMAL HIGH (ref 70–99)

## 2019-03-06 MED ORDER — INSULIN ASPART 100 UNIT/ML ~~LOC~~ SOLN: 0.0000 [IU] | Freq: Three times a day (TID) | SUBCUTANEOUS | Status: DC

## 2019-03-06 MED ORDER — ONDANSETRON HCL 4 MG/2ML IJ SOLN: 4.0000 mg | Freq: Four times a day (QID) | INTRAMUSCULAR | Status: DC | PRN

## 2019-03-06 MED ORDER — INSULIN ASPART 100 UNIT/ML ~~LOC~~ SOLN: 2.0000 [IU] | Freq: Three times a day (TID) | SUBCUTANEOUS | Status: DC

## 2019-03-06 MED ORDER — ONDANSETRON HCL 4 MG PO TABS: 4.0000 mg | ORAL_TABLET | Freq: Four times a day (QID) | ORAL | Status: DC | PRN

## 2019-03-06 MED ORDER — ACETAMINOPHEN 500 MG PO TABS
1000.0000 mg | ORAL_TABLET | Freq: Three times a day (TID) | ORAL | Status: DC | PRN
  Administered 2019-03-07: 1000 mg via ORAL
  Filled 2019-03-06: qty 2

## 2019-03-06 MED ORDER — BISACODYL 10 MG RE SUPP: 10.0000 mg | Freq: Every day | RECTAL | Status: DC | PRN

## 2019-03-06 MED ORDER — AMLODIPINE BESYLATE 10 MG PO TABS
10.0000 mg | ORAL_TABLET | Freq: Every day | ORAL | Status: DC
  Administered 2019-03-08 – 2019-03-13 (×4): 10 mg via ORAL
  Filled 2019-03-06 (×5): qty 1

## 2019-03-06 MED ORDER — SEVELAMER CARBONATE 800 MG PO TABS
2400.0000 mg | ORAL_TABLET | Freq: Two times a day (BID) | ORAL | Status: DC
  Administered 2019-03-08 – 2019-03-10 (×5): 2400 mg via ORAL
  Filled 2019-03-06 (×5): qty 3

## 2019-03-06 MED ORDER — INSULIN GLARGINE 100 UNIT/ML ~~LOC~~ SOLN
5.0000 [IU] | Freq: Every day | SUBCUTANEOUS | Status: DC
  Administered 2019-03-06: 5 [IU] via SUBCUTANEOUS
  Filled 2019-03-06 (×3): qty 0.05

## 2019-03-06 MED ORDER — CHLORHEXIDINE GLUCONATE 4 % EX LIQD
60.0000 mL | Freq: Once | CUTANEOUS | Status: AC
  Administered 2019-03-07: 4 via TOPICAL
  Filled 2019-03-06: qty 60

## 2019-03-06 MED ORDER — ENSURE PRE-SURGERY PO LIQD
296.0000 mL | Freq: Once | ORAL | Status: AC
  Administered 2019-03-06: 296 mL via ORAL
  Filled 2019-03-06: qty 296

## 2019-03-06 MED ORDER — DOCUSATE SODIUM 100 MG PO CAPS: 100.0000 mg | ORAL_CAPSULE | Freq: Two times a day (BID) | ORAL | Status: DC

## 2019-03-06 MED ORDER — SENNOSIDES-DOCUSATE SODIUM 8.6-50 MG PO TABS: 1.0000 | ORAL_TABLET | Freq: Every evening | ORAL | Status: DC | PRN

## 2019-03-06 MED ORDER — ACETAMINOPHEN 500 MG PO TABS
1000.0000 mg | ORAL_TABLET | Freq: Once | ORAL | Status: AC
  Administered 2019-03-07: 06:00:00 1000 mg via ORAL
  Filled 2019-03-06: qty 2

## 2019-03-06 MED ORDER — FENTANYL CITRATE (PF) 100 MCG/2ML IJ SOLN
25.0000 ug | INTRAMUSCULAR | Status: DC | PRN
  Administered 2019-03-06 – 2019-03-07 (×6): 50 ug via INTRAVENOUS
  Filled 2019-03-06 (×6): qty 2

## 2019-03-06 MED ORDER — ALBUTEROL SULFATE (2.5 MG/3ML) 0.083% IN NEBU: 2.5000 mg | INHALATION_SOLUTION | RESPIRATORY_TRACT | Status: DC | PRN

## 2019-03-06 MED ORDER — POVIDONE-IODINE 10 % EX SWAB: 2.0000 "application " | Freq: Once | CUTANEOUS | Status: DC

## 2019-03-06 MED ORDER — CEFAZOLIN SODIUM-DEXTROSE 2-4 GM/100ML-% IV SOLN
2.0000 g | INTRAVENOUS | Status: DC
  Filled 2019-03-06: qty 100

## 2019-03-06 NOTE — Progress Notes (Signed)
Orthopedic Tech Progress Note Patient Details:  Alex Morrison [name] 04-10-1970 [name] Patient does have a lot of swelling going on at this time  Ortho Devices Type of Ortho Device: Post (short) splint, Stirrup splint, Knee Immobilizer Splint Material: Plaster Ortho Device/Splint Location: RLE Ortho Device/Splint Interventions: Ordered, Application   Post Interventions Patient Tolerated: Well Instructions Provided: Care of device, Adjustment of device   [name] 03/06/2019, 6:03 PM

## 2019-03-06 NOTE — ED Notes (Signed)
Report called to christina rn on 5n

## 2019-03-06 NOTE — Consult Note (Addendum)
ORTHOPAEDIC CONSULTATION  REQUESTING PHYSICIAN: Quintella Reichert, MD  Chief Complaint: Knee Pain  HPI: Alex Morrison is a 49 y.o. male presented to ED with past medical history of diabetes, ESRD on dialysis, hypertension, and recent hospitalization at [facility] 2/9-2/15 with Strep. mitis bacteremia on Ancef. Patient believes he has finished all of his discharge antibiotics from that hospitalization. He complains today of right knee pain after a fall. Patient fell off of 4 foot loading dock earlier today and landed on his feet, but after he landed he felt a sudden pain in his right knee. Pain is constant and achy in quality and is worse with movement, better with rest. He complains of right ankle pain as well though he notes this is mild and mainly located at the lateral ankle. Denies head trauma or other injuries. Denies focal numbness, chest pain, shortness of breath, nausea, vomiting.  Past Medical History:  Diagnosis Date  . Abscess of buttock   . Acute kidney injury ([place]) 09/27/2013  . Diabetes mellitus without complication ([place])   . ESRD (end stage renal disease) ([place])   . HTN (hypertension) 08/08/2018  . Perforated appendix    Peritonitis   Past Surgical History:  Procedure Laterality Date  . AV FISTULA PLACEMENT Left 01/13/2019   Procedure: LEFT ARM ARTERIOVENOUS (AV) FISTULA CREATION;  Surgeon: Elam Dutch, MD;  Location: [place];  Service: Vascular;  Laterality: Left;  . Ex lap with ileocecectomy  09/27/13   Dr. Brantley Stage  . INCISION AND DRAINAGE    . INCISIONAL HERNIA REPAIR N/A 08/11/2018   Procedure: OPEN RETRORECTUS REPAIR OF INCARCERATED INCISIONAL HERNIA WITH BILATERAL POSTERIOR COMPONENT SEPARATION;  Surgeon: [name], MD;  Location: [place];  Service: General;  Laterality: N/A;  . INSERTION OF MESH  08/11/2018   Procedure: INSERTION OF PHASIX MESH;  Surgeon: [name], MD;  Location: [place];  Service: General;;  . insertion of PD catheter  12/2017  . IR  FLUORO GUIDE CV LINE RIGHT  08/08/2018  . IR US GUIDE VASC ACCESS RIGHT  08/08/2018  . LAPAROSCOPIC ABDOMINAL EXPLORATION N/A 09/27/2013  . LAPAROSCOPIC ASSISTED VENTRAL HERNIA REPAIR  2019  . LAPAROSCOPY N/A 09/27/2013   Procedure: LAPAROSCOPY DIAGNOSTIC;  Surgeon: Erroll Luna, MD;  Location: [place];  Service: General;  Laterality: N/A;  . LAPAROTOMY  08/11/2018   Procedure: EXPLORATORY LAPAROTOMY;  Surgeon: [name], MD;  Location: [place];  Service: General;;  . LYSIS OF ADHESION  08/11/2018   Procedure: LYSIS OF ADHESIONS X 30 MINUTES;  Surgeon: [name], MD;  Location: [place];  Service: General;;  . MINOR REMOVAL OF PERITONEAL DIALYSIS CATHETER N/A 01/06/2019   Procedure: REMOVAL OF PERITONEAL DIALYSIS CATHETER;  Surgeon: Kieth Brightly Arta Bruce, MD;  Location: [facility];  Service: General;  Laterality: N/A;  . [place] History   Socioeconomic History  . Marital status: Single    Spouse name: Not on file  . Number of children: Not on file  . Years of education: Not on file  . Highest education level: Not on file  Occupational History  . Not on file  Tobacco Use  . Smoking status: Never Smoker  . Smokeless tobacco: Never Used  Substance and Sexual Activity  . Alcohol use: No  . Drug use: No  . Sexual activity: Not on file  Other Topics Concern  . Not on file  Social History Narrative  . Not on file   Social Determinants of Health  Financial Resource Strain:   . Difficulty of Paying Living Expenses: Not on file  Food Insecurity:   . Worried About Charity fundraiser in the Last Year: Not on file  . Ran Out of Food in the Last Year: Not on file  Transportation Needs:   . Lack of Transportation (Medical): Not on file  . Lack of Transportation (Non-Medical): Not on file  Physical Activity:   . Days of Exercise per Week: Not on file  . Minutes of Exercise per Session: Not on file  Stress:   . Feeling of Stress : Not on file  Social Connections:    . Frequency of Communication with Friends and Family: Not on file  . Frequency of Social Gatherings with Friends and Family: Not on file  . Attends Religious Services: Not on file  . Active Member of Clubs or Organizations: Not on file  . Attends Archivist Meetings: Not on file  . Marital Status: Not on file   Family History  Problem Relation Age of Onset  . Diabetes Mellitus II Mother   . Diabetes Mother   . Heart disease Mother   . Diabetes Mellitus I Father   . Diabetes Father   . Diabetes Mellitus II Brother   . Diabetes Brother    No Known Allergies   Positive ROS: All other systems have been reviewed and were otherwise negative with the exception of those mentioned in the HPI and as above.  Physical Exam: General: Alert, no acute distress. Sitting up comfortably. Cardiovascular: Normal rate and rhythm.  Respiratory: No cyanosis, no use of accessory musculature GI: No organomegaly, abdomen is soft and non-tender Skin: No lesions in the area of chief complaint, scrapes over patella of right knee.  Neurologic: Sensation intact distally. Psychiatric: Patient is competent for consent with normal mood and affect Lymphatic: No axillary or cervical lymphadenopathy  MUSCULOSKELETAL:  1+ edema of right knee. Mild TTP to proximal tibia and lateral joint line. No TTP to medial joint line or posterior knee. Compartments compressible. 1+ edema of right lateral malleolus with TTP. 10 degrees of dorsiflexion and plantarflexion of right ankle without pain. Sensation intact distally. Able to move all toes of right foot without pain. No pain with passive ROM of any toes of right foot. 2+ DP pulse. Warm, well perfused foot.   Imaging: X-ray Right Knee - comminuted, displaced and depressed fracture of the lateral tibial plateau associated with hemarthrosis. No dislocation. CT Right Knee: Comminuted and depressed lateral tibial plateau fracture most consistent with a Schatzker type  2 injury. The articular surface is divided into 3 main fragments. Depression is up to 1.1 cm. There is a floating fragment on the periphery of the articular surface which measures up to 1.5 cm transverse at the articular surface by 3.6 cm craniocaudal by 5.4 cm AP. This fragment is laterally displaced approximately 1 cm. Fracture includes a nondisplaced component which extends through the medial tibial eminence and into the medial plateau.The cruciate and collateral ligaments appear intact. The lateral meniscus is avulsed from the tibia. Large lipo atherosclerosis also noted. Hemarthrosis. X-ray Right Ankle - Slightly displace fracture of the distal right fibula noted. Prominent soft tissue swelling of lateral malleolus. Peripheral vascular disease.    Assessment:  Closed right tibial plateau fracture and closed right distal fibula fracture  Plan: - knee immobilizer and posterior ankle splint, NWB RLE  - Patient admitted this evening, ORIF tibia vs Ex Fix tomorrow morning with Dr. Mardelle Matte -  NPO after midnight - PRN pain control - Q3 hour neurovascular checks to monitor for compartment syndrome  Ventura Bruns, PA-C  03/06/2019 5:16 PM

## 2019-03-06 NOTE — ED Provider Notes (Signed)
[facility] [facility] EMERGENCY DEPARTMENT Provider Note   CSN: [id number] Arrival date & time: 03/06/19  1512     History Chief Complaint  Patient presents with  . Fall  . Knee Pain    Alex Morrison is a 49 y.o. male.  HPI   49 year old male with a history of diabetes, ESRD on dialysis, hypertension, who presents to the emergency department today for evaluation after a fall.  Patient states he fell off of a loading dock prior to arrival which is about 4 feet high.  He landed on his feet but felt sudden onset of pain to the right knee.  He is also complaining of some pain to the right ankle as well.  He denies any head trauma or other injuries from the fall.  Denies any missed dialysis sessions and just completed dialysis earlier today.  Past Medical History:  Diagnosis Date  . Abscess of buttock   . Acute kidney injury ([place]) 09/27/2013  . Diabetes mellitus without complication ([place])   . ESRD (end stage renal disease) ([place])   . HTN (hypertension) 08/08/2018  . Perforated appendix    Peritonitis    Patient Active Problem List   Diagnosis Date Noted  . Tibial plateau fracture 03/06/2019  . ESRD (end stage renal disease) ([place]) 08/08/2018  . HTN (hypertension) 08/08/2018  . SBO (small bowel obstruction) ([place]) 08/08/2018  . Periumbilical hernia [id number]  . Abdominal hernia as complication of peritoneal dialysis 08/08/2018  . Psoriasis 09/29/2013  . Acute gangrenous appendicitis with perforation and peritonitis 09/27/2013  . Type 1 diabetes mellitus ([place]) 09/27/2013  . Acute kidney injury ([place]) 09/27/2013  . DM (diabetes mellitus) type 2, uncontrolled, with ketoacidosis ([place]) 07/17/2012  . DKA (diabetic ketoacidosis) ([place]) 07/16/2012  . Perirectal abscess 07/14/2012    Past Surgical History:  Procedure Laterality Date  . AV FISTULA PLACEMENT Left 01/13/2019   Procedure: LEFT ARM ARTERIOVENOUS (AV) FISTULA CREATION;  Surgeon: Elam Dutch, MD;  Location:  [place];  Service: Vascular;  Laterality: Left;  . Ex lap with ileocecectomy  09/27/13   Dr. Brantley Stage  . INCISION AND DRAINAGE    . INCISIONAL HERNIA REPAIR N/A 08/11/2018   Procedure: OPEN RETRORECTUS REPAIR OF INCARCERATED INCISIONAL HERNIA WITH BILATERAL POSTERIOR COMPONENT SEPARATION;  Surgeon: [name], MD;  Location: [place];  Service: General;  Laterality: N/A;  . INSERTION OF MESH  08/11/2018   Procedure: INSERTION OF PHASIX MESH;  Surgeon: [name], MD;  Location: [place];  Service: General;;  . insertion of PD catheter  12/2017  . IR FLUORO GUIDE CV LINE RIGHT  08/08/2018  . IR US GUIDE VASC ACCESS RIGHT  08/08/2018  . LAPAROSCOPIC ABDOMINAL EXPLORATION N/A 09/27/2013  . LAPAROSCOPIC ASSISTED VENTRAL HERNIA REPAIR  2019  . LAPAROSCOPY N/A 09/27/2013   Procedure: LAPAROSCOPY DIAGNOSTIC;  Surgeon: Erroll Luna, MD;  Location: [place];  Service: General;  Laterality: N/A;  . LAPAROTOMY  08/11/2018   Procedure: EXPLORATORY LAPAROTOMY;  Surgeon: [name], MD;  Location: [place];  Service: General;;  . LYSIS OF ADHESION  08/11/2018   Procedure: LYSIS OF ADHESIONS X 30 MINUTES;  Surgeon: [name], MD;  Location: [place];  Service: General;;  . MINOR REMOVAL OF PERITONEAL DIALYSIS CATHETER N/A 01/06/2019   Procedure: REMOVAL OF PERITONEAL DIALYSIS CATHETER;  Surgeon: Kieth Brightly Arta Bruce, MD;  [facility];  Service: General;  Laterality: N/A;  . [place]       Family History  Problem  Relation Age of Onset  . Diabetes Mellitus II Mother   . Diabetes Mother   . Heart disease Mother   . Diabetes Mellitus I Father   . Diabetes Father   . Diabetes Mellitus II Brother   . Diabetes Brother     Social History   Tobacco Use  . Smoking status: Never Smoker  . Smokeless tobacco: Never Used  Substance Use Topics  . Alcohol use: No  . Drug use: No    Home Medications Prior to Admission medications   Medication Sig Start Date End Date Taking? Authorizing Provider    acetaminophen (TYLENOL) 500 MG tablet Take 2 tablets (1,000 mg total) by mouth every 8 (eight) hours as needed for mild pain, moderate pain or fever. 08/14/18   Hongalgi, Lenis Dickinson, MD  HYDROcodone-acetaminophen (NORCO) 5-325 MG tablet Take 1 tablet by mouth every 6 (six) hours as needed for moderate pain. Patient not taking: Reported on 02/19/2019 01/13/19   Dagoberto Ligas, PA-C  insulin aspart (NOVOLOG FLEXPEN) 100 UNIT/ML FlexPen Inject 3-7 Units into the skin 3 (three) times daily with meals.     [provider]  Insulin Glargine (LANTUS SOLOSTAR) 100 UNIT/ML Solostar Pen Inject 10 Units into the skin at bedtime.     [provider]    Allergies    Patient has no known allergies.  Review of Systems   Review of Systems  Constitutional: Negative for fever.  Musculoskeletal:       Right knee pain, right ankle pain  Neurological:       No head trauma or loc    Physical Exam Updated Vital Signs BP (!) 142/73 (BP Location: Right Arm)   Pulse 96   Temp 99 F (37.2 C) (Oral)   Resp 18   SpO2 100%   Physical Exam Constitutional:      General: He is not in acute distress.    Appearance: He is well-developed.  Eyes:     Conjunctiva/sclera: Conjunctivae normal.  Cardiovascular:     Rate and Rhythm: Normal rate and regular rhythm.  Pulmonary:     Effort: Pulmonary effort is normal.     Breath sounds: Normal breath sounds.  Musculoskeletal:     Comments: TTP to the proximal tibia and lateral aspect of the right knee.  TTP over the right lateral malleolus of the ankle with associated swelling.  Normal range of motion of the ankle.  Normal sensation distally with normal pulses distally as well.  Trace bilateral lower extremity edema.  Skin:    General: Skin is warm and dry.  Neurological:     Mental Status: He is alert and oriented to person, place, and time.     ED Results / Procedures / Treatments   Labs (all labs ordered are listed, but only abnormal results  are displayed) Labs Reviewed  SARS CORONAVIRUS 2 (TAT 6-24 HRS)  CBC WITH DIFFERENTIAL/PLATELET  BASIC METABOLIC PANEL    EKG None  Radiology DG Ankle Complete Right  Result Date: 03/06/2019 CLINICAL DATA:  Fall. EXAM: RIGHT ANKLE - COMPLETE 3+ VIEW COMPARISON:  No recent prior. FINDINGS: Soft tissue swelling over the lateral malleolus. Slightly displaced fracture noted of the distal right fibula. Medial malleolus appears intact. Peripheral vascular calcification. IMPRESSION: 1. Prominent soft tissue swelling over the lateral malleolus. Slightly displaced fracture of the distal right fibula noted. 2.  Peripheral vascular disease. Electronically Signed   By: [place]   On: 03/06/2019 16:12   DG Knee Complete 4  Views Right  Result Date: 03/06/2019 CLINICAL DATA:  Patient reports right anterior and lateral knee pains after approximate 4 ft fall off a loading dock today. Reports unable to bear weight on right knee. Denies any prior right knee injury or surgery. Hx of diabetes. EXAM: RIGHT KNEE - COMPLETE 4+ VIEW COMPARISON:  None. FINDINGS: Comminuted, mildly displaced and depressed fracture of the lateral tibial plateau. Maximum depression of a component of the fracture is 1.2 cm from the expected location of the articular surface. Fractures also mildly displaced laterally by approximately 1 cm. A component of the fracture extends to the medial margin of the tibial spine. No other fractures.  No dislocation. Fat fluid level reflecting a hemarthrosis distends the suprapatellar joint capsule. There are arterial atherosclerotic calcifications posteriorly, advanced for age. IMPRESSION: 1. Comminuted, displaced and depressed fracture of the lateral tibial plateau associated with a hemarthrosis. No dislocation. Electronically Signed   By: Lajean Manes M.D.   On: 03/06/2019 15:37    Procedures Procedures (including critical care time)  Medications Ordered in ED Medications - No data to  display  ED Course  I have reviewed the triage vital signs and the nursing notes.  Pertinent labs & imaging results that were available during my care of the patient were reviewed by me and considered in my medical decision making (see chart for details).    MDM Rules/Calculators/A&P                      49 year old male presenting for evaluation after mechanical fall.  Landed on both feet and is now experiencing right knee pain and right ankle pain.  X-ray of the right knee shows a comminuted displaced and depressed fracture of the lateral tibial plateau associated with a hemarthrosis.  -X-ray personally reviewed and I agree with the radiologist findings.  X-ray right ankle with prominent soft tissue swelling over the lateral malleolus. Slightly displaced fracture of the distal right fibula noted. Peripheral vascular disease.   - reviewed imagine and I agree with radiologist  4:35 PM  CONSULT With Dr. Lyla Glassing with orthopedics who recommends knee immobilizer, posterior ankle splint with U, nonweight bearing. Recommends admission to medicine due to patients multiple medical problems. He will contact ortho trauma surgeon to inform them of patient. Recommends CT right knee.  Discussed plan with pt. He states he is established with [facility] and would like to contact this group instead.   4:56 PM CONSULT with Dr. Mardelle Matte who agrees with plan for admission for surgery. He will contact Dr. Lyla Glassing and inform him of plan.   5:20 PM CONSULT with Dr. Cyndia Skeeters with hospitalist service who accepts patient for admission.    Final Clinical Impression(s) / ED Diagnoses Final diagnoses:  Fall, initial encounter  Closed fracture of right tibial plateau, initial encounter  Closed fracture of distal end of right fibula, unspecified fracture morphology, initial encounter    Rx / DC Orders ED Discharge Orders    None       Bishop Dublin 03/06/19 1723    Quintella Reichert,  MD 03/07/19 1229

## 2019-03-06 NOTE — Plan of Care (Signed)

## 2019-03-06 NOTE — H&P (Signed)
History and Physical    Alex Morrison [id number] DOB: 12/10/1970 DOA: 03/06/2019  PCP: Haywood Pao, MD Patient coming from: Home.  Active and independent at baseline.  Chief Complaint: Fall and right knee pain  HPI: Alex Morrison is a 49 y.o. male with history of ESRD on HD MWF, DM-1, HTN and recent hospitalization at [facility] regional from 2/9-2/15 with Streptococcus mitis bacteremia discharged on Ancef with HD.he completed the morning of admission.   Patient went to his work after dialysis currently this morning.  He was loading a box when he fell off a loading dock about 4 feet high.  He landed on his feet and felt pain and discomfort in his right knee and right ankle.  Was not able to walk.  Noted some swelling but no bruising.  He denies any head trauma or loss of consciousness.  He denies any prodromes leading to this other than mild lightheadedness which is not unusual for him after hemodialysis.  He completed his dialysis session earlier this morning.  Patient denies headache, vision change, chest pain, dyspnea, palpitation, nausea, vomiting, abdominal pain, and focal weakness.  He feels numbness in his right leg.  Lives with his brother.  Denies smoking cigarettes, drinking alcohol recreational drug use.  Wishes to be full code.  In ED, hemodynamically stable.  Mild temp to 99 degree.  100% on RA.  Basic labs pending.  The right knee with comminuted, displaced and depressed fracture lateral tibial plateau associated with hemarthrosis.  DG right ankle with prominent soft tissue swelling over the lateral malleolus and a slightly displaced fracture of the distal right fibula.  Orthopedic surgery consulted and recommended CT right knee and admission to medicine service.  Basic labs and COVID-19 pending.  ROS All review of system negative except for pertinent positives and negatives as history of present illness above.  PMH Past Medical History:  Diagnosis Date  . Abscess  of buttock   . Acute kidney injury ([place]) 09/27/2013  . Diabetes mellitus without complication ([place])   . ESRD (end stage renal disease) ([place])   . HTN (hypertension) 08/08/2018  . Perforated appendix    Peritonitis   PSH Past Surgical History:  Procedure Laterality Date  . AV FISTULA PLACEMENT Left 01/13/2019   Procedure: LEFT ARM ARTERIOVENOUS (AV) FISTULA CREATION;  Surgeon: Elam Dutch, MD;  Location: [place];  Service: Vascular;  Laterality: Left;  . Ex lap with ileocecectomy  09/27/13   Dr. Brantley Stage  . INCISION AND DRAINAGE    . INCISIONAL HERNIA REPAIR N/A 08/11/2018   Procedure: OPEN RETRORECTUS REPAIR OF INCARCERATED INCISIONAL HERNIA WITH BILATERAL POSTERIOR COMPONENT SEPARATION;  Surgeon: [name], MD;  Location: [place];  Service: General;  Laterality: N/A;  . INSERTION OF MESH  08/11/2018   Procedure: INSERTION OF PHASIX MESH;  Surgeon: [name], MD;  Location: [place];  Service: General;;  . insertion of PD catheter  12/2017  . IR FLUORO GUIDE CV LINE RIGHT  08/08/2018  . IR US GUIDE VASC ACCESS RIGHT  08/08/2018  . LAPAROSCOPIC ABDOMINAL EXPLORATION N/A 09/27/2013  . LAPAROSCOPIC ASSISTED VENTRAL HERNIA REPAIR  2019  . LAPAROSCOPY N/A 09/27/2013   Procedure: LAPAROSCOPY DIAGNOSTIC;  Surgeon: Erroll Luna, MD;  Location: [place];  Service: General;  Laterality: N/A;  . LAPAROTOMY  08/11/2018   Procedure: EXPLORATORY LAPAROTOMY;  Surgeon: [name], MD;  Location: [place];  Service: General;;  . LYSIS OF ADHESION  08/11/2018   Procedure: LYSIS OF  ADHESIONS X 30 MINUTES;  Surgeon: [name], MD;  Location: [place];  Service: General;;  . MINOR REMOVAL OF PERITONEAL DIALYSIS CATHETER N/A 01/06/2019   Procedure: REMOVAL OF PERITONEAL DIALYSIS CATHETER;  Surgeon: Kieth Brightly, Arta Bruce, MD;  Location: [facility];  Service: General;  Laterality: N/A;  . SHOULDER SURGERY  1989   Fam HX Family History  Problem Relation Age of Onset  . Diabetes Mellitus II Mother   . Diabetes Mother    . Heart disease Mother   . Diabetes Mellitus I Father   . Diabetes Father   . Diabetes Mellitus II Brother   . Diabetes Brother    Social Hx  reports that he has never smoked. He has never used smokeless tobacco. He reports that he does not drink alcohol or use drugs.   Allergy No Known Allergies Home Meds Prior to Admission medications   Medication Sig Start Date End Date Taking? Authorizing Provider  acetaminophen (TYLENOL) 500 MG tablet Take 2 tablets (1,000 mg total) by mouth every 8 (eight) hours as needed for mild pain, moderate pain or fever. Patient taking differently: Take 500 mg by mouth every 8 (eight) hours as needed for mild pain, moderate pain or fever.  08/14/18  Yes Hongalgi, Lenis Dickinson, MD  amLODipine (NORVASC) 10 MG tablet Take 10 mg by mouth daily. 03/02/19  Yes [provider]  insulin aspart (NOVOLOG FLEXPEN) 100 UNIT/ML FlexPen Inject 3-9 Units into the skin 3 (three) times daily with meals.    Yes [provider]  Insulin Glargine (LANTUS SOLOSTAR) 100 UNIT/ML Solostar Pen Inject 10 Units into the skin at bedtime.    Yes [provider]  sevelamer carbonate (RENVELA) 800 MG tablet Take 800-2,400 mg by mouth See admin instructions. Take 3 tablets (2400 mg) by mouth two times daily with meals, take 1-2 tablets ([phone] mg) with snacks. 01/20/19  Yes [provider]  [place]     [provider]    Physical Exam: Vitals:   03/06/19 1514  BP: (!) 142/73  Pulse: 96  Resp: 18  Temp: 99 F (37.2 C)  TempSrc: Oral  SpO2: 100%    GENERAL: No acute distress.  Appears well.  HEENT: MMM.  Vision and hearing grossly intact.  NECK: Supple.  No apparent JVD.  RESP:  No IWOB. Good air movement bilaterally. CVS:  RRR. Heart sounds normal.  aVF in LUE.  TDC over left chest. ABD/GI/GU: Bowel sounds present. Soft. Non tender.  MSK/EXT: Right lower extremity in knee immobilizer.  Neurovascular intact in his  toes. SKIN: no apparent skin lesion or wound NEURO: Awake, alert and oriented appropriately.  No gross deficit.  PSYCH: Calm. Normal affect.   Personally Reviewed Radiological Exams DG Ankle Complete Right  Result Date: 03/06/2019 CLINICAL DATA:  Fall. EXAM: RIGHT ANKLE - COMPLETE 3+ VIEW COMPARISON:  No recent prior. FINDINGS: Soft tissue swelling over the lateral malleolus. Slightly displaced fracture noted of the distal right fibula. Medial malleolus appears intact. Peripheral vascular calcification. IMPRESSION: 1. Prominent soft tissue swelling over the lateral malleolus. Slightly displaced fracture of the distal right fibula noted. 2.  Peripheral vascular disease. Electronically Signed   By: Marcello Moores  Register   On: 03/06/2019 16:12   DG Knee Complete 4 Views Right  Result Date: 03/06/2019 CLINICAL DATA:  Patient reports right anterior and lateral knee pains after approximate 4 ft fall off a loading dock today. Reports unable to bear weight on right knee. Denies any prior right knee  injury or surgery. Hx of diabetes. EXAM: RIGHT KNEE - COMPLETE 4+ VIEW COMPARISON:  None. FINDINGS: Comminuted, mildly displaced and depressed fracture of the lateral tibial plateau. Maximum depression of a component of the fracture is 1.2 cm from the expected location of the articular surface. Fractures also mildly displaced laterally by approximately 1 cm. A component of the fracture extends to the medial margin of the tibial spine. No other fractures.  No dislocation. Fat fluid level reflecting a hemarthrosis distends the suprapatellar joint capsule. There are arterial atherosclerotic calcifications posteriorly, advanced for age. IMPRESSION: 1. Comminuted, displaced and depressed fracture of the lateral tibial plateau associated with a hemarthrosis. No dislocation. Electronically Signed   By: Lajean Manes M.D.   On: 03/06/2019 15:37     Personally Reviewed Labs: CBC: No results for input(s): WBC, NEUTROABS, HGB,  HCT, MCV, PLT in the last 168 hours. Basic Metabolic Panel: No results for input(s): NA, K, CL, CO2, GLUCOSE, BUN, CREATININE, CALCIUM, MG, PHOS in the last 168 hours. GFR: CrCl cannot be calculated (Patient's most recent lab result is older than the maximum 21 days allowed.). Liver Function Tests: No results for input(s): AST, ALT, ALKPHOS, BILITOT, PROT, ALBUMIN in the last 168 hours. No results for input(s): LIPASE, AMYLASE in the last 168 hours. No results for input(s): AMMONIA in the last 168 hours. Coagulation Profile: No results for input(s): INR, PROTIME in the last 168 hours. Cardiac Enzymes: No results for input(s): CKTOTAL, CKMB, CKMBINDEX, TROPONINI in the last 168 hours. BNP (last 3 results) No results for input(s): PROBNP in the last 8760 hours. HbA1C: No results for input(s): HGBA1C in the last 72 hours. CBG: No results for input(s): GLUCAP in the last 168 hours. Lipid Profile: No results for input(s): CHOL, HDL, LDLCALC, TRIG, CHOLHDL, LDLDIRECT in the last 72 hours. Thyroid Function Tests: No results for input(s): TSH, T4TOTAL, FREET4, T3FREE, THYROIDAB in the last 72 hours. Anemia Panel: No results for input(s): VITAMINB12, FOLATE, FERRITIN, TIBC, IRON, RETICCTPCT in the last 72 hours. Urine analysis:    Component Value Date/Time   COLORURINE STRAW (A) 08/07/2018 2328   APPEARANCEUR CLEAR 08/07/2018 2328   LABSPEC 1.010 08/07/2018 2328   PHURINE 6.0 08/07/2018 2328   GLUCOSEU >=500 (A) 08/07/2018 2328   HGBUR SMALL (A) 08/07/2018 2328   BILIRUBINUR NEGATIVE 08/07/2018 2328   KETONESUR NEGATIVE 08/07/2018 2328   PROTEINUR 100 (A) 08/07/2018 2328   UROBILINOGEN 0.2 09/28/2013 1034   NITRITE NEGATIVE 08/07/2018 2328   LEUKOCYTESUR NEGATIVE 08/07/2018 2328    Sepsis Labs:  None  Personally Reviewed EKG:  No EKG ordered.  Assessment/Plan Mechanical fall from loading dock about 4 feet high Comminuted, displaced and depressed fracture lateral tibial  plateau associated with hemarthrosis-noted on x-ray and CT. Displaced fracture of the distal right fibula-noted on x-ray. -Follow orthopedic surgery recommendation-consulted by EDP. -Pain control with Tylenol and as needed fentanyl -SCD for VTE prophylaxis. -Patient has no cardiopulmonary risk factors  Uncontrolled DM-1: A1c 8.8% on 01/02/2019.  On Lantus 10 units at bedtime and SSI. -Check hemoglobin A1c -Lantus 5 units, NovoLog AC 2 units and SSI-ESRD  ESRD on HD MWF: Reportedly completed full dialysis session this morning. -Notify nephrology in the morning  Anemia of renal disease: Hgb 8.9 which is about baseline per care everywhere. -Continue monitoring -Check iron panel  Essential hypertension: BP slightly elevated -Continue home amlodipine  Streptococcus mitis bacteremia-hospitalized for this at [facility] 2/9-2/15 and discharged on Ancef with HD that he completed the morning  of admission.  Bacteremia thought to be from HD cath.  HD cath replaced.  TTE reportedly negative for vegetation.  DVT prophylaxis: SCD  Code Status: Full code-confirmed on admission. Family Communication: Patient updated his brother.  Disposition Plan: Admit to MedSurg Consults called: Orthopedic surgery Admission status: Inpatient   Mercy Riding MD [facility]  If 7PM-7AM, please contact night-coverage [email or web address] Password [facility]  03/06/2019, 5:42 PM

## 2019-03-06 NOTE — ED Notes (Signed)
Patient transported to X-ray 

## 2019-03-06 NOTE — ED Triage Notes (Signed)
Pt bib ems for mechanical fall, c.o right knee pain, pt fell off a 4 ft wall. Swelling and abrasion noted to knee, no LOC, no other injuries from fall.

## 2019-03-07 ENCOUNTER — Inpatient Hospital Stay (HOSPITAL_COMMUNITY): Payer: BC Managed Care – PPO

## 2019-03-07 ENCOUNTER — Inpatient Hospital Stay (HOSPITAL_COMMUNITY): Payer: BC Managed Care – PPO | Admitting: Critical Care Medicine

## 2019-03-07 ENCOUNTER — Encounter (HOSPITAL_COMMUNITY): Payer: Self-pay | Admitting: Student

## 2019-03-07 ENCOUNTER — Encounter (HOSPITAL_COMMUNITY)
Admission: EM | Disposition: A | Discharge status: Home Health | Payer: Self-pay | Source: Home / Self Care | Attending: Student

## 2019-03-07 DIAGNOSIS — M2500 Hemarthrosis, unspecified joint: Secondary | ICD-10-CM | POA: Diagnosis not present

## 2019-03-07 DIAGNOSIS — S82831A Other fracture of upper and lower end of right fibula, initial encounter for closed fracture: Secondary | ICD-10-CM | POA: Diagnosis not present

## 2019-03-07 DIAGNOSIS — S82143A Displaced bicondylar fracture of unspecified tibia, initial encounter for closed fracture: Secondary | ICD-10-CM | POA: Diagnosis not present

## 2019-03-07 DIAGNOSIS — N186 End stage renal disease: Secondary | ICD-10-CM | POA: Diagnosis not present

## 2019-03-07 DIAGNOSIS — N179 Acute kidney failure, unspecified: Secondary | ICD-10-CM | POA: Diagnosis not present

## 2019-03-07 DIAGNOSIS — N189 Chronic kidney disease, unspecified: Secondary | ICD-10-CM

## 2019-03-07 DIAGNOSIS — S8261XA Displaced fracture of lateral malleolus of right fibula, initial encounter for closed fracture: Secondary | ICD-10-CM | POA: Diagnosis not present

## 2019-03-07 DIAGNOSIS — S82401A Unspecified fracture of shaft of right fibula, initial encounter for closed fracture: Secondary | ICD-10-CM | POA: Diagnosis not present

## 2019-03-07 DIAGNOSIS — D631 Anemia in chronic kidney disease: Secondary | ICD-10-CM

## 2019-03-07 DIAGNOSIS — M7989 Other specified soft tissue disorders: Secondary | ICD-10-CM | POA: Diagnosis not present

## 2019-03-07 DIAGNOSIS — W19XXXA Unspecified fall, initial encounter: Secondary | ICD-10-CM | POA: Diagnosis not present

## 2019-03-07 DIAGNOSIS — I120 Hypertensive chronic kidney disease with stage 5 chronic kidney disease or end stage renal disease: Secondary | ICD-10-CM | POA: Diagnosis not present

## 2019-03-07 DIAGNOSIS — S82141A Displaced bicondylar fracture of right tibia, initial encounter for closed fracture: Secondary | ICD-10-CM | POA: Diagnosis not present

## 2019-03-07 DIAGNOSIS — E1010 Type 1 diabetes mellitus with ketoacidosis without coma: Secondary | ICD-10-CM | POA: Diagnosis not present

## 2019-03-07 HISTORY — PX: ORIF TIBIA PLATEAU: SHX2132

## 2019-03-07 HISTORY — PX: OPEN REDUCTION INTERNAL FIXATION (ORIF) TIBIA/FIBULA FRACTURE: SHX5992

## 2019-03-07 LAB — BASIC METABOLIC PANEL
Anion gap: 12 (ref 5–15)
BUN: 22 mg/dL — ABNORMAL HIGH (ref 6–20)
CO2: 29 mmol/L (ref 22–32)
Calcium: 7.8 mg/dL — ABNORMAL LOW (ref 8.9–10.3)
Chloride: 93 mmol/L — ABNORMAL LOW (ref 98–111)
Creatinine, Ser: 5.72 mg/dL — ABNORMAL HIGH (ref 0.61–1.24)
GFR calc Af Amer: 12 mL/min — ABNORMAL LOW (ref >60)
GFR calc non Af Amer: 11 mL/min — ABNORMAL LOW (ref >60)
Glucose, Bld: 439 mg/dL — ABNORMAL HIGH (ref 70–99)
Potassium: 3.9 mmol/L (ref 3.5–5.1)
Sodium: 134 mmol/L — ABNORMAL LOW (ref 135–145)

## 2019-03-07 LAB — GLUCOSE, CAPILLARY
Glucose-Capillary: 389 mg/dL — ABNORMAL HIGH (ref 70–99)
Glucose-Capillary: 285 mg/dL — ABNORMAL HIGH (ref 70–99)
Glucose-Capillary: 125 mg/dL — ABNORMAL HIGH (ref 70–99)
Glucose-Capillary: 155 mg/dL — ABNORMAL HIGH (ref 70–99)
Glucose-Capillary: 165 mg/dL — ABNORMAL HIGH (ref 70–99)
Glucose-Capillary: 169 mg/dL — ABNORMAL HIGH (ref 70–99)
Glucose-Capillary: 163 mg/dL — ABNORMAL HIGH (ref 70–99)

## 2019-03-07 LAB — CBC
HCT: 23.9 % — ABNORMAL LOW (ref 39.0–52.0)
Hemoglobin: 7.5 g/dL — ABNORMAL LOW (ref 13.0–17.0)
MCH: 28.1 pg (ref 26.0–34.0)
MCHC: 31.4 g/dL (ref 30.0–36.0)
MCV: 89.5 fL (ref 80.0–100.0)
Platelets: 251 10*3/uL (ref 150–400)
RBC: 2.67 MIL/uL — ABNORMAL LOW (ref 4.22–5.81)
RDW: 15 % (ref 11.5–15.5)
WBC: 7.1 10*3/uL (ref 4.0–10.5)
nRBC: 0 % (ref 0.0–0.2)

## 2019-03-07 LAB — TYPE AND SCREEN
ABO/RH(D): O POS
Antibody Screen: NEGATIVE

## 2019-03-07 SURGERY — OPEN REDUCTION INTERNAL FIXATION (ORIF) TIBIA/FIBULA FRACTURE
Anesthesia: General | Site: Leg Lower | Laterality: Right

## 2019-03-07 MED ORDER — INSULIN ASPART 100 UNIT/ML ~~LOC~~ SOLN
3.0000 [IU] | Freq: Three times a day (TID) | SUBCUTANEOUS | Status: DC
  Administered 2019-03-08: 09:00:00 2 [IU] via SUBCUTANEOUS

## 2019-03-07 MED ORDER — PROPOFOL 10 MG/ML IV BOLUS
INTRAVENOUS | Status: DC | PRN
  Administered 2019-03-07: 110 mg via INTRAVENOUS

## 2019-03-07 MED ORDER — SUCCINYLCHOLINE CHLORIDE 200 MG/10ML IV SOSY
PREFILLED_SYRINGE | INTRAVENOUS | Status: AC
Start: 2019-03-07 — End: ?
  Filled 2019-03-07: qty 10

## 2019-03-07 MED ORDER — HYDROMORPHONE HCL 1 MG/ML IJ SOLN
0.5000 mg | INTRAMUSCULAR | Status: DC | PRN
  Administered 2019-03-08 – 2019-03-09 (×6): 1 mg via INTRAVENOUS
  Filled 2019-03-07 (×7): qty 1

## 2019-03-07 MED ORDER — PHENYLEPHRINE HCL-NACL 10-0.9 MG/250ML-% IV SOLN
INTRAVENOUS | Status: DC | PRN
  Administered 2019-03-07: 45 ug/min via INTRAVENOUS

## 2019-03-07 MED ORDER — ONDANSETRON HCL 4 MG/2ML IJ SOLN
INTRAMUSCULAR | Status: AC
Start: 2019-03-07 — End: 2019-03-08
  Filled 2019-03-07: qty 2

## 2019-03-07 MED ORDER — INSULIN ASPART 100 UNIT/ML ~~LOC~~ SOLN: 0.0000 [IU] | Freq: Every day | SUBCUTANEOUS | Status: DC

## 2019-03-07 MED ORDER — DIPHENHYDRAMINE HCL 12.5 MG/5ML PO ELIX: 12.5000 mg | ORAL_SOLUTION | ORAL | Status: DC | PRN

## 2019-03-07 MED ORDER — MIDAZOLAM HCL 2 MG/2ML IJ SOLN
INTRAMUSCULAR | Status: AC
  Filled 2019-03-07: qty 2

## 2019-03-07 MED ORDER — CEFAZOLIN SODIUM-DEXTROSE 2-3 GM-%(50ML) IV SOLR
INTRAVENOUS | Status: DC | PRN
  Administered 2019-03-07: 2 g via INTRAVENOUS

## 2019-03-07 MED ORDER — ARTIFICIAL TEARS OPHTHALMIC OINT
TOPICAL_OINTMENT | OPHTHALMIC | Status: AC
  Filled 2019-03-07: qty 3.5

## 2019-03-07 MED ORDER — METOCLOPRAMIDE HCL 5 MG PO TABS: 5.0000 mg | ORAL_TABLET | Freq: Three times a day (TID) | ORAL | Status: DC | PRN

## 2019-03-07 MED ORDER — HEPARIN SODIUM (PORCINE) 5000 UNIT/ML IJ SOLN
5000.0000 [IU] | Freq: Three times a day (TID) | INTRAMUSCULAR | Status: DC
  Administered 2019-03-07 – 2019-03-13 (×17): 5000 [IU] via SUBCUTANEOUS
  Filled 2019-03-07 (×17): qty 1

## 2019-03-07 MED ORDER — ONDANSETRON HCL 4 MG/2ML IJ SOLN
INTRAMUSCULAR | Status: DC | PRN
  Administered 2019-03-07: 4 mg via INTRAVENOUS

## 2019-03-07 MED ORDER — DOCUSATE SODIUM 100 MG PO CAPS
100.0000 mg | ORAL_CAPSULE | Freq: Two times a day (BID) | ORAL | Status: DC
  Administered 2019-03-07 – 2019-03-13 (×11): 100 mg via ORAL
  Filled 2019-03-07 (×11): qty 1

## 2019-03-07 MED ORDER — INSULIN ASPART 100 UNIT/ML ~~LOC~~ SOLN
5.0000 [IU] | Freq: Once | SUBCUTANEOUS | Status: AC
  Administered 2019-03-07: 5 [IU] via SUBCUTANEOUS

## 2019-03-07 MED ORDER — LIDOCAINE 2% (20 MG/ML) 5 ML SYRINGE
INTRAMUSCULAR | Status: DC | PRN
  Administered 2019-03-07: 100 mg via INTRAVENOUS

## 2019-03-07 MED ORDER — FENTANYL CITRATE (PF) 250 MCG/5ML IJ SOLN
INTRAMUSCULAR | Status: AC
  Filled 2019-03-07: qty 5

## 2019-03-07 MED ORDER — MEPERIDINE HCL 25 MG/ML IJ SOLN: 6.2500 mg | INTRAMUSCULAR | Status: DC | PRN

## 2019-03-07 MED ORDER — POLYETHYLENE GLYCOL 3350 17 G PO PACK: 17.0000 g | PACK | Freq: Every day | ORAL | Status: DC | PRN

## 2019-03-07 MED ORDER — FENTANYL CITRATE (PF) 100 MCG/2ML IJ SOLN
INTRAMUSCULAR | Status: DC | PRN
  Administered 2019-03-07: 50 ug via INTRAVENOUS
  Administered 2019-03-07 (×2): 100 ug via INTRAVENOUS

## 2019-03-07 MED ORDER — MIDAZOLAM HCL 5 MG/5ML IJ SOLN
INTRAMUSCULAR | Status: DC | PRN
  Administered 2019-03-07 (×2): 1 mg via INTRAVENOUS

## 2019-03-07 MED ORDER — ZOLPIDEM TARTRATE 5 MG PO TABS: 5.0000 mg | ORAL_TABLET | Freq: Every evening | ORAL | Status: DC | PRN

## 2019-03-07 MED ORDER — METHOCARBAMOL 1000 MG/10ML IJ SOLN
500.0000 mg | Freq: Four times a day (QID) | INTRAVENOUS | Status: DC | PRN
  Filled 2019-03-07: qty 5

## 2019-03-07 MED ORDER — HYDROMORPHONE HCL 1 MG/ML IJ SOLN
INTRAMUSCULAR | Status: DC | PRN
  Administered 2019-03-07: .5 mg via INTRAVENOUS

## 2019-03-07 MED ORDER — ACETAMINOPHEN 500 MG PO TABS
1000.0000 mg | ORAL_TABLET | Freq: Four times a day (QID) | ORAL | Status: AC
  Administered 2019-03-08 (×4): 1000 mg via ORAL
  Filled 2019-03-07 (×4): qty 2

## 2019-03-07 MED ORDER — SUCCINYLCHOLINE CHLORIDE 20 MG/ML IJ SOLN
INTRAMUSCULAR | Status: DC | PRN
  Administered 2019-03-07: 150 mg via INTRAVENOUS

## 2019-03-07 MED ORDER — LABETALOL HCL 5 MG/ML IV SOLN: 10.0000 mg | INTRAVENOUS | Status: DC | PRN

## 2019-03-07 MED ORDER — INSULIN ASPART 100 UNIT/ML ~~LOC~~ SOLN
0.0000 [IU] | Freq: Three times a day (TID) | SUBCUTANEOUS | Status: DC
  Administered 2019-03-07: 08:00:00 4 [IU] via SUBCUTANEOUS
  Administered 2019-03-08 (×2): 2 [IU] via SUBCUTANEOUS
  Administered 2019-03-09: 3 [IU] via SUBCUTANEOUS

## 2019-03-07 MED ORDER — SENNA 8.6 MG PO TABS
1.0000 | ORAL_TABLET | Freq: Two times a day (BID) | ORAL | Status: DC
  Administered 2019-03-07 – 2019-03-13 (×10): 8.6 mg via ORAL
  Filled 2019-03-07 (×10): qty 1

## 2019-03-07 MED ORDER — HYDROMORPHONE HCL 1 MG/ML IJ SOLN: 0.2500 mg | INTRAMUSCULAR | Status: DC | PRN

## 2019-03-07 MED ORDER — OXYCODONE HCL 5 MG PO TABS
10.0000 mg | ORAL_TABLET | ORAL | Status: DC | PRN
  Administered 2019-03-07 – 2019-03-08 (×4): 15 mg via ORAL
  Administered 2019-03-08 – 2019-03-13 (×5): 10 mg via ORAL
  Filled 2019-03-07 (×4): qty 3
  Filled 2019-03-07: qty 2

## 2019-03-07 MED ORDER — 0.9 % SODIUM CHLORIDE (POUR BTL) OPTIME
TOPICAL | Status: DC | PRN
  Administered 2019-03-07: 1000 mL

## 2019-03-07 MED ORDER — ONDANSETRON HCL 4 MG/2ML IJ SOLN
4.0000 mg | Freq: Four times a day (QID) | INTRAMUSCULAR | Status: DC | PRN
  Administered 2019-03-08: 16:00:00 4 mg via INTRAVENOUS
  Filled 2019-03-07: qty 2

## 2019-03-07 MED ORDER — FENTANYL CITRATE (PF) 100 MCG/2ML IJ SOLN: 50.0000 ug | Freq: Once | INTRAMUSCULAR | Status: AC

## 2019-03-07 MED ORDER — CEFAZOLIN SODIUM-DEXTROSE 2-4 GM/100ML-% IV SOLN
2.0000 g | Freq: Four times a day (QID) | INTRAVENOUS | Status: AC
  Administered 2019-03-07 – 2019-03-08 (×3): 2 g via INTRAVENOUS
  Filled 2019-03-07 (×3): qty 100

## 2019-03-07 MED ORDER — METOCLOPRAMIDE HCL 5 MG/ML IJ SOLN: 5.0000 mg | Freq: Three times a day (TID) | INTRAMUSCULAR | Status: DC | PRN

## 2019-03-07 MED ORDER — ONDANSETRON HCL 4 MG/2ML IJ SOLN
INTRAMUSCULAR | Status: AC
  Filled 2019-03-07: qty 2

## 2019-03-07 MED ORDER — LIDOCAINE 2% (20 MG/ML) 5 ML SYRINGE
INTRAMUSCULAR | Status: AC
  Filled 2019-03-07: qty 5

## 2019-03-07 MED ORDER — HYDROMORPHONE HCL 1 MG/ML IJ SOLN
INTRAMUSCULAR | Status: AC
  Filled 2019-03-07: qty 0.5

## 2019-03-07 MED ORDER — INSULIN DETEMIR 100 UNIT/ML ~~LOC~~ SOLN
6.0000 [IU] | Freq: Two times a day (BID) | SUBCUTANEOUS | Status: DC
  Administered 2019-03-07: 6 [IU] via SUBCUTANEOUS
  Filled 2019-03-07 (×4): qty 0.06

## 2019-03-07 MED ORDER — ONDANSETRON HCL 4 MG PO TABS: 4.0000 mg | ORAL_TABLET | Freq: Four times a day (QID) | ORAL | Status: DC | PRN

## 2019-03-07 MED ORDER — OXYCODONE HCL 5 MG PO TABS
5.0000 mg | ORAL_TABLET | ORAL | Status: DC | PRN
  Administered 2019-03-09 – 2019-03-12 (×15): 10 mg via ORAL
  Filled 2019-03-07 (×10): qty 2
  Filled 2019-03-07: qty 1
  Filled 2019-03-07 (×8): qty 2

## 2019-03-07 MED ORDER — MAGNESIUM CITRATE PO SOLN: 1.0000 | Freq: Once | ORAL | Status: DC | PRN

## 2019-03-07 MED ORDER — METHOCARBAMOL 500 MG PO TABS
500.0000 mg | ORAL_TABLET | Freq: Four times a day (QID) | ORAL | Status: DC | PRN
  Administered 2019-03-07 – 2019-03-13 (×12): 500 mg via ORAL
  Filled 2019-03-07 (×12): qty 1

## 2019-03-07 MED ORDER — BISACODYL 10 MG RE SUPP: 10.0000 mg | Freq: Every day | RECTAL | Status: DC | PRN

## 2019-03-07 MED ORDER — SODIUM CHLORIDE 0.9 % IV SOLN: INTRAVENOUS | Status: DC

## 2019-03-07 MED ORDER — HYDROMORPHONE HCL 1 MG/ML IJ SOLN
0.5000 mg | INTRAMUSCULAR | Status: DC | PRN
  Administered 2019-03-07 (×2): 0.5 mg via INTRAVENOUS
  Filled 2019-03-07 (×2): qty 1

## 2019-03-07 MED ORDER — FENTANYL CITRATE (PF) 100 MCG/2ML IJ SOLN
INTRAMUSCULAR | Status: AC
  Administered 2019-03-07: 50 ug via INTRAVENOUS
  Filled 2019-03-07: qty 2

## 2019-03-07 MED ORDER — ACETAMINOPHEN 325 MG PO TABS: 325.0000 mg | ORAL_TABLET | Freq: Four times a day (QID) | ORAL | Status: DC | PRN

## 2019-03-07 MED ORDER — KETOROLAC TROMETHAMINE 15 MG/ML IJ SOLN: 7.5000 mg | Freq: Four times a day (QID) | INTRAMUSCULAR | Status: DC

## 2019-03-07 MED ORDER — CHLORHEXIDINE GLUCONATE CLOTH 2 % EX PADS: 6.0000 | MEDICATED_PAD | Freq: Every day | CUTANEOUS | Status: DC

## 2019-03-07 MED ORDER — ONDANSETRON HCL 4 MG/2ML IJ SOLN
4.0000 mg | Freq: Once | INTRAMUSCULAR | Status: AC | PRN
  Administered 2019-03-07: 21:00:00 4 mg via INTRAVENOUS

## 2019-03-07 SURGICAL SUPPLY — 99 items
APL SKNCLS STERI-STRIP NONHPOA (GAUZE/BANDAGES/DRESSINGS)
BANDAGE ESMARK 6X9 LF (GAUZE/BANDAGES/DRESSINGS) IMPLANT
BENZOIN TINCTURE PRP APPL 2/3 (GAUZE/BANDAGES/DRESSINGS) IMPLANT
BIT DRILL 2.5X2.75 QC CALB (BIT) ×1 IMPLANT
BIT DRILL 3.5X5.5 QC CALB (BIT) ×1 IMPLANT
BIT DRILL CAL (BIT) IMPLANT
BIT DRILL CALIBRATED 2.7 (BIT) ×1 IMPLANT
BNDG CMPR 9X6 STRL LF SNTH (GAUZE/BANDAGES/DRESSINGS)
BNDG ELASTIC 3X5.8 VLCR STR LF (GAUZE/BANDAGES/DRESSINGS) ×1 IMPLANT
BNDG ELASTIC 4X5.8 VLCR STR LF (GAUZE/BANDAGES/DRESSINGS) ×2 IMPLANT
BNDG ELASTIC 6X5.8 VLCR STR LF (GAUZE/BANDAGES/DRESSINGS) ×2 IMPLANT
BNDG ESMARK 6X9 LF (GAUZE/BANDAGES/DRESSINGS)
BONE CANC CHIPS 20CC PCAN1/4 (Bone Implant) ×2 IMPLANT
BOOTCOVER CLEANROOM LRG (PROTECTIVE WEAR) IMPLANT
CANISTER SUCT 3000ML PPV (MISCELLANEOUS) ×2 IMPLANT
CHIPS CANC BONE 20CC PCAN1/4 (Bone Implant) ×1 IMPLANT
CLSR STERI-STRIP ANTIMIC 1/2X4 (GAUZE/BANDAGES/DRESSINGS) IMPLANT
COVER SURGICAL LIGHT HANDLE (MISCELLANEOUS) ×2 IMPLANT
COVER WAND RF STERILE (DRAPES) IMPLANT
CUFF TOURN SGL QUICK 34 (TOURNIQUET CUFF) ×2
CUFF TOURN SGL QUICK 42 (TOURNIQUET CUFF) IMPLANT
CUFF TRNQT CYL 34X4.125X (TOURNIQUET CUFF) IMPLANT
DECANTER SPIKE VIAL GLASS SM (MISCELLANEOUS) IMPLANT
DRAPE C-ARMOR (DRAPES) ×1 IMPLANT
DRAPE INCISE IOBAN 66X45 STRL (DRAPES) ×1 IMPLANT
DRAPE OEC MINIVIEW 54X84 (DRAPES) ×1 IMPLANT
DRAPE U-SHAPE 47X51 STRL (DRAPES) IMPLANT
DRILL BIT CAL (BIT) ×2
DRSG MEPILEX BORDER 4X12 (GAUZE/BANDAGES/DRESSINGS) ×1 IMPLANT
DRSG MEPILEX BORDER 4X8 (GAUZE/BANDAGES/DRESSINGS) ×1 IMPLANT
DRSG PAD ABDOMINAL 8X10 ST (GAUZE/BANDAGES/DRESSINGS) ×1 IMPLANT
DURAPREP 26ML APPLICATOR (WOUND CARE) ×3 IMPLANT
ELECT REM PT RETURN 9FT ADLT (ELECTROSURGICAL) ×2
ELECTRODE REM PT RTRN 9FT ADLT (ELECTROSURGICAL) ×1 IMPLANT
GAUZE SPONGE 4X4 12PLY STRL (GAUZE/BANDAGES/DRESSINGS) ×1 IMPLANT
GAUZE XEROFORM 1X8 LF (GAUZE/BANDAGES/DRESSINGS) ×4 IMPLANT
GLOVE BIOGEL PI ORTHO PRO SZ8 (GLOVE) ×2
GLOVE ORTHO TXT STRL SZ7.5 (GLOVE) ×2 IMPLANT
GLOVE PI ORTHO PRO STRL SZ8 (GLOVE) ×2 IMPLANT
GLOVE SURG ORTHO 8.0 STRL STRW (GLOVE) ×2 IMPLANT
GOWN STRL REUS W/ TWL LRG LVL3 (GOWN DISPOSABLE) ×1 IMPLANT
GOWN STRL REUS W/ TWL XL LVL3 (GOWN DISPOSABLE) ×2 IMPLANT
GOWN STRL REUS W/TWL 2XL LVL3 (GOWN DISPOSABLE) ×1 IMPLANT
GOWN STRL REUS W/TWL LRG LVL3 (GOWN DISPOSABLE) ×6
GOWN STRL REUS W/TWL XL LVL3 (GOWN DISPOSABLE) ×4
GRAFT BNE CANC CHIPS 1-8 20CC (Bone Implant) IMPLANT
IMMOBILIZER KNEE 22 UNIV (SOFTGOODS) ×1 IMPLANT
K-WIRE ACE 1.6X6 (WIRE) ×4
KIT BASIN OR (CUSTOM PROCEDURE TRAY) ×2 IMPLANT
KIT TURNOVER KIT B (KITS) ×2 IMPLANT
KWIRE ACE 1.6X6 (WIRE) IMPLANT
MANIFOLD NEPTUNE II (INSTRUMENTS) ×2 IMPLANT
NEEDLE HYPO 22GX1.5 SAFETY (NEEDLE) IMPLANT
NS IRRIG 1000ML POUR BTL (IV SOLUTION) ×2 IMPLANT
PACK ORTHO EXTREMITY (CUSTOM PROCEDURE TRAY) ×2 IMPLANT
PAD ABD 8X10 STRL (GAUZE/BANDAGES/DRESSINGS) ×1 IMPLANT
PAD ARMBOARD 7.5X6 YLW CONV (MISCELLANEOUS) ×4 IMPLANT
PAD CAST 3X4 CTTN HI CHSV (CAST SUPPLIES) IMPLANT
PAD CAST 4YDX4 CTTN HI CHSV (CAST SUPPLIES) ×2 IMPLANT
PADDING CAST COTTON 3X4 STRL (CAST SUPPLIES) ×2
PADDING CAST COTTON 4X4 STRL (CAST SUPPLIES) ×2
PLATE ACE 100DEG 6HOLE (Plate) ×1 IMPLANT
PLATE LOCK LG 5H RT PROX TIB (Plate) ×1 IMPLANT
SCREW CORTICAL 3.5MM  16MM (Screw) ×3 IMPLANT
SCREW CORTICAL 3.5MM  44MM (Screw) ×1 IMPLANT
SCREW CORTICAL 3.5MM 16MM (Screw) IMPLANT
SCREW CORTICAL 3.5MM 24MM (Screw) ×1 IMPLANT
SCREW CORTICAL 3.5MM 40MM (Screw) ×1 IMPLANT
SCREW CORTICAL 3.5MM 44MM (Screw) IMPLANT
SCREW LOCK CORT STAR 3.5X38 (Screw) ×1 IMPLANT
SCREW LOCK CORT STAR 3.5X42 (Screw) ×1 IMPLANT
SCREW LOCK CORT STAR 3.5X65 (Screw) ×1 IMPLANT
SCREW LOCK CORT STAR 3.5X85 (Screw) ×2 IMPLANT
SCREW LOCK CORT STAR 3.5X90 (Screw) ×2 IMPLANT
SCREW LP 3.5X90MM (Screw) ×2 IMPLANT
SCREW NLOCK CANC HEX 4X16 (Screw) ×1 IMPLANT
SCREW NLOCK CANC HEX 4X18 (Screw) ×2 IMPLANT
SCREW NLOCK CANC HEX 4X18 FIB (Screw) IMPLANT
SPONGE LAP 4X18 RFD (DISPOSABLE) ×1 IMPLANT
SUCTION FRAZIER HANDLE 10FR (MISCELLANEOUS) ×1
SUCTION TUBE FRAZIER 10FR DISP (MISCELLANEOUS) ×1 IMPLANT
SUT FIBERWIRE 2-0 18 17.9 3/8 (SUTURE) ×6
SUT VIC AB 0 CT1 27 (SUTURE) ×2
SUT VIC AB 0 CT1 27XBRD ANBCTR (SUTURE) ×1 IMPLANT
SUT VIC AB 1 CT1 27 (SUTURE) ×2
SUT VIC AB 1 CT1 27XBRD ANBCTR (SUTURE) IMPLANT
SUT VIC AB 2-0 CT1 27 (SUTURE) ×4
SUT VIC AB 2-0 CT1 TAPERPNT 27 (SUTURE) IMPLANT
SUT VIC AB 3-0 CT1 27 (SUTURE) ×2
SUT VIC AB 3-0 CT1 36 (SUTURE) ×1 IMPLANT
SUT VIC AB 3-0 CT1 TAPERPNT 27 (SUTURE) IMPLANT
SUTURE FIBERWR 2-0 18 17.9 3/8 (SUTURE) IMPLANT
SYR CONTROL 10ML LL (SYRINGE) IMPLANT
TOWEL GREEN STERILE (TOWEL DISPOSABLE) ×2 IMPLANT
TOWEL GREEN STERILE FF (TOWEL DISPOSABLE) ×2 IMPLANT
TUBE CONNECTING 12X1/4 (SUCTIONS) ×2 IMPLANT
UNDERPAD 30X30 (UNDERPADS AND DIAPERS) ×2 IMPLANT
WATER STERILE IRR 1000ML POUR (IV SOLUTION) IMPLANT
YANKAUER SUCT BULB TIP NO VENT (SUCTIONS) ×1 IMPLANT

## 2019-03-07 NOTE — Anesthesia Postprocedure Evaluation (Signed)
Anesthesia Post Note  Patient: Eissa Rostad Weltz  Procedure(s) Performed: OPEN REDUCTION INTERNAL FIXATION (ORIF) FIBULA FRACTURE (Right Leg Lower) Open Reduction Internal Fixation (Orif) Tibial Plateau (Right Leg Lower)     Patient location during evaluation: PACU Anesthesia Type: General Level of consciousness: awake and alert Pain management: pain level controlled Vital Signs Assessment: post-procedure vital signs reviewed and stable Respiratory status: spontaneous breathing, nonlabored ventilation, respiratory function stable and patient connected to nasal cannula oxygen Cardiovascular status: blood pressure returned to baseline and stable Postop Assessment: no apparent nausea or vomiting Anesthetic complications: no    Last Vitals:  Vitals:   03/07/19 2051 03/07/19 2103  BP: (!) 153/71 (!) 176/74  Pulse: 85 93  Resp: 17 17  Temp: 36.7 C 37 C  SpO2: 100% 98%    Last Pain:  Vitals:   03/07/19 2051  TempSrc:   PainSc: [place] DAVID

## 2019-03-07 NOTE — Progress Notes (Signed)
PROGRESS NOTE  Alex Morrison [facility] [id number] DOB: 19-Oct-1970   PCP: Haywood Pao, MD  Patient is from: Home.  Independently ambulates at baseline.  DOA: 03/06/2019 LOS: 1  Brief Narrative / Interim history: 49 y.o. male with history of ESRD on HD MWF, DM-1, HTN and recent hospitalization at [facility] from 2/9-2/15 with Streptococcus mitis bacteremia presenting with mechanical fall from loading dock about 4 feet high and subsequent right tibial plateau fracture and right distal fibula fracture.  Orthopedic surgery consulted, and planning surgical management of right tibial plateau fracture.  Nephrology consulted for HD in case he happens to be here on Monday.  Subjective: Reports having a rough night due to pain which has not been well controlled with fentanyl and Tylenol.  Still with some numbness in his right lower extremity.  No other complaints.  Objective: Vitals:   03/06/19 2000 03/06/19 2028 03/07/19 0455 03/07/19 0828  BP: (!) 147/72 (!) 150/75 (!) 151/77 (!) 142/81  Pulse: 94 98 93 90  Resp:  18 18 18   Temp:  99.4 F (37.4 C) 98.2 F (36.8 C) 99.4 F (37.4 C)  TempSrc:  Oral Oral Oral  SpO2: 98% 100% 97% 100%  Weight:  93.5 kg    Height:  5\' 11"  (1.803 m)     No intake or output data in the 24 hours ending 03/07/19 1213 Filed Weights   03/06/19 2028  Weight: 93.5 kg    Examination:  GENERAL: No acute distress.  Appears well.  HEENT: MMM.  Vision and hearing grossly intact.  NECK: Supple.  No apparent JVD.  RESP:  No IWOB. Good air movement bilaterally. CVS:  RRR. Heart sounds normal.  aVF in LUE.  TTS over left chest. ABD/GI/GU: Bowel sounds present. Soft. Non tender.  MSK/EXT: RLE in knee immobilizer.  Neurovascular intact in his toes. SKIN: no apparent skin lesion or wound NEURO: Awake, alert and oriented appropriately.  No apparent focal neuro deficit. PSYCH: Calm. Normal affect.  Procedures:  None  Assessment & Plan: Mechanical fall from loading  dock about 4 feet high Comminuted, displaced and depressed fracture lateral tibial plateau associated with hemarthrosis-noted on x-ray and CT. Displaced fracture of the distal right fibula-noted on x-ray. -Plan for surgical intervention today -Change to IV Dilaudid for better pain control -SCD for VTE prophylaxis. -Patient has no cardiopulmonary risk factors  Uncontrolled DM-1: A1c 9.2%.  On Lantus 10 units nightly and SSI at home CBG (last 3)  Recent Labs    03/07/19 0622 03/07/19 0819 03/07/19 1116  GLUCAP 389* 285* 125*  -Increased SSI to thin -Added Levemir and NovoLog AC  ESRD on HD MWF/BMD:  full HD on 2/19.  No indication for urgent dialysis. -Nephrology notified.  Anemia of renal disease: Baseline Hgb 8-9> 8.9 (admit)> 7.5.  Anemia panel consistent with both IDA and ACD.  Denies melena or hematochezia. -Continue monitoring -May need ESA and iron infusion.  Essential hypertension: BP fairly controlled. -As needed labetalol while n.p.o. -Continue home amlodipine  Streptococcus mitis bacteremia-hospitalized for this at [facility] 2/9-2/15 and discharged on Ancef with HD that he completed on 2/19.  Bacteremia thought to be from HD cath.  HD cath replaced.  TTE reportedly negative for vegetation.                 DVT prophylaxis: SCD Code Status: Full code Family Communication: Patient and/or RN. Available if any question.  Discharge barrier: Waiting on surgical intervention Patient is from: Home Final disposition: Likely  home  Consultants: Orthopedic surgery   Microbiology summarized: COVID-19 negative.  Sch Meds:  Scheduled Meds: . amLODipine  10 mg Oral Daily  . chlorhexidine  60 mL Topical Once  . Chlorhexidine Gluconate Cloth  6 each Topical Daily  . docusate sodium  100 mg Oral BID  . insulin aspart  0-5 Units Subcutaneous QHS  . insulin aspart  0-9 Units Subcutaneous TID WC  . insulin aspart  3 Units Subcutaneous TID WC  .  insulin detemir  6 Units Subcutaneous BID  . povidone-iodine  2 application Topical Once  . sevelamer carbonate  2,400 mg Oral BID WC   Continuous Infusions: .  ceFAZolin (ANCEF) IV     PRN Meds:.acetaminophen, albuterol, bisacodyl, HYDROmorphone (DILAUDID) injection, labetalol, ondansetron **OR** ondansetron (ZOFRAN) IV, senna-docusate  Antimicrobials: Anti-infectives (From admission, onward)   Start     Dose/Rate Route Frequency Ordered Stop   03/07/19 0600  ceFAZolin (ANCEF) IVPB 2g/100 mL premix     2 g 200 mL/hr over 30 Minutes Intravenous To Short Stay 03/06/19 2023 03/08/19 0600       I have personally reviewed the following labs and images: CBC: Recent Labs  Lab 03/06/19 1800 03/07/19 0334  WBC 8.7 7.1  NEUTROABS 7.2  --   HGB 8.9* 7.5*  HCT 28.7* 23.9*  MCV 90.0 89.5  PLT 318 251   BMP &GFR Recent Labs  Lab 03/06/19 1800 03/07/19 0334  NA 136 134*  K 3.6 3.9  CL 95* 93*  CO2 29 29  GLUCOSE 218* 439*  BUN 17 22*  CREATININE 4.88* 5.72*  CALCIUM 8.2* 7.8*   Estimated Creatinine Clearance: 18.3 mL/min (A) (by C-G formula based on SCr of 5.72 mg/dL (H)). Liver & Pancreas: No results for input(s): AST, ALT, ALKPHOS, BILITOT, PROT, ALBUMIN in the last 168 hours. No results for input(s): LIPASE, AMYLASE in the last 168 hours. No results for input(s): AMMONIA in the last 168 hours. Diabetic: Recent Labs    03/06/19 1921  HGBA1C 9.2*   Recent Labs  Lab 03/06/19 2137 03/07/19 0622 03/07/19 0819 03/07/19 1116  GLUCAP 219* 389* 285* 125*   Cardiac Enzymes: No results for input(s): CKTOTAL, CKMB, CKMBINDEX, TROPONINI in the last 168 hours. No results for input(s): PROBNP in the last 8760 hours. Coagulation Profile: Recent Labs  Lab 03/06/19 1921  INR 1.0   Thyroid Function Tests: No results for input(s): TSH, T4TOTAL, FREET4, T3FREE, THYROIDAB in the last 72 hours. Lipid Profile: No results for input(s): CHOL, HDL, LDLCALC, TRIG, CHOLHDL,  LDLDIRECT in the last 72 hours. Anemia Panel: Recent Labs    03/06/19 1921  VITAMINB12 828  FOLATE 8.6  FERRITIN 547*  TIBC 190*  IRON 20*  RETICCTPCT 1.9   Urine analysis:    Component Value Date/Time   COLORURINE STRAW (A) 08/07/2018 2328   APPEARANCEUR CLEAR 08/07/2018 2328   LABSPEC 1.010 08/07/2018 2328   PHURINE 6.0 08/07/2018 2328   GLUCOSEU >=500 (A) 08/07/2018 2328   HGBUR SMALL (A) 08/07/2018 2328   BILIRUBINUR NEGATIVE 08/07/2018 2328   KETONESUR NEGATIVE 08/07/2018 2328   PROTEINUR 100 (A) 08/07/2018 2328   UROBILINOGEN 0.2 09/28/2013 1034   NITRITE NEGATIVE 08/07/2018 2328   LEUKOCYTESUR NEGATIVE 08/07/2018 2328   Sepsis Labs: Invalid input(s): PROCALCITONIN, [place]  Microbiology: Recent Results (from the past 240 hour(s))  SARS CORONAVIRUS 2 (TAT 6-24 HRS) Nasopharyngeal Nasopharyngeal Swab     Status: None   Collection Time: 03/06/19  4:39 PM   Specimen: Nasopharyngeal Swab  Result Value Ref Range Status   SARS Coronavirus 2 NEGATIVE NEGATIVE Final    Comment: (NOTE) SARS-CoV-2 target nucleic acids are NOT DETECTED. The SARS-CoV-2 RNA is generally detectable in upper and lower respiratory specimens during the acute phase of infection. Negative results do not preclude SARS-CoV-2 infection, do not rule out co-infections with other pathogens, and should not be used as the sole basis for treatment or other patient management decisions. Negative results must be combined with clinical observations, patient history, and epidemiological information. The expected result is Negative. Fact Sheet for Patients: SugarRoll.be Fact Sheet for Healthcare Providers: https://www.woods-mathews.com/ This test is not yet approved or cleared by the [place] FDA and  has been authorized for detection and/or diagnosis of SARS-CoV-2 by FDA under an Emergency Use Authorization (EUA). This EUA will remain  in effect (meaning  this test can be used) for the duration of the COVID-19 declaration under Section 56 4(b)(1) of the Act, 21 U.S.C. section 360bbb-3(b)(1), unless the authorization is terminated or revoked sooner. Performed at [facility], [place] [address] [place], [place] [address]   Surgical pcr screen     Status: None   Collection Time: 03/06/19 10:09 PM   Specimen: Nasal Mucosa; Nasal Swab  Result Value Ref Range Status   MRSA, PCR NEGATIVE NEGATIVE Final   Staphylococcus aureus NEGATIVE NEGATIVE Final    Comment: (NOTE) The Xpert SA Assay (FDA approved for NASAL specimens in patients 14 years of age and older), is one component of a comprehensive surveillance program. It is not intended to diagnose infection nor to guide or monitor treatment. Performed at [facility], [place] [address] [place], [place] [address]     Radiology Studies: DG Ankle Complete Right  Result Date: 03/06/2019 CLINICAL DATA:  Fall. EXAM: RIGHT ANKLE - COMPLETE 3+ VIEW COMPARISON:  No recent prior. FINDINGS: Soft tissue swelling over the lateral malleolus. Slightly displaced fracture noted of the distal right fibula. Medial malleolus appears intact. Peripheral vascular calcification. IMPRESSION: 1. Prominent soft tissue swelling over the lateral malleolus. Slightly displaced fracture of the distal right fibula noted. 2.  Peripheral vascular disease. Electronically Signed   By: Marcello Moores  Register   On: 03/06/2019 16:12   CT Knee Right Wo Contrast  Result Date: 03/06/2019 CLINICAL DATA:  The patient suffered a right tibial plateau fracture due to a fall off a wall today. Initial encounter. EXAM: CT OF THE RIGHT KNEE WITHOUT CONTRAST TECHNIQUE: Multidetector CT imaging of the right knee was performed according to the standard protocol. Multiplanar CT image reconstructions were also generated. COMPARISON:  Plain films right knee earlier today. FINDINGS: Bones/Joint/Cartilage As seen on the comparison plain films, the  patient has a comminuted, depressed lateral plateau fracture. The articular surface is divided into 3 main fragments. Depression is up to 1.1 cm. There is a floating fragment the periphery of the articular surface which measures up to 1.5 cm transverse at the articular surface by 3.6 cm craniocaudal by 5.4 cm AP. This fragment is laterally displaced approximately 1 cm. Fracture includes a nondisplaced component which extends through the medial tibial eminence and into the medial plateau. The fibula is intact.  No other fracture is identified. Ligaments Suboptimally assessed by CT. The cruciate and collateral ligaments appear intact. The lateral meniscus is avulsed from the tibia. Muscles and Tendons Intact. Soft tissues Large lipo atherosclerosis also noted.  Hemarthrosis. IMPRESSION: Comminuted and depressed lateral tibial plateau fracture most consistent with a Schatzker type 2 injury. As noted  above, nondisplaced fracture line extends into the medial tibial plateau. Electronically Signed   By: Inge Rise M.D.   On: 03/06/2019 17:55   DG Knee Complete 4 Views Right  Result Date: 03/06/2019 CLINICAL DATA:  Patient reports right anterior and lateral knee pains after approximate 4 ft fall off a loading dock today. Reports unable to bear weight on right knee. Denies any prior right knee injury or surgery. Hx of diabetes. EXAM: RIGHT KNEE - COMPLETE 4+ VIEW COMPARISON:  None. FINDINGS: Comminuted, mildly displaced and depressed fracture of the lateral tibial plateau. Maximum depression of a component of the fracture is 1.2 cm from the expected location of the articular surface. Fractures also mildly displaced laterally by approximately 1 cm. A component of the fracture extends to the medial margin of the tibial spine. No other fractures.  No dislocation. Fat fluid level reflecting a hemarthrosis distends the suprapatellar joint capsule. There are arterial atherosclerotic calcifications posteriorly, advanced  for age. IMPRESSION: 1. Comminuted, displaced and depressed fracture of the lateral tibial plateau associated with a hemarthrosis. No dislocation. Electronically Signed   By: Lajean Manes M.D.   On: 03/06/2019 15:37     Jencarlo Bonadonna T. [place]  If 7PM-7AM, please contact night-coverage [email or web address] Password [facility] 03/07/2019, 12:13 PM

## 2019-03-07 NOTE — Transfer of Care (Signed)
Immediate Anesthesia Transfer of Care Note  Patient: Alex Morrison  Procedure(s) Performed: OPEN REDUCTION INTERNAL FIXATION (ORIF) FIBULA FRACTURE (Right Leg Lower) Open Reduction Internal Fixation (Orif) Tibial Plateau (Right Leg Lower)  Patient Location: PACU  Anesthesia Type:General  Level of Consciousness: awake and alert   Airway & Oxygen Therapy: Patient Spontanous Breathing and Patient connected to nasal cannula oxygen  Post-op Assessment: Report given to RN and Post -op Vital signs reviewed and stable  Post vital signs: Reviewed and stable  Last Vitals:  Vitals Value Taken Time  BP 143/68 03/07/19 2017  Temp    Pulse 86 03/07/19 2020  Resp 17 03/07/19 2020  SpO2 100 % 03/07/19 2020  Vitals shown include unvalidated device data.  Last Pain:  Vitals:   03/07/19 1640  TempSrc:   PainSc: 3       Patients Stated Pain Goal: 3 ([id number])  Complications: No apparent anesthesia complications

## 2019-03-07 NOTE — Op Note (Signed)
03/06/2019 - 03/07/2019  PATIENT:  Alex Morrison    PRE-OPERATIVE DIAGNOSIS: Right distal fibula fracture and right tibial plateau fracture, bicondylar  POST-OPERATIVE DIAGNOSIS:  Same  PROCEDURE:    1.  Open reduction internal fixation right fibula 2.  Open reduction internal fixation right bicondylar tibial plateau fracture   SURGEON:  Johnny Bridge, MD  PHYSICIAN ASSISTANT: Merlene Pulling, PA-C, present and scrubbed throughout the case, critical for completion in a timely fashion, and for retraction, instrumentation, and closure.  ANESTHESIA:   General  ESTIMATED BLOOD LOSS: 150 mL  UNIQUE ASPECTS OF THE CASE: The fibula fracture was displaced, and shortened, with some angulation.  This lined up anatomically.  The tibia fracture had extreme comminution with a significantly depressed spine component, as well as a significant lateral articular piece.  There was a small crack that went medially.  Bone quality was mediocre.  PREOPERATIVE INDICATIONS:  Alex Morrison is a  49 y.o. male with a diagnosis of right tibia plateau fracture with an ipsilateral fibula fracture after falling off of the back of a truck approximately 4 feet who elected for surgical management to minimize the risk for malunion and nonunion and post-traumatic arthritis.  He had significant preoperative risk factors including being on hemodialysis, as well as having just been discharged from another hospital last week after having had sepsis.  The risks benefits and alternatives were discussed with the patient preoperatively including but not limited to the risks of infection, bleeding, nerve injury, cardiopulmonary complications, posttraumatic arthrosis, the need for revision surgery, the need for hardware removal, among others, and the patient was willing to proceed.  OPERATIVE IMPLANTS: Biomet / Zimmer 1/3 tubular plate, 6 holes, with a single interfragmentary lag screw with 2 distal cancellous screws and 3  proximal cortical screws.  I used a titanium proximal tibial locking plate, using the large band, because this accommodated his anatomy better than the small bend.  I used a total of 3 distal locking screws, 1 distal nonlocking screw, 2 proximal bicortical nonlocking screws, with an additional 3 proximal locking rafting screws.  OPERATIVE PROCEDURE: The patient was brought to the operating room and placed in the supine position. All bony prominences were padded. General anesthesia was administered.  I did place a Foley, and in fact he put out 400 cc of urine while under anesthesia.    The lower extremity was prepped and draped in the usual sterile fashion.  I did a prescrub as well.  The leg was elevated and exsanguinated and the tourniquet was inflated.  Total tourniquet time was 115 minutes at 300 mmHg.  Time out was performed.   Incision was made over the distal fibula and the fracture was exposed and reduced anatomically with a clamp. A lag screw was placed. I then applied a 1/3 tubular plate and secured it proximally and distally non-locking screws. Bone quality was mediocre. I used c-arm to confirm satisfactory reduction and fixation.   The syndesmosis was stressed using live fluoroscopy and found to be stable.   The wounds were irrigated, and closed with vicryl with routine closure for the skin.  I did augment the closure with staples given his diabetes.    I then turned my attention to the tibia.  Incision was made in a relatively straight line incision, exposing the tibial crest, as well as the IT band.  The IT band was incised in line with its fibers, and the fascia elevated off of the tibial crest, and I  released the anterior compartment.  I performed a submeniscal arthrotomy, and placed a total of 3 2-0 FiberWire sutures through the meniscus and capsular tissue.  The fracture site was visualized, booked open, hematoma evacuated, and I elevated the fracture fragments back to his close to  anatomic position as I could achieve.  I packed a significant amount of cancellous bone graft under plateau fracture.  I then closed the fracture site using a window of bone, having attempted elevation of both of the major separate articular segments, applied a clamp, followed by a K wire which I brought out the medial side in order to minimize its prominence where the plate was going to be.  I then selected the appropriate plate, secured it distally with a cortical screw, and then proximally with a cortical screw achieving optimal contour to the bone.  I placed 1 more cortical screw proximally, and then finished the proximal fixation using locking rafting screws.  I then finished the distal fixation with a nonlocking screw at the very bottom hole, and then used a single kickstand screw, as well as transverse locking screws, the first screw that I placed into the shaft did not have the best hold so I converted it to a locking screw.  Final C-arm pictures were taken, including 4 views of the proximal tibia, the spine segment may have been still slightly depressed, and there may have been some slight diastases still present at the anterior window, but I had compressed this is much as possible and elevated as much as I could.  Satisfied with the ultimate reconstruction, I had restored alignment, and general articular alignment given his severe comminution.  I irrigated his wounds copiously, repaired the fascia with #1 Vicryl followed by 2-0 for the subcutaneous tissue with staples for the skin.  His compartments felt soft at the completion of the case.  He will be nonweightbearing on the right lower extremity for period of 6 to 8 weeks, and he can be anticoagulated with which ever anticoagulant is most appropriate given his renal dysfunction.

## 2019-03-07 NOTE — Discharge Instructions (Signed)
Diet: As you were doing prior to hospitalization   Shower:  May shower but keep the wounds dry, use an occlusive plastic wrap, NO SOAKING IN TUB.  If the bandage gets wet, change with a clean dry gauze.  If you have a splint on, leave the splint in place and keep the splint dry with a plastic bag.  Dressing:  You may change your dressing 3-5 days after surgery, unless you have a splint.  If you have a splint, then just leave the splint in place and we will change your bandages during your first follow-up appointment.    If you had hand or foot surgery, we will plan to remove your stitches in about 2 weeks in the office.  For all other surgeries, there are sticky tapes (steri-strips) on your wounds and all the stitches are absorbable.  Leave the steri-strips in place when changing your dressings, they will peel off with time, usually 2-3 weeks.  Activity:  Increase activity slowly as tolerated, but follow the weight bearing instructions below.  The rules on driving is that you can not be taking narcotics while you drive, and you must feel in control of the vehicle.    Weight Bearing:   Non-weight bearing through right leg.  To prevent constipation: you may use a stool softener such as -  Colace (over the counter) 100 mg by mouth twice a day  Drink plenty of fluids (prune juice may be helpful) and high fiber foods Miralax (over the counter) for constipation as needed.    Itching:  If you experience itching with your medications, try taking only a single pain pill, or even half a pain pill at a time.  You may take up to 10 pain pills per day, and you can also use benadryl over the counter for itching or also to help with sleep.   Precautions:  If you experience chest pain or shortness of breath - call 911 immediately for transfer to the hospital emergency department!!  If you develop a fever greater that 101 F, purulent drainage from wound, increased redness or drainage from wound, or calf pain --  Call the office at [phone]                                                Follow- Up Appointment:  Please call for an appointment to be seen in 2 weeks [place] - [phone]

## 2019-03-07 NOTE — Anesthesia Procedure Notes (Signed)
Procedure Name: Intubation Date/Time: 03/07/2019 5:10 PM Performed by: Suzy Bouchard, CRNA Pre-anesthesia Checklist: Patient identified, Emergency Drugs available, Suction available, Patient being monitored and Timeout performed Patient Re-evaluated:Patient Re-evaluated prior to induction Oxygen Delivery Method: Circle system utilized Preoxygenation: Pre-oxygenation with 100% oxygen Induction Type: IV induction, Rapid sequence and Cricoid Pressure applied Laryngoscope Size: Miller and 2 Grade View: Grade II Tube type: Oral Tube size: 7.5 mm Number of attempts: 1 Airway Equipment and Method: Stylet Placement Confirmation: ETT inserted through vocal cords under direct vision,  positive ETCO2 and breath sounds checked- equal and bilateral Secured at: 23 cm Tube secured with: Tape Dental Injury: Teeth and Oropharynx as per pre-operative assessment

## 2019-03-07 NOTE — Progress Notes (Signed)
1430 Pt to short stay, report was given to [employer]l[place]. NPO post midnight maint. RLE splint dry and intact, toes are warm.

## 2019-03-07 NOTE — Anesthesia Preprocedure Evaluation (Addendum)
Anesthesia Evaluation  Patient identified by MRN, date of birth, ID band Patient awake    Reviewed: Allergy & Precautions, NPO status , Patient's Chart, lab work & pertinent test results  Airway Mallampati: I  TM Distance: >3 FB Neck ROM: Full    Dental  (+) Teeth Intact, Dental Advisory Given   Pulmonary    Pulmonary exam normal        Cardiovascular hypertension, Pt. on medications Normal cardiovascular exam     Neuro/Psych    GI/Hepatic   Endo/Other  diabetes, Type 2, Insulin Dependent  Renal/GU ESRF and DialysisRenal disease     Musculoskeletal   Abdominal   Peds  Hematology   Anesthesia Other Findings   Reproductive/Obstetrics                            Anesthesia Physical Anesthesia Plan  ASA: III  Anesthesia Plan: General   Post-op Pain Management:    Induction: Intravenous  PONV Risk Score and Plan: 2 and Ondansetron and Midazolam  Airway Management Planned: Oral ETT  Additional Equipment:   Intra-op Plan:   Post-operative Plan: Extubation in OR  Informed Consent: I have reviewed the patients History and Physical, chart, labs and discussed the procedure including the risks, benefits and alternatives for the proposed anesthesia with the patient or authorized representative who has indicated his/her understanding and acceptance.       Plan Discussed with: CRNA and Surgeon  Anesthesia Plan Comments:         Anesthesia Quick Evaluation

## 2019-03-08 DIAGNOSIS — S82141A Displaced bicondylar fracture of right tibia, initial encounter for closed fracture: Secondary | ICD-10-CM | POA: Diagnosis not present

## 2019-03-08 DIAGNOSIS — S82831A Other fracture of upper and lower end of right fibula, initial encounter for closed fracture: Secondary | ICD-10-CM | POA: Diagnosis not present

## 2019-03-08 DIAGNOSIS — E109 Type 1 diabetes mellitus without complications: Secondary | ICD-10-CM | POA: Diagnosis not present

## 2019-03-08 DIAGNOSIS — I120 Hypertensive chronic kidney disease with stage 5 chronic kidney disease or end stage renal disease: Secondary | ICD-10-CM | POA: Diagnosis not present

## 2019-03-08 DIAGNOSIS — N186 End stage renal disease: Secondary | ICD-10-CM | POA: Diagnosis not present

## 2019-03-08 DIAGNOSIS — N189 Chronic kidney disease, unspecified: Secondary | ICD-10-CM | POA: Diagnosis not present

## 2019-03-08 DIAGNOSIS — Z992 Dependence on renal dialysis: Secondary | ICD-10-CM | POA: Diagnosis not present

## 2019-03-08 DIAGNOSIS — W19XXXA Unspecified fall, initial encounter: Secondary | ICD-10-CM | POA: Diagnosis not present

## 2019-03-08 LAB — CBC
HCT: 23.5 % — ABNORMAL LOW (ref 39.0–52.0)
Hemoglobin: 7.3 g/dL — ABNORMAL LOW (ref 13.0–17.0)
MCH: 27.9 pg (ref 26.0–34.0)
MCHC: 31.1 g/dL (ref 30.0–36.0)
MCV: 89.7 fL (ref 80.0–100.0)
Platelets: 221 10*3/uL (ref 150–400)
RBC: 2.62 MIL/uL — ABNORMAL LOW (ref 4.22–5.81)
RDW: 15 % (ref 11.5–15.5)
WBC: 9.2 10*3/uL (ref 4.0–10.5)
nRBC: 0 % (ref 0.0–0.2)

## 2019-03-08 LAB — GLUCOSE, CAPILLARY
Glucose-Capillary: 200 mg/dL — ABNORMAL HIGH (ref 70–99)
Glucose-Capillary: 154 mg/dL — ABNORMAL HIGH (ref 70–99)
Glucose-Capillary: 198 mg/dL — ABNORMAL HIGH (ref 70–99)
Glucose-Capillary: 179 mg/dL — ABNORMAL HIGH (ref 70–99)

## 2019-03-08 LAB — RENAL FUNCTION PANEL
Albumin: 2.1 g/dL — ABNORMAL LOW (ref 3.5–5.0)
Anion gap: 11 (ref 5–15)
BUN: 31 mg/dL — ABNORMAL HIGH (ref 6–20)
CO2: 28 mmol/L (ref 22–32)
Calcium: 7.8 mg/dL — ABNORMAL LOW (ref 8.9–10.3)
Chloride: 98 mmol/L (ref 98–111)
Creatinine, Ser: 7.51 mg/dL — ABNORMAL HIGH (ref 0.61–1.24)
GFR calc Af Amer: 9 mL/min — ABNORMAL LOW (ref >60)
GFR calc non Af Amer: 8 mL/min — ABNORMAL LOW (ref >60)
Glucose, Bld: 219 mg/dL — ABNORMAL HIGH (ref 70–99)
Phosphorus: 5.8 mg/dL — ABNORMAL HIGH (ref 2.5–4.6)
Potassium: 4.3 mmol/L (ref 3.5–5.1)
Sodium: 137 mmol/L (ref 135–145)

## 2019-03-08 LAB — MAGNESIUM: Magnesium: 2 mg/dL (ref 1.7–2.4)

## 2019-03-08 MED ORDER — PRO-STAT SUGAR FREE PO LIQD
30.0000 mL | Freq: Two times a day (BID) | ORAL | Status: DC
  Administered 2019-03-08 – 2019-03-12 (×7): 30 mL via ORAL
  Filled 2019-03-08 (×7): qty 30

## 2019-03-08 MED ORDER — INSULIN ASPART 100 UNIT/ML ~~LOC~~ SOLN
4.0000 [IU] | Freq: Three times a day (TID) | SUBCUTANEOUS | Status: DC
  Administered 2019-03-08: 3 [IU] via SUBCUTANEOUS
  Administered 2019-03-10 (×2): 4 [IU] via SUBCUTANEOUS

## 2019-03-08 MED ORDER — RENA-VITE PO TABS
1.0000 | ORAL_TABLET | Freq: Every day | ORAL | Status: DC
  Administered 2019-03-08 – 2019-03-12 (×5): 1 via ORAL
  Filled 2019-03-08 (×5): qty 1

## 2019-03-08 MED ORDER — INSULIN DETEMIR 100 UNIT/ML ~~LOC~~ SOLN
7.0000 [IU] | Freq: Two times a day (BID) | SUBCUTANEOUS | Status: DC
  Administered 2019-03-08 – 2019-03-09 (×2): 7 [IU] via SUBCUTANEOUS
  Filled 2019-03-08 (×5): qty 0.07

## 2019-03-08 MED ORDER — CHLORHEXIDINE GLUCONATE CLOTH 2 % EX PADS
6.0000 | MEDICATED_PAD | Freq: Every day | CUTANEOUS | Status: DC
  Administered 2019-03-10: 09:00:00 6 via TOPICAL

## 2019-03-08 MED ORDER — NEPRO/CARBSTEADY PO LIQD
237.0000 mL | Freq: Two times a day (BID) | ORAL | Status: DC
  Administered 2019-03-11 – 2019-03-12 (×2): 237 mL via ORAL

## 2019-03-08 MED ORDER — DOXERCALCIFEROL 4 MCG/2ML IV SOLN
4.0000 ug | INTRAVENOUS | Status: DC
  Administered 2019-03-11: 4 ug via INTRAVENOUS
  Filled 2019-03-08 (×2): qty 2

## 2019-03-08 NOTE — Plan of Care (Signed)
  Problem: Pain Managment: Goal: General experience of comfort will improve Outcome: Progressing   Problem: Safety: Goal: Ability to remain free from injury will improve Outcome: Progressing   Problem: Skin Integrity: Goal: Risk for impaired skin integrity will decrease Outcome: Progressing   

## 2019-03-08 NOTE — Progress Notes (Signed)
PROGRESS NOTE  Alex Morrison [facility] [id number] DOB: 1970/05/18   PCP: Haywood Pao, MD  Patient is from: Home.  Independently ambulates at baseline.  DOA: 03/06/2019 LOS: 2  Brief Narrative / Interim history: 49 y.o. male with history of ESRD on HD MWF, DM-1, HTN and recent hospitalization at [facility] from 2/9-2/15 with Streptococcus mitis bacteremia presenting with mechanical fall from loading dock about 4 feet high and subsequent right tibial plateau fracture and right distal fibula fracture.  Orthopedic surgery consulted, and planning surgical management of right tibial plateau fracture.  Nephrology consulted for HD in case he happens to be here on Monday. Patient underwent ORIF of right tibial plateau fracture and right distal fibula fracture on 03/06/2018 by Dr. Mardelle Matte.  Subjective: Did not sleep well last night due to significant pain.  Pain improved after adjustment to his pain medications.  No other complaints.  He denies chest pain, dyspnea or GI symptoms.  He denies melena or hematochezia.  LBM about 5 days ago. He is very hesitant about blood transfusion. Objective: Vitals:   03/07/19 2329 03/08/19 0255 03/08/19 0732 03/08/19 0800  BP: (!) 154/77 (!) 152/82 (!) 151/78   Pulse: 89 90 88   Resp: 16 16 18    Temp: 99.2 F (37.3 C) 99 F (37.2 C) 98.3 F (36.8 C)   TempSrc: Oral Oral Oral   SpO2: 100% 100% 100% 98%  Weight:      Height:        Intake/Output Summary (Last 24 hours) at 03/08/2019 1133 Last data filed at 03/08/2019 1001 Gross per 24 hour  Intake 400 ml  Output 750 ml  Net -350 ml   Filed Weights   03/06/19 2028  Weight: 93.5 kg    Examination:  GENERAL: No acute distress.  Appears well.  HEENT: MMM.  Vision and hearing grossly intact.  NECK: Supple.  No apparent JVD.  RESP:  No IWOB. Good air movement bilaterally. CVS:  RRR. Heart sounds normal.  aVF in LUE.  TTS over left chest. ABD/GI/GU: Bowel sounds present. Soft. Non tender.  MSK/EXT: RLE in  knee immobilizer, dressing and Ace wrap.  Neurovascular intact in his toes. SKIN: no apparent skin lesion or wound NEURO: Awake, alert and oriented appropriately.  No apparent focal neuro deficit. PSYCH: Calm. Normal affect.  Procedures:  None  Assessment & Plan: Mechanical fall from loading dock about 4 feet high Comminuted, displaced and depressed fracture lateral tibial plateau associated with hemarthrosis-noted on x-ray and CT. Displaced fracture of the distal right fibula-noted on x-ray. -ORIF of right tibial plateau fracture and right distal fibula fracture on 03/07/2019 by Dr. Mardelle Matte. -Pain control per orthopedic surgeon. -Subcu heparin for VTE prophylaxis-ESRD patient -Per Ortho, NWB on RLE for 6 to 8 weeks and outpt follow up in 2 weeks -PT/OT eval  Uncontrolled DM-1: A1c 9.2%.  On Lantus 10 units nightly and SSI at home CBG (last 3)  Recent Labs    03/07/19 1821 03/07/19 2018 03/08/19 0730  GLUCAP 169* 163* 200*  -Continue SSI-thin,  -Increase Levemir 6 to 7 units and NovoLog AC 3 to 4 units.  -Further adjustment as appropriate. -Start low-dose statin  ESRD on HD MWF/BMD:  full HD on 2/19.  No indication for urgent dialysis. -Nephrology notified.  Anemia of renal disease: Baseline Hgb 8-9> 8.9 (admit)> 7.5> 7.1.  Anemia panel consistent with both IDA and ACD.  Denies melena or hematochezia.  Hesitant about blood transfusion. -Continue monitoring -May need ESA and iron infusion.  Essential hypertension: BP fairly controlled. -Continue home amlodipine and as needed labetalol  Streptococcus mitis bacteremia-hospitalized for this at [facility] 2/9-2/15 and discharged on Ancef with HD that he completed on 2/19.  Bacteremia thought to be from HD cath.  HD cath replaced.  TTE reportedly negative for vegetation.  Constipation -Continue MiraLAX and Senokot               DVT prophylaxis: Subcu heparin Code Status: Full code Family Communication:  Patient and/or RN. Available if any question.  Discharge barrier: Therapy evaluation Patient is from: Home Final disposition: Likely home after therapy evaluation and recommendations.  Consultants: Orthopedic surgery   Microbiology summarized: [id number] negative.  Sch Meds:  Scheduled Meds: . acetaminophen  1,000 mg Oral Q6H  . amLODipine  10 mg Oral Daily  . docusate sodium  100 mg Oral BID  . heparin  5,000 Units Subcutaneous Q8H  . insulin aspart  0-5 Units Subcutaneous QHS  . insulin aspart  0-9 Units Subcutaneous TID WC  . insulin aspart  4 Units Subcutaneous TID WC  . insulin detemir  7 Units Subcutaneous BID  . senna  1 tablet Oral BID  . sevelamer carbonate  2,400 mg Oral BID WC   Continuous Infusions: . methocarbamol (ROBAXIN) IV     PRN Meds:.albuterol, bisacodyl, diphenhydrAMINE, HYDROmorphone (DILAUDID) injection, labetalol, magnesium citrate, methocarbamol **OR** methocarbamol (ROBAXIN) IV, metoCLOPramide **OR** metoCLOPramide (REGLAN) injection, ondansetron **OR** ondansetron (ZOFRAN) IV, oxyCODONE, oxyCODONE, polyethylene glycol, zolpidem  Antimicrobials: Anti-infectives (From admission, onward)   Start     Dose/Rate Route Frequency Ordered Stop   03/07/19 2300  ceFAZolin (ANCEF) IVPB 2g/100 mL premix     2 g 200 mL/hr over 30 Minutes Intravenous Every 6 hours 03/07/19 2113 03/08/19 1034   03/07/19 0600  ceFAZolin (ANCEF) IVPB 2g/100 mL premix  Status:  Discontinued     2 g 200 mL/hr over 30 Minutes Intravenous To Short Stay 03/06/19 2023 03/07/19 2110       I have personally reviewed the following labs and images: CBC: Recent Labs  Lab 03/06/19 1800 03/07/19 0334 03/08/19 0412  WBC 8.7 7.1 9.2  NEUTROABS 7.2  --   --   HGB 8.9* 7.5* 7.3*  HCT 28.7* 23.9* 23.5*  MCV 90.0 89.5 89.7  PLT 318 251 221   BMP &GFR Recent Labs  Lab 03/06/19 1800 03/07/19 0334 03/08/19 0412  NA 136 134* 137  K 3.6 3.9 4.3  CL 95* 93* 98  CO2 29 29 28   GLUCOSE  218* 439* 219*  BUN 17 22* 31*  CREATININE 4.88* 5.72* 7.51*  CALCIUM 8.2* 7.8* 7.8*  MG  --   --  2.0  PHOS  --   --  5.8*   Estimated Creatinine Clearance: 13.9 mL/min (A) (by C-G formula based on SCr of 7.51 mg/dL (H)). Liver & Pancreas: Recent Labs  Lab 03/08/19 0412  ALBUMIN 2.1*   No results for input(s): LIPASE, AMYLASE in the last 168 hours. No results for input(s): AMMONIA in the last 168 hours. Diabetic: Recent Labs    03/06/19 1921  HGBA1C 9.2*   Recent Labs  Lab 03/07/19 1421 03/07/19 1645 03/07/19 1821 03/07/19 2018 03/08/19 0730  GLUCAP 155* 165* 169* 163* 200*   Cardiac Enzymes: No results for input(s): CKTOTAL, CKMB, CKMBINDEX, TROPONINI in the last 168 hours. No results for input(s): PROBNP in the last 8760 hours. Coagulation Profile: Recent Labs  Lab 03/06/19 1921  INR 1.0   Thyroid Function Tests: No  results for input(s): TSH, T4TOTAL, FREET4, T3FREE, THYROIDAB in the last 72 hours. Lipid Profile: No results for input(s): CHOL, HDL, LDLCALC, TRIG, CHOLHDL, LDLDIRECT in the last 72 hours. Anemia Panel: Recent Labs    03/06/19 1921  VITAMINB12 828  FOLATE 8.6  FERRITIN 547*  TIBC 190*  IRON 20*  RETICCTPCT 1.9   Urine analysis:    Component Value Date/Time   COLORURINE STRAW (A) 08/07/2018 2328   APPEARANCEUR CLEAR 08/07/2018 2328   LABSPEC 1.010 08/07/2018 2328   PHURINE 6.0 08/07/2018 2328   GLUCOSEU >=500 (A) 08/07/2018 2328   HGBUR SMALL (A) 08/07/2018 2328   BILIRUBINUR NEGATIVE 08/07/2018 2328   KETONESUR NEGATIVE 08/07/2018 2328   PROTEINUR 100 (A) 08/07/2018 2328   UROBILINOGEN 0.2 09/28/2013 1034   NITRITE NEGATIVE 08/07/2018 2328   LEUKOCYTESUR NEGATIVE 08/07/2018 2328   Sepsis Labs: Invalid input(s): PROCALCITONIN, Ida  Microbiology: Recent Results (from the past 240 hour(s))  SARS CORONAVIRUS 2 (TAT 6-24 HRS) Nasopharyngeal Nasopharyngeal Swab     Status: None   Collection Time: 03/06/19  4:39 PM    Specimen: Nasopharyngeal Swab  Result Value Ref Range Status   SARS Coronavirus 2 NEGATIVE NEGATIVE Final    Comment: (NOTE) SARS-CoV-2 target nucleic acids are NOT DETECTED. The SARS-CoV-2 RNA is generally detectable in upper and lower respiratory specimens during the acute phase of infection. Negative results do not preclude SARS-CoV-2 infection, do not rule out co-infections with other pathogens, and should not be used as the sole basis for treatment or other patient management decisions. Negative results must be combined with clinical observations, patient history, and epidemiological information. The expected result is Negative. Fact Sheet for Patients: SugarRoll.be Fact Sheet for Healthcare Providers: https://www.woods-mathews.com/ This test is not yet approved or cleared by the [place] FDA and  has been authorized for detection and/or diagnosis of SARS-CoV-2 by FDA under an Emergency Use Authorization (EUA). This EUA will remain  in effect (meaning this test can be used) for the duration of the COVID-19 declaration under Section 56 4(b)(1) of the Act, 21 U.S.C. section 360bbb-3(b)(1), unless the authorization is terminated or revoked sooner. Performed at [facility], [place] [address] [place], [place] [address]   Surgical pcr screen     Status: None   Collection Time: 03/06/19 10:09 PM   Specimen: Nasal Mucosa; Nasal Swab  Result Value Ref Range Status   MRSA, PCR NEGATIVE NEGATIVE Final   Staphylococcus aureus NEGATIVE NEGATIVE Final    Comment: (NOTE) The Xpert SA Assay (FDA approved for NASAL specimens in patients 57 years of age and older), is one component of a comprehensive surveillance program. It is not intended to diagnose infection nor to guide or monitor treatment. Performed at [facility], [place] [address], [place], [place] [address]     Radiology Studies: DG Tibia/Fibula Right  Result Date:  03/07/2019 CLINICAL DATA:  Fractures of the tibial to plateau and lateral malleolus EXAM: RIGHT TIBIA AND FIBULA - 2 VIEW COMPARISON:  CT knee and ankle radiographs dated 03/06/2019 FINDINGS: Intraoperative fluoroscopy images demonstrate open reduction internal fixation with plate and screws for a lateral tibial plateau fracture which is in improved alignment. The patient is also status post open reduction internal fixation with plate and screws for a lateral malleolus fracture which is in improved alignment. There is no evidence of hardware failure. IMPRESSION: Status post open reduction internal fixation of lateral tibial plateau and lateral malleolus fractures. No evidence of hardware failure. Electronically Signed  By: Zerita Boers M.D.   On: 03/07/2019 19:57   DG Ankle Complete Right  Result Date: 03/07/2019 CLINICAL DATA:  Postop. EXAM: RIGHT ANKLE - COMPLETE 3+ VIEW COMPARISON:  03/06/2019 FINDINGS: The patient is now status post plate screw fixation of the distal fibula. Overlying skin staples are noted. There is surrounding soft tissue swelling. The osseous alignment is improved. There is an overlying plaster splint that obscures bony details. IMPRESSION: Status post plate and screw fixation of the distal fibula with improved osseous alignment. Electronically Signed   By: Constance Holster M.D.   On: 03/07/2019 22:24   DG Knee Complete 4 Views Right  Result Date: 03/07/2019 CLINICAL DATA:  Postop. EXAM: RIGHT KNEE - COMPLETE 4+ VIEW COMPARISON:  03/06/2019 FINDINGS: The patient is now status post plate screw fixation of the proximal tibia. The alignment is improved. Overlying skin staples are noted. There is overlying soft tissue swelling. There is no evidence for hardware fracture or failure. IMPRESSION: Status post plate and screw fixation of the proximal tibia with improved alignment. Electronically Signed   By: Constance Holster M.D.   On: 03/07/2019 22:25   DG C-Arm 1-60 Min  Result  Date: 03/07/2019 CLINICAL DATA:  Status post fixation of lateral malleolus and lateral tibial plateau fractures. EXAM: DG C-ARM 1-60 MIN CONTRAST:  None FLUOROSCOPY TIME:  Fluoroscopy Time:  1 minutes 28 seconds Radiation Exposure Index (if provided by the fluoroscopic device): Not applicable Number of Acquired Spot Images: 0 COMPARISON:  CT knee and ankle radiographs dated 03/06/2019 FINDINGS: Intraoperative fluoroscopy images demonstrate open reduction internal fixation with plate and screws for a lateral tibial plateau fracture which is in improved alignment. The patient is also status post open reduction internal fixation with plate and screws for a lateral malleolus fracture which is in improved alignment. There is no evidence of hardware failure. IMPRESSION: Status post open reduction internal fixation of lateral tibial plateau and lateral malleolus fractures. Electronically Signed   By: Zerita Boers M.D.   On: 03/07/2019 19:58     Enzo Treu T. Frederick  If 7PM-7AM, please contact night-coverage [email or web address] Password [facility] 03/08/2019, 11:33 AM

## 2019-03-08 NOTE — Progress Notes (Signed)
Subjective: 1 Day Post-Op s/p Procedure(s): OPEN REDUCTION INTERNAL FIXATION (ORIF) FIBULA FRACTURE Open Reduction Internal Fixation (Orif) Tibial Plateau   Patient is alert, oriented, laying in bed. Patient states pain has been moderate and was worse overnight. Denies chest pain, SOB, Calf pain. No nausea/vomiting.   Objective:  PE: VITALS:   Vitals:   03/07/19 2329 03/08/19 0255 03/08/19 0732 03/08/19 0800  BP: (!) 154/77 (!) 152/82 (!) 151/78   Pulse: 89 90 88   Resp: 16 16 18    Temp: 99.2 F (37.3 C) 99 F (37.2 C) 98.3 F (36.8 C)   TempSrc: Oral Oral Oral   SpO2: 100% 100% 100% 98%  Weight:      Height:       General: Patient laying in bed, in no acute distress. RLE: posterior splint and knee immobilizer intact. Dressings in place with mild strike through. Able to move all toes of right foot. Right foot warm and well perfused. Distal sensation intact. Compartments soft, compressible.    LABS  Results for orders placed or performed during the hospital encounter of 03/06/19 (from the past 24 hour(s))  Glucose, capillary     Status: Abnormal   Collection Time: 03/07/19 11:16 AM  Result Value Ref Range   Glucose-Capillary 125 (H) 70 - 99 mg/dL  Glucose, capillary     Status: Abnormal   Collection Time: 03/07/19  2:21 PM  Result Value Ref Range   Glucose-Capillary 155 (H) 70 - 99 mg/dL  Type and screen [place]     Status: None   Collection Time: 03/07/19  4:45 PM  Result Value Ref Range   ABO/RH(D) O POS    Antibody Screen NEG    Sample Expiration      03/10/2019,2359 Performed at [facility], [place] [address] [place], [place] [address]   Glucose, capillary     Status: Abnormal   Collection Time: 03/07/19  4:45 PM  Result Value Ref Range   Glucose-Capillary 165 (H) 70 - 99 mg/dL  Glucose, capillary     Status: Abnormal   Collection Time: 03/07/19  6:21 PM  Result Value Ref Range   Glucose-Capillary 169 (H) 70 - 99 mg/dL    Glucose, capillary     Status: Abnormal   Collection Time: 03/07/19  8:18 PM  Result Value Ref Range   Glucose-Capillary 163 (H) 70 - 99 mg/dL  Renal function panel     Status: Abnormal   Collection Time: 03/08/19  4:12 AM  Result Value Ref Range   Sodium 137 135 - 145 mmol/L   Potassium 4.3 3.5 - 5.1 mmol/L   Chloride 98 98 - 111 mmol/L   CO2 28 22 - 32 mmol/L   Glucose, Bld 219 (H) 70 - 99 mg/dL   BUN 31 (H) 6 - 20 mg/dL   Creatinine, Ser 7.51 (H) 0.61 - 1.24 mg/dL   Calcium 7.8 (L) 8.9 - 10.3 mg/dL   Phosphorus 5.8 (H) 2.5 - 4.6 mg/dL   Albumin 2.1 (L) 3.5 - 5.0 g/dL   GFR calc non Af Amer 8 (L) >60 mL/min   GFR calc Af Amer 9 (L) >60 mL/min   Anion gap 11 5 - 15  CBC     Status: Abnormal   Collection Time: 03/08/19  4:12 AM  Result Value Ref Range   WBC 9.2 4.0 - 10.5 K/uL   RBC 2.62 (L) 4.22 - 5.81 MIL/uL   Hemoglobin 7.3 (L) 13.0 -  17.0 g/dL   HCT 23.5 (L) 39.0 - 52.0 %   MCV 89.7 80.0 - 100.0 fL   MCH 27.9 26.0 - 34.0 pg   MCHC 31.1 30.0 - 36.0 g/dL   RDW 15.0 11.5 - 15.5 %   Platelets 221 150 - 400 K/uL   nRBC 0.0 0.0 - 0.2 %  Magnesium     Status: None   Collection Time: 03/08/19  4:12 AM  Result Value Ref Range   Magnesium 2.0 1.7 - 2.4 mg/dL  Glucose, capillary     Status: Abnormal   Collection Time: 03/08/19  7:30 AM  Result Value Ref Range   Glucose-Capillary 200 (H) 70 - 99 mg/dL    DG Tibia/Fibula Right  Result Date: 03/07/2019 CLINICAL DATA:  Fractures of the tibial to plateau and lateral malleolus EXAM: RIGHT TIBIA AND FIBULA - 2 VIEW COMPARISON:  CT knee and ankle radiographs dated 03/06/2019 FINDINGS: Intraoperative fluoroscopy images demonstrate open reduction internal fixation with plate and screws for a lateral tibial plateau fracture which is in improved alignment. The patient is also status post open reduction internal fixation with plate and screws for a lateral malleolus fracture which is in improved alignment. There is no evidence of hardware  failure. IMPRESSION: Status post open reduction internal fixation of lateral tibial plateau and lateral malleolus fractures. No evidence of hardware failure. Electronically Signed   By: Zerita Boers M.D.   On: 03/07/2019 19:57   DG Ankle Complete Right  Result Date: 03/07/2019 CLINICAL DATA:  Postop. EXAM: RIGHT ANKLE - COMPLETE 3+ VIEW COMPARISON:  03/06/2019 FINDINGS: The patient is now status post plate screw fixation of the distal fibula. Overlying skin staples are noted. There is surrounding soft tissue swelling. The osseous alignment is improved. There is an overlying plaster splint that obscures bony details. IMPRESSION: Status post plate and screw fixation of the distal fibula with improved osseous alignment. Electronically Signed   By: Constance Holster M.D.   On: 03/07/2019 22:24   DG Ankle Complete Right  Result Date: 03/06/2019 CLINICAL DATA:  Fall. EXAM: RIGHT ANKLE - COMPLETE 3+ VIEW COMPARISON:  No recent prior. FINDINGS: Soft tissue swelling over the lateral malleolus. Slightly displaced fracture noted of the distal right fibula. Medial malleolus appears intact. Peripheral vascular calcification. IMPRESSION: 1. Prominent soft tissue swelling over the lateral malleolus. Slightly displaced fracture of the distal right fibula noted. 2.  Peripheral vascular disease. Electronically Signed   By: Marcello Moores  Register   On: 03/06/2019 16:12   CT Knee Right Wo Contrast  Result Date: 03/06/2019 CLINICAL DATA:  The patient suffered a right tibial plateau fracture due to a fall off a wall today. Initial encounter. EXAM: CT OF THE RIGHT KNEE WITHOUT CONTRAST TECHNIQUE: Multidetector CT imaging of the right knee was performed according to the standard protocol. Multiplanar CT image reconstructions were also generated. COMPARISON:  Plain films right knee earlier today. FINDINGS: Bones/Joint/Cartilage As seen on the comparison plain films, the patient has a comminuted, depressed lateral plateau fracture.  The articular surface is divided into 3 main fragments. Depression is up to 1.1 cm. There is a floating fragment the periphery of the articular surface which measures up to 1.5 cm transverse at the articular surface by 3.6 cm craniocaudal by 5.4 cm AP. This fragment is laterally displaced approximately 1 cm. Fracture includes a nondisplaced component which extends through the medial tibial eminence and into the medial plateau. The fibula is intact.  No other fracture is identified. Ligaments Suboptimally assessed  by CT. The cruciate and collateral ligaments appear intact. The lateral meniscus is avulsed from the tibia. Muscles and Tendons Intact. Soft tissues Large lipo atherosclerosis also noted.  Hemarthrosis. IMPRESSION: Comminuted and depressed lateral tibial plateau fracture most consistent with a Schatzker type 2 injury. As noted above, nondisplaced fracture line extends into the medial tibial plateau. Electronically Signed   By: Inge Rise M.D.   On: 03/06/2019 17:55   DG Knee Complete 4 Views Right  Result Date: 03/07/2019 CLINICAL DATA:  Postop. EXAM: RIGHT KNEE - COMPLETE 4+ VIEW COMPARISON:  03/06/2019 FINDINGS: The patient is now status post plate screw fixation of the proximal tibia. The alignment is improved. Overlying skin staples are noted. There is overlying soft tissue swelling. There is no evidence for hardware fracture or failure. IMPRESSION: Status post plate and screw fixation of the proximal tibia with improved alignment. Electronically Signed   By: Constance Holster M.D.   On: 03/07/2019 22:25   DG Knee Complete 4 Views Right  Result Date: 03/06/2019 CLINICAL DATA:  Patient reports right anterior and lateral knee pains after approximate 4 ft fall off a loading dock today. Reports unable to bear weight on right knee. Denies any prior right knee injury or surgery. Hx of diabetes. EXAM: RIGHT KNEE - COMPLETE 4+ VIEW COMPARISON:  None. FINDINGS: Comminuted, mildly displaced and  depressed fracture of the lateral tibial plateau. Maximum depression of a component of the fracture is 1.2 cm from the expected location of the articular surface. Fractures also mildly displaced laterally by approximately 1 cm. A component of the fracture extends to the medial margin of the tibial spine. No other fractures.  No dislocation. Fat fluid level reflecting a hemarthrosis distends the suprapatellar joint capsule. There are arterial atherosclerotic calcifications posteriorly, advanced for age. IMPRESSION: 1. Comminuted, displaced and depressed fracture of the lateral tibial plateau associated with a hemarthrosis. No dislocation. Electronically Signed   By: Lajean Manes M.D.   On: 03/06/2019 15:37   DG C-Arm 1-60 Min  Result Date: 03/07/2019 CLINICAL DATA:  Status post fixation of lateral malleolus and lateral tibial plateau fractures. EXAM: DG C-ARM 1-60 MIN CONTRAST:  None FLUOROSCOPY TIME:  Fluoroscopy Time:  1 minutes 28 seconds Radiation Exposure Index (if provided by the fluoroscopic device): Not applicable Number of Acquired Spot Images: 0 COMPARISON:  CT knee and ankle radiographs dated 03/06/2019 FINDINGS: Intraoperative fluoroscopy images demonstrate open reduction internal fixation with plate and screws for a lateral tibial plateau fracture which is in improved alignment. The patient is also status post open reduction internal fixation with plate and screws for a lateral malleolus fracture which is in improved alignment. There is no evidence of hardware failure. IMPRESSION: Status post open reduction internal fixation of lateral tibial plateau and lateral malleolus fractures. Electronically Signed   By: Zerita Boers M.D.   On: 03/07/2019 19:58    Assessment/Plan: Active Problems:   Tibial plateau fracture End Stage Renal Disease   1 Day Post-Op s/p Procedure(s): OPEN REDUCTION INTERNAL FIXATION (ORIF) FIBULA FRACTURE Open Reduction Internal Fixation (Orif) Tibial  Plateau  Weightbearing: Non-weight bearing in RLE for 6-8 weeks post-operatively. Therapy eval consult placed. Insicional and dressing care: PRN dressing changes Orthopedic device(s): Continue posterior splint and knee immobilizer. Ace-wrap in place to help with swelling. VTE prophylaxis: heparin chosen post-operative, appreciate hospitalist service input on other options for DVT prophylaxis given his ESRD Pain control: continue current prn pain control regimen Follow - up plan: Plan to follow-up with Dr. Mardelle Matte  2 weeks after discharge. TOC consult placed. Contact information:   Weekdays [address], [place] [phone] A fter hours and holidays please check [email or web address] for group call information for [facility]  Ventura Bruns 03/08/2019, 8:51 AM

## 2019-03-08 NOTE — Progress Notes (Signed)
A&O x4, RLE with ace wrap dry and intact with right knee immobilizer on. Encouraged pt to get up and sit on the chair but refused due to pain. Both oral narcotics and IV pain meds was given alternately as needed.

## 2019-03-08 NOTE — Plan of Care (Signed)
  Problem: Skin Integrity: Goal: Risk for impaired skin integrity will decrease Outcome: Progressing   Problem: Safety: Goal: Ability to remain free from injury will improve Outcome: Progressing   Problem: Pain Managment: Goal: General experience of comfort will improve Outcome: Progressing   

## 2019-03-08 NOTE — Consult Note (Addendum)
[facility] Renal Consultation Note  Indication for Consultation:  Management of ESRD/hemodialysis; anemia, hypertension/volume and secondary hyperparathyroidism  1. HPI: Alex Morrison is a 49 y.o. male with ESRD 2/2 DM type 1 /HTN  On HD MWF ([facility] ) ho PD failure (sp removal pd Cath 01/06/19) recent HP Reg Admit 2/09-15/21 Streptococcus mitis bacteremia 2/2  to P.cath with Pcath removal replaced , (Ancef on HD finished 03/06/19)    Now admitted   right tibial plateau fracture and right distal fibula fracture.after fall at work // yest 03/07/19 = Orthopedic surgery =Open reduction internal fixation right fibula and   Open reduction internal fixation right bicondylar tibial plateau fracture/ He has not missed any HD . K 4.3 today  hgb 7.3   Seen in room co mild post op pain R leg . No Sob, fevers, cp,or abdominal pain  .   Past Medical History:  Diagnosis Date  . Abscess of buttock   . Acute kidney injury ([place]) 09/27/2013  . Diabetes mellitus without complication ([place])   . ESRD (end stage renal disease) ([place])   . HTN (hypertension) 08/08/2018  . Perforated appendix    Peritonitis    Past Surgical History:  Procedure Laterality Date  . AV FISTULA PLACEMENT Left 01/13/2019   Procedure: LEFT ARM ARTERIOVENOUS (AV) FISTULA CREATION;  Surgeon: Elam Dutch, MD;  Location: [facility];  Service: Vascular;  Laterality: Left;  . Ex lap with ileocecectomy  09/27/13   Dr. Brantley Stage  . INCISION AND DRAINAGE    . INCISIONAL HERNIA REPAIR N/A 08/11/2018   Procedure: OPEN RETRORECTUS REPAIR OF INCARCERATED INCISIONAL HERNIA WITH BILATERAL POSTERIOR COMPONENT SEPARATION;  Surgeon: [name], MD;  Location: [facility];  Service: General;  Laterality: N/A;  . INSERTION OF MESH  08/11/2018   Procedure: INSERTION OF PHASIX MESH;  Surgeon: [name], MD;  Location: [facility];  Service: General;;  . insertion of PD catheter  12/2017  . IR FLUORO GUIDE CV LINE RIGHT  08/08/2018  . IR US GUIDE  VASC ACCESS RIGHT  08/08/2018  . LAPAROSCOPIC ABDOMINAL EXPLORATION N/A 09/27/2013  . LAPAROSCOPIC ASSISTED VENTRAL HERNIA REPAIR  2019  . LAPAROSCOPY N/A 09/27/2013   Procedure: LAPAROSCOPY DIAGNOSTIC;  Surgeon: Erroll Luna, MD;  Location: [facility];  Service: General;  Laterality: N/A;  . LAPAROTOMY  08/11/2018   Procedure: EXPLORATORY LAPAROTOMY;  Surgeon: [name], MD;  Location: [facility];  Service: General;;  . LYSIS OF ADHESION  08/11/2018   Procedure: LYSIS OF ADHESIONS X 30 MINUTES;  Surgeon: [name], MD;  Location: [facility];  Service: General;;  . MINOR REMOVAL OF PERITONEAL DIALYSIS CATHETER N/A 01/06/2019   Procedure: REMOVAL OF PERITONEAL DIALYSIS CATHETER;  Surgeon: Kieth Brightly, Arta Bruce, MD;  Location: [facility];  Service: General;  Laterality: N/A;  . SHOULDER SURGERY  1989      Family History  Problem Relation Age of Onset  . Diabetes Mellitus II Mother   . Diabetes Mother   . Heart disease Mother   . Diabetes Mellitus I Father   . Diabetes Father   . Diabetes Mellitus II Brother   . Diabetes Brother       reports that he has never smoked. He has never used smokeless tobacco. He reports that he does not drink alcohol or use drugs.  No Known Allergies  Prior to Admission medications   Medication Sig Start Date End Date Taking? Authorizing Provider  acetaminophen (TYLENOL) 500 MG tablet Take 2 tablets (1,000 mg  total) by mouth every 8 (eight) hours as needed for mild pain, moderate pain or fever. Patient taking differently: Take 500 mg by mouth every 8 (eight) hours as needed for mild pain, moderate pain or fever.  08/14/18  Yes Hongalgi, Lenis Dickinson, MD  amLODipine (NORVASC) 10 MG tablet Take 10 mg by mouth daily. 03/02/19  Yes [provider]  insulin aspart (NOVOLOG FLEXPEN) 100 UNIT/ML FlexPen Inject 3-9 Units into the skin 3 (three) times daily with meals.    Yes [provider]  Insulin Glargine (LANTUS SOLOSTAR) 100 UNIT/ML Solostar Pen Inject 10 Units  into the skin at bedtime.    Yes [provider]  sevelamer carbonate (RENVELA) 800 MG tablet Take 800-2,400 mg by mouth See admin instructions. Take 3 tablets (2400 mg) by mouth two times daily with meals, take 1-2 tablets ([id number] mg) with snacks. 01/20/19  Yes [provider]     Anti-infectives (From admission, onward)   Start     Dose/Rate Route Frequency Ordered Stop   03/07/19 2300  ceFAZolin (ANCEF) IVPB 2g/100 mL premix     2 g 200 mL/hr over 30 Minutes Intravenous Every 6 hours 03/07/19 2113 03/08/19 1034   03/07/19 0600  ceFAZolin (ANCEF) IVPB 2g/100 mL premix  Status:  Discontinued     2 g 200 mL/hr over 30 Minutes Intravenous To Short Stay 03/06/19 2023 03/07/19 2110      Results for orders placed or performed during the hospital encounter of 03/06/19 (from the past 48 hour(s))  SARS CORONAVIRUS 2 (TAT 6-24 HRS) Nasopharyngeal Nasopharyngeal Swab     Status: None   Collection Time: 03/06/19  4:39 PM   Specimen: Nasopharyngeal Swab  Result Value Ref Range   SARS Coronavirus 2 NEGATIVE NEGATIVE    Comment: (NOTE) SARS-CoV-2 target nucleic acids are NOT DETECTED. The SARS-CoV-2 RNA is generally detectable in upper and lower respiratory specimens during the acute phase of infection. Negative results do not preclude SARS-CoV-2 infection, do not rule out co-infections with other pathogens, and should not be used as the sole basis for treatment or other patient management decisions. Negative results must be combined with clinical observations, patient history, and epidemiological information. The expected result is Negative. Fact Sheet for Patients: SugarRoll.be Fact Sheet for Healthcare Providers: https://www.woods-mathews.com/ This test is not yet approved or cleared by the [place] FDA and  has been authorized for detection and/or diagnosis of SARS-CoV-2 by FDA under an Emergency Use Authorization (EUA). This  EUA will remain  in effect (meaning this test can be used) for the duration of the COVID-19 declaration under Section 56 4(b)(1) of the Act, 21 U.S.C. section 360bbb-3(b)(1), unless the authorization is terminated or revoked sooner. Performed at [facility], [place] [address], [place], [place] [address]   CBC with Differential     Status: Abnormal   Collection Time: 03/06/19  6:00 PM  Result Value Ref Range   WBC 8.7 4.0 - 10.5 K/uL   RBC 3.19 (L) 4.22 - 5.81 MIL/uL   Hemoglobin 8.9 (L) 13.0 - 17.0 g/dL   HCT 28.7 (L) 39.0 - 52.0 %   MCV 90.0 80.0 - 100.0 fL   MCH 27.9 26.0 - 34.0 pg   MCHC 31.0 30.0 - 36.0 g/dL   RDW 14.9 11.5 - 15.5 %   Platelets 318 150 - 400 K/uL   nRBC 0.0 0.0 - 0.2 %   Neutrophils Relative % 83 %   Neutro Abs 7.2 1.7 - 7.7 K/uL  Lymphocytes Relative 10 %   Lymphs Abs 0.9 0.7 - 4.0 K/uL   Monocytes Relative 6 %   Monocytes Absolute 0.5 0.1 - 1.0 K/uL   Eosinophils Relative 1 %   Eosinophils Absolute 0.0 0.0 - 0.5 K/uL   Basophils Relative 0 %   Basophils Absolute 0.0 0.0 - 0.1 K/uL   Immature Granulocytes 0 %   Abs Immature Granulocytes 0.03 0.00 - 0.07 K/uL    Comment: Performed at [facility], [place] [address], [place], [place] [id number]  Basic metabolic panel     Status: Abnormal   Collection Time: 03/06/19  6:00 PM  Result Value Ref Range   Sodium 136 135 - 145 mmol/L   Potassium 3.6 3.5 - 5.1 mmol/L   Chloride 95 (L) 98 - 111 mmol/L   CO2 29 22 - 32 mmol/L   Glucose, Bld 218 (H) 70 - 99 mg/dL   BUN 17 6 - 20 mg/dL   Creatinine, Ser 4.88 (H) 0.61 - 1.24 mg/dL   Calcium 8.2 (L) 8.9 - 10.3 mg/dL   GFR calc non Af Amer 13 (L) >60 mL/min   GFR calc Af Amer 15 (L) >60 mL/min   Anion gap 12 5 - 15    Comment: Performed at [facility] [address] [place], [place] [address]  APTT     Status: None   Collection Time: 03/06/19  7:21 PM  Result Value Ref Range   aPTT 29 24 - 36 seconds    Comment: Performed at [facility] [address] [place], [place] [address]  Protime-INR     Status: None   Collection Time: 03/06/19  7:21 PM  Result Value Ref Range   Prothrombin Time 13.5 11.4 - 15.2 seconds   INR 1.0 0.8 - 1.2    Comment: (NOTE) INR goal varies based on device and disease states. Performed at [facility], [place] [address], [place], [place] [address]   Hemoglobin A1c     Status: Abnormal   Collection Time: 03/06/19  7:21 PM  Result Value Ref Range   Hgb A1c MFr Bld 9.2 (H) 4.8 - 5.6 %    Comment: (NOTE) Pre diabetes:          5.7%-6.4% Diabetes:              >6.4% Glycemic control for   <7.0% adults with diabetes    Mean Plasma Glucose 217.34 mg/dL    Comment: Performed at [facility] [address], [place], [place] [address]  Vitamin B12     Status: None   Collection Time: 03/06/19  7:21 PM  Result Value Ref Range   Vitamin B-12 828 180 - 914 pg/mL    Comment: (NOTE) This assay is not validated for testing neonatal or myeloproliferative syndrome specimens for Vitamin B12 levels. Performed at [facility], [place] [address] [place], [place] [address]   Folate     Status: None   Collection Time: 03/06/19  7:21 PM  Result Value Ref Range   Folate 8.6 >5.9 ng/mL    Comment: Performed at [facility], [place] [address], [place], [place] [address]  Iron and TIBC     Status: Abnormal   Collection Time: 03/06/19  7:21 PM  Result Value Ref Range   Iron 20 (L) 45 - 182 ug/dL   TIBC 190 (L) 250 - 450 ug/dL   Saturation Ratios 11 (L) 17.9 -  39.5 %   UIBC 170 ug/dL    Comment: Performed at [facility], [place] [address] [place], [place] [address]  Ferritin     Status: Abnormal   Collection Time: 03/06/19  7:21 PM  Result Value Ref Range   Ferritin 547 (H) 24 - 336 ng/mL    Comment: Performed at [facility], [place] [address], [place], [place] [address]  Reticulocytes     Status: Abnormal   Collection Time: 03/06/19  7:21 PM  Result Value Ref Range    Retic Ct Pct 1.9 0.4 - 3.1 %   RBC. 2.98 (L) 4.22 - 5.81 MIL/uL   Retic Count, Absolute 56.0 19.0 - 186.0 K/uL   Immature Retic Fract 13.1 2.3 - 15.9 %    Comment: Performed at [facility] [address] [place], [place] [address]  Glucose, capillary     Status: Abnormal   Collection Time: 03/06/19  9:37 PM  Result Value Ref Range   Glucose-Capillary 219 (H) 70 - 99 mg/dL  Surgical pcr screen     Status: None   Collection Time: 03/06/19 10:09 PM   Specimen: Nasal Mucosa; Nasal Swab  Result Value Ref Range   MRSA, PCR NEGATIVE NEGATIVE   Staphylococcus aureus NEGATIVE NEGATIVE    Comment: (NOTE) The Xpert SA Assay (FDA approved for NASAL specimens in patients 51 years of age and older), is one component of a comprehensive surveillance program. It is not intended to diagnose infection nor to guide or monitor treatment. Performed at [facility], [place] [address] [place], [place] [id number]   Basic metabolic panel     Status: Abnormal   Collection Time: 03/07/19  3:34 AM  Result Value Ref Range   Sodium 134 (L) 135 - 145 mmol/L   Potassium 3.9 3.5 - 5.1 mmol/L   Chloride 93 (L) 98 - 111 mmol/L   CO2 29 22 - 32 mmol/L   Glucose, Bld 439 (H) 70 - 99 mg/dL   BUN 22 (H) 6 - 20 mg/dL   Creatinine, Ser 5.72 (H) 0.61 - 1.24 mg/dL   Calcium 7.8 (L) 8.9 - 10.3 mg/dL   GFR calc non Af Amer 11 (L) >60 mL/min   GFR calc Af Amer 12 (L) >60 mL/min   Anion gap 12 5 - 15    Comment: Performed at [facility] [address], [place], [place] [address]  CBC     Status: Abnormal   Collection Time: 03/07/19  3:34 AM  Result Value Ref Range   WBC 7.1 4.0 - 10.5 K/uL   RBC 2.67 (L) 4.22 - 5.81 MIL/uL   Hemoglobin 7.5 (L) 13.0 - 17.0 g/dL   HCT 23.9 (L) 39.0 - 52.0 %   MCV 89.5 80.0 - 100.0 fL   MCH 28.1 26.0 - 34.0 pg   MCHC 31.4 30.0 - 36.0 g/dL   RDW 15.0 11.5 - 15.5 %   Platelets 251 150 - 400 K/uL   nRBC 0.0 0.0 - 0.2 %    Comment: Performed at [facility], [place] [address], [place], [place] [address]  Glucose, capillary     Status: Abnormal   Collection Time: 03/07/19  6:22 AM  Result Value Ref Range   Glucose-Capillary 389 (H) 70 - 99 mg/dL  Glucose, capillary     Status: Abnormal   Collection Time: 03/07/19  8:19 AM  Result Value Ref Range   Glucose-Capillary 285 (H) 70 -  99 mg/dL  Glucose, capillary     Status: Abnormal   Collection Time: 03/07/19 11:16 AM  Result Value Ref Range   Glucose-Capillary 125 (H) 70 - 99 mg/dL  Glucose, capillary     Status: Abnormal   Collection Time: 03/07/19  2:21 PM  Result Value Ref Range   Glucose-Capillary 155 (H) 70 - 99 mg/dL  Type and screen [place]     Status: None   Collection Time: 03/07/19  4:45 PM  Result Value Ref Range   ABO/RH(D) O POS    Antibody Screen NEG    Sample Expiration      03/10/2019,2359 Performed at [facility], [place] [address] [place], [place] [address]   Glucose, capillary     Status: Abnormal   Collection Time: 03/07/19  4:45 PM  Result Value Ref Range   Glucose-Capillary 165 (H) 70 - 99 mg/dL  Glucose, capillary     Status: Abnormal   Collection Time: 03/07/19  6:21 PM  Result Value Ref Range   Glucose-Capillary 169 (H) 70 - 99 mg/dL  Glucose, capillary     Status: Abnormal   Collection Time: 03/07/19  8:18 PM  Result Value Ref Range   Glucose-Capillary 163 (H) 70 - 99 mg/dL  Renal function panel     Status: Abnormal   Collection Time: 03/08/19  4:12 AM  Result Value Ref Range   Sodium 137 135 - 145 mmol/L   Potassium 4.3 3.5 - 5.1 mmol/L   Chloride 98 98 - 111 mmol/L   CO2 28 22 - 32 mmol/L   Glucose, Bld 219 (H) 70 - 99 mg/dL   BUN 31 (H) 6 - 20 mg/dL   Creatinine, Ser 7.51 (H) 0.61 - 1.24 mg/dL   Calcium 7.8 (L) 8.9 - 10.3 mg/dL   Phosphorus 5.8 (H) 2.5 - 4.6 mg/dL   Albumin 2.1 (L) 3.5 - 5.0 g/dL   GFR calc non Af Amer 8 (L) >60 mL/min   GFR calc Af Amer 9 (L) >60 mL/min   Anion gap 11 5 - 15    Comment: Performed at  [facility] [address] [place], [place] [address]  CBC     Status: Abnormal   Collection Time: 03/08/19  4:12 AM  Result Value Ref Range   WBC 9.2 4.0 - 10.5 K/uL   RBC 2.62 (L) 4.22 - 5.81 MIL/uL   Hemoglobin 7.3 (L) 13.0 - 17.0 g/dL   HCT 23.5 (L) 39.0 - 52.0 %   MCV 89.7 80.0 - 100.0 fL   MCH 27.9 26.0 - 34.0 pg   MCHC 31.1 30.0 - 36.0 g/dL   RDW 15.0 11.5 - 15.5 %   Platelets 221 150 - 400 K/uL   nRBC 0.0 0.0 - 0.2 %    Comment: Performed at [facility], [place] [address], [place], [place] [address]  Magnesium     Status: None   Collection Time: 03/08/19  4:12 AM  Result Value Ref Range   Magnesium 2.0 1.7 - 2.4 mg/dL    Comment: Performed at [facility] [address], [place], [place] [address]  Glucose, capillary     Status: Abnormal   Collection Time: 03/08/19  7:30 AM  Result Value Ref Range   Glucose-Capillary 200 (H) 70 - 99 mg/dL  Glucose, capillary     Status: Abnormal   Collection Time: 03/08/19 11:45 AM  Result Value Ref Range   Glucose-Capillary 154 (  H) 70 - 99 mg/dL  .  ROS: see hpi  Physical Exam: Vitals:   03/08/19 0732 03/08/19 0800  BP: (!) 151/78   Pulse: 88   Resp: 18   Temp: 98.3 F (36.8 C)   SpO2: 100% 98%     General: alert WD , WN, adult male NAD HEENT: [place] , EOMI , NOt icteric  Neck: supple, no jvd Heart: RRR no appreciated M, R, G Lungs: CTA, non labored breathing  Abdomen: BS pos , soft, NT, ND Extremities: no pedal edema Left , R lower leg casted  Skin:  No overt rash appreciated  Neuro: Alert OX3 moves all extremities except R Lower  Dialysis Access:  Pos bruit  LUA AVF// L IJ P Cath   Dialysis Orders: Center: [facility]   on MWF . EDW 94 kg HD Bath 2k 2.25ca  Time 4hr Heparin 3000. Access LUA AVF insert 01/13/19 ,   L IJ P.Cath      Hec .4 mcg IV/HD  Mircera 75 mcg iv q 2 weeks(last on 02/16/19)   Other OP TFS = 16%   03/04/19 needs Iron load after recent bacteremia RX clear   Assessment/Plan 2. Right  Lower extremity  Fracture -ORIF of right tibial plateau fracture and right distal fibula fracture on 03/07/2019 by Dr. Mardelle Matte. 3. ESRD -  HD MWF schedule  K 4.3  4. Hypertension/volume  - bp stable  / Amlodipine 10 mg q day home med continue no excess vol on exam . 5. Anemia  - hgb 7.3  TFS 11% (03/06/19) RECENT  Bacteremia will hold Iron this week .  6. Metabolic bone disease -  hec. on hd / on Renvela as phos binder phos 5.8  7. Nutrition - diet renal /carb mod / renal vitamin /alb 2.1  Protein supplement nepro /pro stat 8. DM type 1 -per admit  9. Recent Strep bacteremia Admit (HP Reg. P cath related , Ancef on HD finished 03/06/19)   Ernest Haber, PA-C [facility] [phone] 03/08/2019, 1:25 PM

## 2019-03-09 ENCOUNTER — Encounter: Payer: Self-pay | Admitting: *Deleted

## 2019-03-09 DIAGNOSIS — I120 Hypertensive chronic kidney disease with stage 5 chronic kidney disease or end stage renal disease: Secondary | ICD-10-CM | POA: Diagnosis not present

## 2019-03-09 DIAGNOSIS — S82831A Other fracture of upper and lower end of right fibula, initial encounter for closed fracture: Secondary | ICD-10-CM | POA: Diagnosis not present

## 2019-03-09 DIAGNOSIS — W19XXXA Unspecified fall, initial encounter: Secondary | ICD-10-CM | POA: Diagnosis not present

## 2019-03-09 DIAGNOSIS — Z992 Dependence on renal dialysis: Secondary | ICD-10-CM | POA: Diagnosis not present

## 2019-03-09 DIAGNOSIS — N189 Chronic kidney disease, unspecified: Secondary | ICD-10-CM | POA: Diagnosis not present

## 2019-03-09 DIAGNOSIS — S82141A Displaced bicondylar fracture of right tibia, initial encounter for closed fracture: Secondary | ICD-10-CM | POA: Diagnosis not present

## 2019-03-09 DIAGNOSIS — E109 Type 1 diabetes mellitus without complications: Secondary | ICD-10-CM | POA: Diagnosis not present

## 2019-03-09 DIAGNOSIS — N186 End stage renal disease: Secondary | ICD-10-CM | POA: Diagnosis not present

## 2019-03-09 LAB — GLUCOSE, CAPILLARY
Glucose-Capillary: 224 mg/dL — ABNORMAL HIGH (ref 70–99)
Glucose-Capillary: 132 mg/dL — ABNORMAL HIGH (ref 70–99)
Glucose-Capillary: 208 mg/dL — ABNORMAL HIGH (ref 70–99)
Glucose-Capillary: 134 mg/dL — ABNORMAL HIGH (ref 70–99)

## 2019-03-09 LAB — CBC
HCT: 23.8 % — ABNORMAL LOW (ref 39.0–52.0)
HCT: 24.4 % — ABNORMAL LOW (ref 39.0–52.0)
Hemoglobin: 7.2 g/dL — ABNORMAL LOW (ref 13.0–17.0)
Hemoglobin: 7.3 g/dL — ABNORMAL LOW (ref 13.0–17.0)
MCH: 27.8 pg (ref 26.0–34.0)
MCH: 27.9 pg (ref 26.0–34.0)
MCHC: 30.3 g/dL (ref 30.0–36.0)
MCHC: 29.9 g/dL — ABNORMAL LOW (ref 30.0–36.0)
MCV: 91.9 fL (ref 80.0–100.0)
MCV: 93.1 fL (ref 80.0–100.0)
Platelets: 221 10*3/uL (ref 150–400)
Platelets: 242 10*3/uL (ref 150–400)
RBC: 2.59 MIL/uL — ABNORMAL LOW (ref 4.22–5.81)
RBC: 2.62 MIL/uL — ABNORMAL LOW (ref 4.22–5.81)
RDW: 14.8 % (ref 11.5–15.5)
RDW: 14.9 % (ref 11.5–15.5)
WBC: 7.3 10*3/uL (ref 4.0–10.5)
WBC: 7.2 10*3/uL (ref 4.0–10.5)
nRBC: 0 % (ref 0.0–0.2)
nRBC: 0 % (ref 0.0–0.2)

## 2019-03-09 LAB — RENAL FUNCTION PANEL
Albumin: 2 g/dL — ABNORMAL LOW (ref 3.5–5.0)
Anion gap: 15 (ref 5–15)
BUN: 48 mg/dL — ABNORMAL HIGH (ref 6–20)
CO2: 28 mmol/L (ref 22–32)
Calcium: 8.2 mg/dL — ABNORMAL LOW (ref 8.9–10.3)
Chloride: 94 mmol/L — ABNORMAL LOW (ref 98–111)
Creatinine, Ser: 9.46 mg/dL — ABNORMAL HIGH (ref 0.61–1.24)
GFR calc Af Amer: 7 mL/min — ABNORMAL LOW (ref >60)
GFR calc non Af Amer: 6 mL/min — ABNORMAL LOW (ref >60)
Glucose, Bld: 226 mg/dL — ABNORMAL HIGH (ref 70–99)
Phosphorus: 6.9 mg/dL — ABNORMAL HIGH (ref 2.5–4.6)
Potassium: 4.2 mmol/L (ref 3.5–5.1)
Sodium: 137 mmol/L (ref 135–145)

## 2019-03-09 LAB — MAGNESIUM: Magnesium: 2.2 mg/dL (ref 1.7–2.4)

## 2019-03-09 MED ORDER — DOXERCALCIFEROL 4 MCG/2ML IV SOLN
INTRAVENOUS | Status: AC
  Administered 2019-03-09: 4 ug via INTRAVENOUS
  Filled 2019-03-09: qty 2

## 2019-03-09 MED ORDER — HYDROMORPHONE HCL 1 MG/ML IJ SOLN
INTRAMUSCULAR | Status: AC
  Administered 2019-03-09: 1 mg via INTRAVENOUS
  Filled 2019-03-09: qty 1

## 2019-03-09 MED ORDER — ATORVASTATIN CALCIUM 10 MG PO TABS: 20.0000 mg | ORAL_TABLET | Freq: Every day | ORAL | Status: DC

## 2019-03-09 MED ORDER — HEPARIN SODIUM (PORCINE) 1000 UNIT/ML IJ SOLN
INTRAMUSCULAR | Status: AC
  Administered 2019-03-09: 1000 [IU]
  Filled 2019-03-09: qty 3

## 2019-03-09 MED ORDER — INSULIN ASPART 100 UNIT/ML ~~LOC~~ SOLN
0.0000 [IU] | Freq: Every day | SUBCUTANEOUS | Status: DC
  Administered 2019-03-11: 20:00:00 2 [IU] via SUBCUTANEOUS

## 2019-03-09 MED ORDER — INSULIN ASPART 100 UNIT/ML ~~LOC~~ SOLN
0.0000 [IU] | Freq: Three times a day (TID) | SUBCUTANEOUS | Status: DC
  Administered 2019-03-09: 18:00:00 5 [IU] via SUBCUTANEOUS
  Administered 2019-03-10: 09:00:00 3 [IU] via SUBCUTANEOUS
  Administered 2019-03-11: 19:00:00 4 [IU] via SUBCUTANEOUS
  Administered 2019-03-12: 18:00:00 2 [IU] via SUBCUTANEOUS
  Administered 2019-03-12 – 2019-03-13 (×2): 3 [IU] via SUBCUTANEOUS

## 2019-03-09 NOTE — Progress Notes (Signed)
Subjective: 2 Days Post-Op s/p Procedure(s): OPEN REDUCTION INTERNAL FIXATION (ORIF) FIBULA FRACTURE Open Reduction Internal Fixation (Orif) Tibial Plateau   Patient is alert, oriented, laying comfortably in bed. Patient reports pain is moderate, improved from prior to surgery but still needed IV pain medication.   Denies chest pain, SOB, Calf pain. No nausea/vomiting. Largest concern today is where he will be discharged to given non-weight bearing status.   Objective:  PE: VITALS:   Vitals:   03/09/19 1000 03/09/19 1030 03/09/19 1100 03/09/19 1130  BP: 119/64 112/63 100/62 134/68  Pulse: 84 83 84 94  Resp:    18  Temp:    98.7 F (37.1 C)  TempSrc:    Oral  SpO2:    98%  Weight:    93 kg  Height:       General: Patient laying in bed, in no acute distress. RLE: posterior splint and knee immobilizer intact. Dressings in place with mild strike through at tibial dressing. Able to move all toes of right foot. Right foot warm and well perfused. Distal sensation intact. Compartments soft, compressible.    LABS  Results for orders placed or performed during the hospital encounter of 03/06/19 (from the past 24 hour(s))  Glucose, capillary     Status: Abnormal   Collection Time: 03/08/19  4:38 PM  Result Value Ref Range   Glucose-Capillary 198 (H) 70 - 99 mg/dL  Glucose, capillary     Status: Abnormal   Collection Time: 03/08/19  9:10 PM  Result Value Ref Range   Glucose-Capillary 179 (H) 70 - 99 mg/dL   Comment 1 Document in Chart   Magnesium     Status: None   Collection Time: 03/09/19  4:30 AM  Result Value Ref Range   Magnesium 2.2 1.7 - 2.4 mg/dL  CBC     Status: Abnormal   Collection Time: 03/09/19  4:30 AM  Result Value Ref Range   WBC 7.3 4.0 - 10.5 K/uL   RBC 2.59 (L) 4.22 - 5.81 MIL/uL   Hemoglobin 7.2 (L) 13.0 - 17.0 g/dL   HCT 23.8 (L) 39.0 - 52.0 %   MCV 91.9 80.0 - 100.0 fL   MCH 27.8 26.0 - 34.0 pg   MCHC 30.3 30.0 - 36.0 g/dL   RDW 14.8 11.5 - 15.5 %    Platelets 221 150 - 400 K/uL   nRBC 0.0 0.0 - 0.2 %  Renal function panel     Status: Abnormal   Collection Time: 03/09/19  4:30 AM  Result Value Ref Range   Sodium 137 135 - 145 mmol/L   Potassium 4.2 3.5 - 5.1 mmol/L   Chloride 94 (L) 98 - 111 mmol/L   CO2 28 22 - 32 mmol/L   Glucose, Bld 226 (H) 70 - 99 mg/dL   BUN 48 (H) 6 - 20 mg/dL   Creatinine, Ser 9.46 (H) 0.61 - 1.24 mg/dL   Calcium 8.2 (L) 8.9 - 10.3 mg/dL   Phosphorus 6.9 (H) 2.5 - 4.6 mg/dL   Albumin 2.0 (L) 3.5 - 5.0 g/dL   GFR calc non Af Amer 6 (L) >60 mL/min   GFR calc Af Amer 7 (L) >60 mL/min   Anion gap 15 5 - 15  Glucose, capillary     Status: Abnormal   Collection Time: 03/09/19  6:40 AM  Result Value Ref Range   Glucose-Capillary 224 (H) 70 - 99 mg/dL   Comment 1 Document in Chart   CBC  Status: Abnormal   Collection Time: 03/09/19  7:38 AM  Result Value Ref Range   WBC 7.2 4.0 - 10.5 K/uL   RBC 2.62 (L) 4.22 - 5.81 MIL/uL   Hemoglobin 7.3 (L) 13.0 - 17.0 g/dL   HCT 24.4 (L) 39.0 - 52.0 %   MCV 93.1 80.0 - 100.0 fL   MCH 27.9 26.0 - 34.0 pg   MCHC 29.9 (L) 30.0 - 36.0 g/dL   RDW 14.9 11.5 - 15.5 %   Platelets 242 150 - 400 K/uL   nRBC 0.0 0.0 - 0.2 %  Glucose, capillary     Status: Abnormal   Collection Time: 03/09/19 12:24 PM  Result Value Ref Range   Glucose-Capillary 132 (H) 70 - 99 mg/dL    DG Tibia/Fibula Right  Result Date: 03/07/2019 CLINICAL DATA:  Fractures of the tibial to plateau and lateral malleolus EXAM: RIGHT TIBIA AND FIBULA - 2 VIEW COMPARISON:  CT knee and ankle radiographs dated 03/06/2019 FINDINGS: Intraoperative fluoroscopy images demonstrate open reduction internal fixation with plate and screws for a lateral tibial plateau fracture which is in improved alignment. The patient is also status post open reduction internal fixation with plate and screws for a lateral malleolus fracture which is in improved alignment. There is no evidence of hardware failure. IMPRESSION: Status  post open reduction internal fixation of lateral tibial plateau and lateral malleolus fractures. No evidence of hardware failure. Electronically Signed   By: Zerita Boers M.D.   On: 03/07/2019 19:57   DG Ankle Complete Right  Result Date: 03/07/2019 CLINICAL DATA:  Postop. EXAM: RIGHT ANKLE - COMPLETE 3+ VIEW COMPARISON:  03/06/2019 FINDINGS: The patient is now status post plate screw fixation of the distal fibula. Overlying skin staples are noted. There is surrounding soft tissue swelling. The osseous alignment is improved. There is an overlying plaster splint that obscures bony details. IMPRESSION: Status post plate and screw fixation of the distal fibula with improved osseous alignment. Electronically Signed   By: Constance Holster M.D.   On: 03/07/2019 22:24   DG Knee Complete 4 Views Right  Result Date: 03/07/2019 CLINICAL DATA:  Postop. EXAM: RIGHT KNEE - COMPLETE 4+ VIEW COMPARISON:  03/06/2019 FINDINGS: The patient is now status post plate screw fixation of the proximal tibia. The alignment is improved. Overlying skin staples are noted. There is overlying soft tissue swelling. There is no evidence for hardware fracture or failure. IMPRESSION: Status post plate and screw fixation of the proximal tibia with improved alignment. Electronically Signed   By: Constance Holster M.D.   On: 03/07/2019 22:25   DG C-Arm 1-60 Min  Result Date: 03/07/2019 CLINICAL DATA:  Status post fixation of lateral malleolus and lateral tibial plateau fractures. EXAM: DG C-ARM 1-60 MIN CONTRAST:  None FLUOROSCOPY TIME:  Fluoroscopy Time:  1 minutes 28 seconds Radiation Exposure Index (if provided by the fluoroscopic device): Not applicable Number of Acquired Spot Images: 0 COMPARISON:  CT knee and ankle radiographs dated 03/06/2019 FINDINGS: Intraoperative fluoroscopy images demonstrate open reduction internal fixation with plate and screws for a lateral tibial plateau fracture which is in improved alignment. The  patient is also status post open reduction internal fixation with plate and screws for a lateral malleolus fracture which is in improved alignment. There is no evidence of hardware failure. IMPRESSION: Status post open reduction internal fixation of lateral tibial plateau and lateral malleolus fractures. Electronically Signed   By: Zerita Boers M.D.   On: 03/07/2019 19:58    Assessment/Plan:  Active Problems:   Tibial plateau fracture R distal fibula fracture   2 Days Post-Op s/p Procedure(s): OPEN REDUCTION INTERNAL FIXATION (ORIF) FIBULA FRACTURE Open Reduction Internal Fixation (Orif) Tibial Plateau  Weightbearing: Non-weight bearing in RLE for 6-8 weeks post-operatively. Up with therapy Insicional and dressing care: PRN dressing changes Orthopedic device(s): Continue posterior splint and knee immobilizer. Ace-wrap in place to help with swelling. VTE prophylaxis: heparin while in hospital, spoke with Dr. Cyndia Skeeters and we'll plan on aspirin at discharge Pain control: continue current prn pain control regimen Follow - up plan: Plan to follow-up with Dr. Mardelle Matte 2 weeks after discharge.  Disposition: TOC consult placed. Patient and father highly concerned regarding where he can be discharge to. They do not feel discharge to his own home is an option as [place] many stairs that they do not feel he will be able to manage with crutches. Father states and he his wife were prepared to have patient stay in downstairs bedroom, but do not feel they are physically capable of helping with assists as they are both over 69 and have sustained falls this year. SNF vs CIR may be best option for patient in short term as long as he can continue to have access to MWF dialysis schedule. Will await results from PT eval.   Contact information:   Weekdays 8-5 Merlene Pulling, PA-C [phone] A fter hours and holidays please check [email or web address] for group call information for [facility]  Ventura Bruns 03/09/2019, 2:37 PM

## 2019-03-09 NOTE — Progress Notes (Signed)
Gave report to [place] from hemodialysis. Patient is resting without any complaints. VSS. PRN oxycodone given at 0602. Left AVFistula positive bruit and thrill. Lt [place] hemodialysis catheter intact.

## 2019-03-09 NOTE — Progress Notes (Signed)
PT Cancellation Note  Patient Details Name: Alex Morrison MRN: [name] DOB: 1970/11/01   Cancelled Treatment:    Reason Eval/Treat Not Completed: Patient at procedure or test/unavailable   Revecca Nachtigal B [name] 03/09/2019, 7:24 AM  [name], PT Acute Rehabilitation Services Pager: [phone] Office: [phone]

## 2019-03-09 NOTE — Social Work (Signed)
Await therapy evaluations, pt with fx and dialysis.   Westley Hummer, MSW, [place] [facility]

## 2019-03-09 NOTE — Progress Notes (Signed)
[facility] Progress Note   Subjective:  Note delayed - seen while on HD this morning. No CP/dyspnea at the time.  Objective Vitals:   03/09/19 1000 03/09/19 1030 03/09/19 1100 03/09/19 1130  BP: 119/64 112/63 100/62 134/68  Pulse: 84 83 84 94  Resp:    18  Temp:    98.7 F (37.1 C)  TempSrc:    Oral  SpO2:    98%  Weight:    93 kg  Height:       Physical Exam General: Well appearing man, NAD Heart: RRR; no murmur Lungs: CTA anteriorly Abdomen: soft Extremities: R leg with large soft brace to mid-thigh, no LLE edema Dialysis Access: [facility]  Additional Objective Labs: Basic Metabolic Panel: Recent Labs  Lab 03/07/19 0334 03/08/19 0412 03/09/19 0430  NA 134* 137 137  K 3.9 4.3 4.2  CL 93* 98 94*  CO2 29 28 28   GLUCOSE 439* 219* 226*  BUN 22* 31* 48*  CREATININE 5.72* 7.51* 9.46*  CALCIUM 7.8* 7.8* 8.2*  PHOS  --  5.8* 6.9*   Liver Function Tests: Recent Labs  Lab 03/08/19 0412 03/09/19 0430  ALBUMIN 2.1* 2.0*   CBC: Recent Labs  Lab 03/06/19 1800 03/06/19 1800 03/07/19 0334 03/07/19 0334 03/08/19 0412 03/09/19 0430 03/09/19 0738  WBC 8.7   < > 7.1   < > 9.2 7.3 7.2  NEUTROABS 7.2  --   --   --   --   --   --   HGB 8.9*   < > 7.5*   < > 7.3* 7.2* 7.3*  HCT 28.7*   < > 23.9*   < > 23.5* 23.8* 24.4*  MCV 90.0  --  89.5  --  89.7 91.9 93.1  PLT 318   < > 251   < > 221 221 242   < > = values in this interval not displayed.   Iron Studies:  Recent Labs    03/06/19 1921  IRON 20*  TIBC 190*  FERRITIN 547*    Studies/Results: DG Tibia/Fibula Right  Result Date: 03/07/2019 CLINICAL DATA:  Fractures of the tibial to plateau and lateral malleolus EXAM: RIGHT TIBIA AND FIBULA - 2 VIEW COMPARISON:  CT knee and ankle radiographs dated 03/06/2019 FINDINGS: Intraoperative fluoroscopy images demonstrate open reduction internal fixation with plate and screws for a lateral tibial plateau fracture which is in improved alignment. The patient is  also status post open reduction internal fixation with plate and screws for a lateral malleolus fracture which is in improved alignment. There is no evidence of hardware failure. IMPRESSION: Status post open reduction internal fixation of lateral tibial plateau and lateral malleolus fractures. No evidence of hardware failure. Electronically Signed   By: Zerita Boers M.D.   On: 03/07/2019 19:57   DG Ankle Complete Right  Result Date: 03/07/2019 CLINICAL DATA:  Postop. EXAM: RIGHT ANKLE - COMPLETE 3+ VIEW COMPARISON:  03/06/2019 FINDINGS: The patient is now status post plate screw fixation of the distal fibula. Overlying skin staples are noted. There is surrounding soft tissue swelling. The osseous alignment is improved. There is an overlying plaster splint that obscures bony details. IMPRESSION: Status post plate and screw fixation of the distal fibula with improved osseous alignment. Electronically Signed   By: Constance Holster M.D.   On: 03/07/2019 22:24   DG Knee Complete 4 Views Right  Result Date: 03/07/2019 CLINICAL DATA:  Postop. EXAM: RIGHT KNEE - COMPLETE 4+ VIEW COMPARISON:  03/06/2019 FINDINGS: The  patient is now status post plate screw fixation of the proximal tibia. The alignment is improved. Overlying skin staples are noted. There is overlying soft tissue swelling. There is no evidence for hardware fracture or failure. IMPRESSION: Status post plate and screw fixation of the proximal tibia with improved alignment. Electronically Signed   By: Constance Holster M.D.   On: 03/07/2019 22:25   DG C-Arm 1-60 Min  Result Date: 03/07/2019 CLINICAL DATA:  Status post fixation of lateral malleolus and lateral tibial plateau fractures. EXAM: DG C-ARM 1-60 MIN CONTRAST:  None FLUOROSCOPY TIME:  Fluoroscopy Time:  1 minutes 28 seconds Radiation Exposure Index (if provided by the fluoroscopic device): Not applicable Number of Acquired Spot Images: 0 COMPARISON:  CT knee and ankle radiographs dated  03/06/2019 FINDINGS: Intraoperative fluoroscopy images demonstrate open reduction internal fixation with plate and screws for a lateral tibial plateau fracture which is in improved alignment. The patient is also status post open reduction internal fixation with plate and screws for a lateral malleolus fracture which is in improved alignment. There is no evidence of hardware failure. IMPRESSION: Status post open reduction internal fixation of lateral tibial plateau and lateral malleolus fractures. Electronically Signed   By: Zerita Boers M.D.   On: 03/07/2019 19:58   Medications: . methocarbamol (ROBAXIN) IV     . amLODipine  10 mg Oral Daily  . Chlorhexidine Gluconate Cloth  6 each Topical Daily  . docusate sodium  100 mg Oral BID  . doxercalciferol  4 mcg Intravenous Q M,W,F-HD  . feeding supplement (NEPRO CARB STEADY)  237 mL Oral BID BM  . feeding supplement (PRO-STAT SUGAR FREE 64)  30 mL Oral BID  . heparin  5,000 Units Subcutaneous Q8H  . insulin aspart  0-15 Units Subcutaneous TID WC  . insulin aspart  0-5 Units Subcutaneous QHS  . insulin aspart  4 Units Subcutaneous TID WC  . insulin detemir  7 Units Subcutaneous BID  . multivitamin  1 tablet Oral QHS  . senna  1 tablet Oral BID  . sevelamer carbonate  2,400 mg Oral BID WC    Dialysis Orders: MWF at [facility] 94 kg, 2K/2.25Ca, 4hr, Heparin 3000, LUA AVF insert 01/13/19 + L IJ cath - Hectoral 4 mcg IV/HD  - Mircera 75 mcg IV q 2 weeks (last on 02/16/19)  Assessment/Plan: 1. R tibial plateau + R distal Fibular Fx: S/p ORIF on 03/07/2019 by Dr. Mardelle Matte. 2. ESRD: Continue HD per MWF sched - HD today. 3. Hypertension/volume: BP stable - no edema. 4. Anemia: Hgb 7.3. Tsat 11% (03/06/19) - IV iron was held d/t recent bacteremia. 5. Metabolic bone disease: Ca ok, Phos ^ - continue renvela as binder. 6. Nutrition: Alb low - continue nepro /pro stat 7. DM type 1 -per admit  8. Recent Strep bacteremia admit - felt d/t PD cath, removed, s/p  Cefazolin thru 03/06/19  Veneta Penton, PA-C 03/09/2019, 1:36 PM  [facility] Pager: [phone]

## 2019-03-09 NOTE — Progress Notes (Signed)
PROGRESS NOTE  Alex Morrison [facility] [id number] DOB: Jun 12, 1970   PCP: Haywood Pao, MD  Patient is from: Home.  Independently ambulates at baseline.  DOA: 03/06/2019 LOS: 3  Brief Narrative / Interim history: 49 y.o. male with history of ESRD on HD MWF, DM-1, HTN and recent hospitalization at [facility] from 2/9-2/15 with Streptococcus mitis bacteremia presenting with mechanical fall from loading dock about 4 feet high and subsequent right tibial plateau fracture and right distal fibula fracture.  Orthopedic surgery consulted, and planning surgical management of right tibial plateau fracture.  Nephrology consulted for HD in case he happens to be here on Monday. Patient underwent ORIF of right tibial plateau fracture and right distal fibula fracture on 03/06/2018 by Dr. Mardelle Matte.  Subjective: No major events overnight or this morning.  Pain fairly controlled.  Has gotten 5 doses of IV Dilaudid yesterday and 2 doses this morning so far in addition to oral analgesics.  Has not worked with therapy yet. Denies chest pain, dyspnea, dizziness or GI symptoms. Objective: Vitals:   03/09/19 1000 03/09/19 1030 03/09/19 1100 03/09/19 1130  BP: 119/64 112/63 100/62 134/68  Pulse: 84 83 84 94  Resp:    18  Temp:    98.7 F (37.1 C)  TempSrc:    Oral  SpO2:    98%  Weight:    93 kg  Height:        Intake/Output Summary (Last 24 hours) at 03/09/2019 1223 Last data filed at 03/09/2019 1130 Gross per 24 hour  Intake 240 ml  Output 2440 ml  Net -2200 ml   Filed Weights   03/06/19 2028 03/09/19 0725 03/09/19 1130  Weight: 93.5 kg 95.7 kg 93 kg    Examination:  GENERAL: No acute distress.  Appears well.  HEENT: MMM.  Vision and hearing grossly intact.  NECK: Supple.  No apparent JVD.  RESP:  No IWOB. Good air movement bilaterally. CVS:  RRR. Heart sounds normal.  aVF in LUE.  TTS over left chest. ABD/GI/GU: Bowel sounds present. Soft. Non tender.  MSK/EXT: RLE in knee immobilizer, dressing  and Ace wrap.  Neurovascular intact in his toes. SKIN: no apparent skin lesion or wound NEURO: Awake, alert and oriented appropriately.  No apparent focal neuro deficit. PSYCH: Calm. Normal affect.  GENERAL: No acute distress.  Appears well.  HEENT: MMM.  Vision and hearing grossly intact.  NECK: Supple.  No apparent JVD.  RESP:  No IWOB. Good air movement bilaterally. CVS:  RRR. Heart sounds normal.  aVF in LUE.  TTS over left chest. ABD/GI/GU: Bowel sounds present. Soft. Non tender.  MSK/EXT: RLE in knee immobilizer, dressing and Ace wrap.  Neurovascular intact in his toes. SKIN: no apparent skin lesion or wound NEURO: Awake, alert and oriented appropriately.  No apparent focal neuro deficit. PSYCH: Calm. Normal affect.  Procedures:  2/20-ORIF of right tibial plateau fracture and right distal fibula fracture on 03/07/2019 by Dr. Mardelle Matte.  Assessment & Plan: Mechanical fall from loading dock about 4 feet high Comminuted, displaced and depressed fracture lateral tibial plateau associated with hemarthrosis-noted on x-ray and CT. Displaced fracture of the distal right fibula-noted on x-ray. -ORIF of right tibial plateau fracture and right distal fibula fracture on 03/07/2019 by Dr. Mardelle Matte. -Pain control per orthopedic surgeon-continues to require IV pain medication. -Subcu heparin for VTE prophylaxis-ESRD patient -Per Ortho, NWB on RLE for 6 to 8 weeks and outpt follow up in 2 weeks -PT/OT eval  Uncontrolled DM-1: A1c 9.2%.  On  Lantus 10 units nightly and SSI at home CBG (last 3)  Recent Labs    03/08/19 1638 03/08/19 2110 03/09/19 0640  GLUCAP 198* 179* 224*  -Increase SSI to moderate -Continue Levemir 7 units twice daily and NovoLog AC 4 units -Further adjustment as appropriate. -Start low-dose statin  ESRD on HD MWF/BMD:  full HD on 2/19.  No indication for urgent dialysis. -Nephrology notified.  Anemia of renal disease: Baseline Hgb 8-9> 8.9 (admit)> 7.5> 7.3.  Anemia  panel consistent with both IDA and ACD.  Denies melena or hematochezia.  Hesitant about blood transfusion. -Continue monitoring -May need ESA and iron infusion.  Essential hypertension: BP fairly controlled. -Continue home amlodipine and as needed labetalol  Streptococcus mitis bacteremia-hospitalized for this at [facility] 2/9-2/15 and discharged on Ancef with HD that he completed on 2/19.  Bacteremia thought to be from HD cath.  HD cath replaced.  TTE reportedly negative for vegetation.  Constipation -Continue MiraLAX and Senokot               DVT prophylaxis: Subcu heparin Code Status: Full code Family Communication: Patient and/or RN. Available if any question.  Discharge barrier: Therapy evaluation and adequate pain control.  Requiring IV pain medications. Patient is from: Home Final disposition: Likely home after therapy evaluation and pain control.  Consultants: Orthopedic surgery   Microbiology summarized: [id number] negative.  Sch Meds:  Scheduled Meds: . amLODipine  10 mg Oral Daily  . Chlorhexidine Gluconate Cloth  6 each Topical Daily  . docusate sodium  100 mg Oral BID  . doxercalciferol  4 mcg Intravenous Q M,W,F-HD  . feeding supplement (NEPRO CARB STEADY)  237 mL Oral BID BM  . feeding supplement (PRO-STAT SUGAR FREE 64)  30 mL Oral BID  . heparin  5,000 Units Subcutaneous Q8H  . insulin aspart  0-5 Units Subcutaneous QHS  . insulin aspart  0-9 Units Subcutaneous TID WC  . insulin aspart  4 Units Subcutaneous TID WC  . insulin detemir  7 Units Subcutaneous BID  . multivitamin  1 tablet Oral QHS  . senna  1 tablet Oral BID  . sevelamer carbonate  2,400 mg Oral BID WC   Continuous Infusions: . methocarbamol (ROBAXIN) IV     PRN Meds:.albuterol, bisacodyl, diphenhydrAMINE, HYDROmorphone (DILAUDID) injection, labetalol, methocarbamol **OR** methocarbamol (ROBAXIN) IV, metoCLOPramide **OR** metoCLOPramide (REGLAN) injection, ondansetron **OR**  ondansetron (ZOFRAN) IV, oxyCODONE, oxyCODONE, polyethylene glycol, zolpidem  Antimicrobials: Anti-infectives (From admission, onward)   Start     Dose/Rate Route Frequency Ordered Stop   03/07/19 2300  ceFAZolin (ANCEF) IVPB 2g/100 mL premix     2 g 200 mL/hr over 30 Minutes Intravenous Every 6 hours 03/07/19 2113 03/08/19 1034   03/07/19 0600  ceFAZolin (ANCEF) IVPB 2g/100 mL premix  Status:  Discontinued     2 g 200 mL/hr over 30 Minutes Intravenous To Short Stay 03/06/19 2023 03/07/19 2110       I have personally reviewed the following labs and images: CBC: Recent Labs  Lab 03/06/19 1800 03/07/19 0334 03/08/19 0412 03/09/19 0430 03/09/19 0738  WBC 8.7 7.1 9.2 7.3 7.2  NEUTROABS 7.2  --   --   --   --   HGB 8.9* 7.5* 7.3* 7.2* 7.3*  HCT 28.7* 23.9* 23.5* 23.8* 24.4*  MCV 90.0 89.5 89.7 91.9 93.1  PLT 318 251 221 221 242   BMP &GFR Recent Labs  Lab 03/06/19 1800 03/07/19 0334 03/08/19 0412 03/09/19 0430  NA 136 134* 137  137  K 3.6 3.9 4.3 4.2  CL 95* 93* 98 94*  CO2 29 29 28 28   GLUCOSE 218* 439* 219* 226*  BUN 17 22* 31* 48*  CREATININE 4.88* 5.72* 7.51* 9.46*  CALCIUM 8.2* 7.8* 7.8* 8.2*  MG  --   --  2.0 2.2  PHOS  --   --  5.8* 6.9*   Estimated Creatinine Clearance: 11 mL/min (A) (by C-G formula based on SCr of 9.46 mg/dL (H)). Liver & Pancreas: Recent Labs  Lab 03/08/19 0412 03/09/19 0430  ALBUMIN 2.1* 2.0*   No results for input(s): LIPASE, AMYLASE in the last 168 hours. No results for input(s): AMMONIA in the last 168 hours. Diabetic: Recent Labs    03/06/19 1921  HGBA1C 9.2*   Recent Labs  Lab 03/08/19 0730 03/08/19 1145 03/08/19 1638 03/08/19 2110 03/09/19 0640  GLUCAP 200* 154* 198* 179* 224*   Cardiac Enzymes: No results for input(s): CKTOTAL, CKMB, CKMBINDEX, TROPONINI in the last 168 hours. No results for input(s): PROBNP in the last 8760 hours. Coagulation Profile: Recent Labs  Lab 03/06/19 1921  INR 1.0   Thyroid  Function Tests: No results for input(s): TSH, T4TOTAL, FREET4, T3FREE, THYROIDAB in the last 72 hours. Lipid Profile: No results for input(s): CHOL, HDL, LDLCALC, TRIG, CHOLHDL, LDLDIRECT in the last 72 hours. Anemia Panel: Recent Labs    03/06/19 1921  VITAMINB12 828  FOLATE 8.6  FERRITIN 547*  TIBC 190*  IRON 20*  RETICCTPCT 1.9   Urine analysis:    Component Value Date/Time   COLORURINE STRAW (A) 08/07/2018 2328   APPEARANCEUR CLEAR 08/07/2018 2328   LABSPEC 1.010 08/07/2018 2328   PHURINE 6.0 08/07/2018 2328   GLUCOSEU >=500 (A) 08/07/2018 2328   HGBUR SMALL (A) 08/07/2018 2328   BILIRUBINUR NEGATIVE 08/07/2018 2328   KETONESUR NEGATIVE 08/07/2018 2328   PROTEINUR 100 (A) 08/07/2018 2328   UROBILINOGEN 0.2 09/28/2013 1034   NITRITE NEGATIVE 08/07/2018 2328   LEUKOCYTESUR NEGATIVE 08/07/2018 2328   Sepsis Labs: Invalid input(s): PROCALCITONIN, [place]  Microbiology: Recent Results (from the past 240 hour(s))  SARS CORONAVIRUS 2 (TAT 6-24 HRS) Nasopharyngeal Nasopharyngeal Swab     Status: None   Collection Time: 03/06/19  4:39 PM   Specimen: Nasopharyngeal Swab  Result Value Ref Range Status   SARS Coronavirus 2 NEGATIVE NEGATIVE Final    Comment: (NOTE) SARS-CoV-2 target nucleic acids are NOT DETECTED. The SARS-CoV-2 RNA is generally detectable in upper and lower respiratory specimens during the acute phase of infection. Negative results do not preclude SARS-CoV-2 infection, do not rule out co-infections with other pathogens, and should not be used as the sole basis for treatment or other patient management decisions. Negative results must be combined with clinical observations, patient history, and epidemiological information. The expected result is Negative. Fact Sheet for Patients: SugarRoll.be Fact Sheet for Healthcare Providers: https://www.woods-mathews.com/ This test is not yet approved or cleared by the  [place] FDA and  has been authorized for detection and/or diagnosis of SARS-CoV-2 by FDA under an Emergency Use Authorization (EUA). This EUA will remain  in effect (meaning this test can be used) for the duration of the COVID-19 declaration under Section 56 4(b)(1) of the Act, 21 U.S.C. section 360bbb-3(b)(1), unless the authorization is terminated or revoked sooner. Performed at [facility], [place] [address], [place], [place] [address]   Surgical pcr screen     Status: None   Collection Time: 03/06/19 10:09 PM   Specimen: Nasal Mucosa; Nasal  Swab  Result Value Ref Range Status   MRSA, PCR NEGATIVE NEGATIVE Final   Staphylococcus aureus NEGATIVE NEGATIVE Final    Comment: (NOTE) The Xpert SA Assay (FDA approved for NASAL specimens in patients 33 years of age and older), is one component of a comprehensive surveillance program. It is not intended to diagnose infection nor to guide or monitor treatment. Performed at [facility], [place] [address], [place], [place] [address]     Radiology Studies: No results found.   Kearie Mennen T. [place]  If 7PM-7AM, please contact night-coverage [email or web address] Password [facility] 03/09/2019, 12:23 PM

## 2019-03-10 DIAGNOSIS — Z992 Dependence on renal dialysis: Secondary | ICD-10-CM | POA: Diagnosis not present

## 2019-03-10 DIAGNOSIS — E109 Type 1 diabetes mellitus without complications: Secondary | ICD-10-CM | POA: Diagnosis not present

## 2019-03-10 DIAGNOSIS — W19XXXA Unspecified fall, initial encounter: Secondary | ICD-10-CM | POA: Diagnosis not present

## 2019-03-10 DIAGNOSIS — S82831A Other fracture of upper and lower end of right fibula, initial encounter for closed fracture: Secondary | ICD-10-CM | POA: Diagnosis not present

## 2019-03-10 DIAGNOSIS — E1065 Type 1 diabetes mellitus with hyperglycemia: Secondary | ICD-10-CM | POA: Diagnosis not present

## 2019-03-10 DIAGNOSIS — I120 Hypertensive chronic kidney disease with stage 5 chronic kidney disease or end stage renal disease: Secondary | ICD-10-CM | POA: Diagnosis not present

## 2019-03-10 DIAGNOSIS — S82143A Displaced bicondylar fracture of unspecified tibia, initial encounter for closed fracture: Secondary | ICD-10-CM | POA: Diagnosis not present

## 2019-03-10 DIAGNOSIS — N189 Chronic kidney disease, unspecified: Secondary | ICD-10-CM | POA: Diagnosis not present

## 2019-03-10 DIAGNOSIS — S82141A Displaced bicondylar fracture of right tibia, initial encounter for closed fracture: Secondary | ICD-10-CM | POA: Diagnosis not present

## 2019-03-10 DIAGNOSIS — N186 End stage renal disease: Secondary | ICD-10-CM | POA: Diagnosis not present

## 2019-03-10 LAB — RENAL FUNCTION PANEL
Albumin: 2 g/dL — ABNORMAL LOW (ref 3.5–5.0)
Anion gap: 13 (ref 5–15)
BUN: 33 mg/dL — ABNORMAL HIGH (ref 6–20)
CO2: 28 mmol/L (ref 22–32)
Calcium: 8.2 mg/dL — ABNORMAL LOW (ref 8.9–10.3)
Chloride: 93 mmol/L — ABNORMAL LOW (ref 98–111)
Creatinine, Ser: 6.51 mg/dL — ABNORMAL HIGH (ref 0.61–1.24)
GFR calc Af Amer: 11 mL/min — ABNORMAL LOW (ref >60)
GFR calc non Af Amer: 9 mL/min — ABNORMAL LOW (ref >60)
Glucose, Bld: 188 mg/dL — ABNORMAL HIGH (ref 70–99)
Phosphorus: 5.7 mg/dL — ABNORMAL HIGH (ref 2.5–4.6)
Potassium: 4.2 mmol/L (ref 3.5–5.1)
Sodium: 134 mmol/L — ABNORMAL LOW (ref 135–145)

## 2019-03-10 LAB — CBC
HCT: 23.5 % — ABNORMAL LOW (ref 39.0–52.0)
Hemoglobin: 7.2 g/dL — ABNORMAL LOW (ref 13.0–17.0)
MCH: 27.8 pg (ref 26.0–34.0)
MCHC: 30.6 g/dL (ref 30.0–36.0)
MCV: 90.7 fL (ref 80.0–100.0)
Platelets: 222 10*3/uL (ref 150–400)
RBC: 2.59 MIL/uL — ABNORMAL LOW (ref 4.22–5.81)
RDW: 14.7 % (ref 11.5–15.5)
WBC: 6.1 10*3/uL (ref 4.0–10.5)
nRBC: 0 % (ref 0.0–0.2)

## 2019-03-10 LAB — GLUCOSE, CAPILLARY
Glucose-Capillary: 186 mg/dL — ABNORMAL HIGH (ref 70–99)
Glucose-Capillary: 187 mg/dL — ABNORMAL HIGH (ref 70–99)
Glucose-Capillary: 157 mg/dL — ABNORMAL HIGH (ref 70–99)
Glucose-Capillary: 142 mg/dL — ABNORMAL HIGH (ref 70–99)

## 2019-03-10 LAB — MAGNESIUM: Magnesium: 1.7 mg/dL (ref 1.7–2.4)

## 2019-03-10 LAB — HEPATITIS B SURFACE ANTIGEN: Hepatitis B Surface Ag: NEGATIVE — AB

## 2019-03-10 MED ORDER — INSULIN GLARGINE 100 UNIT/ML ~~LOC~~ SOLN
10.0000 [IU] | Freq: Every day | SUBCUTANEOUS | Status: DC
  Administered 2019-03-10 – 2019-03-11 (×2): 10 [IU] via SUBCUTANEOUS
  Filled 2019-03-10 (×4): qty 0.1

## 2019-03-10 MED ORDER — DARBEPOETIN ALFA 100 MCG/0.5ML IJ SOSY
100.0000 ug | PREFILLED_SYRINGE | INTRAMUSCULAR | Status: DC
  Administered 2019-03-11: 100 ug via INTRAVENOUS
  Filled 2019-03-10: qty 0.5

## 2019-03-10 MED ORDER — SODIUM CHLORIDE 0.9 % IV SOLN
125.0000 mg | INTRAVENOUS | Status: DC
  Administered 2019-03-11 – 2019-03-13 (×2): 125 mg via INTRAVENOUS
  Filled 2019-03-10 (×3): qty 10

## 2019-03-10 MED ORDER — CHLORHEXIDINE GLUCONATE CLOTH 2 % EX PADS
6.0000 | MEDICATED_PAD | Freq: Every day | CUTANEOUS | Status: DC
  Administered 2019-03-11 – 2019-03-13 (×3): 6 via TOPICAL

## 2019-03-10 NOTE — Clinical Social Work Note (Signed)
CSW spoke with patient to discuss dc planning. Patient states that he would like to speak with his parents to discuss dc plan and CSW follow up with him tomorrow. MD aware.  CSW will follow up with him tomorrow.

## 2019-03-10 NOTE — Progress Notes (Signed)
PROGRESS NOTE  Alex Morrison [facility] [id number] DOB: [birth year]   PCP: Haywood Pao, MD  Patient is from: Home.  Independently ambulates at baseline.  DOA: 03/06/2019 LOS: 4  Brief Narrative / Interim history: 49 y.o. male with history of ESRD on HD MWF, DM-1, HTN and recent hospitalization at [facility] from 2/9-2/15 with Streptococcus mitis bacteremia presenting with mechanical fall from loading dock about 4 feet high and subsequent right tibial plateau fracture and right distal fibula fracture.  Orthopedic surgery consulted, and planning surgical management of right tibial plateau fracture.  Nephrology consulted for HD in case he happens to be here on Monday. Patient underwent ORIF of right tibial plateau fracture and right distal fibula fracture on 03/06/2018 by Dr. Mardelle Matte.  Patient and family concerned about patient going home.   Subjective: No major events overnight or this morning.  No complaints.  Pain fairly controlled with current pain regimen.  Denies chest pain, dyspnea, GI or UTI symptoms.  Objective: Vitals:   03/09/19 1521 03/09/19 1958 03/10/19 0513 03/10/19 0806  BP: 138/84 (!) 158/78 (!) 143/71 (!) 150/78  Pulse: 91 96 90 90  Resp: 18 16 16 18   Temp: 98.5 F (36.9 C) 99.3 F (37.4 C) 99.4 F (37.4 C) 99.3 F (37.4 C)  TempSrc: Oral Oral Oral Oral  SpO2: 99% 97% 98% 98%  Weight:      Height:        Intake/Output Summary (Last 24 hours) at 03/10/2019 1116 Last data filed at 03/10/2019 0500 Gross per 24 hour  Intake 240 ml  Output 2640 ml  Net -2400 ml   Filed Weights   03/06/19 2028 03/09/19 0725 03/09/19 1130  Weight: 93.5 kg 95.7 kg 93 kg    Examination:  GENERAL: No acute distress.  Appears well.  HEENT: MMM.  Vision and hearing grossly intact.  NECK: Supple.  No apparent JVD.  RESP:  No IWOB. Good air movement bilaterally. CVS:  RRR. Heart sounds normal.  ABD/GI/GU: Bowel sounds present. Soft. Non tender.  MSK/EXT: RLE knee immobilizer, dressing  and Ace wrap.  Neurovascular intact in his toes. SKIN: no apparent skin lesion or wound NEURO: Awake, alert and oriented appropriately.  No apparent focal neuro deficit. PSYCH: Calm. Normal affect.  Procedures:  2/20-ORIF of right tibial plateau fracture and right distal fibula fracture on 03/07/2019 by Dr. Mardelle Matte.  Assessment & Plan: Mechanical fall from loading dock about 4 feet high Comminuted, displaced and depressed fracture lateral tibial plateau associated with hemarthrosis-noted on x-ray and CT. Displaced fracture of the distal right fibula-noted on x-ray. -ORIF of right tibial plateau fracture and right distal fibula fracture on 03/07/2019 by Dr. Mardelle Matte. -Pain control per orthopedic surgeon-pain seems to have been controlled on oral regimen. -Subcu heparin for VTE prophylaxis-ESRD patient -Per Ortho, NWB on RLE for 6 to 8 weeks, VTE ppx for a month and outpt follow up in 2 weeks -PT/OT eval  Uncontrolled DM-1: A1c 9.2%.  On Lantus 10 units nightly and SSI at home CBG (last 3)  Recent Labs    03/09/19 1615 03/09/19 2122 03/10/19 0650  GLUCAP 208* 134* 186*  -Continue SSI-moderate and NovoLog 4 units AC -Change Levemir to Lantus nightly -Further adjustment as appropriate.  ESRD on HD MWF/BMD:  -Nephrology on board.  Anemia of renal disease: Baseline Hgb 8-9> 8.9 (admit)> 7.5> 7.2.  Anemia panel consistent with both IDA and ACD.  Denies melena or hematochezia.  Hesitant about blood transfusion. -Continue monitoring -May need ESA and iron infusion.  Essential hypertension: BP fairly controlled. -Continue home amlodipine and as needed labetalol  Streptococcus mitis bacteremia-hospitalized for this at [facility] 2/9-2/15 and discharged on Ancef with HD that he completed on 2/19.  Bacteremia thought to be from HD cath.  HD cath replaced.  TTE reportedly negative for vegetation.  Constipation -Continue MiraLAX and Senokot               DVT  prophylaxis: Subcu heparin Code Status: Full code Family Communication: Patient and/or RN. Available if any question.  Discharge barrier: Therapy evaluation to determine appropriate disposition Patient is from: Home Final disposition: To be determined after therapy evaluation.  Patient and family interested in rehab  Consultants: Orthopedic surgery, nephrology   Microbiology summarized: [id number] negative.  Sch Meds:  Scheduled Meds: . amLODipine  10 mg Oral Daily  . Chlorhexidine Gluconate Cloth  6 each Topical Daily  . docusate sodium  100 mg Oral BID  . doxercalciferol  4 mcg Intravenous Q M,W,F-HD  . feeding supplement (NEPRO CARB STEADY)  237 mL Oral BID BM  . feeding supplement (PRO-STAT SUGAR FREE 64)  30 mL Oral BID  . heparin  5,000 Units Subcutaneous Q8H  . insulin aspart  0-15 Units Subcutaneous TID WC  . insulin aspart  0-5 Units Subcutaneous QHS  . insulin aspart  4 Units Subcutaneous TID WC  . insulin glargine  10 Units Subcutaneous QHS  . multivitamin  1 tablet Oral QHS  . senna  1 tablet Oral BID  . sevelamer carbonate  2,400 mg Oral BID WC   Continuous Infusions: . methocarbamol (ROBAXIN) IV     PRN Meds:.albuterol, bisacodyl, diphenhydrAMINE, HYDROmorphone (DILAUDID) injection, labetalol, methocarbamol **OR** methocarbamol (ROBAXIN) IV, metoCLOPramide **OR** metoCLOPramide (REGLAN) injection, ondansetron **OR** ondansetron (ZOFRAN) IV, oxyCODONE, oxyCODONE, polyethylene glycol, zolpidem  Antimicrobials: Anti-infectives (From admission, onward)   Start     Dose/Rate Route Frequency Ordered Stop   03/07/19 2300  ceFAZolin (ANCEF) IVPB 2g/100 mL premix     2 g 200 mL/hr over 30 Minutes Intravenous Every 6 hours 03/07/19 2113 03/08/19 1034   03/07/19 0600  ceFAZolin (ANCEF) IVPB 2g/100 mL premix  Status:  Discontinued     2 g 200 mL/hr over 30 Minutes Intravenous To Short Stay 03/06/19 2023 03/07/19 2110       I have personally reviewed the following labs  and images: CBC: Recent Labs  Lab 03/06/19 1800 03/06/19 1800 03/07/19 0334 03/08/19 0412 03/09/19 0430 03/09/19 0738 03/10/19 0251  WBC 8.7   < > 7.1 9.2 7.3 7.2 6.1  NEUTROABS 7.2  --   --   --   --   --   --   HGB 8.9*   < > 7.5* 7.3* 7.2* 7.3* 7.2*  HCT 28.7*   < > 23.9* 23.5* 23.8* 24.4* 23.5*  MCV 90.0   < > 89.5 89.7 91.9 93.1 90.7  PLT 318   < > 251 221 221 242 222   < > = values in this interval not displayed.   BMP &GFR Recent Labs  Lab 03/06/19 1800 03/07/19 0334 03/08/19 0412 03/09/19 0430 03/10/19 0251  NA 136 134* 137 137 134*  K 3.6 3.9 4.3 4.2 4.2  CL 95* 93* 98 94* 93*  CO2 29 29 28 28 28   GLUCOSE 218* 439* 219* 226* 188*  BUN 17 22* 31* 48* 33*  CREATININE 4.88* 5.72* 7.51* 9.46* 6.51*  CALCIUM 8.2* 7.8* 7.8* 8.2* 8.2*  MG  --   --  2.0  2.2 1.7  PHOS  --   --  5.8* 6.9* 5.7*   Estimated Creatinine Clearance: 16 mL/min (A) (by C-G formula based on SCr of 6.51 mg/dL (H)). Liver & Pancreas: Recent Labs  Lab 03/08/19 0412 03/09/19 0430 03/10/19 0251  ALBUMIN 2.1* 2.0* 2.0*   No results for input(s): LIPASE, AMYLASE in the last 168 hours. No results for input(s): AMMONIA in the last 168 hours. Diabetic: No results for input(s): HGBA1C in the last 72 hours. Recent Labs  Lab 03/09/19 0640 03/09/19 1224 03/09/19 1615 03/09/19 2122 03/10/19 0650  GLUCAP 224* 132* 208* 134* 186*   Cardiac Enzymes: No results for input(s): CKTOTAL, CKMB, CKMBINDEX, TROPONINI in the last 168 hours. No results for input(s): PROBNP in the last 8760 hours. Coagulation Profile: Recent Labs  Lab 03/06/19 1921  INR 1.0   Thyroid Function Tests: No results for input(s): TSH, T4TOTAL, FREET4, T3FREE, THYROIDAB in the last 72 hours. Lipid Profile: No results for input(s): CHOL, HDL, LDLCALC, TRIG, CHOLHDL, LDLDIRECT in the last 72 hours. Anemia Panel: No results for input(s): VITAMINB12, FOLATE, FERRITIN, TIBC, IRON, RETICCTPCT in the last 72 hours. Urine  analysis:    Component Value Date/Time   COLORURINE STRAW (A) 08/07/2018 2328   APPEARANCEUR CLEAR 08/07/2018 2328   LABSPEC 1.010 08/07/2018 2328   PHURINE 6.0 08/07/2018 2328   GLUCOSEU >=500 (A) 08/07/2018 2328   HGBUR SMALL (A) 08/07/2018 2328   BILIRUBINUR NEGATIVE 08/07/2018 2328   KETONESUR NEGATIVE 08/07/2018 2328   PROTEINUR 100 (A) 08/07/2018 2328   UROBILINOGEN 0.2 09/28/2013 1034   NITRITE NEGATIVE 08/07/2018 2328   LEUKOCYTESUR NEGATIVE 08/07/2018 2328   Sepsis Labs: Invalid input(s): PROCALCITONIN, [place]  Microbiology: Recent Results (from the past 240 hour(s))  SARS CORONAVIRUS 2 (TAT 6-24 HRS) Nasopharyngeal Nasopharyngeal Swab     Status: None   Collection Time: 03/06/19  4:39 PM   Specimen: Nasopharyngeal Swab  Result Value Ref Range Status   SARS Coronavirus 2 NEGATIVE NEGATIVE Final    Comment: (NOTE) SARS-CoV-2 target nucleic acids are NOT DETECTED. The SARS-CoV-2 RNA is generally detectable in upper and lower respiratory specimens during the acute phase of infection. Negative results do not preclude SARS-CoV-2 infection, do not rule out co-infections with other pathogens, and should not be used as the sole basis for treatment or other patient management decisions. Negative results must be combined with clinical observations, patient history, and epidemiological information. The expected result is Negative. Fact Sheet for Patients: SugarRoll.be Fact Sheet for Healthcare Providers: https://www.woods-mathews.com/ This test is not yet approved or cleared by the [place] FDA and  has been authorized for detection and/or diagnosis of SARS-CoV-2 by FDA under an Emergency Use Authorization (EUA). This EUA will remain  in effect (meaning this test can be used) for the duration of the COVID-19 declaration under Section 56 4(b)(1) of the Act, 21 U.S.C. section 360bbb-3(b)(1), unless the authorization is  terminated or revoked sooner. Performed at [facility], [place] [address] [place], [place] [address]   Surgical pcr screen     Status: None   Collection Time: 03/06/19 10:09 PM   Specimen: Nasal Mucosa; Nasal Swab  Result Value Ref Range Status   MRSA, PCR NEGATIVE NEGATIVE Final   Staphylococcus aureus NEGATIVE NEGATIVE Final    Comment: (NOTE) The Xpert SA Assay (FDA approved for NASAL specimens in patients 86 years of age and older), is one component of a comprehensive surveillance program. It is not intended to diagnose infection nor to guide  or monitor treatment. Performed at [facility], [place] [address] [place], [place] [address]     Radiology Studies: No results found.   Gauri Galvao T. [place]  If 7PM-7AM, please contact night-coverage [email or web address] Password [facility] 03/10/2019, 11:16 AM

## 2019-03-10 NOTE — Progress Notes (Signed)
Occupational Therapy Evaluation Patient Details Name: Alex Morrison MRN: [name] DOB: 1970/04/17 Today's Date: 03/10/2019    History of Present Illness 49 yo admitted after fall off loading dock at work with right fibula and tibial plateau fx s/p ORIF. PMHx: ESRD, DM, HTN   Clinical Impression   PTA, pt was living at home with his brother. 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 Pt reports his bedroom is upstairs, but he may be able to stay at his parent's house which is a single story and has a ramped entrance. Pt currently requires minguard assistance for functional mobility in the room. He demonstrates good adherence to non weight bearing precaution. He requires setupA to minguard assistance for ADL. Pt reports his parents would be able to provide setup assistance, but are not able to physically assist him. He also reports a wheelchair will fit throughout the house and in the bathroom. Therefor, he would need to be at supervision to modified independent level for transfers in order to safely discharge home with his parents. Pt is highly motivated to work with therapy and would benefit from continued OT services to maximize pt's independence with ADL and functional mobility.  At this time, recommend HHOT follow-up. Will continue to follow acutely.     Follow Up Recommendations  Home health OT;Supervision - Intermittent    Equipment Recommendations  3 in 1 bedside commode    Recommendations for Other Services       Precautions / Restrictions Precautions Precautions: Fall Required Braces or Orthoses: Knee Immobilizer - Right Knee Immobilizer - Right: On at all times Restrictions Weight Bearing Restrictions: Yes RLE Weight Bearing: Non weight bearing      Mobility Bed Mobility Overal bed mobility: Needs Assistance Bed Mobility: Sit to Supine     Supine to sit: Min assist Sit to supine: Min assist   General bed mobility comments: minA for  RLE management  Transfers Overall transfer level: Needs assistance Equipment used: Rolling walker (2 wheeled) Transfers: Sit to/from Stand Sit to Stand: Min guard         General transfer comment: cues for hand placement, RLE positioning and safety    Balance Overall balance assessment: Mild deficits observed, not formally tested                                         ADL either performed or assessed with clinical judgement   ADL Overall ADL's : Needs assistance/impaired Eating/Feeding: Set up;Sitting   Grooming: Set up;Sitting   Upper Body Bathing: Set up;Sitting   Lower Body Bathing: Min guard;Sit to/from stand   Upper Body Dressing : Set up;Sitting   Lower Body Dressing: Min guard;Sit to/from stand   Toilet Transfer: Min guard;Ambulation;RW Toilet Transfer Details (indicate cue type and reason): simulated in room Toileting- Clothing Manipulation and Hygiene: Min guard;Sit to/from stand       Functional mobility during ADLs: Min guard;Rolling walker General ADL Comments: minguard for safety;completed functional mobiltiy in the room;pt with good adherence to NWB precaution     Vision Baseline Vision/History: Wears glasses Wears Glasses: Reading only Patient Visual Report: No change from baseline Vision Assessment?: No apparent visual deficits Additional Comments: pt squinting L eye frequently during session, vision appears WNL, pt reports eye is dry     Perception     Praxis      Pertinent Vitals/Pain  Pain Assessment: 0-10 Pain Score: 6  Pain Location: RLE Pain Descriptors / Indicators: Aching;Guarding Pain Intervention(s): Limited activity within patient's tolerance;Monitored during session;Repositioned     Hand Dominance Right   Extremity/Trunk Assessment Upper Extremity Assessment Upper Extremity Assessment: Overall WFL for tasks assessed   Lower Extremity Assessment Lower Extremity Assessment: Defer to PT evaluation RLE  Deficits / Details: decreased ROM and strength post op and per order   Cervical / Trunk Assessment Cervical / Trunk Assessment: Normal   Communication Communication Communication: No difficulties   Cognition Arousal/Alertness: Awake/alert Behavior During Therapy: WFL for tasks assessed/performed Overall Cognitive Status: Within Functional Limits for tasks assessed                                     General Comments  vss    Exercises     Shoulder Instructions      Home Living Family/patient expects to be discharged to:: Private residence Living Arrangements: Other relatives Available Help at Discharge: Family;Available 24 hours/day Type of Home: House Home Access: Stairs to enter [employer] of Steps: 5 Entrance Stairs-Rails: Right Home Layout: Bed/bath upstairs;Two level     Bathroom Shower/Tub: Advertising copywriter: Yes   Home Equipment: None   Additional Comments: parents house is one level, ramp, walk in shower, shower seat, hand held shower head;pt reports wheelchair fits throughout the house and in the bathroom      Prior Functioning/Environment Level of Independence: Independent        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        OT Problem List: Decreased activity tolerance;Impaired balance (sitting and/or standing);Decreased knowledge of use of DME or AE;Decreased knowledge of precautions;Pain      OT Treatment/Interventions: Self-care/ADL training;Therapeutic exercise;DME and/or AE instruction;Therapeutic activities;Patient/family education;Balance training    OT Goals(Current goals can be found in the care plan section) Acute Rehab OT Goals Patient Stated Goal: be able to return to work OT Goal Formulation: With patient Time For Goal Achievement: 03/24/19 Potential to Achieve Goals: Good ADL Goals Pt Will Perform Grooming: with modified independence;sitting;standing Pt  Will Perform Lower Body Dressing: with modified independence;sit to/from stand Pt Will Transfer to Toilet: with modified independence;ambulating Pt Will Perform Tub/Shower Transfer: Shower transfer;with modified independence;3 in 1  OT Frequency: Min 2X/week   Barriers to D/C: Inaccessible home environment  pt's home with stairs to enter, he reports possiblity of staying at parents house       Co-evaluation              AM-PAC OT "6 Clicks" Daily Activity     Outcome Measure Help from another person eating meals?: A Little Help from another person taking care of personal grooming?: A Little Help from another person toileting, which includes using toliet, bedpan, or urinal?: A Little Help from another person bathing (including washing, rinsing, drying)?: A Little Help from another person to put on and taking off regular upper body clothing?: A Little Help from another person to put on and taking off regular lower body clothing?: A Little 6 Click Score: 18   End of Session Equipment Utilized During Treatment: Gait belt;Rolling walker Nurse Communication: Mobility status  Activity Tolerance: Patient tolerated treatment well Patient left: in bed;with call bell/phone within reach;with bed alarm set  OT Visit Diagnosis: Unsteadiness on feet (R26.81);Other abnormalities of gait and mobility (R26.89);Muscle weakness (generalized) (M62.81);Pain Pain -  Right/Left: Right Pain - part of body: Leg                Time: 1208-1222 OT Time Calculation (min): 14 min Charges:  OT General Charges $OT Visit: 1 Visit OT Evaluation $OT Eval Moderate Complexity: [place] OTR/L Acute Rehabilitation Services Office: [place] 03/10/2019, 2:05 PM

## 2019-03-10 NOTE — Progress Notes (Signed)
PT Cancellation Note  Patient Details Name: Alex Morrison MRN: [id number] DOB: 01/05/1971   Cancelled Treatment:    Reason Eval/Treat Not Completed: Patient declined, no reason specified(pt just received breakfast. Will return)   [name] [name] 03/10/2019, 8:29 AM  [name], PT Acute Rehabilitation Services Pager: [phone] Office: [phone]

## 2019-03-10 NOTE — Care Management (Cosign Needed)
    Durable Medical Equipment  (From admission, onward)         Start     Ordered   03/10/19 1616  For home use only DME standard manual wheelchair with seat cushion  Once    Comments: Patient suffers from right leg fracture which impairs their ability to perform daily activities like bathing and toileting in the home.  A walker will not resolve issue with performing activities of daily living. A wheelchair will allow patient to safely perform daily activities. Patient can safely propel the wheelchair in the home or has a caregiver who can provide assistance. Length of need 6 months . Accessories: elevating leg rests (ELRs), wheel locks, extensions and anti-tippers.   03/10/19 1616   03/10/19 1615  For home use only DME Walker rolling  Once    Question Answer Comment  Walker: With 5 Inch Wheels   Patient needs a walker to treat with the following condition Leg fracture, right      03/10/19 1616   03/10/19 1610  For home use only DME Bedside commode  Once    Comments: 3 in 1  Question:  Patient needs a bedside commode to treat with the following condition  Answer:  Leg fracture, right   03/10/19 1616

## 2019-03-10 NOTE — Progress Notes (Signed)
Subjective: 3 Days Post-Op s/p Procedure(s): OPEN REDUCTION INTERNAL FIXATION (ORIF) FIBULA FRACTURE Open Reduction Internal Fixation (Orif) Tibial Plateau   Patient is alert, oriented, laying in bed. Reports pain is moderate but improving.  Denies chest pain, SOB, Calf pain. No nausea/vomiting. No other complaints.  Objective:  PE: VITALS:   Vitals:   03/09/19 1130 03/09/19 1521 03/09/19 1958 03/10/19 0513  BP: 134/68 138/84 (!) 158/78 (!) 143/71  Pulse: 94 91 96 90  Resp: 18 18 16 16   Temp: 98.7 F (37.1 C) 98.5 F (36.9 C) 99.3 F (37.4 C) 99.4 F (37.4 C)  TempSrc: Oral Oral Oral Oral  SpO2: 98% 99% 97% 98%  Weight: 93 kg     Height:       General: Patient laying in bed, in no acute distress. RLE: posterior splint and knee immobilizer intact. Dressings in place with mild strike through at tibial dressing. Able to move all toes of right foot. Right foot warm and well perfused. Distal sensation intact. Compartments soft, compressible.  LABS  Results for orders placed or performed during the hospital encounter of 03/06/19 (from the past 24 hour(s))  CBC     Status: Abnormal   Collection Time: 03/09/19  7:38 AM  Result Value Ref Range   WBC 7.2 4.0 - 10.5 K/uL   RBC 2.62 (L) 4.22 - 5.81 MIL/uL   Hemoglobin 7.3 (L) 13.0 - 17.0 g/dL   HCT 24.4 (L) 39.0 - 52.0 %   MCV 93.1 80.0 - 100.0 fL   MCH 27.9 26.0 - 34.0 pg   MCHC 29.9 (L) 30.0 - 36.0 g/dL   RDW 14.9 11.5 - 15.5 %   Platelets 242 150 - 400 K/uL   nRBC 0.0 0.0 - 0.2 %  Glucose, capillary     Status: Abnormal   Collection Time: 03/09/19 12:24 PM  Result Value Ref Range   Glucose-Capillary 132 (H) 70 - 99 mg/dL  Glucose, capillary     Status: Abnormal   Collection Time: 03/09/19  4:15 PM  Result Value Ref Range   Glucose-Capillary 208 (H) 70 - 99 mg/dL  Glucose, capillary     Status: Abnormal   Collection Time: 03/09/19  9:22 PM  Result Value Ref Range   Glucose-Capillary 134 (H) 70 - 99 mg/dL   Renal function panel     Status: Abnormal   Collection Time: 03/10/19  2:51 AM  Result Value Ref Range   Sodium 134 (L) 135 - 145 mmol/L   Potassium 4.2 3.5 - 5.1 mmol/L   Chloride 93 (L) 98 - 111 mmol/L   CO2 28 22 - 32 mmol/L   Glucose, Bld 188 (H) 70 - 99 mg/dL   BUN 33 (H) 6 - 20 mg/dL   Creatinine, Ser 6.51 (H) 0.61 - 1.24 mg/dL   Calcium 8.2 (L) 8.9 - 10.3 mg/dL   Phosphorus 5.7 (H) 2.5 - 4.6 mg/dL   Albumin 2.0 (L) 3.5 - 5.0 g/dL   GFR calc non Af Amer 9 (L) >60 mL/min   GFR calc Af Amer 11 (L) >60 mL/min   Anion gap 13 5 - 15  CBC     Status: Abnormal   Collection Time: 03/10/19  2:51 AM  Result Value Ref Range   WBC 6.1 4.0 - 10.5 K/uL   RBC 2.59 (L) 4.22 - 5.81 MIL/uL   Hemoglobin 7.2 (L) 13.0 - 17.0 g/dL   HCT 23.5 (L) 39.0 - 52.0 %   MCV 90.7 80.0 -  100.0 fL   MCH 27.8 26.0 - 34.0 pg   MCHC 30.6 30.0 - 36.0 g/dL   RDW 14.7 11.5 - 15.5 %   Platelets 222 150 - 400 K/uL   nRBC 0.0 0.0 - 0.2 %  Magnesium     Status: None   Collection Time: 03/10/19  2:51 AM  Result Value Ref Range   Magnesium 1.7 1.7 - 2.4 mg/dL  Glucose, capillary     Status: Abnormal   Collection Time: 03/10/19  6:50 AM  Result Value Ref Range   Glucose-Capillary 186 (H) 70 - 99 mg/dL    No results found.  Assessment/Plan: Active Problems:   Tibial plateau fracture    3 Days Post-Op s/p Procedure(s): OPEN REDUCTION INTERNAL FIXATION (ORIF) FIBULA FRACTURE Open Reduction Internal Fixation (Orif) Tibial Plateau  Weightbearing:Non-weight bearing in RLE for 6-8 weeks post-operatively.Up with therapy Insicional and dressing care:PRN dressing changes Orthopedic device(s):Continue posterior splint and knee immobilizer. Ace-wrap in place to help with swelling. VTE prophylaxis:heparin while in hospital, spoke with Dr. Cyndia Skeeters and we'll plan on aspirin at discharge Pain control:continue current prn pain control regimen Follow - up plan:Plan to follow-up with Dr. Mardelle Matte 2 weeks after  discharge.  Disposition: TOC consult placed. Will await results from PT eval.   Contact information:   Weekdays 8-5:  Merlene Pulling, PA-C [phone]  After hours and holidays please check [email or web address] for group call information for [facility].  Ventura Bruns 03/10/2019, 7:36 AM

## 2019-03-10 NOTE — Progress Notes (Signed)
[facility] Progress Note   Subjective:   Seen in room - no overnight issues. No CP/dyspnea or leg pain. Per notes - for PT eval today to decide if needs placement. S/p HD yesterday without issues.  Objective Vitals:   03/09/19 1521 03/09/19 1958 03/10/19 0513 03/10/19 0806  BP: 138/84 (!) 158/78 (!) 143/71 (!) 150/78  Pulse: 91 96 90 90  Resp: 18 16 16 18   Temp: 98.5 F (36.9 C) 99.3 F (37.4 C) 99.4 F (37.4 C) 99.3 F (37.4 C)  TempSrc: Oral Oral Oral Oral  SpO2: 99% 97% 98% 98%  Weight:      Height:       Physical Exam General: Well appearing man, NAD Heart: RRR; no murmur Lungs: CTA anteriorly Abdomen: soft Extremities: R leg with large soft brace to mid-thigh, no LLE edema Dialysis Access: TDC + maturing L AVF + thrill   Additional Objective Labs: Basic Metabolic Panel: Recent Labs  Lab 03/08/19 0412 03/09/19 0430 03/10/19 0251  NA 137 137 134*  K 4.3 4.2 4.2  CL 98 94* 93*  CO2 28 28 28   GLUCOSE 219* 226* 188*  BUN 31* 48* 33*  CREATININE 7.51* 9.46* 6.51*  CALCIUM 7.8* 8.2* 8.2*  PHOS 5.8* 6.9* 5.7*   Liver Function Tests: Recent Labs  Lab 03/08/19 0412 03/09/19 0430 03/10/19 0251  ALBUMIN 2.1* 2.0* 2.0*   CBC: Recent Labs  Lab 03/06/19 1800 03/06/19 1800 03/07/19 0334 03/07/19 0334 03/08/19 0412 03/08/19 0412 03/09/19 0430 03/09/19 0738 03/10/19 0251  WBC 8.7   < > 7.1   < > 9.2   < > 7.3 7.2 6.1  NEUTROABS 7.2  --   --   --   --   --   --   --   --   HGB 8.9*   < > 7.5*   < > 7.3*   < > 7.2* 7.3* 7.2*  HCT 28.7*   < > 23.9*   < > 23.5*   < > 23.8* 24.4* 23.5*  MCV 90.0   < > 89.5  --  89.7  --  91.9 93.1 90.7  PLT 318   < > 251   < > 221   < > 221 242 222   < > = values in this interval not displayed.   Medications: . methocarbamol (ROBAXIN) IV     . amLODipine  10 mg Oral Daily  . Chlorhexidine Gluconate Cloth  6 each Topical Daily  . docusate sodium  100 mg Oral BID  . doxercalciferol  4 mcg Intravenous Q  M,W,F-HD  . feeding supplement (NEPRO CARB STEADY)  237 mL Oral BID BM  . feeding supplement (PRO-STAT SUGAR FREE 64)  30 mL Oral BID  . heparin  5,000 Units Subcutaneous Q8H  . insulin aspart  0-15 Units Subcutaneous TID WC  . insulin aspart  0-5 Units Subcutaneous QHS  . insulin aspart  4 Units Subcutaneous TID WC  . insulin glargine  10 Units Subcutaneous QHS  . multivitamin  1 tablet Oral QHS  . senna  1 tablet Oral BID  . sevelamer carbonate  2,400 mg Oral BID WC    Dialysis Orders: MWF at [facility] EDW94 kg,2K/2.25Ca, 4hr, Heparin 3000, LUA AVF insert 01/13/19 + L IJ cath - Hectoral 71mcg IV/HD  - Mircera 75 mcg IV q 2 weeks (last on 02/16/19)  Assessment/Plan: 1. R tibial plateau + R distal Fibular Fx: S/p ORIF on 03/07/2019 by Dr. Mardelle Matte. 2. ESRD:  Continue HD per MWF sched - next HD 2/24. 3. Hypertension/volume: BP stable - no edema. 4. Anemia: Hgb 7.2. Tsat 11% (03/06/19) - IV iron was held d/t recent bacteremia, will resume + give Aranesp tomorrow. 5. Metabolic bone disease: Ca ok, Phos ^ (improving) - continue renvela as binder. 6. Nutrition: Alb low - continue nepro /pro stat 7. DM type 1 -per admit 8. Recent Strep bacteremia admit - felt d/t PD cath, removed, s/p Cefazolin thru 03/06/19  Veneta Penton, PA-C 03/10/2019, 11:10 AM  [facility] Pager: [phone]

## 2019-03-10 NOTE — Evaluation (Signed)
Physical Therapy Evaluation Patient Details Name: Alex Morrison MRN: [name] DOB: [birth year] Today's Date: 03/10/2019   History of Present Illness  49 yo admitted after fall off loading dock at work with right fibula and tibial plateau fx s/p ORIF. PMHx: ESRD, DM, HTN  Clinical Impression  Pt pleasant and willing to mobilize. 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 Parents live in single story home with ramp. Pt was able to transfer and hop 6' with min-minguard assist and reports parents may be able to provide sufficient assist and allow him to stay with them. Pt would need transportation via wheelchair to HD and will benefit from [place]. Pt with decreased gait, transfers, mobility and ROM who will benefit from acute therapy to maximize mobility, safety and function. Pt educated on KI positioning and use as well as need for skin checks.     Follow Up Recommendations Home health PT;Supervision/Assistance - 24 hour    Equipment Recommendations  Rolling walker with 5" wheels;Wheelchair (measurements PT);3in1 (PT)    Recommendations for Other Services       Precautions / Restrictions Precautions Precautions: Fall Required Braces or Orthoses: Knee Immobilizer - Right Knee Immobilizer - Right: On at all times Restrictions Weight Bearing Restrictions: Yes RLE Weight Bearing: Non weight bearing      Mobility  Bed Mobility Overal bed mobility: Needs Assistance Bed Mobility: Supine to Sit     Supine to sit: Min assist     General bed mobility comments: assist to move RLE to EOB with cues for sequence and increased time  Transfers Overall transfer level: Needs assistance   Transfers: Sit to/from Stand Sit to Stand: Min guard         General transfer comment: cues for hand placement, RLE positioning and safety  Ambulation/Gait Ambulation/Gait assistance: Min guard Gait Distance (Feet): 6 Feet Assistive device: Rolling  walker (2 wheeled) Gait Pattern/deviations: Step-to pattern   Gait velocity interpretation: >2.62 ft/sec, indicative of community ambulatory General Gait Details: cues for sequence with pt fatiguing quickly with hopping but maintaining NWB RLE throughout  Stairs            Wheelchair Mobility    Modified Rankin (Stroke Patients Only)       Balance Overall balance assessment: Mild deficits observed, not formally tested                                           Pertinent Vitals/Pain Pain Assessment: 0-10 Pain Score: 4  Pain Location: RLE Pain Descriptors / Indicators: Aching;Guarding Pain Intervention(s): Limited activity within patient's tolerance;Monitored during session;Premedicated before session;Repositioned    Home Living Family/patient expects to be discharged to:: Private residence Living Arrangements: Other relatives Available Help at Discharge: Family;Available 24 hours/day Type of Home: House Home Access: Stairs to enter   [employer] of Steps: 5 Home Layout: Bed/bath upstairs;Two level Home Equipment: None Additional Comments: parents house is one level, ramp, walk in shower, shower seat, hand held shower head    Prior Function Level of Independence: Independent         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     Hand Dominance        Extremity/Trunk Assessment   Upper Extremity Assessment Upper Extremity Assessment: Overall WFL for tasks assessed    Lower Extremity Assessment Lower Extremity Assessment: RLE  deficits/detail RLE Deficits / Details: decreased ROM and strength post op and per order    Cervical / Trunk Assessment Cervical / Trunk Assessment: Normal  Communication   Communication: No difficulties  Cognition Arousal/Alertness: Awake/alert Behavior During Therapy: WFL for tasks assessed/performed Overall Cognitive Status: Within Functional Limits for tasks assessed                                         General Comments      Exercises     Assessment/Plan    PT Assessment Patient needs continued PT services  PT Problem List Decreased strength;Decreased mobility;Decreased range of motion;Decreased activity tolerance;Decreased balance;Decreased knowledge of use of DME;Pain       PT Treatment Interventions Gait training;Functional mobility training;Therapeutic activities;Patient/family education;DME instruction;Therapeutic exercise    PT Goals (Current goals can be found in the Care Plan section)  Acute Rehab PT Goals Patient Stated Goal: be able to return to work PT Goal Formulation: With patient Time For Goal Achievement: 03/24/19 Potential to Achieve Goals: Good    Frequency Min 5X/week   Barriers to discharge        Co-evaluation               AM-PAC PT "6 Clicks" Mobility  Outcome Measure Help needed turning from your back to your side while in a flat bed without using bedrails?: A Little Help needed moving from lying on your back to sitting on the side of a flat bed without using bedrails?: A Little Help needed moving to and from a bed to a chair (including a wheelchair)?: A Little Help needed standing up from a chair using your arms (e.g., wheelchair or bedside chair)?: A Little Help needed to walk in hospital room?: A Little Help needed climbing 3-5 steps with a railing? : A Lot 6 Click Score: 17    End of Session Equipment Utilized During Treatment: Gait belt;Right knee immobilizer Activity Tolerance: Patient tolerated treatment well Patient left: in chair;with call bell/phone within reach;with chair alarm set Nurse Communication: Mobility status;Precautions;Weight bearing status PT Visit Diagnosis: Other abnormalities of gait and mobility (R26.89);Difficulty in walking, not elsewhere classified (R26.2)    Time: [id number] PT Time Calculation (min) (ACUTE ONLY): 25 min   Charges:   PT Evaluation $PT Eval Moderate Complexity: 1  Mod PT Treatments $Therapeutic Activity: 8-22 mins        Tymeshia Awan P, PT Acute Rehabilitation Services Pager: [phone] Office: [phone]   Sandy Salaam Tymeir [place] 03/10/2019, 12:23 PM

## 2019-03-10 NOTE — Progress Notes (Signed)
Renal Navigator met with patient to see how he is doing after leg fracture and recent surgery. Patient presents with flat affect, which is consistent with his affect the last time Navigator met with patient (July 2020). Patient states the pain is bearable, but that he is worried about where he will recover and feels he may need an "assisted living facility," or may go to his parents' home. It sounds as though his parents are not able to provide much assistance, which concerns patient. Navigator asked patient if he needs any documentation for work regarding his time out. 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 He reports that he was driving himself to HD, first shift, and does not know how he will get to HD at this point, but that he also is not sure where he will be discharging to yet. Navigator states that we will need to consider transportation options, though patient lives in [place], and frankly, Navigator is unsure what the transportation resources are and will need some time to research this. Renal Navigator notified CSW/S. Wicker of the conversation with patient and potential barriers to discharge. Renal Navigator will follow up tomorrow, 03/11/19.  Alphonzo Cruise, [place] Renal Navigator [phone]

## 2019-03-10 NOTE — Progress Notes (Signed)
Inpatient Diabetes Program Recommendations  AACE/ADA: New Consensus Statement on Inpatient Glycemic Control (2015)  Target Ranges:  Prepandial:   less than 140 mg/dL      Peak postprandial:   less than 180 mg/dL (1-2 hours)      Critically ill patients:  140 - 180 mg/dL   Lab Results  Component Value Date   GLUCAP 186 (H) 03/10/2019   HGBA1C 9.2 (H) 03/06/2019    Review of Glycemic Control Results for SULAIMAN, FINTEL (MRN [name]) as of 03/10/2019 10:41  Ref. Range 03/09/2019 06:40 03/09/2019 12:24 03/09/2019 16:15 03/09/2019 21:22 03/10/2019 06:50  Glucose-Capillary Latest Ref Range: 70 - 99 mg/dL 224 (H) 132 (H) 208 (H) 134 (H) 186 (H)   Diabetes history:  DM 1 Outpatient Diabetes medications:  Novolog 3-9 units tid with  Meals Lantus 10 units q HS Current orders for Inpatient glycemic control:  Novolog moderate tid with meals and HS Novolog 4 units tid with meals Levemir 7 units bid  Inpatient Diabetes Program Recommendations:    Per RN, patient has been refusing AM doses of Levemir and Novolog meal coverage.   Notified MD.  Orders updated.  Will follow.   Thanks Adah Perl, RN, BC-ADM Inpatient Diabetes Coordinator Pager [phone] (8a-5p)

## 2019-03-11 LAB — GLUCOSE, CAPILLARY
Glucose-Capillary: 114 mg/dL — ABNORMAL HIGH (ref 70–99)
Glucose-Capillary: 115 mg/dL — ABNORMAL HIGH (ref 70–99)
Glucose-Capillary: 244 mg/dL — ABNORMAL HIGH (ref 70–99)
Glucose-Capillary: 233 mg/dL — ABNORMAL HIGH (ref 70–99)

## 2019-03-11 LAB — CBC
HCT: 23.3 % — ABNORMAL LOW (ref 39.0–52.0)
Hemoglobin: 7.3 g/dL — ABNORMAL LOW (ref 13.0–17.0)
MCH: 27.4 pg (ref 26.0–34.0)
MCHC: 31.3 g/dL (ref 30.0–36.0)
MCV: 87.6 fL (ref 80.0–100.0)
Platelets: 228 10*3/uL (ref 150–400)
RBC: 2.66 MIL/uL — ABNORMAL LOW (ref 4.22–5.81)
RDW: 14.6 % (ref 11.5–15.5)
WBC: 6.6 10*3/uL (ref 4.0–10.5)
nRBC: 0 % (ref 0.0–0.2)

## 2019-03-11 LAB — RENAL FUNCTION PANEL
Albumin: 1.9 g/dL — ABNORMAL LOW (ref 3.5–5.0)
Anion gap: 15 (ref 5–15)
BUN: 64 mg/dL — ABNORMAL HIGH (ref 6–20)
CO2: 27 mmol/L (ref 22–32)
Calcium: 8.1 mg/dL — ABNORMAL LOW (ref 8.9–10.3)
Chloride: 94 mmol/L — ABNORMAL LOW (ref 98–111)
Creatinine, Ser: 9.36 mg/dL — ABNORMAL HIGH (ref 0.61–1.24)
GFR calc Af Amer: 7 mL/min — ABNORMAL LOW (ref >60)
GFR calc non Af Amer: 6 mL/min — ABNORMAL LOW (ref >60)
Glucose, Bld: 126 mg/dL — ABNORMAL HIGH (ref 70–99)
Phosphorus: 7.2 mg/dL — ABNORMAL HIGH (ref 2.5–4.6)
Potassium: 4.4 mmol/L (ref 3.5–5.1)
Sodium: 136 mmol/L (ref 135–145)

## 2019-03-11 LAB — MAGNESIUM: Magnesium: 1.7 mg/dL (ref 1.7–2.4)

## 2019-03-11 MED ORDER — HEPARIN SODIUM (PORCINE) 1000 UNIT/ML IJ SOLN
INTRAMUSCULAR | Status: AC
  Administered 2019-03-11: 4000 [IU]
  Filled 2019-03-11: qty 4

## 2019-03-11 MED ORDER — DOXERCALCIFEROL 4 MCG/2ML IV SOLN
INTRAVENOUS | Status: AC
  Filled 2019-03-11: qty 2

## 2019-03-11 MED ORDER — SEVELAMER CARBONATE 800 MG PO TABS
2400.0000 mg | ORAL_TABLET | Freq: Three times a day (TID) | ORAL | Status: DC
  Administered 2019-03-11 – 2019-03-13 (×6): 2400 mg via ORAL
  Filled 2019-03-11 (×6): qty 3

## 2019-03-11 MED ORDER — OXYCODONE HCL 5 MG PO TABS: 5.0000 mg | ORAL_TABLET | ORAL | 0 refills | Status: DC | PRN

## 2019-03-11 MED ORDER — SENNOSIDES-DOCUSATE SODIUM 8.6-50 MG PO TABS: 1.0000 | ORAL_TABLET | Freq: Two times a day (BID) | ORAL | 0 refills | Status: DC | PRN

## 2019-03-11 MED ORDER — ASPIRIN EC 81 MG PO TBEC: 81.0000 mg | DELAYED_RELEASE_TABLET | Freq: Two times a day (BID) | ORAL | 0 refills | Status: AC

## 2019-03-11 MED ORDER — DARBEPOETIN ALFA 100 MCG/0.5ML IJ SOSY
PREFILLED_SYRINGE | INTRAMUSCULAR | Status: AC
  Filled 2019-03-11: qty 0.5

## 2019-03-11 MED ORDER — SENNA-DOCUSATE SODIUM 8.6-50 MG PO TABS: 2.0000 | ORAL_TABLET | Freq: Every day | ORAL | 1 refills | Status: DC

## 2019-03-11 MED ORDER — INSULIN ASPART 100 UNIT/ML ~~LOC~~ SOLN
2.0000 [IU] | Freq: Three times a day (TID) | SUBCUTANEOUS | Status: DC
  Administered 2019-03-11 – 2019-03-12 (×3): 2 [IU] via SUBCUTANEOUS

## 2019-03-11 MED FILL — oxyCODONE HCL 5 MG TABS: 5 | 5 days supply | Qty: 30 | Fill #0

## 2019-03-11 MED FILL — SENEXON-S 8.6-50 MG TABS: 8.6-50 | 15 days supply | Qty: 30 | Fill #0

## 2019-03-11 MED FILL — ASPIRIN 81 MG TBEC: 81 | 30 days supply | Qty: 60 | Fill #0

## 2019-03-11 NOTE — TOC Initial Note (Signed)
Transition of Care [facility]) - Initial/Assessment Note    Patient Details  Name: Alex Morrison MRN: [name] Date of Birth: 07-05-1970  [facility]) CM/SW Contact:    Atilano Median, LCSW Phone Number: 03/11/2019, 1:54 PM  Clinical Narrative:                 Admitted with history of ESRD on HD MWF, DM-1, HTN and recent hospitalization at [facility] from 2/9-2/15 with Streptococcus mitis bacteremia discharged on Ancef with HD.he completed the morning of admission.    CSW spoke with patient to discuss dispo planning. PT recommending 24 hr supervision with NWB status. However, patient states that his home has several steps that he must climb just to get inside. He also does not have anyone who would be able to transport him to and from HD sessions.   Patient is amenable to SNF at this time due to limited to no support at home. CSW given permission to fax information out for review.   TOC will continue to follow to assist with dispo planning.   Expected Discharge Plan: Skilled Nursing Facility Barriers to Discharge: Unsafe home situation   Patient Goals and CMS Choice Patient states their goals for this hospitalization and ongoing recovery are:: be able to return home to get back to my regular scheduled program CMS Medicare.gov Compare Post Acute Care list provided to:: Patient Choice offered to / list presented to : Patient  Expected Discharge Plan and Services Expected Discharge Plan: [place] In-house Referral: Clinical Social Work   Post Acute Care Choice: [place] Living arrangements for the past 2 months: Apartment Expected Discharge Date: 03/11/19                                    Prior Living Arrangements/Services Living arrangements for the past 2 months: Apartment Lives with:: Self, Siblings   Do you feel safe going back to the place where you live?: No   unable to ambulate the stairs; unable to safely  drive to HD sessions  Need for Family Participation in Patient Care: Yes (Comment) Care giver support system in place?: No (comment)      Activities of Daily Living Home Assistive Devices/Equipment: None ADL Screening (condition at time of admission) Patient's cognitive ability adequate to safely complete daily activities?: Yes Is the patient deaf or have difficulty hearing?: No Does the patient have difficulty seeing, even when wearing glasses/contacts?: No Does the patient have difficulty concentrating, remembering, or making decisions?: No Patient able to express need for assistance with ADLs?: Yes Does the patient have difficulty dressing or bathing?: No Independently performs ADLs?: Yes (appropriate for developmental age) Does the patient have difficulty walking or climbing stairs?: Yes Weakness of Legs: Right(new fx) Weakness of Arms/Hands: None  Permission Sought/Granted   Permission granted to share information with : Yes, Verbal Permission Granted  Share Information with NAME: care coordination agencies           Emotional Assessment       Orientation: : Oriented to Self, Oriented to Place, Oriented to  Time, Oriented to Situation      Admission diagnosis:  Tibial plateau fracture [S82.143A] Fall, initial encounter [W19.XXXA] Closed fracture of right tibial plateau, initial encounter [S82.141A] Closed fracture of distal end of right fibula, unspecified fracture morphology, initial encounter [id number] Patient Active Problem List   Diagnosis Date Noted  .  Tibial plateau fracture 03/06/2019  . ESRD (end stage renal disease) ([place]) 08/08/2018  . HTN (hypertension) 08/08/2018  . SBO (small bowel obstruction) ([place]) 08/08/2018  . Periumbilical hernia [id number]  . Abdominal hernia as complication of peritoneal dialysis 08/08/2018  . Psoriasis 09/29/2013  . Acute gangrenous appendicitis with perforation and peritonitis 09/27/2013  . Type 1 diabetes mellitus ([place])  09/27/2013  . Acute kidney injury ([place]) 09/27/2013  . DM (diabetes mellitus) type 2, uncontrolled, with ketoacidosis ([place]) 07/17/2012  . DKA (diabetic ketoacidosis) ([place]) 07/16/2012  . Perirectal abscess 07/14/2012   PCP:  Haywood Pao, MD Pharmacy:   [id number] - , [place] [place] [address] Phone: [phone] Fax: [place], [place] [address] [place] [address] Phone: [phone] Fax: [phone]     Social Determinants of Health (SDOH) Interventions    Readmission Risk Interventions No flowsheet data found.

## 2019-03-11 NOTE — Progress Notes (Signed)
[facility] Progress Note   Subjective:  Seen in HD - 1.5L net UF and tolerating without issues. No CP, dyspnea, or leg pains. See mult case manager and SW notes - looks like needs ongoing dispo planning, with transportation to HD, etc.  Objective Vitals:   03/11/19 0730 03/11/19 0800 03/11/19 0830 03/11/19 0900  BP: (!) 147/72 135/65 132/71 (!) 157/73  Pulse: 90 89 89 94  Resp: 15 16 15    Temp:      TempSrc:      SpO2:      Weight:      Height:       Physical Exam General:Well appearing man, NAD Heart:RRR; no murmur Lungs:CTA anteriorly Abdomen:soft Extremities:R leg with large soft brace to mid-thigh, no LLE edema Dialysis Access:TDC + maturing L AVF + thrill   Additional Objective Labs: Basic Metabolic Panel: Recent Labs  Lab 03/09/19 0430 03/10/19 0251 03/11/19 0242  NA 137 134* 136  K 4.2 4.2 4.4  CL 94* 93* 94*  CO2 28 28 27   GLUCOSE 226* 188* 126*  BUN 48* 33* 64*  CREATININE 9.46* 6.51* 9.36*  CALCIUM 8.2* 8.2* 8.1*  PHOS 6.9* 5.7* 7.2*   Liver Function Tests: Recent Labs  Lab 03/09/19 0430 03/10/19 0251 03/11/19 0242  ALBUMIN 2.0* 2.0* 1.9*   CBC: Recent Labs  Lab 03/06/19 1800 03/07/19 0334 03/08/19 0412 03/08/19 0412 03/09/19 0430 03/09/19 0430 03/09/19 0738 03/10/19 0251 03/11/19 0242  WBC 8.7   < > 9.2   < > 7.3   < > 7.2 6.1 6.6  NEUTROABS 7.2  --   --   --   --   --   --   --   --   HGB 8.9*   < > 7.3*   < > 7.2*   < > 7.3* 7.2* 7.3*  HCT 28.7*   < > 23.5*   < > 23.8*   < > 24.4* 23.5* 23.3*  MCV 90.0   < > 89.7  --  91.9  --  93.1 90.7 87.6  PLT 318   < > 221   < > 221   < > 242 222 228   < > = values in this interval not displayed.   Medications: . ferric gluconate (FERRLECIT/NULECIT) IV 125 mg (03/11/19 0924)  . methocarbamol (ROBAXIN) IV     . amLODipine  10 mg Oral Daily  . Chlorhexidine Gluconate Cloth  6 each Topical Q0600  . Darbepoetin Alfa      . darbepoetin (ARANESP) injection - DIALYSIS   100 mcg Intravenous Q Wed-HD  . docusate sodium  100 mg Oral BID  . doxercalciferol      . doxercalciferol  4 mcg Intravenous Q M,W,F-HD  . feeding supplement (NEPRO CARB STEADY)  237 mL Oral BID BM  . feeding supplement (PRO-STAT SUGAR FREE 64)  30 mL Oral BID  . heparin      . heparin  5,000 Units Subcutaneous Q8H  . insulin aspart  0-15 Units Subcutaneous TID WC  . insulin aspart  0-5 Units Subcutaneous QHS  . insulin aspart  4 Units Subcutaneous TID WC  . insulin glargine  10 Units Subcutaneous QHS  . multivitamin  1 tablet Oral QHS  . senna  1 tablet Oral BID  . sevelamer carbonate  2,400 mg Oral BID WC    Dialysis Orders: MWF at [facility] EDW94 kg,2K/2.25Ca,4hr,Heparin 3000,LUA AVF insert 01/13/19+ L IJ cath -Hectoral8mcg IV/HD -Mircera 75 mcgIVq 2 weeks (last  on 02/16/19)  Assessment/Plan: 1. R tibial plateau + R distal Fibular Fx: S/pORIF on 03/07/2019 by Dr. Mardelle Matte. 2. ESRD: Continue HD per MWF sched - HD today, 1.5L UF. 3. Hypertension/volume: BP stable - no edema. 4. Anemia: Hgb 7.3. Tsat11% (03/06/19)- IV iron was held d/t recent bacteremia, will resume + give Aranesp with HD today. 5. Metabolic bone disease: Ca ok, Phos ^ - continue Renvela as binder. 6. Nutrition: Alb low - continuenepro /pro stat 7. DM type 1 - per admit 8. Recent Strep bacteremiaadmit - felt d/t PD cath, removed, s/p Cefazolin thru 03/06/19   Veneta Penton, PA-C 03/11/2019, 9:25 AM  [facility] Pager: [phone]

## 2019-03-11 NOTE — Progress Notes (Signed)
PROGRESS NOTE  Alex Morrison [id number] DOB: 04-08-1970   PCP: Haywood Pao, MD  Patient is from: Home.  Independently ambulates at baseline.  DOA: 03/06/2019 LOS: 5  Brief Narrative / Interim history: 49 y.o. male with history of ESRD on HD MWF, DM-1, HTN and recent hospitalization at [facility] from 2/9-2/15 with Streptococcus mitis bacteremia presenting with mechanical fall from loading dock about 4 feet high and subsequent right tibial plateau fracture and right distal fibula fracture.  Orthopedic surgery consulted, and planning surgical management of right tibial plateau fracture.  Nephrology consulted for HD in case he happens to be here on Monday. Patient underwent ORIF of right tibial plateau fracture and right distal fibula fracture on 03/06/2018 by Dr. Mardelle Matte.  Patient will be nonweightbearing on RLE for 6 to 8 weeks.  Patient and family concerned about patient going home. Now waiting on SNF with ability to transport patient to HD.   Subjective: No major events overnight of this morning.  No complaints.  Pain fairly controlled on oral pain medications.  He did not require IV pain medications.  Objective: Vitals:   03/11/19 1030 03/11/19 1100 03/11/19 1126 03/11/19 1240  BP: 123/63 114/61 137/68 108/61  Pulse: 89 93 96 96  Resp: 16  17 18   Temp:   98.2 F (36.8 C) 99.3 F (37.4 C)  TempSrc:   Oral Oral  SpO2:    99%  Weight:   90.9 kg   Height:        Intake/Output Summary (Last 24 hours) at 03/11/2019 1351 Last data filed at 03/11/2019 1126 Gross per 24 hour  Intake 240 ml  Output 1500 ml  Net -1260 ml   Filed Weights   03/09/19 1130 03/11/19 0720 03/11/19 1126  Weight: 93 kg 92.6 kg 90.9 kg    Examination:  GENERAL: No acute distress.  Appears well.  On HD. HEENT: MMM.  Vision and hearing grossly intact.  NECK: Supple.  No apparent JVD.  RESP:  No IWOB. Good air movement bilaterally. CVS:  RRR. Heart sounds normal.  ABD/GI/GU: Bowel sounds present.  Soft. Non tender.  MSK/EXT: RLE in knee immobilizer, dressing and Ace wrap.  Neurovascular intact in his toes. SKIN: no apparent skin lesion or wound NEURO: Awake, alert and oriented appropriately.  No apparent focal neuro deficit. PSYCH: Calm. Normal affect.  Procedures:  2/20-ORIF of right tibial plateau fracture and right distal fibula fracture on 03/07/2019 by Dr. Mardelle Matte.  Assessment & Plan: Mechanical fall from loading dock about 4 feet high Comminuted, displaced and depressed fracture lateral tibial plateau associated with hemarthrosis Displaced fracture of the distal right fibula-noted on x-ray. -ORIF of right tibial plateau fracture and right distal fibula fracture on 03/07/2019 by Dr. Mardelle Matte. -Pain control per orthopedic surgeon-pain seems to have been controlled on oral regimen. -Subcu heparin for VTE prophylaxis-ASA on discharge. -Per Ortho, NWB on RLE for 6 to 8 weeks, VTE ppx for a month and outpt follow up in 2 weeks -PT/OT  Uncontrolled DM-1: A1c 9.2%.  On Lantus 10 units nightly and SSI at home CBG (last 3)  Recent Labs    03/10/19 2024 03/11/19 0616 03/11/19 1239  GLUCAP 142* 114* 115*  -Continue SSI-moderate, Lantus 10 units nightly -Decrease NovoLog 4 to 2 units AC -Further adjustment as appropriate.  ESRD on HD MWF/BMD:  -Nephrology on board.  Anemia of renal disease: Baseline Hgb 8-9> 8.9 (admit)> 7.5> 7.2> 7.3.  Anemia panel consistent with both IDA and ACD.  Denies melena  or hematochezia.  Hesitant about blood transfusion. -Monitor intermittently -ESA and iron infusion per nephrology.  Essential hypertension: BP fairly controlled. -Continue home amlodipine and as needed labetalol  Streptococcus mitis bacteremia-hospitalized for this at [facility] 2/9[facility]2/15 and discharged on Ancef with HD that he completed on 2/19.  Bacteremia thought to be from HD cath.  HD cath replaced.  TTE reportedly negative for vegetation.  Constipation -Continue  MiraLAX and Senokot               DVT prophylaxis: Subcu heparin Code Status: Full code Family Communication: Patient and/or RN. Available if any question.  Discharge barrier: Safe disposition, which would be SNF with ability to transport for HD Patient is from: Home Final disposition: SNF  Consultants: Orthopedic surgery, nephrology   Microbiology summarized: [id number] negative.  Sch Meds:  Scheduled Meds: . amLODipine  10 mg Oral Daily  . Chlorhexidine Gluconate Cloth  6 each Topical Q0600  . Darbepoetin Alfa      . darbepoetin (ARANESP) injection - DIALYSIS  100 mcg Intravenous Q Wed-HD  . docusate sodium  100 mg Oral BID  . doxercalciferol      . doxercalciferol  4 mcg Intravenous Q M,W,F-HD  . feeding supplement (NEPRO CARB STEADY)  237 mL Oral BID BM  . feeding supplement (PRO-STAT SUGAR FREE 64)  30 mL Oral BID  . heparin  5,000 Units Subcutaneous Q8H  . insulin aspart  0-15 Units Subcutaneous TID WC  . insulin aspart  0-5 Units Subcutaneous QHS  . insulin aspart  4 Units Subcutaneous TID WC  . insulin glargine  10 Units Subcutaneous QHS  . multivitamin  1 tablet Oral QHS  . senna  1 tablet Oral BID  . sevelamer carbonate  2,400 mg Oral TID WC   Continuous Infusions: . ferric gluconate (FERRLECIT/NULECIT) IV Stopped (03/11/19 1024)  . methocarbamol (ROBAXIN) IV     PRN Meds:.albuterol, bisacodyl, diphenhydrAMINE, HYDROmorphone (DILAUDID) injection, labetalol, methocarbamol **OR** methocarbamol (ROBAXIN) IV, metoCLOPramide **OR** metoCLOPramide (REGLAN) injection, ondansetron **OR** ondansetron (ZOFRAN) IV, oxyCODONE, oxyCODONE, polyethylene glycol, zolpidem  Antimicrobials: Anti-infectives (From admission, onward)   Start     Dose/Rate Route Frequency Ordered Stop   03/07/19 2300  ceFAZolin (ANCEF) IVPB 2g/100 mL premix     2 g 200 mL/hr over 30 Minutes Intravenous Every 6 hours 03/07/19 2113 03/08/19 1034   03/07/19 0600  ceFAZolin (ANCEF) IVPB 2g/100 mL  premix  Status:  Discontinued     2 g 200 mL/hr over 30 Minutes Intravenous To Short Stay 03/06/19 2023 03/07/19 2110       I have personally reviewed the following labs and images: CBC: Recent Labs  Lab 03/06/19 1800 03/07/19 0334 03/08/19 0412 03/09/19 0430 03/09/19 0738 03/10/19 0251 03/11/19 0242  WBC 8.7   < > 9.2 7.3 7.2 6.1 6.6  NEUTROABS 7.2  --   --   --   --   --   --   HGB 8.9*   < > 7.3* 7.2* 7.3* 7.2* 7.3*  HCT 28.7*   < > 23.5* 23.8* 24.4* 23.5* 23.3*  MCV 90.0   < > 89.7 91.9 93.1 90.7 87.6  PLT 318   < > 221 221 242 222 228   < > = values in this interval not displayed.   BMP &GFR Recent Labs  Lab 03/07/19 0334 03/08/19 0412 03/09/19 0430 03/10/19 0251 03/11/19 0242  NA 134* 137 137 134* 136  K 3.9 4.3 4.2 4.2 4.4  CL 93* 98  94* 93* 94*  CO2 29 28 28 28 27   GLUCOSE 439* 219* 226* 188* 126*  BUN 22* 31* 48* 33* 64*  CREATININE 5.72* 7.51* 9.46* 6.51* 9.36*  CALCIUM 7.8* 7.8* 8.2* 8.2* 8.1*  MG  --  2.0 2.2 1.7 1.7  PHOS  --  5.8* 6.9* 5.7* 7.2*   Estimated Creatinine Clearance: 11 mL/min (A) (by C-G formula based on SCr of 9.36 mg/dL (H)). Liver & Pancreas: Recent Labs  Lab 03/08/19 0412 03/09/19 0430 03/10/19 0251 03/11/19 0242  ALBUMIN 2.1* 2.0* 2.0* 1.9*   No results for input(s): LIPASE, AMYLASE in the last 168 hours. No results for input(s): AMMONIA in the last 168 hours. Diabetic: No results for input(s): HGBA1C in the last 72 hours. Recent Labs  Lab 03/10/19 1127 03/10/19 1654 03/10/19 2024 03/11/19 0616 03/11/19 1239  GLUCAP 187* 157* 142* 114* 115*   Cardiac Enzymes: No results for input(s): CKTOTAL, CKMB, CKMBINDEX, TROPONINI in the last 168 hours. No results for input(s): PROBNP in the last 8760 hours. Coagulation Profile: Recent Labs  Lab 03/06/19 1921  INR 1.0   Thyroid Function Tests: No results for input(s): TSH, T4TOTAL, FREET4, T3FREE, THYROIDAB in the last 72 hours. Lipid Profile: No results for input(s):  CHOL, HDL, LDLCALC, TRIG, CHOLHDL, LDLDIRECT in the last 72 hours. Anemia Panel: No results for input(s): VITAMINB12, FOLATE, FERRITIN, TIBC, IRON, RETICCTPCT in the last 72 hours. Urine analysis:    Component Value Date/Time   COLORURINE STRAW (A) 08/07/2018 2328   APPEARANCEUR CLEAR 08/07/2018 2328   LABSPEC 1.010 08/07/2018 2328   PHURINE 6.0 08/07/2018 2328   GLUCOSEU >=500 (A) 08/07/2018 2328   HGBUR SMALL (A) 08/07/2018 2328   BILIRUBINUR NEGATIVE 08/07/2018 2328   KETONESUR NEGATIVE 08/07/2018 2328   PROTEINUR 100 (A) 08/07/2018 2328   UROBILINOGEN 0.2 09/28/2013 1034   NITRITE NEGATIVE 08/07/2018 2328   LEUKOCYTESUR NEGATIVE 08/07/2018 2328   Sepsis Labs: Invalid input(s): PROCALCITONIN, [place]  Microbiology: Recent Results (from the past 240 hour(s))  SARS CORONAVIRUS 2 (TAT 6-24 HRS) Nasopharyngeal Nasopharyngeal Swab     Status: None   Collection Time: 03/06/19  4:39 PM   Specimen: Nasopharyngeal Swab  Result Value Ref Range Status   SARS Coronavirus 2 NEGATIVE NEGATIVE Final    Comment: (NOTE) SARS-CoV-2 target nucleic acids are NOT DETECTED. The SARS-CoV-2 RNA is generally detectable in upper and lower respiratory specimens during the acute phase of infection. Negative results do not preclude SARS-CoV-2 infection, do not rule out co-infections with other pathogens, and should not be used as the sole basis for treatment or other patient management decisions. Negative results must be combined with clinical observations, patient history, and epidemiological information. The expected result is Negative. Fact Sheet for Patients: SugarRoll.be Fact Sheet for Healthcare Providers: https://www.woods-mathews.com/ This test is not yet approved or cleared by the [place] FDA and  has been authorized for detection and/or diagnosis of SARS-CoV-2 by FDA under an Emergency Use Authorization (EUA). This EUA will remain  in  effect (meaning this test can be used) for the duration of the COVID-19 declaration under Section 56 4(b)(1) of the Act, 21 U.S.C. section 360bbb-3(b)(1), unless the authorization is terminated or revoked sooner. Performed at [facility], [place] [address], [place], [place] [address]   Surgical pcr screen     Status: None   Collection Time: 03/06/19 10:09 PM   Specimen: Nasal Mucosa; Nasal Swab  Result Value Ref Range Status   MRSA, PCR NEGATIVE NEGATIVE Final  Staphylococcus aureus NEGATIVE NEGATIVE Final    Comment: (NOTE) The Xpert SA Assay (FDA approved for NASAL specimens in patients 62 years of age and older), is one component of a comprehensive surveillance program. It is not intended to diagnose infection nor to guide or monitor treatment. Performed at [facility], [place] [address], [place], [place] [address]     Radiology Studies: No results found.   Alex Morrison  If 7PM-7AM, please contact night-coverage [email or web address] Password [email or web address] 03/11/2019, 1:51 PM

## 2019-03-11 NOTE — NC FL2 (Signed)
[place] LEVEL OF CARE SCREENING TOOL     IDENTIFICATION  Patient Name: Alex Morrison Birthdate: 12/10/1970 Sex: male Admission Date (Current Location): 03/06/2019  [facility] and [place] Number:  Herbalist and Address:  [facility] [place], [place] [address] [place], [place] [address]      Provider Number: [id number]  Attending Physician Name and Address:  Mercy Riding, MD  Relative Name and Phone Number:  Maxwel Kuka    Current Level of Care: Hospital Recommended Level of Care: [place] Prior Approval Number:    Date Approved/Denied:   PASRR Number: [id number]  Discharge Plan: SNF    Current Diagnoses: Patient Active Problem List   Diagnosis Date Noted  . Tibial plateau fracture 03/06/2019  . ESRD (end stage renal disease) ([place]) 08/08/2018  . HTN (hypertension) 08/08/2018  . SBO (small bowel obstruction) ([place]) 08/08/2018  . Periumbilical hernia [id number]  . Abdominal hernia as complication of peritoneal dialysis 08/08/2018  . Psoriasis 09/29/2013  . Acute gangrenous appendicitis with perforation and peritonitis 09/27/2013  . Type 1 diabetes mellitus ([place]) 09/27/2013  . Acute kidney injury ([place]) 09/27/2013  . DM (diabetes mellitus) type 2, uncontrolled, with ketoacidosis ([place]) 07/17/2012  . DKA (diabetic ketoacidosis) ([place]) 07/16/2012  . Perirectal abscess 07/14/2012    Orientation RESPIRATION BLADDER Height & Weight     Self, Time, Situation, Place  Normal Continent Weight: 200 lb 6.4 oz (90.9 kg) Height:  5\' 11"  (180.3 cm)  BEHAVIORAL SYMPTOMS/MOOD NEUROLOGICAL BOWEL NUTRITION STATUS      Continent Diet(see discharge summary)  AMBULATORY STATUS COMMUNICATION OF NEEDS Skin   Extensive Assist Verbally Normal, Surgical wounds(right tibial plateau fracture)                       Personal Care Assistance Level of Assistance  Bathing, Dressing, Feeding Bathing Assistance: Limited  assistance Feeding assistance: Independent Dressing Assistance: Limited assistance     Functional Limitations Info             SPECIAL CARE FACTORS FREQUENCY  PT (By licensed PT), OT (By licensed OT)     PT Frequency: 5 times a week OT Frequency: 5 times a week            Contractures      Additional Factors Info                  Current Medications (03/11/2019):  This is the current hospital active medication list Current Facility-Administered Medications  Medication Dose Route Frequency Provider Last Rate Last Admin  . albuterol (PROVENTIL) (2.5 MG/3ML) 0.083% nebulizer solution 2.5 mg  2.5 mg Nebulization Q2H PRN Merlene Pulling K, PA-C      . amLODipine (NORVASC) tablet 10 mg  10 mg Oral Daily Merlene Pulling K, PA-C   10 mg at 03/10/19 0836  . bisacodyl (DULCOLAX) suppository 10 mg  10 mg Rectal Daily PRN Merlene Pulling K, PA-C      . Chlorhexidine Gluconate Cloth 2 % PADS 6 each  6 each Topical Q0600 Loren Racer, PA-C   6 each at 03/11/19 [id number]  . Darbepoetin Alfa (ARANESP) 100 MCG/0.5ML injection           . Darbepoetin Alfa (ARANESP) injection 100 mcg  100 mcg Intravenous Q Wed-HD Loren Racer, PA-C   100 mcg at 03/11/19 0910  . diphenhydrAMINE (BENADRYL) 12.5 MG/5ML elixir 12.5-25 mg  12.5-25 mg Oral Q4H PRN Merlene Pulling  K, PA-C      . docusate sodium (COLACE) capsule 100 mg  100 mg Oral BID Merlene Pulling K, PA-C   100 mg at 03/11/19 1246  . doxercalciferol (HECTOROL) 4 MCG/2ML injection           . doxercalciferol (HECTOROL) injection 4 mcg  4 mcg Intravenous Q M,W,F-HD Ernest Haber, PA-C   4 mcg at 03/11/19 0913  . feeding supplement (NEPRO CARB STEADY) liquid 237 mL  237 mL Oral BID BM Ernest Haber, PA-C   237 mL at 03/11/19 1311  . feeding supplement (PRO-STAT SUGAR FREE 64) liquid 30 mL  30 mL Oral BID Ernest Haber, PA-C   30 mL at 03/10/19 2157  . ferric gluconate (NULECIT) 125 mg in sodium chloride 0.9 % 100 mL IVPB  125 mg Intravenous Q  M,W,F-HD Loren Racer, PA-C   Stopped at 03/11/19 1024  . heparin injection 5,000 Units  5,000 Units Subcutaneous Q8H Merlene Pulling K, PA-C   5,000 Units at 03/11/19 1311  . insulin aspart (novoLOG) injection 0-15 Units  0-15 Units Subcutaneous TID WC Mercy Riding, MD   3 Units at 03/10/19 [phone]  . insulin aspart (novoLOG) injection 0-5 Units  0-5 Units Subcutaneous QHS Gonfa, Taye T, MD      . insulin aspart (novoLOG) injection 2 Units  2 Units Subcutaneous TID WC Gonfa, Taye T, MD      . insulin glargine (LANTUS) injection 10 Units  10 Units Subcutaneous QHS Wendee Beavers T, MD   10 Units at 03/10/19 2200  . labetalol (NORMODYNE) injection 10 mg  10 mg Intravenous Q2H PRN Merlene Pulling K, PA-C      . methocarbamol (ROBAXIN) tablet 500 mg  500 mg Oral Q6H PRN Merlene Pulling K, PA-C   500 mg at 03/11/19 0640   Or  . methocarbamol (ROBAXIN) 500 mg in dextrose 5 % 50 mL IVPB  500 mg Intravenous Q6H PRN Merlene Pulling K, PA-C      . metoCLOPramide (REGLAN) tablet 5-10 mg  5-10 mg Oral Q8H PRN Merlene Pulling K, PA-C       Or  . metoCLOPramide (REGLAN) injection 5-10 mg  5-10 mg Intravenous Q8H PRN Merlene Pulling K, PA-C      . multivitamin (RENA-VIT) tablet 1 tablet  1 tablet Oral QHS Ernest Haber, PA-C   1 tablet at 03/10/19 2200  . ondansetron (ZOFRAN) tablet 4 mg  4 mg Oral Q6H PRN Merlene Pulling K, PA-C       Or  . ondansetron [facility]) injection 4 mg  4 mg Intravenous Q6H PRN Merlene Pulling K, PA-C   4 mg at 03/08/19 1603  . oxyCODONE (Oxy IR/ROXICODONE) immediate release tablet 10-15 mg  10-15 mg Oral Q4H PRN Merlene Pulling K, PA-C   10 mg at 03/09/19 1236  . oxyCODONE (Oxy IR/ROXICODONE) immediate release tablet 5-10 mg  5-10 mg Oral Q4H PRN Merlene Pulling K, PA-C   10 mg at 03/11/19 1244  . polyethylene glycol (MIRALAX / GLYCOLAX) packet 17 g  17 g Oral Daily PRN Merlene Pulling K, PA-C      . senna (SENOKOT) tablet 8.6 mg  1 tablet Oral BID Merlene Pulling K, PA-C   8.6 mg at 03/11/19 1246  .  sevelamer carbonate (RENVELA) tablet 2,400 mg  2,400 mg Oral TID WC Loren Racer, PA-C   2,400 mg at 03/11/19 1245  . zolpidem (AMBIEN) tablet 5 mg  5 mg Oral QHS PRN,MR X 1 Brown,  Ciro Backer, PA-C         Discharge Medications: Please see discharge summary for a list of discharge medications.  Relevant Imaging Results:  Relevant Lab Results:   Additional Information on HD MWF at [facility] [phone]   Atilano Median, LCSW

## 2019-03-11 NOTE — Plan of Care (Signed)
  Problem: Education: Goal: Knowledge of General Education information will improve Description: Including pain rating scale, medication(s)/side effects and non-pharmacologic comfort measures Outcome: Progressing   Problem: Clinical Measurements: Goal: Respiratory complications will improve Outcome: Progressing Note: On room air   Problem: Activity: Goal: Risk for activity intolerance will decrease Outcome: Progressing Note: Up to chair for dinner   Problem: Coping: Goal: Level of anxiety will decrease Outcome: Progressing   Problem: Elimination: Goal: Will not experience complications related to urinary retention Outcome: Progressing   Problem: Pain Managment: Goal: General experience of comfort will improve Outcome: Progressing Note: Treated twice with oxycodone for right leg pain

## 2019-03-11 NOTE — Progress Notes (Addendum)
PT Cancellation Note  Patient Details Name: AMAHRI BONGO MRN: [id number] DOB: 16-Dec-1970   Cancelled Treatment:    Reason Eval/Treat Not Completed: Pt at procedure. (pt at HD. Will follow.)  Philomena Doheny PT 03/11/2019  Acute Rehabilitation Services Pager [phone] Office [phone]

## 2019-03-11 NOTE — Progress Notes (Signed)
Renal Navigator spoke with CSW/S. Wicker who reports patient is requesting to pursue SNF placement. Navigator is available for assistance as needed depending on final disposition.   Alphonzo Cruise, [place] Renal Navigator [phone]

## 2019-03-11 NOTE — Progress Notes (Signed)
Subjective: 4 Days Post-Op s/p Procedure(s): OPEN REDUCTION INTERNAL FIXATION (ORIF) FIBULA FRACTURE Open Reduction Internal Fixation (Orif) Tibial Plateau   Patient is alert, oriented, eating lunch in bed. Reports pain to be a 6/10 today. Denies chest pain, SOB, Calf pain. No nausea/vomiting. No other complaints.  Objective:  PE: VITALS:   Vitals:   03/11/19 1030 03/11/19 1100 03/11/19 1126 03/11/19 1240  BP: 123/63 114/61 137/68 108/61  Pulse: 89 93 96 96  Resp: 16  17 18   Temp:   98.2 F (36.8 C) 99.3 F (37.4 C)  TempSrc:   Oral Oral  SpO2:    99%  Weight:   90.9 kg   Height:       General: Patient laying in bed, in no acute distress. RLE: posterior splint and knee immobilizer intact. Dressings in place with mild strike throughat tibial dressing. Able to move all toes of right foot. Right foot warm and well perfused. Distal sensation intact. Compartments soft, compressible.  LABS  Results for orders placed or performed during the hospital encounter of 03/06/19 (from the past 24 hour(s))  Glucose, capillary     Status: Abnormal   Collection Time: 03/10/19  4:54 PM  Result Value Ref Range   Glucose-Capillary 157 (H) 70 - 99 mg/dL  Glucose, capillary     Status: Abnormal   Collection Time: 03/10/19  8:24 PM  Result Value Ref Range   Glucose-Capillary 142 (H) 70 - 99 mg/dL  Renal function panel     Status: Abnormal   Collection Time: 03/11/19  2:42 AM  Result Value Ref Range   Sodium 136 135 - 145 mmol/L   Potassium 4.4 3.5 - 5.1 mmol/L   Chloride 94 (L) 98 - 111 mmol/L   CO2 27 22 - 32 mmol/L   Glucose, Bld 126 (H) 70 - 99 mg/dL   BUN 64 (H) 6 - 20 mg/dL   Creatinine, Ser 9.36 (H) 0.61 - 1.24 mg/dL   Calcium 8.1 (L) 8.9 - 10.3 mg/dL   Phosphorus 7.2 (H) 2.5 - 4.6 mg/dL   Albumin 1.9 (L) 3.5 - 5.0 g/dL   GFR calc non Af Amer 6 (L) >60 mL/min   GFR calc Af Amer 7 (L) >60 mL/min   Anion gap 15 5 - 15  Magnesium     Status: None   Collection Time:  03/11/19  2:42 AM  Result Value Ref Range   Magnesium 1.7 1.7 - 2.4 mg/dL  CBC     Status: Abnormal   Collection Time: 03/11/19  2:42 AM  Result Value Ref Range   WBC 6.6 4.0 - 10.5 K/uL   RBC 2.66 (L) 4.22 - 5.81 MIL/uL   Hemoglobin 7.3 (L) 13.0 - 17.0 g/dL   HCT 23.3 (L) 39.0 - 52.0 %   MCV 87.6 80.0 - 100.0 fL   MCH 27.4 26.0 - 34.0 pg   MCHC 31.3 30.0 - 36.0 g/dL   RDW 14.6 11.5 - 15.5 %   Platelets 228 150 - 400 K/uL   nRBC 0.0 0.0 - 0.2 %  Glucose, capillary     Status: Abnormal   Collection Time: 03/11/19  6:16 AM  Result Value Ref Range   Glucose-Capillary 114 (H) 70 - 99 mg/dL  Glucose, capillary     Status: Abnormal   Collection Time: 03/11/19 12:39 PM  Result Value Ref Range   Glucose-Capillary 115 (H) 70 - 99 mg/dL    No results found.  Assessment/Plan: Active Problems:  Tibial plateau fracture    4 Days Post-Op s/p Procedure(s): OPEN REDUCTION INTERNAL FIXATION (ORIF) FIBULA FRACTURE Open Reduction Internal Fixation (Orif) Tibial Plateau  Weightbearing:Non-weight bearing in RLE for 6-8 weeks post-operatively.Up with therapy Insicional and dressing care:PRN dressing changes Orthopedic device(s):Continue posterior splint and knee immobilizer. Ace-wrap in place to help with swelling. VTE prophylaxis:heparinwhile in hospital, spoke with Dr. Cyndia Skeeters and we'll plan on aspirin at discharge Pain control:continue current prn pain control regimen Follow - up plan:Plan to follow-up with Dr. Mardelle Matte 2 weeks after discharge. Disposition:PT recommending HHPT. Patient would like to go to SNF due to lack to support at home and need for transportation help to HD clinic. TOC on board for SNF planning.    Contact information:   Weekdays 8-5 Merlene Pulling, [place] [phone] After hours and holidays please check [email or web address] for group call information for [facility]  Ventura Bruns 03/11/2019, 1:01 PM

## 2019-03-11 NOTE — Progress Notes (Signed)
Physical Therapy Treatment Patient Details Name: Alex Morrison MRN: [name] DOB: [birth year] Today's Date: 03/11/2019    History of Present Illness 49 yo admitted after fall off loading dock at work with right fibula and tibial plateau fx s/p ORIF. PMHx: ESRD, DM, HTN    PT Comments    Pt tolerated increased ambulation distance of 16' with RW, distance limited by RLE pain and fatigue. Pt may need ST-SNF if transportation to HD cannot be arranged from his parent's home. Pt reports his parents cannot transport him and cannot provide much assistance. DC to pt's home not appropriate 2* stairs to enter and pt is NWB RLE.    Follow Up Recommendations  SNF;Supervision for mobility/OOB(pt progressing slowly, lacks transportation to HD)     Equipment Recommendations  Rolling walker with 5" wheels;Wheelchair (measurements PT);3in1 (PT)    Recommendations for Other Services       Precautions / Restrictions Precautions Precautions: Fall Required Braces or Orthoses: Knee Immobilizer - Right Knee Immobilizer - Right: On at all times Restrictions Weight Bearing Restrictions: Yes RLE Weight Bearing: Non weight bearing    Mobility  Bed Mobility Overal bed mobility: Needs Assistance Bed Mobility: Supine to Sit     Supine to sit: Min assist     General bed mobility comments: assist to move RLE to EOB with cues for sequence and increased time  Transfers Overall transfer level: Needs assistance Equipment used: Rolling walker (2 wheeled) Transfers: Sit to/from Stand Sit to Stand: Min guard         General transfer comment: cues for hand placement, RLE positioning and safety  Ambulation/Gait Ambulation/Gait assistance: Min guard Gait Distance (Feet): 16 Feet Assistive device: Rolling walker (2 wheeled) Gait Pattern/deviations: Step-to pattern Gait velocity: decr   General Gait Details: cues for sequence with pt fatiguing quickly with hopping but maintaining NWB RLE  throughout, KI on RLE, 7/10 RLE pain walking   Stairs             Wheelchair Mobility    Modified Rankin (Stroke Patients Only)       Balance Overall balance assessment: Mild deficits observed, not formally tested(relies on BUE support in standing 2* NWB status RLE)                                          Cognition Arousal/Alertness: Awake/alert Behavior During Therapy: WFL for tasks assessed/performed Overall Cognitive Status: Within Functional Limits for tasks assessed                                        Exercises General Exercises - Lower Extremity Quad Sets: AROM;Both;5 reps;Supine Straight Leg Raises: AAROM;Right;10 reps;Supine    General Comments        Pertinent Vitals/Pain Pain Score: 7  Pain Location: RLE with walking Pain Descriptors / Indicators: Sore Pain Intervention(s): Limited activity within patient's tolerance;Monitored during session;Premedicated before session;Ice applied    Home Living                      Prior Function            PT Goals (current goals can now be found in the care plan section) Acute Rehab PT Goals Patient Stated Goal: be able to return to work PT Goal Formulation:  With patient Time For Goal Achievement: 03/24/19 Potential to Achieve Goals: Good    Frequency    Min 5X/week      PT Plan Discharge plan needs to be updated    Co-evaluation              AM-PAC PT "6 Clicks" Mobility   Outcome Measure  Help needed turning from your back to your side while in a flat bed without using bedrails?: A Little Help needed moving from lying on your back to sitting on the side of a flat bed without using bedrails?: A Little Help needed moving to and from a bed to a chair (including a wheelchair)?: A Little Help needed standing up from a chair using your arms (e.g., wheelchair or bedside chair)?: A Little Help needed to walk in hospital room?: A Little Help needed  climbing 3-5 steps with a railing? : A Lot 6 Click Score: 17    End of Session Equipment Utilized During Treatment: Gait belt;Right knee immobilizer Activity Tolerance: Patient tolerated treatment well Patient left: in chair;with call bell/phone within reach;with chair alarm set Nurse Communication: Mobility status;Precautions;Weight bearing status PT Visit Diagnosis: Other abnormalities of gait and mobility (R26.89);Difficulty in walking, not elsewhere classified (R26.2)     Time: [id number] PT Time Calculation (min) (ACUTE ONLY): 19 min  Charges:  $Gait Training: 8-22 mins                    Blondell Reveal Kistler PT 03/11/2019  Acute Rehabilitation Services Pager [phone] Office [phone]

## 2019-03-12 LAB — CBC
HCT: 24.5 % — ABNORMAL LOW (ref 39.0–52.0)
Hemoglobin: 7.6 g/dL — ABNORMAL LOW (ref 13.0–17.0)
MCH: 27.3 pg (ref 26.0–34.0)
MCHC: 31 g/dL (ref 30.0–36.0)
MCV: 88.1 fL (ref 80.0–100.0)
Platelets: 244 10*3/uL (ref 150–400)
RBC: 2.78 MIL/uL — ABNORMAL LOW (ref 4.22–5.81)
RDW: 14.5 % (ref 11.5–15.5)
WBC: 7.2 10*3/uL (ref 4.0–10.5)
nRBC: 0 % (ref 0.0–0.2)

## 2019-03-12 LAB — RENAL FUNCTION PANEL
Albumin: 2.1 g/dL — ABNORMAL LOW (ref 3.5–5.0)
Anion gap: 10 (ref 5–15)
BUN: 41 mg/dL — ABNORMAL HIGH (ref 6–20)
CO2: 28 mmol/L (ref 22–32)
Calcium: 8.3 mg/dL — ABNORMAL LOW (ref 8.9–10.3)
Chloride: 95 mmol/L — ABNORMAL LOW (ref 98–111)
Creatinine, Ser: 6.77 mg/dL — ABNORMAL HIGH (ref 0.61–1.24)
GFR calc Af Amer: 10 mL/min — ABNORMAL LOW (ref >60)
GFR calc non Af Amer: 9 mL/min — ABNORMAL LOW (ref >60)
Glucose, Bld: 192 mg/dL — ABNORMAL HIGH (ref 70–99)
Phosphorus: 7.1 mg/dL — ABNORMAL HIGH (ref 2.5–4.6)
Potassium: 4 mmol/L (ref 3.5–5.1)
Sodium: 133 mmol/L — ABNORMAL LOW (ref 135–145)

## 2019-03-12 LAB — GLUCOSE, CAPILLARY
Glucose-Capillary: 185 mg/dL — ABNORMAL HIGH (ref 70–99)
Glucose-Capillary: 177 mg/dL — ABNORMAL HIGH (ref 70–99)
Glucose-Capillary: 188 mg/dL — ABNORMAL HIGH (ref 70–99)
Glucose-Capillary: 132 mg/dL — ABNORMAL HIGH (ref 70–99)
Glucose-Capillary: 161 mg/dL — ABNORMAL HIGH (ref 70–99)

## 2019-03-12 LAB — MAGNESIUM: Magnesium: 2 mg/dL (ref 1.7–2.4)

## 2019-03-12 NOTE — Progress Notes (Signed)
Subjective: 5 Days Post-Op s/p Procedure(s): OPEN REDUCTION INTERNAL FIXATION (ORIF) FIBULA FRACTURE Open Reduction Internal Fixation (Orif) Tibial Plateau   Patient is sleeping comfortably in bed. States pain in right lower extremity is moderate but has improved. No other complaints.  Objective:  PE: VITALS:   Vitals:   03/11/19 1100 03/11/19 1126 03/11/19 1240 03/12/19 0258  BP: 114/61 137/68 108/61 130/67  Pulse: 93 96 96 91  Resp:  17 18 18   Temp:  98.2 F (36.8 C) 99.3 F (37.4 C) 98.5 F (36.9 C)  TempSrc:  Oral Oral Oral  SpO2:   99% 98%  Weight:  90.9 kg    Height:       General: Patient sleeping comfortably in bed Abdomen: soft, non-tender MSK:  RLE: posterior splint and knee immobilizer intact. Dressings in place with mild strike throughat tibial dressing. Able to move all toes of right foot. Distal sensation intact. Right foot warm and well perfused. Compartments soft, compressible.  LABS  Results for orders placed or performed during the hospital encounter of 03/06/19 (from the past 24 hour(s))  Glucose, capillary     Status: Abnormal   Collection Time: 03/11/19 12:39 PM  Result Value Ref Range   Glucose-Capillary 115 (H) 70 - 99 mg/dL  Glucose, capillary     Status: Abnormal   Collection Time: 03/11/19  5:24 PM  Result Value Ref Range   Glucose-Capillary 244 (H) 70 - 99 mg/dL  Glucose, capillary     Status: Abnormal   Collection Time: 03/11/19  7:28 PM  Result Value Ref Range   Glucose-Capillary 233 (H) 70 - 99 mg/dL  Renal function panel     Status: Abnormal   Collection Time: 03/12/19  2:49 AM  Result Value Ref Range   Sodium 133 (L) 135 - 145 mmol/L   Potassium 4.0 3.5 - 5.1 mmol/L   Chloride 95 (L) 98 - 111 mmol/L   CO2 28 22 - 32 mmol/L   Glucose, Bld 192 (H) 70 - 99 mg/dL   BUN 41 (H) 6 - 20 mg/dL   Creatinine, Ser 6.77 (H) 0.61 - 1.24 mg/dL   Calcium 8.3 (L) 8.9 - 10.3 mg/dL   Phosphorus 7.1 (H) 2.5 - 4.6 mg/dL   Albumin 2.1 (L)  3.5 - 5.0 g/dL   GFR calc non Af Amer 9 (L) >60 mL/min   GFR calc Af Amer 10 (L) >60 mL/min   Anion gap 10 5 - 15  CBC     Status: Abnormal   Collection Time: 03/12/19  2:49 AM  Result Value Ref Range   WBC 7.2 4.0 - 10.5 K/uL   RBC 2.78 (L) 4.22 - 5.81 MIL/uL   Hemoglobin 7.6 (L) 13.0 - 17.0 g/dL   HCT 24.5 (L) 39.0 - 52.0 %   MCV 88.1 80.0 - 100.0 fL   MCH 27.3 26.0 - 34.0 pg   MCHC 31.0 30.0 - 36.0 g/dL   RDW 14.5 11.5 - 15.5 %   Platelets 244 150 - 400 K/uL   nRBC 0.0 0.0 - 0.2 %  Magnesium     Status: None   Collection Time: 03/12/19  2:49 AM  Result Value Ref Range   Magnesium 2.0 1.7 - 2.4 mg/dL    No results found.  Assessment/Plan: Active Problems:   Tibial plateau fracture    5 Days Post-Op s/p Procedure(s): OPEN REDUCTION INTERNAL FIXATION (ORIF) FIBULA FRACTURE Open Reduction Internal Fixation (Orif) Tibial Plateau  Weightbearing:Non-weight bearing in RLE  for 6-8 weeks post-operatively.Up with therapy Insicional and dressing care:PRN dressing changes Orthopedic device(s):Continue posterior splint and knee immobilizer. Ace-wrap in place to help with swelling, can remove ace-wrap prior to discharge VTE prophylaxis:heparinwhile in hospital, plan on aspirin at discharge, total of one month of post-operative VTE prophylaxis Pain control:continue current prn pain control regimen Follow - up plan:Plan to follow-up with Dr. Mardelle Matte 2 weeks after discharge. Disposition:waiting on SNF placement, ok for discharge when he gains a bed if cleared by medicine  Contact information:   Weekdays 8-5 Merlene Pulling, PA-C [phone] A fter hours and holidays please check [email or web address] for group call information for [facility]  Ventura Bruns 03/12/2019, 6:38 AM

## 2019-03-12 NOTE — Progress Notes (Signed)
PROGRESS NOTE  Alex Morrison [facility] [id number] DOB: 1970/05/29   PCP: Haywood Pao, MD  Patient is from: Home.  Independently ambulates at baseline.  DOA: 03/06/2019 LOS: 6  Brief Narrative / Interim history: 49 y.o. male with history of ESRD on HD MWF, DM-1, HTN and recent hospitalization at [facility] from 2/9-2/15 with Streptococcus mitis bacteremia presenting with mechanical fall from loading dock about 4 feet high and subsequent right tibial plateau fracture and right distal fibula fracture.  Orthopedic surgery consulted, and planning surgical management of right tibial plateau fracture.  Nephrology consulted for HD in case he happens to be here on Monday. Patient underwent ORIF of right tibial plateau fracture and right distal fibula fracture on 03/06/2018 by Dr. Mardelle Matte.  Patient will be nonweightbearing on RLE for 6 to 8 weeks.  Patient and family concerned about patient going home. Now waiting on SNF with ability to transport patient to HD.   Subjective: No major events overnight or this morning. Pain fairly controlled on oral regimen.  No complaints.  Waiting on SNF bed.  Objective: Vitals:   03/11/19 1126 03/11/19 1240 03/12/19 0258 03/12/19 0811  BP: 137/68 108/61 130/67 129/64  Pulse: 96 96 91 85  Resp: 17 18 18 18   Temp: 98.2 F (36.8 C) 99.3 F (37.4 C) 98.5 F (36.9 C) 98.9 F (37.2 C)  TempSrc: Oral Oral Oral Oral  SpO2:  99% 98% 98%  Weight: 90.9 kg     Height:        Intake/Output Summary (Last 24 hours) at 03/12/2019 1107 Last data filed at 03/12/2019 0300 Gross per 24 hour  Intake --  Output 1950 ml  Net -1950 ml   Filed Weights   03/09/19 1130 03/11/19 0720 03/11/19 1126  Weight: 93 kg 92.6 kg 90.9 kg    Examination:  GENERAL: No acute distress.  Appears well.  HEENT: MMM.  Vision and hearing grossly intact.  NECK: Supple.  No apparent JVD.  RESP:  No IWOB. Good air movement bilaterally. CVS:  RRR. Heart sounds normal.  ABD/GI/GU: Bowel sounds  present. Soft. Non tender.  MSK/EXT: Dressing and Ace wrap over RLE.  Neurovascular intact in his toes. SKIN: no apparent skin lesion or wound NEURO: Awake, alert and oriented appropriately.  No apparent focal neuro deficit. PSYCH: Calm. Normal affect.   Procedures:  2/20-ORIF of right tibial plateau fracture and right distal fibula fracture on 03/07/2019 by Dr. Mardelle Matte.  Assessment & Plan: Mechanical fall from loading dock about 4 feet high Comminuted, displaced and depressed fracture lateral tibial plateau associated with hemarthrosis Displaced fracture of the distal right fibula-noted on x-ray. -ORIF of right tibial plateau fracture and right distal fibula fracture on 03/07/2019 by Dr. Mardelle Matte. -Pain control per orthopedic surgeon-pain seems to have been controlled on oral regimen. -Subcu heparin for VTE prophylaxis-ASA on discharge. -Per Ortho, NWB on RLE for 6 to 8 weeks, VTE ppx for a month and outpt follow up in 2 weeks -PT/OT  Uncontrolled DM-1: A1c 9.2%.  On Lantus 10 units nightly and SSI at home CBG (last 3)  Recent Labs    03/11/19 1928 03/12/19 0705 03/12/19 0807  GLUCAP 233* 185* 177*  -Continue SSI-moderate, NovoLog AC 2 units and Lantus 10 units nightly -Further adjustment as appropriate.  ESRD on HD MWF/BMD:  -Nephrology on board.  Anemia of renal disease: Baseline Hgb 8-9> 8.9 (admit)> 7.5> 7.2> 7.6.  Anemia panel consistent with both IDA and ACD.  Denies melena or hematochezia.  Hesitant about  blood transfusion. -Monitor intermittently -ESA and iron infusion per nephrology.  Essential hypertension: BP fairly controlled. -Continue home amlodipine and as needed labetalol  Streptococcus mitis bacteremia-hospitalized for this at [facility] 2/9-2/15 and discharged on Ancef with HD that he completed on 2/19.  Bacteremia thought to be from HD cath.  HD cath replaced.  TTE reportedly negative for vegetation.  Constipation -Continue MiraLAX and  Senokot-S               DVT prophylaxis: Subcu heparin Code Status: Full code Family Communication: Patient and/or RN. Available if any question.  Discharge barrier: Safe disposition, which would be SNF with ability to transport for HD Patient is from: Home Final disposition: SNF  Consultants: Orthopedic surgery, nephrology   Microbiology summarized: [id number] negative.  Sch Meds:  Scheduled Meds: . amLODipine  10 mg Oral Daily  . Chlorhexidine Gluconate Cloth  6 each Topical Q0600  . darbepoetin (ARANESP) injection - DIALYSIS  100 mcg Intravenous Q Wed-HD  . docusate sodium  100 mg Oral BID  . doxercalciferol  4 mcg Intravenous Q M,W,F-HD  . feeding supplement (NEPRO CARB STEADY)  237 mL Oral BID BM  . feeding supplement (PRO-STAT SUGAR FREE 64)  30 mL Oral BID  . heparin  5,000 Units Subcutaneous Q8H  . insulin aspart  0-15 Units Subcutaneous TID WC  . insulin aspart  0-5 Units Subcutaneous QHS  . insulin aspart  2 Units Subcutaneous TID WC  . insulin glargine  10 Units Subcutaneous QHS  . multivitamin  1 tablet Oral QHS  . senna  1 tablet Oral BID  . sevelamer carbonate  2,400 mg Oral TID WC   Continuous Infusions: . ferric gluconate (FERRLECIT/NULECIT) IV Stopped (03/11/19 1024)  . methocarbamol (ROBAXIN) IV     PRN Meds:.albuterol, bisacodyl, diphenhydrAMINE, labetalol, methocarbamol **OR** methocarbamol (ROBAXIN) IV, metoCLOPramide **OR** metoCLOPramide (REGLAN) injection, ondansetron **OR** ondansetron (ZOFRAN) IV, oxyCODONE, oxyCODONE, polyethylene glycol, zolpidem  Antimicrobials: Anti-infectives (From admission, onward)   Start     Dose/Rate Route Frequency Ordered Stop   03/07/19 2300  ceFAZolin (ANCEF) IVPB 2g/100 mL premix     2 g 200 mL/hr over 30 Minutes Intravenous Every 6 hours 03/07/19 2113 03/08/19 1034   03/07/19 0600  ceFAZolin (ANCEF) IVPB 2g/100 mL premix  Status:  Discontinued     2 g 200 mL/hr over 30 Minutes Intravenous To Short Stay  03/06/19 2023 03/07/19 2110       I have personally reviewed the following labs and images: CBC: Recent Labs  Lab 03/06/19 1800 03/07/19 0334 03/09/19 0430 03/09/19 0738 03/10/19 0251 03/11/19 0242 03/12/19 0249  WBC 8.7   < > 7.3 7.2 6.1 6.6 7.2  NEUTROABS 7.2  --   --   --   --   --   --   HGB 8.9*   < > 7.2* 7.3* 7.2* 7.3* 7.6*  HCT 28.7*   < > 23.8* 24.4* 23.5* 23.3* 24.5*  MCV 90.0   < > 91.9 93.1 90.7 87.6 88.1  PLT 318   < > 221 242 222 228 244   < > = values in this interval not displayed.   BMP &GFR Recent Labs  Lab 03/08/19 0412 03/09/19 0430 03/10/19 0251 03/11/19 0242 03/12/19 0249  NA 137 137 134* 136 133*  K 4.3 4.2 4.2 4.4 4.0  CL 98 94* 93* 94* 95*  CO2 28 28 28 27 28   GLUCOSE 219* 226* 188* 126* 192*  BUN 31* 48* 33* 64*  41*  CREATININE 7.51* 9.46* 6.51* 9.36* 6.77*  CALCIUM 7.8* 8.2* 8.2* 8.1* 8.3*  MG 2.0 2.2 1.7 1.7 2.0  PHOS 5.8* 6.9* 5.7* 7.2* 7.1*   Estimated Creatinine Clearance: 15.2 mL/min (A) (by C-G formula based on SCr of 6.77 mg/dL (H)). Liver & Pancreas: Recent Labs  Lab 03/08/19 0412 03/09/19 0430 03/10/19 0251 03/11/19 0242 03/12/19 0249  ALBUMIN 2.1* 2.0* 2.0* 1.9* 2.1*   No results for input(s): LIPASE, AMYLASE in the last 168 hours. No results for input(s): AMMONIA in the last 168 hours. Diabetic: No results for input(s): HGBA1C in the last 72 hours. Recent Labs  Lab 03/11/19 1239 03/11/19 1724 03/11/19 1928 03/12/19 0705 03/12/19 0807  GLUCAP 115* 244* 233* 185* 177*   Cardiac Enzymes: No results for input(s): CKTOTAL, CKMB, CKMBINDEX, TROPONINI in the last 168 hours. No results for input(s): PROBNP in the last 8760 hours. Coagulation Profile: Recent Labs  Lab 03/06/19 1921  INR 1.0   Thyroid Function Tests: No results for input(s): TSH, T4TOTAL, FREET4, T3FREE, THYROIDAB in the last 72 hours. Lipid Profile: No results for input(s): CHOL, HDL, LDLCALC, TRIG, CHOLHDL, LDLDIRECT in the last 72  hours. Anemia Panel: No results for input(s): VITAMINB12, FOLATE, FERRITIN, TIBC, IRON, RETICCTPCT in the last 72 hours. Urine analysis:    Component Value Date/Time   COLORURINE STRAW (A) 08/07/2018 2328   APPEARANCEUR CLEAR 08/07/2018 2328   LABSPEC 1.010 08/07/2018 2328   PHURINE 6.0 08/07/2018 2328   GLUCOSEU >=500 (A) 08/07/2018 2328   HGBUR SMALL (A) 08/07/2018 2328   BILIRUBINUR NEGATIVE 08/07/2018 2328   KETONESUR NEGATIVE 08/07/2018 2328   PROTEINUR 100 (A) 08/07/2018 2328   UROBILINOGEN 0.2 09/28/2013 1034   NITRITE NEGATIVE 08/07/2018 2328   LEUKOCYTESUR NEGATIVE 08/07/2018 2328   Sepsis Labs: Invalid input(s): PROCALCITONIN, [place]  Microbiology: Recent Results (from the past 240 hour(s))  SARS CORONAVIRUS 2 (TAT 6-24 HRS) Nasopharyngeal Nasopharyngeal Swab     Status: None   Collection Time: 03/06/19  4:39 PM   Specimen: Nasopharyngeal Swab  Result Value Ref Range Status   SARS Coronavirus 2 NEGATIVE NEGATIVE Final    Comment: (NOTE) SARS-CoV-2 target nucleic acids are NOT DETECTED. The SARS-CoV-2 RNA is generally detectable in upper and lower respiratory specimens during the acute phase of infection. Negative results do not preclude SARS-CoV-2 infection, do not rule out co-infections with other pathogens, and should not be used as the sole basis for treatment or other patient management decisions. Negative results must be combined with clinical observations, patient history, and epidemiological information. The expected result is Negative. Fact Sheet for Patients: SugarRoll.be Fact Sheet for Healthcare Providers: https://www.woods-mathews.com/ This test is not yet approved or cleared by the [place] FDA and  has been authorized for detection and/or diagnosis of SARS-CoV-2 by FDA under an Emergency Use Authorization (EUA). This EUA will remain  in effect (meaning this test can be used) for the duration of  the COVID-19 declaration under Section 56 4(b)(1) of the Act, 21 U.S.C. section 360bbb-3(b)(1), unless the authorization is terminated or revoked sooner. Performed at [facility], [place] [address] [place], [place] [address]   Surgical pcr screen     Status: None   Collection Time: 03/06/19 10:09 PM   Specimen: Nasal Mucosa; Nasal Swab  Result Value Ref Range Status   MRSA, PCR NEGATIVE NEGATIVE Final   Staphylococcus aureus NEGATIVE NEGATIVE Final    Comment: (NOTE) The Xpert SA Assay (FDA approved for NASAL specimens in patients 22 years  of age and older), is one component of a comprehensive surveillance program. It is not intended to diagnose infection nor to guide or monitor treatment. Performed at [facility], [place] [address] [place], [place] [address]     Radiology Studies: No results found.   Yoskar Murrillo T. Sedan  If 7PM-7AM, please contact night-coverage [email or web address] Password [facility] 03/12/2019, 11:07 AM

## 2019-03-12 NOTE — Progress Notes (Signed)
Occupational Therapy Treatment Patient Details Name: Alex Morrison MRN: [id number] DOB: 08-Nov-1970 Today's Date: 03/12/2019    History of present illness 49 yo admitted after fall off loading dock at work with right fibula and tibial plateau fx s/p ORIF. PMHx: ESRD, DM, HTN   OT comments  Patient seated in recliner upon entry, reports fatigued from PT session.  Educated on compensatory techniques for LB ADLs, completing 1 handed techniques with min guard and lateral lean techniques with supervision. Educated on UE strengthening exercises, chair pushups, for increased tolerance and stability using RW- pt verbalizes understanding. Applied ice to R knee at completion of session and encouraged OOB throughout the day.    Follow Up Recommendations  Home health OT;Supervision - Intermittent    Equipment Recommendations  3 in 1 bedside commode    Recommendations for Other Services      Precautions / Restrictions Precautions Precautions: Fall Required Braces or Orthoses: Knee Immobilizer - Right Knee Immobilizer - Right: On at all times Restrictions Weight Bearing Restrictions: Yes RLE Weight Bearing: Non weight bearing       Mobility Bed Mobility         Supine to sit: Min guard;HOB elevated     General bed mobility comments: OOB upon entry in recliner '  Transfers Overall transfer level: Needs assistance Equipment used: Rolling walker (2 wheeled) Transfers: Sit to/from Stand Sit to Stand: Min guard         General transfer comment: min guard for safety, balance with cueing for hand placement and sequencing     Balance Overall balance assessment: Mild deficits observed, not formally tested                                         ADL either performed or assessed with clinical judgement   ADL Overall ADL's : Needs assistance/impaired             Lower Body Bathing: Min guard;Sit to/from stand;Sitting/lateral leans Lower Body Bathing Details  (indicate cue type and reason): reviewed compensatory techniques with lateral leaning      Lower Body Dressing: Min guard;Sitting/lateral leans;Sit to/from stand Lower Body Dressing Details (indicate cue type and reason): reviewed compensatory techniques for safety (1 handed techniques or lateral leans), donning R LE first           Tub/Shower Transfer Details (indicate cue type and reason): pt plans on basin bathing only  Functional mobility during ADLs: Min guard;Rolling walker General ADL Comments: limited tolerance      Vision       Perception     Praxis      Cognition Arousal/Alertness: Awake/alert Behavior During Therapy: WFL for tasks assessed/performed Overall Cognitive Status: Within Functional Limits for tasks assessed                                          Exercises Exercises: Other exercises General Exercises - Lower Extremity Hip ABduction/ADduction: AROM;Right;Seated;10 reps Straight Leg Raises: AROM;Right;Seated;10 reps Other Exercises Other Exercises: educated on chair pushups for increased UE strength, tolerance and endurance for mobility    Shoulder Instructions       General Comments      Pertinent Vitals/ Pain       Pain Assessment: 0-10 Pain Score: 5  Pain Location: R  LE  Pain Descriptors / Indicators: Aching;Sore Pain Intervention(s): Limited activity within patient's tolerance;Monitored during session;Repositioned;Ice applied  Home Living                                          Prior Functioning/Environment              Frequency  Min 2X/week        Progress Toward Goals  OT Goals(current goals can now be found in the care plan section)  Progress towards OT goals: Progressing toward goals  Acute Rehab OT Goals Patient Stated Goal: be able to return to work OT Goal Formulation: With patient  Plan Discharge plan remains appropriate;Frequency remains appropriate    Co-evaluation                  AM-PAC OT "6 Clicks" Daily Activity     Outcome Measure   Help from another person eating meals?: A Little Help from another person taking care of personal grooming?: A Little Help from another person toileting, which includes using toliet, bedpan, or urinal?: A Little Help from another person bathing (including washing, rinsing, drying)?: A Little Help from another person to put on and taking off regular upper body clothing?: A Little Help from another person to put on and taking off regular lower body clothing?: A Little 6 Click Score: 18    End of Session Equipment Utilized During Treatment: Rolling walker  OT Visit Diagnosis: Unsteadiness on feet (R26.81);Other abnormalities of gait and mobility (R26.89);Muscle weakness (generalized) (M62.81);Pain Pain - Right/Left: Right Pain - part of body: Leg   Activity Tolerance Patient tolerated treatment well   Patient Left in chair;with call bell/phone within reach   Nurse Communication Mobility status;Precautions        Time: [id number] OT Time Calculation (min): 18 min  Charges: OT General Charges $OT Visit: 1 Visit OT Treatments $Self Care/Home Management : 8-22 mins  Jolaine Artist, [place] Pager [phone] Office [phone]     Delight Stare 03/12/2019, 12:26 PM

## 2019-03-12 NOTE — Progress Notes (Signed)
Physical Therapy Treatment Patient Details Name: Alex Morrison MRN: [name] DOB: February 02, 1970 Today's Date: 03/12/2019    History of Present Illness 49 yo admitted after fall off loading dock at work with right fibula and tibial plateau fx s/p ORIF. PMHx: ESRD, DM, HTN    PT Comments    Pt supine on arrival with wet cotton gown but denied polyester gown. Pt reports feeling ok about going to parents house but also wanting to look at ALF for a few weeks. Pt educated for currently minguard status and ability to transfer to [facility] for increased mobility as well. PT encouraged to increase mobility and start getting OOB for all meals and toileting if possible to increase strength and endurance. Pt reports long term back and shoulder pain that limits him from activity at baseline as greatest limiting factor currently. Pt also educated for KI wear and postioning as pt with KI off and knee bent on arrival.     Follow Up Recommendations  SNF;Supervision for mobility/OOB;Home health PT(pending family assist)     Equipment Recommendations  Rolling walker with 5" wheels;Wheelchair (measurements PT);3in1 (PT)    Recommendations for Other Services       Precautions / Restrictions Precautions Precautions: Fall Required Braces or Orthoses: Knee Immobilizer - Right Knee Immobilizer - Right: On at all times Restrictions Weight Bearing Restrictions: Yes RLE Weight Bearing: Non weight bearing    Mobility  Bed Mobility         Supine to sit: Min guard;HOB elevated     General bed mobility comments: pt able to move RLE without assist and pivot to EOB  Transfers Overall transfer level: Needs assistance   Transfers: Sit to/from Stand Sit to Stand: Min guard         General transfer comment: cues for hand placement, RLE positioning and safety  Ambulation/Gait Ambulation/Gait assistance: Min guard Gait Distance (Feet): 50 Feet Assistive device: Rolling walker (2 wheeled) Gait  Pattern/deviations: Step-to pattern   Gait velocity interpretation: 1.31 - 2.62 ft/sec, indicative of limited community ambulator General Gait Details: cues for sequence with pt fatiguing quickly with hopping but maintaining NWB RLE throughout, KI on RLE. Pt propping on bil forearms grossly 3x during gait reporting back and shoulder pain   Stairs             Wheelchair Mobility    Modified Rankin (Stroke Patients Only)       Balance Overall balance assessment: Mild deficits observed, not formally tested                                          Cognition Arousal/Alertness: Awake/alert Behavior During Therapy: WFL for tasks assessed/performed Overall Cognitive Status: Within Functional Limits for tasks assessed                                        Exercises General Exercises - Lower Extremity Hip ABduction/ADduction: AROM;Right;Seated;10 reps Straight Leg Raises: AROM;Right;Seated;10 reps    General Comments        Pertinent Vitals/Pain Pain Score: 5  Pain Location: bil shoulders with gait Pain Descriptors / Indicators: Aching;Sore Pain Intervention(s): Limited activity within patient's tolerance;Monitored during session;Repositioned    Home Living  Prior Function            PT Goals (current goals can now be found in the care plan section) Progress towards PT goals: Progressing toward goals    Frequency    Min 5X/week      PT Plan Current plan remains appropriate    Co-evaluation              AM-PAC PT "6 Clicks" Mobility   Outcome Measure  Help needed turning from your back to your side while in a flat bed without using bedrails?: A Little Help needed moving from lying on your back to sitting on the side of a flat bed without using bedrails?: A Little Help needed moving to and from a bed to a chair (including a wheelchair)?: A Little Help needed standing up from a chair  using your arms (e.g., wheelchair or bedside chair)?: A Little Help needed to walk in hospital room?: A Little Help needed climbing 3-5 steps with a railing? : A Lot 6 Click Score: 17    End of Session Equipment Utilized During Treatment: Gait belt;Right knee immobilizer Activity Tolerance: Patient tolerated treatment well Patient left: in chair;with call bell/phone within reach;with chair alarm set Nurse Communication: Mobility status;Precautions;Weight bearing status PT Visit Diagnosis: Other abnormalities of gait and mobility (R26.89);Difficulty in walking, not elsewhere classified (R26.2)     Time: [id number] PT Time Calculation (min) (ACUTE ONLY): 23 min  Charges:  $Gait Training: 8-22 mins $Therapeutic Activity: 8-22 mins                     Vinh Sachs P, PT Acute Rehabilitation Services Pager: [phone] Office: [place] 03/12/2019, 12:14 PM

## 2019-03-12 NOTE — Progress Notes (Signed)
[facility] Progress Note   Subjective:   Seen in room - no CP/dyspnea or other complaints today. Placement pending. Did from with HD yesterday.  Objective Vitals:   03/11/19 1126 03/11/19 1240 03/12/19 0258 03/12/19 0811  BP: 137/68 108/61 130/67 129/64  Pulse: 96 96 91 85  Resp: 17 18 18 18   Temp: 98.2 F (36.8 C) 99.3 F (37.4 C) 98.5 F (36.9 C) 98.9 F (37.2 C)  TempSrc: Oral Oral Oral Oral  SpO2:  99% 98% 98%  Weight: 90.9 kg     Height:       Physical Exam General:Well appearing man, NAD Heart:RRR; no murmur Lungs:CTA anteriorly Abdomen:soft Extremities:R leg bandaged to lower knee, no LLE edema Dialysis Access:TDC+ maturing L AVF + thrill  Additional Objective Labs: Basic Metabolic Panel: Recent Labs  Lab 03/10/19 0251 03/11/19 0242 03/12/19 0249  NA 134* 136 133*  K 4.2 4.4 4.0  CL 93* 94* 95*  CO2 28 27 28   GLUCOSE 188* 126* 192*  BUN 33* 64* 41*  CREATININE 6.51* 9.36* 6.77*  CALCIUM 8.2* 8.1* 8.3*  PHOS 5.7* 7.2* 7.1*   Liver Function Tests: Recent Labs  Lab 03/10/19 0251 03/11/19 0242 03/12/19 0249  ALBUMIN 2.0* 1.9* 2.1*   CBC: Recent Labs  Lab 03/06/19 1800 03/07/19 0334 03/09/19 0430 03/09/19 0430 03/09/19 0738 03/09/19 0738 03/10/19 0251 03/11/19 0242 03/12/19 0249  WBC 8.7   < > 7.3   < > 7.2   < > 6.1 6.6 7.2  NEUTROABS 7.2  --   --   --   --   --   --   --   --   HGB 8.9*   < > 7.2*   < > 7.3*   < > 7.2* 7.3* 7.6*  HCT 28.7*   < > 23.8*   < > 24.4*   < > 23.5* 23.3* 24.5*  MCV 90.0   < > 91.9  --  93.1  --  90.7 87.6 88.1  PLT 318   < > 221   < > 242   < > 222 228 244   < > = values in this interval not displayed.   Medications: . ferric gluconate (FERRLECIT/NULECIT) IV Stopped (03/11/19 1024)  . methocarbamol (ROBAXIN) IV     . amLODipine  10 mg Oral Daily  . Chlorhexidine Gluconate Cloth  6 each Topical Q0600  . darbepoetin (ARANESP) injection - DIALYSIS  100 mcg Intravenous Q Wed-HD  .  docusate sodium  100 mg Oral BID  . doxercalciferol  4 mcg Intravenous Q M,W,F-HD  . feeding supplement (NEPRO CARB STEADY)  237 mL Oral BID BM  . feeding supplement (PRO-STAT SUGAR FREE 64)  30 mL Oral BID  . heparin  5,000 Units Subcutaneous Q8H  . insulin aspart  0-15 Units Subcutaneous TID WC  . insulin aspart  0-5 Units Subcutaneous QHS  . insulin aspart  2 Units Subcutaneous TID WC  . insulin glargine  10 Units Subcutaneous QHS  . multivitamin  1 tablet Oral QHS  . senna  1 tablet Oral BID  . sevelamer carbonate  2,400 mg Oral TID WC   Dialysis Orders: MWF at [facility] EDW94 kg,2K/2.25Ca,4hr,Heparin 3000,LUA AVF insert 01/13/19+ L IJ cath -Hectoral74mcg IV/HD -Mircera 75 mcgIVq 2 weeks (last on 02/16/19)  Assessment/Plan:  1. R tibial plateau + R distal Fibular Fx: S/pORIF on 03/07/2019 by Dr. Mardelle Matte. 2. ESRD: Continue HD per MWF sched -next HD 2/26. 3. Hypertension/volume: BP stable -  no edema. 4. Anemia: Hgb 7.6. Tsat11% (03/06/19)- IV iron was held d/t recent bacteremia, now resumed + Aranesp 1101mcg given 2/24. 5. Metabolic bone disease: Ca ok, Phos ^- continue Renvela as binder. 6. Nutrition: Alb low - continuenepro /pro stat 7. DM type 1 - per admit 8. Recent Strep bacteremiaadmit - felt d/t PD cath, removed, s/p Cefazolin thru 03/06/19   Veneta Penton, PA-C 03/12/2019, 10:17 AM  [facility]

## 2019-03-12 NOTE — Clinical Social Work Note (Signed)
Alex Morrison has offered a bed. CSW spoke with [facility] admissions coordinator to discuss insurance authorization.   Alex Morrison states that she is checking into this. TOC will continue to follow.

## 2019-03-13 LAB — CBC
HCT: 21.5 % — ABNORMAL LOW (ref 39.0–52.0)
HCT: 21.3 % — ABNORMAL LOW (ref 39.0–52.0)
Hemoglobin: 6.7 g/dL — CL (ref 13.0–17.0)
Hemoglobin: 6.6 g/dL — CL (ref 13.0–17.0)
MCH: 27.8 pg (ref 26.0–34.0)
MCH: 27.6 pg (ref 26.0–34.0)
MCHC: 31.2 g/dL (ref 30.0–36.0)
MCHC: 31 g/dL (ref 30.0–36.0)
MCV: 89.2 fL (ref 80.0–100.0)
MCV: 89.1 fL (ref 80.0–100.0)
Platelets: 232 10*3/uL (ref 150–400)
Platelets: 243 10*3/uL (ref 150–400)
RBC: 2.41 MIL/uL — ABNORMAL LOW (ref 4.22–5.81)
RBC: 2.39 MIL/uL — ABNORMAL LOW (ref 4.22–5.81)
RDW: 14.6 % (ref 11.5–15.5)
RDW: 14.7 % (ref 11.5–15.5)
WBC: 6.3 10*3/uL (ref 4.0–10.5)
WBC: 6.1 10*3/uL (ref 4.0–10.5)
nRBC: 0 % (ref 0.0–0.2)
nRBC: 0 % (ref 0.0–0.2)

## 2019-03-13 LAB — RENAL FUNCTION PANEL
Albumin: 2 g/dL — ABNORMAL LOW (ref 3.5–5.0)
Anion gap: 17 — ABNORMAL HIGH (ref 5–15)
BUN: 78 mg/dL — ABNORMAL HIGH (ref 6–20)
CO2: 26 mmol/L (ref 22–32)
Calcium: 8.4 mg/dL — ABNORMAL LOW (ref 8.9–10.3)
Chloride: 90 mmol/L — ABNORMAL LOW (ref 98–111)
Creatinine, Ser: 10.55 mg/dL — ABNORMAL HIGH (ref 0.61–1.24)
GFR calc Af Amer: 6 mL/min — ABNORMAL LOW (ref >60)
GFR calc non Af Amer: 5 mL/min — ABNORMAL LOW (ref >60)
Glucose, Bld: 254 mg/dL — ABNORMAL HIGH (ref 70–99)
Phosphorus: 8.2 mg/dL — ABNORMAL HIGH (ref 2.5–4.6)
Potassium: 4.4 mmol/L (ref 3.5–5.1)
Sodium: 133 mmol/L — ABNORMAL LOW (ref 135–145)

## 2019-03-13 LAB — GLUCOSE, CAPILLARY
Glucose-Capillary: 233 mg/dL — ABNORMAL HIGH (ref 70–99)
Glucose-Capillary: 178 mg/dL — ABNORMAL HIGH (ref 70–99)

## 2019-03-13 MED ORDER — OXYCODONE HCL 5 MG PO TABS
ORAL_TABLET | ORAL | Status: AC
  Filled 2019-03-13: qty 2

## 2019-03-13 MED ORDER — DOXERCALCIFEROL 4 MCG/2ML IV SOLN
INTRAVENOUS | Status: AC
  Administered 2019-03-13: 07:00:00 4 ug
  Filled 2019-03-13: qty 2

## 2019-03-13 MED ORDER — HEPARIN SODIUM (PORCINE) 1000 UNIT/ML IJ SOLN
INTRAMUSCULAR | Status: AC
  Administered 2019-03-13: 4000 [IU]
  Filled 2019-03-13: qty 1

## 2019-03-13 NOTE — Progress Notes (Signed)
PT Cancellation Note  Patient Details Name: ERYC KEITZ MRN: [name] DOB: 05/26/1970   Cancelled Treatment:    Reason Eval/Treat Not Completed: Patient at procedure or test/unavailable(HD)   Reakwon Barren B Saim Almanza 03/13/2019, 8:17 AM [name], PT Acute Rehabilitation Services Pager: [phone] Office: [phone]

## 2019-03-13 NOTE — Progress Notes (Signed)
Renal Navigator called patient in room again and he answered. He reports that he has transportation to HD from his parents house. When asked to confirm his transportation plan, he said, "I'll be close enough to the clinic to get SCAT or some other transportation because my parents live close to [employer]." Renal Navigator asked if he has applied for SCAT. He states he has not. Navigator asked for parents' address and offered to have application started today, informing patient that he will have to get someone to take him to HD on Monday, as it is doubtful that application will have time to be approved by the end of today. He reports that his mother or father will be able to take him on Monday. He does not know how to spell his parents' address and gave permission for Navigator to call his father. There was no answer.  Plan has been made for patient to discharge home today with understanding that his parents will provide transportation on Monday. He will follow up with OP HD Social Worker at [employer], whom Navigator has called today to update. She will complete and submit SCAT application once patient can provide address. Patient is understanding and agreeable to plan.  Alphonzo Cruise,  Renal Navigator [phone]

## 2019-03-13 NOTE — Progress Notes (Signed)
Hemoglobin 6.3, Patient isrefusing blood products, Juanell Fairly NP notified of above

## 2019-03-13 NOTE — Progress Notes (Signed)
[facility] Progress Note   Subjective: Pleasant, no C/Os. Seen on HD, tolerating well.     Objective Vitals:   03/13/19 0800 03/13/19 0830 03/13/19 0900 03/13/19 0915  BP: 118/60 127/63 (!) 114/55 (!) 117/53  Pulse: 83 85 84 84  Resp: 16     Temp:      TempSrc:      SpO2:      Weight:      Height:       Physical Exam General: WN,WD NAD Heart:S1,S2 RRR Lungs: CTAB Abdomen: S, NT Extremities: Posterior splint/knee immobilizer in place with ACE wrap RLE. No LLE edema Dialysis Access: Maturing L AVF + bruit. Developing well. TDC without issues.     Additional Objective Labs: Basic Metabolic Panel: Recent Labs  Lab 03/11/19 0242 03/12/19 0249 03/13/19 0727  NA 136 133* 133*  K 4.4 4.0 4.4  CL 94* 95* 90*  CO2 27 28 26   GLUCOSE 126* 192* 254*  BUN 64* 41* 78*  CREATININE 9.36* 6.77* 10.55*  CALCIUM 8.1* 8.3* 8.4*  PHOS 7.2* 7.1* 8.2*   Liver Function Tests: Recent Labs  Lab 03/11/19 0242 03/12/19 0249 03/13/19 0727  ALBUMIN 1.9* 2.1* 2.0*   No results for input(s): LIPASE, AMYLASE in the last 168 hours. CBC: Recent Labs  Lab 03/06/19 1800 03/07/19 0334 03/10/19 0251 03/10/19 0251 03/11/19 0242 03/11/19 0242 03/12/19 0249 03/13/19 0727 03/13/19 0831  WBC 8.7   < > 6.1   < > 6.6   < > 7.2 6.3 6.1  NEUTROABS 7.2  --   --   --   --   --   --   --   --   HGB 8.9*   < > 7.2*   < > 7.3*   < > 7.6* 6.7* 6.6*  HCT 28.7*   < > 23.5*   < > 23.3*   < > 24.5* 21.5* 21.3*  MCV 90.0   < > 90.7  --  87.6  --  88.1 89.2 89.1  PLT 318   < > 222   < > 228   < > 244 232 243   < > = values in this interval not displayed.   Blood Culture    Component Value Date/Time   SDES URINE, CATHETERIZED 09/28/2013 1034   SPECREQUEST NONE 09/28/2013 1034   CULT NO GROWTH Performed at [facility] 09/28/2013 1034   REPTSTATUS 09/29/2013 FINAL 09/28/2013 1034    Cardiac Enzymes: No results for input(s): CKTOTAL, CKMB, CKMBINDEX, TROPONINI in the  last 168 hours. CBG: Recent Labs  Lab 03/12/19 0807 03/12/19 1157 03/12/19 1726 03/12/19 2120 03/13/19 0616  GLUCAP 177* 188* 132* 161* 233*   Iron Studies: No results for input(s): IRON, TIBC, TRANSFERRIN, FERRITIN in the last 72 hours. @lablastinr3 @ Studies/Results: No results found. Medications: . ferric gluconate (FERRLECIT/NULECIT) IV 125 mg (03/13/19 0915)  . methocarbamol (ROBAXIN) IV     . amLODipine  10 mg Oral Daily  . Chlorhexidine Gluconate Cloth  6 each Topical Q0600  . darbepoetin (ARANESP) injection - DIALYSIS  100 mcg Intravenous Q Wed-HD  . docusate sodium  100 mg Oral BID  . doxercalciferol  4 mcg Intravenous Q M,W,F-HD  . feeding supplement (NEPRO CARB STEADY)  237 mL Oral BID BM  . feeding supplement (PRO-STAT SUGAR FREE 64)  30 mL Oral BID  . heparin      . heparin  5,000 Units Subcutaneous Q8H  . insulin aspart  0-15 Units Subcutaneous TID  WC  . insulin aspart  0-5 Units Subcutaneous QHS  . insulin aspart  2 Units Subcutaneous TID WC  . insulin glargine  10 Units Subcutaneous QHS  . multivitamin  1 tablet Oral QHS  . oxyCODONE      . senna  1 tablet Oral BID  . sevelamer carbonate  2,400 mg Oral TID WC     Dialysis Orders: MWF at [facility] EDW94 kg,2K/2.25Ca,4hr,LUA AVF insert 01/13/19+ L IJ cath -Heparin 3000 Units IV TIW -Hectoral72mcg IV TIW -Mircera 75 mcgIVq 2 weeks (last on 02/16/19)  Assessment/Plan:  1. R tibial plateau + R distal Fibular Fx: S/pORIF on 03/07/2019 by Dr. Mardelle Matte. 2. ESRD: Continue HD per MWF sched -On HD tolerating well. Using [facility] without issues.  3. Hypertension/volume: BP stable - no edema. Pre wt 91.8 kg. UFG 1.5 BP stable.  4. Anemia: Hgb 7.6 02/25 Now 6.6. Refusing transfusion for fear of antibodies interfering with transplant status.  Tsat11% (03/06/19)- IV iron was held d/t recent bacteremia, now resumed + Aranesp 130mcg given 2/24. He is asymptomatic. Follow HGB.  5. Metabolic bone disease: Ca ok, Phos  ^- continue Renvela as binder. 6. Nutrition: Alb low - continuenepro /pro stat 7. DM type 1 - per admit 8. Recent Strep bacteremiaadmit - felt d/t PD cath, removed, s/p Cefazolin thru 03/06/19 9. Disposition: Going to [facility]. Plans to move to parents house in [place] from [place]. Have contracted Nepho MSW about transportation issues. Cleared for DC by Ortho. Stable for DC per nephrology stand point.   Natisha Trzcinski H. Inez Rosato NP-C 03/13/2019, 9:25 AM  [employer] [phone]

## 2019-03-13 NOTE — Discharge Summary (Signed)
Physician Discharge Summary  Alex Morrison [id number] DOB: Jan 29, 1970 DOA: 03/06/2019  PCP: Haywood Pao, MD  Admit date: 03/06/2019 Discharge date: 03/13/2019  Admitted From: Home. Disposition: Home with parents.  Recommendations for Outpatient Follow-up:  1. Follow ups as below. 2. Please obtain CBC/BMP/Mag at follow up 3. Please follow up on the following pending results: None  Home Health: PT/OT Equipment/Devices: Wheelchair, rolling walker and 3 in 1 commode  Discharge Condition: Stable CODE STATUS: Full code  Follow-up Information    Marchia Bond, MD. Schedule an appointment as soon as possible for a visit in 1 week.   Specialty: Orthopedic Surgery Contact information: [place] [address] [phone]           Hospital Course: 49 y.o.malewith history ofESRD on HD MWF, DM-1, HTN and recent hospitalization at [facility] from 2/9-2/15 with Streptococcus mitis bacteremia presenting with mechanical fall from loading dock about 4 feet high and subsequent right tibial plateau fracture and right distal fibula fracture.  Orthopedic surgery consulted, and planning surgical management of right tibial plateau fracture.  Nephrology consulted for HD.  Patient underwent ORIF of right tibial plateau fracture and right distal fibula fracture on 03/06/2018 by Dr. Mardelle Matte.  Patient will be nonweightbearing on RLE for 6 to 8 weeks. Initially, patient and family concerned about patient going home. SNF search initiated.  Patient was accepted to [facility] and insurance authorization started.  However, patient changed his mind and decided to go home and stay with his parents.  On the day of discharge, patient's hemoglobin dropped to 6.7.  He repeatedly refused blood transfusion despite risk-benefit discussion.  Repeat hemoglobin 6.6.  Patient was not symptomatic from anemia.  He denies melena or hematochezia.  See individual problems below for more  hospital course.  Discharge Diagnoses:  Mechanical fall from loading dock about 4 feet high Comminuted, displaced and depressed fracture lateral tibial plateau associated with hemarthrosis Displaced fracture of the distal right fibula-noted on x-ray. -ORIF of right tibial plateau fracture and right distal fibula fracture on 03/07/2019 by Dr. Mardelle Matte. -Pain control per orthopedic surgeon-pain seems to have been controlled on oral regimen. -ASA 81 mg twice daily for DVT prophylaxis -Per Ortho, NWB on RLE for 6 to 8 weeks, VTE ppx for a month and outpt follow up in 2 weeks -Home health PT/OT and appropriate DME ordered.  UncontrolledDM-1: A1c 9.2%.  On Lantus 10 units nightly and SSI at home.  He did not receive his Lantus last night. Recent Labs    03/12/19 2120 03/13/19 0616 03/13/19 1156  GLUCAP 161* 233* 178*  -Discharged on home insulin.  ESRD on HD MWF/BMD: -Per nephrology  Anemia of renal disease: Baseline Hgb 8-9> 8.9 (admit)> 7.5> 7.2> 7.6> 6.7> 6.6.  Anemia panel consistent with both IDA and ACD.  Denies melena or hematochezia.  Repeatedly refused blood transfusion.  He is not symptomatic from this. -ESA and iron infusion per nephrology.  Essential hypertension: BP fairly controlled. -Continue home amlodipine  Streptococcus mitis bacteremia-hospitalized for this at [facility] 2/9-2/15 and discharged on Ancef with HDthat he completed on 2/19.Bacteremia thought to be from HD cath. HD cath replaced. TTE reportedly negative for vegetation.  Constipation: Resolved. -Continue MiraLAX and Senokot-S  Discharge Instructions  Discharge Instructions    Call MD for:  extreme fatigue   Complete by: As directed    Call MD for:  persistant dizziness or light-headedness   Complete by: As directed  Call MD for:  redness, tenderness, or signs of infection (pain, swelling, redness, odor or green/yellow discharge around incision site)   Complete by: As directed     Call MD for:  severe uncontrolled pain   Complete by: As directed    Call MD for:  temperature >100.4   Complete by: As directed    Diet - low sodium heart healthy   Complete by: As directed    Diet Carb Modified   Complete by: As directed    Discharge instructions   Complete by: As directed    It has been a pleasure taking care of you! You were hospitalized after a fall and leg fracture.  You were treated surgically.  You surgeons recommended not to bear weight on your right leg for 6 to 8 weeks.  Please follow-up with your surgeon in 2 weeks.  Please review your new medication list and the directions before you take your medications.   Take care,   Increase activity slowly   Complete by: As directed      Allergies as of 03/13/2019   No Known Allergies     Medication List    TAKE these medications   acetaminophen 500 MG tablet Commonly known as: TYLENOL Take 2 tablets (1,000 mg total) by mouth every 8 (eight) hours as needed for mild pain, moderate pain or fever. What changed: how much to take   amLODipine 10 MG tablet Commonly known as: NORVASC Take 10 mg by mouth daily.   aspirin EC 81 MG tablet Take 1 tablet (81 mg total) by mouth every 12 (twelve) hours.   Lantus SoloStar 100 UNIT/ML Solostar Pen Generic drug: Insulin Glargine Inject 10 Units into the skin at bedtime.   NovoLOG FlexPen 100 UNIT/ML FlexPen Generic drug: insulin aspart Inject 3-9 Units into the skin 3 (three) times daily with meals.   oxyCODONE 5 MG immediate release tablet Commonly known as: Roxicodone Take 1 tablet (5 mg total) by mouth every 4 (four) hours as needed for severe pain.   senna-docusate 8.6-50 MG tablet Commonly known as: Senokot-S Take 1 tablet by mouth 2 (two) times daily between meals as needed for mild constipation.   sennosides-docusate sodium 8.6-50 MG tablet Commonly known as: SENOKOT-S Take 2 tablets by mouth daily.   sevelamer carbonate 800 MG tablet Commonly  known as: RENVELA Take 800-2,400 mg by mouth See admin instructions. Take 3 tablets (2400 mg) by mouth two times daily with meals, take 1-2 tablets ([phone] mg) with snacks.            Durable Medical Equipment  (From admission, onward)         Start     Ordered   03/10/19 1616  For home use only DME standard manual wheelchair with seat cushion  Once    Comments: Patient suffers from right leg fracture which impairs their ability to perform daily activities like bathing and toileting in the home.  A walker will not resolve issue with performing activities of daily living. A wheelchair will allow patient to safely perform daily activities. Patient can safely propel the wheelchair in the home or has a caregiver who can provide assistance. Length of need 6 months . Accessories: elevating leg rests (ELRs), wheel locks, extensions and anti-tippers.   03/10/19 1616   03/10/19 1615  For home use only DME Walker rolling  Once    Question Answer Comment  Walker: With Watson   Patient needs a walker to treat with  the following condition Leg fracture, right      03/10/19 1616   03/10/19 1610  For home use only DME Bedside commode  Once    Comments: 3 in 1  Question:  Patient needs a bedside commode to treat with the following condition  Answer:  Leg fracture, right   03/10/19 1616          Consultations:  Nephrology  Orthopedic surgery  Procedures/Studies:  Hemodialysis  ORIF for right tibial plateau fracture and right distal fibula fracture on 03/07/2019   DG Tibia/Fibula Right  Result Date: 03/07/2019 CLINICAL DATA:  Fractures of the tibial to plateau and lateral malleolus EXAM: RIGHT TIBIA AND FIBULA - 2 VIEW COMPARISON:  CT knee and ankle radiographs dated 03/06/2019 FINDINGS: Intraoperative fluoroscopy images demonstrate open reduction internal fixation with plate and screws for a lateral tibial plateau fracture which is in improved alignment. The patient is also  status post open reduction internal fixation with plate and screws for a lateral malleolus fracture which is in improved alignment. There is no evidence of hardware failure. IMPRESSION: Status post open reduction internal fixation of lateral tibial plateau and lateral malleolus fractures. No evidence of hardware failure. Electronically Signed   By: Zerita Boers M.D.   On: 03/07/2019 19:57   DG Ankle Complete Right  Result Date: 03/07/2019 CLINICAL DATA:  Postop. EXAM: RIGHT ANKLE - COMPLETE 3+ VIEW COMPARISON:  03/06/2019 FINDINGS: The patient is now status post plate screw fixation of the distal fibula. Overlying skin staples are noted. There is surrounding soft tissue swelling. The osseous alignment is improved. There is an overlying plaster splint that obscures bony details. IMPRESSION: Status post plate and screw fixation of the distal fibula with improved osseous alignment. Electronically Signed   By: Constance Holster M.D.   On: 03/07/2019 22:24   DG Ankle Complete Right  Result Date: 03/06/2019 CLINICAL DATA:  Fall. EXAM: RIGHT ANKLE - COMPLETE 3+ VIEW COMPARISON:  No recent prior. FINDINGS: Soft tissue swelling over the lateral malleolus. Slightly displaced fracture noted of the distal right fibula. Medial malleolus appears intact. Peripheral vascular calcification. IMPRESSION: 1. Prominent soft tissue swelling over the lateral malleolus. Slightly displaced fracture of the distal right fibula noted. 2.  Peripheral vascular disease. Electronically Signed   By: Marcello Moores  Register   On: 03/06/2019 16:12   CT Knee Right Wo Contrast  Result Date: 03/06/2019 CLINICAL DATA:  The patient suffered a right tibial plateau fracture due to a fall off a wall today. Initial encounter. EXAM: CT OF THE RIGHT KNEE WITHOUT CONTRAST TECHNIQUE: Multidetector CT imaging of the right knee was performed according to the standard protocol. Multiplanar CT image reconstructions were also generated. COMPARISON:  Plain films  right knee earlier today. FINDINGS: Bones/Joint/Cartilage As seen on the comparison plain films, the patient has a comminuted, depressed lateral plateau fracture. The articular surface is divided into 3 main fragments. Depression is up to 1.1 cm. There is a floating fragment the periphery of the articular surface which measures up to 1.5 cm transverse at the articular surface by 3.6 cm craniocaudal by 5.4 cm AP. This fragment is laterally displaced approximately 1 cm. Fracture includes a nondisplaced component which extends through the medial tibial eminence and into the medial plateau. The fibula is intact.  No other fracture is identified. Ligaments Suboptimally assessed by CT. The cruciate and collateral ligaments appear intact. The lateral meniscus is avulsed from the tibia. Muscles and Tendons Intact. Soft tissues Large lipo atherosclerosis also noted.  Hemarthrosis. IMPRESSION: Comminuted and depressed lateral tibial plateau fracture most consistent with a Schatzker type 2 injury. As noted above, nondisplaced fracture line extends into the medial tibial plateau. Electronically Signed   By: Inge Rise M.D.   On: 03/06/2019 17:55   DG Knee Complete 4 Views Right  Result Date: 03/07/2019 CLINICAL DATA:  Postop. EXAM: RIGHT KNEE - COMPLETE 4+ VIEW COMPARISON:  03/06/2019 FINDINGS: The patient is now status post plate screw fixation of the proximal tibia. The alignment is improved. Overlying skin staples are noted. There is overlying soft tissue swelling. There is no evidence for hardware fracture or failure. IMPRESSION: Status post plate and screw fixation of the proximal tibia with improved alignment. Electronically Signed   By: Constance Holster M.D.   On: 03/07/2019 22:25   DG Knee Complete 4 Views Right  Result Date: 03/06/2019 CLINICAL DATA:  Patient reports right anterior and lateral knee pains after approximate 4 ft fall off a loading dock today. Reports unable to bear weight on right knee.  Denies any prior right knee injury or surgery. Hx of diabetes. EXAM: RIGHT KNEE - COMPLETE 4+ VIEW COMPARISON:  None. FINDINGS: Comminuted, mildly displaced and depressed fracture of the lateral tibial plateau. Maximum depression of a component of the fracture is 1.2 cm from the expected location of the articular surface. Fractures also mildly displaced laterally by approximately 1 cm. A component of the fracture extends to the medial margin of the tibial spine. No other fractures.  No dislocation. Fat fluid level reflecting a hemarthrosis distends the suprapatellar joint capsule. There are arterial atherosclerotic calcifications posteriorly, advanced for age. IMPRESSION: 1. Comminuted, displaced and depressed fracture of the lateral tibial plateau associated with a hemarthrosis. No dislocation. Electronically Signed   By: Lajean Manes M.D.   On: 03/06/2019 15:37   DG C-Arm 1-60 Min  Result Date: 03/07/2019 CLINICAL DATA:  Status post fixation of lateral malleolus and lateral tibial plateau fractures. EXAM: DG C-ARM 1-60 MIN CONTRAST:  None FLUOROSCOPY TIME:  Fluoroscopy Time:  1 minutes 28 seconds Radiation Exposure Index (if provided by the fluoroscopic device): Not applicable Number of Acquired Spot Images: 0 COMPARISON:  CT knee and ankle radiographs dated 03/06/2019 FINDINGS: Intraoperative fluoroscopy images demonstrate open reduction internal fixation with plate and screws for a lateral tibial plateau fracture which is in improved alignment. The patient is also status post open reduction internal fixation with plate and screws for a lateral malleolus fracture which is in improved alignment. There is no evidence of hardware failure. IMPRESSION: Status post open reduction internal fixation of lateral tibial plateau and lateral malleolus fractures. Electronically Signed   By: Zerita Boers M.D.   On: 03/07/2019 19:58   VAS US DUPLEX DIALYSIS ACCESS (AVF,AVG)  Result Date: 02/19/2019 DIALYSIS ACCESS  Reason for Exam: Routine follow up. Access Site: Left Upper Extremity. Access Type: Brachial-cephalic AVF placed [id number]. Performing Technologist: Alvia Grove RVT  Examination Guidelines: A complete evaluation includes B-mode imaging, spectral Doppler, color Doppler, and power Doppler as needed of all accessible portions of each vessel. Unilateral testing is considered an integral part of a complete examination. Limited examinations for reoccurring indications may be performed as noted.  Findings: +--------------------+----------+-----------------+------------------+ AVF                 PSV (cm/s)Flow Vol (mL/min)     Comments      +--------------------+----------+-----------------+------------------+ Native artery inflow   226           975                          +--------------------+----------+-----------------+------------------+  AVF Anastomosis        849                     small and tortuous +--------------------+----------+-----------------+------------------+  +------------+----------+-------------+----------+--------------------+ OUTFLOW VEINPSV (cm/s)Diameter (cm)Depth (cm)      Describe       +------------+----------+-------------+----------+--------------------+ Prox UA         92        0.69        0.23                        +------------+----------+-------------+----------+--------------------+ Mid UA         109        0.57        0.24   competing branch x 2 +------------+----------+-------------+----------+--------------------+ Dist UA        109        0.67        0.25                        +------------+----------+-------------+----------+--------------------+ AC Fossa        43        0.74        0.23                        +------------+----------+-------------+----------+--------------------+ Two branches in the mid upper arm measuring 0.24 cms (with a velocity of 130 cm) & 0.26 (with a velocity of 71 cm/s).   Summary: Patent arteriovenous  fistula with increased velocity at the anastamosos *See table(s) above for measurements and observations.  Diagnosing physician: Ruta Hinds MD Electronically signed by Ruta Hinds MD on 02/19/2019 at 12:52:07 PM.   --------------------------------------------------------------------------------   Final        Discharge Exam: Vitals:   03/13/19 1055 03/13/19 1359  BP: 135/68 (!) 117/58  Pulse: 90 91  Resp: 18 18  Temp: 99.2 F (37.3 C) 99.9 F (37.7 C)  SpO2: 96% 97%    GENERAL: No acute distress.  Appears well.  HEENT: MMM.  Vision and hearing grossly intact.  NECK: Supple.  No apparent JVD.  RESP:  No IWOB. Good air movement bilaterally. CVS:  RRR. Heart sounds normal.  ABD/GI/GU: Bowel sounds present. Soft. Non tender.  MSK/EXT: Dressing and Ace wrap over RLE.  Neurovascular intact in his toes. SKIN: no apparent skin lesion or wound NEURO: Awake, alert and oriented appropriately.  No apparent focal neuro deficit. PSYCH: Calm. Normal affect.   The results of significant diagnostics from this hospitalization (including imaging, microbiology, ancillary and laboratory) are listed below for reference.     Microbiology: Recent Results (from the past 240 hour(s))  SARS CORONAVIRUS 2 (TAT 6-24 HRS) Nasopharyngeal Nasopharyngeal Swab     Status: None   Collection Time: 03/06/19  4:39 PM   Specimen: Nasopharyngeal Swab  Result Value Ref Range Status   SARS Coronavirus 2 NEGATIVE NEGATIVE Final    Comment: (NOTE) SARS-CoV-2 target nucleic acids are NOT DETECTED. The SARS-CoV-2 RNA is generally detectable in upper and lower respiratory specimens during the acute phase of infection. Negative results do not preclude SARS-CoV-2 infection, do not rule out co-infections with other pathogens, and should not be used as the sole basis for treatment or other patient management decisions. Negative results must be combined with clinical observations, patient history, and  epidemiological information. The expected result is Negative. Fact Sheet for Patients: SugarRoll.be Fact Sheet for Healthcare  Providers: https://www.woods-mathews.com/ This test is not yet approved or cleared by the [place] and  has been authorized for detection and/or diagnosis of SARS-CoV-2 by FDA under an Emergency Use Authorization (EUA). This EUA will remain  in effect (meaning this test can be used) for the duration of the COVID-19 declaration under Section 56 4(b)(1) of the Act, 21 U.S.C. section 360bbb-3(b)(1), unless the authorization is terminated or revoked sooner. Performed at [facility], [place] [address], [place], [place] [address]   Surgical pcr screen     Status: None   Collection Time: 03/06/19 10:09 PM   Specimen: Nasal Mucosa; Nasal Swab  Result Value Ref Range Status   MRSA, PCR NEGATIVE NEGATIVE Final   Staphylococcus aureus NEGATIVE NEGATIVE Final    Comment: (NOTE) The Xpert SA Assay (FDA approved for NASAL specimens in patients 26 years of age and older), is one component of a comprehensive surveillance program. It is not intended to diagnose infection nor to guide or monitor treatment. Performed at [facility], [place] [address], [place], [place] [address]      Labs: BNP (last 3 results) No results for input(s): BNP in the last 8760 hours. Basic Metabolic Panel: Recent Labs  Lab 03/08/19 0412 03/08/19 0412 03/09/19 0430 03/10/19 0251 03/11/19 0242 03/12/19 0249 03/13/19 0727  NA 137   < > 137 134* 136 133* 133*  K 4.3   < > 4.2 4.2 4.4 4.0 4.4  CL 98   < > 94* 93* 94* 95* 90*  CO2 28   < > 28 28 27 28 26   GLUCOSE 219*   < > 226* 188* 126* 192* 254*  BUN 31*   < > 48* 33* 64* 41* 78*  CREATININE 7.51*   < > 9.46* 6.51* 9.36* 6.77* 10.55*  CALCIUM 7.8*   < > 8.2* 8.2* 8.1* 8.3* 8.4*  MG 2.0  --  2.2 1.7 1.7 2.0  --   PHOS 5.8*   < > 6.9* 5.7* 7.2* 7.1* 8.2*   < > = values in this  interval not displayed.   Liver Function Tests: Recent Labs  Lab 03/09/19 0430 03/10/19 0251 03/11/19 0242 03/12/19 0249 03/13/19 0727  ALBUMIN 2.0* 2.0* 1.9* 2.1* 2.0*   No results for input(s): LIPASE, AMYLASE in the last 168 hours. No results for input(s): AMMONIA in the last 168 hours. CBC: Recent Labs  Lab 03/06/19 1800 03/07/19 0334 03/10/19 0251 03/11/19 0242 03/12/19 0249 03/13/19 0727 03/13/19 0831  WBC 8.7   < > 6.1 6.6 7.2 6.3 6.1  NEUTROABS 7.2  --   --   --   --   --   --   HGB 8.9*   < > 7.2* 7.3* 7.6* 6.7* 6.6*  HCT 28.7*   < > 23.5* 23.3* 24.5* 21.5* 21.3*  MCV 90.0   < > 90.7 87.6 88.1 89.2 89.1  PLT 318   < > 222 228 244 232 243   < > = values in this interval not displayed.   Cardiac Enzymes: No results for input(s): CKTOTAL, CKMB, CKMBINDEX, TROPONINI in the last 168 hours. BNP: Invalid input(s): POCBNP CBG: Recent Labs  Lab 03/12/19 1157 03/12/19 1726 03/12/19 2120 03/13/19 0616 03/13/19 1156  GLUCAP 188* 132* 161* 233* 178*   D-Dimer No results for input(s): DDIMER in the last 72 hours. Hgb A1c No results for input(s): HGBA1C in the last 72 hours. Lipid Profile No results for input(s): CHOL, HDL, LDLCALC, TRIG, CHOLHDL,  LDLDIRECT in the last 72 hours. Thyroid function studies No results for input(s): TSH, T4TOTAL, T3FREE, THYROIDAB in the last 72 hours.  Invalid input(s): FREET3 Anemia work up No results for input(s): VITAMINB12, FOLATE, FERRITIN, TIBC, IRON, RETICCTPCT in the last 72 hours. Urinalysis    Component Value Date/Time   COLORURINE STRAW (A) 08/07/2018 2328   APPEARANCEUR CLEAR 08/07/2018 2328   LABSPEC 1.010 08/07/2018 2328   PHURINE 6.0 08/07/2018 2328   GLUCOSEU >=500 (A) 08/07/2018 2328   HGBUR SMALL (A) 08/07/2018 2328   BILIRUBINUR NEGATIVE 08/07/2018 2328   KETONESUR NEGATIVE 08/07/2018 2328   PROTEINUR 100 (A) 08/07/2018 2328   UROBILINOGEN 0.2 09/28/2013 1034   NITRITE NEGATIVE 08/07/2018 2328    LEUKOCYTESUR NEGATIVE 08/07/2018 2328   Sepsis Labs Invalid input(s): PROCALCITONIN,  WBC,  LACTICIDVEN   Time coordinating discharge: 35 minutes  SIGNED:  Mercy Riding, MD  [facility] 03/13/2019, 2:54 PM  If 7PM-7AM, please contact night-coverage [email or web address] Password [email or web address]

## 2019-03-13 NOTE — Progress Notes (Signed)
     Subjective: 6 Days Post-Op s/p Procedure(s): OPEN REDUCTION INTERNAL FIXATION (ORIF) FIBULA FRACTURE Open Reduction Internal Fixation (Orif) Tibial Plateau   Patient is alert, oriented and oriented, states pain is moderate but improving. Denies numbness and tingling in right lower extremity. Denies chest pain, SOB, Calf pain. No nausea/vomiting. Patient indicates that he would like to go home rather than SNF as he feels he is getting stronger and thinks he can have a ride to HD.     Objective:  PE: VITALS:   Vitals:   03/13/19 0409 03/13/19 0650 03/13/19 0655 03/13/19 0730  BP: 138/73 132/70 127/68 116/63  Pulse: 91 86 86 84  Resp: 16 17 16 15   Temp: 99 F (37.2 C) 99.2 F (37.3 C)    TempSrc: Oral Oral    SpO2: 100% 100%    Weight:  91.8 kg    Height:        General: alert, in no acute distress Abdomen: soft, non-tender MSK:  RLE: posterior splint and knee immobilizer intact. Dressings in place with mild strike throughat tibial dressing. Able to move all toes of right foot. Distal sensation intact. Right foot warm and well perfused. Compartments soft, compressible.  LABS  Results for orders placed or performed during the hospital encounter of 03/06/19 (from the past 24 hour(s))  Glucose, capillary     Status: Abnormal   Collection Time: 03/12/19  8:07 AM  Result Value Ref Range   Glucose-Capillary 177 (H) 70 - 99 mg/dL  Glucose, capillary     Status: Abnormal   Collection Time: 03/12/19 11:57 AM  Result Value Ref Range   Glucose-Capillary 188 (H) 70 - 99 mg/dL  Glucose, capillary     Status: Abnormal   Collection Time: 03/12/19  5:26 PM  Result Value Ref Range   Glucose-Capillary 132 (H) 70 - 99 mg/dL  Glucose, capillary     Status: Abnormal   Collection Time: 03/12/19  9:20 PM  Result Value Ref Range   Glucose-Capillary 161 (H) 70 - 99 mg/dL  Glucose, capillary     Status: Abnormal   Collection Time: 03/13/19  6:16 AM  Result Value Ref Range   Glucose-Capillary 233 (H) 70 - 99 mg/dL    No results found.  Assessment/Plan: Active Problems:   Tibial plateau fracture    6 Days Post-Op s/p Procedure(s): OPEN REDUCTION INTERNAL FIXATION (ORIF) FIBULA FRACTURE Open Reduction Internal Fixation (Orif) Tibial Plateau  Weightbearing:Non-weight bearing in RLE for 6-8 weeks post-operatively.patient to continue use of knee immobilizer for approx. 6 weeks. Up with therapy Insicional and dressing care:PRN dressing changes Orthopedic device(s):Continue posterior splint and knee immobilizer. Ace-wrap in place to help with swelling, can remove ace-wrap prior to discharge VTE prophylaxis:heparinwhile in hospital, plan on aspirin at discharge, total of one month of post-operative VTE prophylaxis Pain control:continue current prn pain control regimen Follow - up plan:Plan to follow-up with Dr. Mardelle Matte 2 weeks after discharge. Disposition:ok for discharge, patient planning to go home rather than SNF, Face to Face orders for home health nursing and PT placed Contact information:   Weekdays 8-5 Merlene Pulling, PA-C [phone] A fter hours and holidays please check [email or web address] for group call information for [facility]  Ventura Bruns 03/13/2019, 7:41 AM

## 2019-03-13 NOTE — Clinical Social Work Note (Addendum)
Update 3:25pm Very difficult dispo. Patient has not been honest with his parents as it relates to conversations with [facility] staff. Patient has stated that he is able to return home with parents. However, CSW has received several calls from patient's parents stating that this is not true. Patient continues to state that he will be discharging home with his parents. CSW has had DME delivered to patient's room in the event he does discharge home. Per unit RN Marissa patient is ambulating fairly well and does not require any assistance from nursing staff. Does not appear to need SNF placement at this time.   CSW informed by RN that patient's mother is stating that patient should be going to SNF instead of home. CSW has spoken with patient to discuss SNF versus home as evidenced by notes in epic.   CSW has been working with [facility] to get patient admitted. However, this AM CSW informed by RN that patient has changed his mind and would like to go home with home health.   CSW, renal navigator, and RN have discussed dispo with patient. Patient has stated many times to several medical team members that he would like to dc home with parents. DME has been ordered and will be delivered to patient's room.   CSW working on getting home health as patient's payor source is difficult to accept.

## 2019-03-13 NOTE — Progress Notes (Signed)
Renal Navigator read documentation that patient had been given a bed offer at [facility], but recently received call from CSW/S. Wicker that he no longer wants SNF and plans to go home to parents' home. Renal Navigator is concerned about transportation to OP HD and wants to confirm patient's plans. Renal Navigator has attempted to call patient numerous times on cell phone (number listed in Epic) and room phone, but patient is not answering. Navigator called bedside RN and she stated (approximately 45 minutes ago) that patient was in the bathroom. Patient is still not answering his phone. Navigator will keep trying.  Alphonzo Cruise,  Renal Navigator [phone]

## 2019-03-13 NOTE — Progress Notes (Addendum)
1200 Pt decided to go home instead of going to SNF. Dr Cyndia Skeeters and Fabio Pierce SW was notified. Discharge instructions was given to the pt, verbalized understanding. Discharged to home accompanied by his father. All DME's was provided and meds from [place] was delivered to the pt.

## 2019-03-13 NOTE — TOC Transition Note (Signed)
Transition of Care [facility]) - CM/SW Discharge Note   Patient Details  Name: KORDE HOPKIN MRN: [name] Date of Birth: 1970/12/03  Transition of Care [facility]) CM/SW Contact:  Atilano Median, LCSW Phone Number: 03/13/2019, 4:14 PM   Clinical Narrative:    Discharged home. DME delivered to room prior to discharge. Home health referral still in the works due to payor source and office branch requesting more information.   Patient and family not on one accord about dispo plan. No other needs at this time. CSW advised patient's parents that patient has made this decision and he is able to do so due to him being alert and oriented. Patient states that his dad will transport him to HD on Monday and the HD clinic SW will assist him with arranging SCAT for future HD appointments.   Case closed to this CSW.    Final next level of care: Home/Self Care Barriers to Discharge: Barriers Resolved   Patient Goals and CMS Choice Patient states their goals for this hospitalization and ongoing recovery are:: be able to return home to get back to my regular scheduled program CMS Medicare.gov Compare Post Acute Care list provided to:: Patient Choice offered to / list presented to : Patient  Discharge Placement                       Discharge Plan and Services In-house Referral: Clinical Social Work   Post Acute Care Choice: [place]          DME Arranged: Wheelchair manual, 3-N-1, Walker rolling DME Agency: [employer] Date DME Agency Contacted: 03/13/19 Time DME Agency Contacted: [phone] Representative spoke with at DME Agency: [place]: PT, OT [place] Agency: [facility] (formerly [employer]) Date [place]: 03/13/19 Time [place]: [place] Representative spoke with at [place]: [place] ([place]) Interventions     Readmission Risk Interventions No flowsheet data found.

## 2019-03-14 ENCOUNTER — Telehealth (HOSPITAL_COMMUNITY): Payer: Self-pay | Admitting: Nephrology

## 2019-03-14 NOTE — Telephone Encounter (Signed)
Transition of care contact from inpatient facility  Date of Discharge: 03/13/19 Date of Contact: 03/14/19 -- attempted Method of contact: Phone  Attempted to contact patient to discuss transition of care from inpatient admission. Patient did not answer the phone. Message was left on the patient's voicemail with call back number [phone]. \  Veneta Penton, PA-C [employer] Pager [phone]

## 2019-03-15 DIAGNOSIS — Z992 Dependence on renal dialysis: Secondary | ICD-10-CM | POA: Diagnosis not present

## 2019-03-15 DIAGNOSIS — N186 End stage renal disease: Secondary | ICD-10-CM | POA: Diagnosis not present

## 2019-03-15 DIAGNOSIS — E1122 Type 2 diabetes mellitus with diabetic chronic kidney disease: Secondary | ICD-10-CM | POA: Diagnosis not present

## 2019-03-16 ENCOUNTER — Telehealth: Payer: Self-pay | Admitting: Physician Assistant

## 2019-03-16 DIAGNOSIS — N186 End stage renal disease: Secondary | ICD-10-CM | POA: Diagnosis not present

## 2019-03-16 DIAGNOSIS — N2581 Secondary hyperparathyroidism of renal origin: Secondary | ICD-10-CM | POA: Diagnosis not present

## 2019-03-16 DIAGNOSIS — Z992 Dependence on renal dialysis: Secondary | ICD-10-CM | POA: Diagnosis not present

## 2019-03-16 NOTE — Telephone Encounter (Signed)
Transition of care contact from inpatient facility  Date of discharge: 03/13/19 Date of contact: 03/16/19 Method of contact: Phone  Attempted to contact patient to discuss transition of care from in patient facility. Patient did not answer the phone. Message was left of the patient's voicemail with call back instructions.  Anice Paganini, PA-C 03/16/2019, 12:34 PM  [facility] Pager: [phone]

## 2019-03-17 ENCOUNTER — Telehealth: Payer: Self-pay | Admitting: Physician Assistant

## 2019-03-17 DIAGNOSIS — I120 Hypertensive chronic kidney disease with stage 5 chronic kidney disease or end stage renal disease: Secondary | ICD-10-CM | POA: Diagnosis not present

## 2019-03-17 DIAGNOSIS — Z794 Long term (current) use of insulin: Secondary | ICD-10-CM | POA: Diagnosis not present

## 2019-03-17 DIAGNOSIS — D631 Anemia in chronic kidney disease: Secondary | ICD-10-CM | POA: Diagnosis not present

## 2019-03-17 DIAGNOSIS — Z9181 History of falling: Secondary | ICD-10-CM | POA: Diagnosis not present

## 2019-03-17 DIAGNOSIS — S8261XD Displaced fracture of lateral malleolus of right fibula, subsequent encounter for closed fracture with routine healing: Secondary | ICD-10-CM | POA: Diagnosis not present

## 2019-03-17 DIAGNOSIS — N186 End stage renal disease: Secondary | ICD-10-CM | POA: Diagnosis not present

## 2019-03-17 DIAGNOSIS — Z992 Dependence on renal dialysis: Secondary | ICD-10-CM | POA: Diagnosis not present

## 2019-03-17 DIAGNOSIS — Z7982 Long term (current) use of aspirin: Secondary | ICD-10-CM | POA: Diagnosis not present

## 2019-03-17 DIAGNOSIS — E1122 Type 2 diabetes mellitus with diabetic chronic kidney disease: Secondary | ICD-10-CM | POA: Diagnosis not present

## 2019-03-17 DIAGNOSIS — S82141D Displaced bicondylar fracture of right tibia, subsequent encounter for closed fracture with routine healing: Secondary | ICD-10-CM | POA: Diagnosis not present

## 2019-03-17 NOTE — Telephone Encounter (Signed)
Transition of care from inpatient facility  Date of discharge: 03/13/19 Date of contact: 03/17/19 Method of contact: Phone  Attempted to contact patient to discuss transition of care from inpatient facility. Patient did not answer the phone. LM with call back instructions. Will continue to attempt to contact patient at outpatient HD unit.  Anice Paganini, PA-C 03/17/2019, 1:18 PM  [facility] Pager: [phone]

## 2019-03-18 DIAGNOSIS — S82141D Displaced bicondylar fracture of right tibia, subsequent encounter for closed fracture with routine healing: Secondary | ICD-10-CM | POA: Diagnosis not present

## 2019-03-18 DIAGNOSIS — Z992 Dependence on renal dialysis: Secondary | ICD-10-CM | POA: Diagnosis not present

## 2019-03-18 DIAGNOSIS — N2581 Secondary hyperparathyroidism of renal origin: Secondary | ICD-10-CM | POA: Diagnosis not present

## 2019-03-18 DIAGNOSIS — S82831D Other fracture of upper and lower end of right fibula, subsequent encounter for closed fracture with routine healing: Secondary | ICD-10-CM | POA: Diagnosis not present

## 2019-03-18 DIAGNOSIS — S82143A Displaced bicondylar fracture of unspecified tibia, initial encounter for closed fracture: Secondary | ICD-10-CM | POA: Diagnosis not present

## 2019-03-18 DIAGNOSIS — N186 End stage renal disease: Secondary | ICD-10-CM | POA: Diagnosis not present

## 2019-03-20 DIAGNOSIS — N186 End stage renal disease: Secondary | ICD-10-CM | POA: Diagnosis not present

## 2019-03-20 DIAGNOSIS — Z992 Dependence on renal dialysis: Secondary | ICD-10-CM | POA: Diagnosis not present

## 2019-03-20 DIAGNOSIS — N2581 Secondary hyperparathyroidism of renal origin: Secondary | ICD-10-CM | POA: Diagnosis not present

## 2019-03-23 DIAGNOSIS — N2581 Secondary hyperparathyroidism of renal origin: Secondary | ICD-10-CM | POA: Diagnosis not present

## 2019-03-23 DIAGNOSIS — S8261XD Displaced fracture of lateral malleolus of right fibula, subsequent encounter for closed fracture with routine healing: Secondary | ICD-10-CM | POA: Diagnosis not present

## 2019-03-23 DIAGNOSIS — E1122 Type 2 diabetes mellitus with diabetic chronic kidney disease: Secondary | ICD-10-CM | POA: Diagnosis not present

## 2019-03-23 DIAGNOSIS — I120 Hypertensive chronic kidney disease with stage 5 chronic kidney disease or end stage renal disease: Secondary | ICD-10-CM | POA: Diagnosis not present

## 2019-03-23 DIAGNOSIS — Z7982 Long term (current) use of aspirin: Secondary | ICD-10-CM | POA: Diagnosis not present

## 2019-03-23 DIAGNOSIS — N186 End stage renal disease: Secondary | ICD-10-CM | POA: Diagnosis not present

## 2019-03-23 DIAGNOSIS — S82141D Displaced bicondylar fracture of right tibia, subsequent encounter for closed fracture with routine healing: Secondary | ICD-10-CM | POA: Diagnosis not present

## 2019-03-23 DIAGNOSIS — Z9181 History of falling: Secondary | ICD-10-CM | POA: Diagnosis not present

## 2019-03-23 DIAGNOSIS — Z992 Dependence on renal dialysis: Secondary | ICD-10-CM | POA: Diagnosis not present

## 2019-03-23 DIAGNOSIS — Z794 Long term (current) use of insulin: Secondary | ICD-10-CM | POA: Diagnosis not present

## 2019-03-23 DIAGNOSIS — D631 Anemia in chronic kidney disease: Secondary | ICD-10-CM | POA: Diagnosis not present

## 2019-03-25 DIAGNOSIS — N186 End stage renal disease: Secondary | ICD-10-CM | POA: Diagnosis not present

## 2019-03-25 DIAGNOSIS — Z992 Dependence on renal dialysis: Secondary | ICD-10-CM | POA: Diagnosis not present

## 2019-03-25 DIAGNOSIS — N2581 Secondary hyperparathyroidism of renal origin: Secondary | ICD-10-CM | POA: Diagnosis not present

## 2019-03-27 DIAGNOSIS — Z992 Dependence on renal dialysis: Secondary | ICD-10-CM | POA: Diagnosis not present

## 2019-03-27 DIAGNOSIS — N2581 Secondary hyperparathyroidism of renal origin: Secondary | ICD-10-CM | POA: Diagnosis not present

## 2019-03-27 DIAGNOSIS — N186 End stage renal disease: Secondary | ICD-10-CM | POA: Diagnosis not present

## 2019-03-30 DIAGNOSIS — N2581 Secondary hyperparathyroidism of renal origin: Secondary | ICD-10-CM | POA: Diagnosis not present

## 2019-03-30 DIAGNOSIS — N186 End stage renal disease: Secondary | ICD-10-CM | POA: Diagnosis not present

## 2019-03-30 DIAGNOSIS — Z992 Dependence on renal dialysis: Secondary | ICD-10-CM | POA: Diagnosis not present

## 2019-03-31 DIAGNOSIS — S8261XD Displaced fracture of lateral malleolus of right fibula, subsequent encounter for closed fracture with routine healing: Secondary | ICD-10-CM | POA: Diagnosis not present

## 2019-03-31 DIAGNOSIS — Z7982 Long term (current) use of aspirin: Secondary | ICD-10-CM | POA: Diagnosis not present

## 2019-03-31 DIAGNOSIS — E1122 Type 2 diabetes mellitus with diabetic chronic kidney disease: Secondary | ICD-10-CM | POA: Diagnosis not present

## 2019-03-31 DIAGNOSIS — Z992 Dependence on renal dialysis: Secondary | ICD-10-CM | POA: Diagnosis not present

## 2019-03-31 DIAGNOSIS — I120 Hypertensive chronic kidney disease with stage 5 chronic kidney disease or end stage renal disease: Secondary | ICD-10-CM | POA: Diagnosis not present

## 2019-03-31 DIAGNOSIS — N186 End stage renal disease: Secondary | ICD-10-CM | POA: Diagnosis not present

## 2019-03-31 DIAGNOSIS — S82141D Displaced bicondylar fracture of right tibia, subsequent encounter for closed fracture with routine healing: Secondary | ICD-10-CM | POA: Diagnosis not present

## 2019-03-31 DIAGNOSIS — Z9181 History of falling: Secondary | ICD-10-CM | POA: Diagnosis not present

## 2019-03-31 DIAGNOSIS — D631 Anemia in chronic kidney disease: Secondary | ICD-10-CM | POA: Diagnosis not present

## 2019-03-31 DIAGNOSIS — Z794 Long term (current) use of insulin: Secondary | ICD-10-CM | POA: Diagnosis not present

## 2019-04-01 DIAGNOSIS — N186 End stage renal disease: Secondary | ICD-10-CM | POA: Diagnosis not present

## 2019-04-01 DIAGNOSIS — Z992 Dependence on renal dialysis: Secondary | ICD-10-CM | POA: Diagnosis not present

## 2019-04-01 DIAGNOSIS — N2581 Secondary hyperparathyroidism of renal origin: Secondary | ICD-10-CM | POA: Diagnosis not present

## 2019-04-03 DIAGNOSIS — N186 End stage renal disease: Secondary | ICD-10-CM | POA: Diagnosis not present

## 2019-04-03 DIAGNOSIS — N2581 Secondary hyperparathyroidism of renal origin: Secondary | ICD-10-CM | POA: Diagnosis not present

## 2019-04-03 DIAGNOSIS — Z992 Dependence on renal dialysis: Secondary | ICD-10-CM | POA: Diagnosis not present

## 2019-04-06 DIAGNOSIS — Z992 Dependence on renal dialysis: Secondary | ICD-10-CM | POA: Diagnosis not present

## 2019-04-06 DIAGNOSIS — N186 End stage renal disease: Secondary | ICD-10-CM | POA: Diagnosis not present

## 2019-04-06 DIAGNOSIS — N2581 Secondary hyperparathyroidism of renal origin: Secondary | ICD-10-CM | POA: Diagnosis not present

## 2019-04-07 DIAGNOSIS — I120 Hypertensive chronic kidney disease with stage 5 chronic kidney disease or end stage renal disease: Secondary | ICD-10-CM | POA: Diagnosis not present

## 2019-04-07 DIAGNOSIS — S8261XD Displaced fracture of lateral malleolus of right fibula, subsequent encounter for closed fracture with routine healing: Secondary | ICD-10-CM | POA: Diagnosis not present

## 2019-04-07 DIAGNOSIS — Z7982 Long term (current) use of aspirin: Secondary | ICD-10-CM | POA: Diagnosis not present

## 2019-04-07 DIAGNOSIS — E1122 Type 2 diabetes mellitus with diabetic chronic kidney disease: Secondary | ICD-10-CM | POA: Diagnosis not present

## 2019-04-07 DIAGNOSIS — S82141D Displaced bicondylar fracture of right tibia, subsequent encounter for closed fracture with routine healing: Secondary | ICD-10-CM | POA: Diagnosis not present

## 2019-04-07 DIAGNOSIS — D631 Anemia in chronic kidney disease: Secondary | ICD-10-CM | POA: Diagnosis not present

## 2019-04-07 DIAGNOSIS — Z992 Dependence on renal dialysis: Secondary | ICD-10-CM | POA: Diagnosis not present

## 2019-04-07 DIAGNOSIS — N186 End stage renal disease: Secondary | ICD-10-CM | POA: Diagnosis not present

## 2019-04-07 DIAGNOSIS — Z794 Long term (current) use of insulin: Secondary | ICD-10-CM | POA: Diagnosis not present

## 2019-04-07 DIAGNOSIS — Z9181 History of falling: Secondary | ICD-10-CM | POA: Diagnosis not present

## 2019-04-08 DIAGNOSIS — Z992 Dependence on renal dialysis: Secondary | ICD-10-CM | POA: Diagnosis not present

## 2019-04-08 DIAGNOSIS — N2581 Secondary hyperparathyroidism of renal origin: Secondary | ICD-10-CM | POA: Diagnosis not present

## 2019-04-08 DIAGNOSIS — N186 End stage renal disease: Secondary | ICD-10-CM | POA: Diagnosis not present

## 2019-04-10 DIAGNOSIS — S82143A Displaced bicondylar fracture of unspecified tibia, initial encounter for closed fracture: Secondary | ICD-10-CM | POA: Diagnosis not present

## 2019-04-10 DIAGNOSIS — Z992 Dependence on renal dialysis: Secondary | ICD-10-CM | POA: Diagnosis not present

## 2019-04-10 DIAGNOSIS — N2581 Secondary hyperparathyroidism of renal origin: Secondary | ICD-10-CM | POA: Diagnosis not present

## 2019-04-10 DIAGNOSIS — N186 End stage renal disease: Secondary | ICD-10-CM | POA: Diagnosis not present

## 2019-04-13 DIAGNOSIS — N2581 Secondary hyperparathyroidism of renal origin: Secondary | ICD-10-CM | POA: Diagnosis not present

## 2019-04-13 DIAGNOSIS — Z992 Dependence on renal dialysis: Secondary | ICD-10-CM | POA: Diagnosis not present

## 2019-04-13 DIAGNOSIS — N186 End stage renal disease: Secondary | ICD-10-CM | POA: Diagnosis not present

## 2019-04-14 DIAGNOSIS — E1122 Type 2 diabetes mellitus with diabetic chronic kidney disease: Secondary | ICD-10-CM | POA: Diagnosis not present

## 2019-04-14 DIAGNOSIS — N186 End stage renal disease: Secondary | ICD-10-CM | POA: Diagnosis not present

## 2019-04-14 DIAGNOSIS — Z992 Dependence on renal dialysis: Secondary | ICD-10-CM | POA: Diagnosis not present

## 2019-04-14 DIAGNOSIS — S82141D Displaced bicondylar fracture of right tibia, subsequent encounter for closed fracture with routine healing: Secondary | ICD-10-CM | POA: Diagnosis not present

## 2019-04-14 DIAGNOSIS — Z9181 History of falling: Secondary | ICD-10-CM | POA: Diagnosis not present

## 2019-04-14 DIAGNOSIS — I120 Hypertensive chronic kidney disease with stage 5 chronic kidney disease or end stage renal disease: Secondary | ICD-10-CM | POA: Diagnosis not present

## 2019-04-14 DIAGNOSIS — D631 Anemia in chronic kidney disease: Secondary | ICD-10-CM | POA: Diagnosis not present

## 2019-04-14 DIAGNOSIS — S8261XD Displaced fracture of lateral malleolus of right fibula, subsequent encounter for closed fracture with routine healing: Secondary | ICD-10-CM | POA: Diagnosis not present

## 2019-04-14 DIAGNOSIS — Z794 Long term (current) use of insulin: Secondary | ICD-10-CM | POA: Diagnosis not present

## 2019-04-14 DIAGNOSIS — Z7982 Long term (current) use of aspirin: Secondary | ICD-10-CM | POA: Diagnosis not present

## 2019-04-15 DIAGNOSIS — N2581 Secondary hyperparathyroidism of renal origin: Secondary | ICD-10-CM | POA: Diagnosis not present

## 2019-04-15 DIAGNOSIS — S82141D Displaced bicondylar fracture of right tibia, subsequent encounter for closed fracture with routine healing: Secondary | ICD-10-CM | POA: Diagnosis not present

## 2019-04-15 DIAGNOSIS — Z992 Dependence on renal dialysis: Secondary | ICD-10-CM | POA: Diagnosis not present

## 2019-04-15 DIAGNOSIS — N186 End stage renal disease: Secondary | ICD-10-CM | POA: Diagnosis not present

## 2019-04-15 DIAGNOSIS — E1122 Type 2 diabetes mellitus with diabetic chronic kidney disease: Secondary | ICD-10-CM | POA: Diagnosis not present

## 2019-04-15 DIAGNOSIS — S82831D Other fracture of upper and lower end of right fibula, subsequent encounter for closed fracture with routine healing: Secondary | ICD-10-CM | POA: Diagnosis not present

## 2019-04-17 DIAGNOSIS — Z992 Dependence on renal dialysis: Secondary | ICD-10-CM | POA: Diagnosis not present

## 2019-04-17 DIAGNOSIS — N186 End stage renal disease: Secondary | ICD-10-CM | POA: Diagnosis not present

## 2019-04-17 DIAGNOSIS — N2581 Secondary hyperparathyroidism of renal origin: Secondary | ICD-10-CM | POA: Diagnosis not present

## 2019-04-20 DIAGNOSIS — N186 End stage renal disease: Secondary | ICD-10-CM | POA: Diagnosis not present

## 2019-04-20 DIAGNOSIS — N2581 Secondary hyperparathyroidism of renal origin: Secondary | ICD-10-CM | POA: Diagnosis not present

## 2019-04-20 DIAGNOSIS — Z992 Dependence on renal dialysis: Secondary | ICD-10-CM | POA: Diagnosis not present

## 2019-04-22 DIAGNOSIS — Z992 Dependence on renal dialysis: Secondary | ICD-10-CM | POA: Diagnosis not present

## 2019-04-22 DIAGNOSIS — N2581 Secondary hyperparathyroidism of renal origin: Secondary | ICD-10-CM | POA: Diagnosis not present

## 2019-04-22 DIAGNOSIS — N186 End stage renal disease: Secondary | ICD-10-CM | POA: Diagnosis not present

## 2019-04-24 DIAGNOSIS — N2581 Secondary hyperparathyroidism of renal origin: Secondary | ICD-10-CM | POA: Diagnosis not present

## 2019-04-24 DIAGNOSIS — N186 End stage renal disease: Secondary | ICD-10-CM | POA: Diagnosis not present

## 2019-04-24 DIAGNOSIS — Z992 Dependence on renal dialysis: Secondary | ICD-10-CM | POA: Diagnosis not present

## 2019-04-27 DIAGNOSIS — N186 End stage renal disease: Secondary | ICD-10-CM | POA: Diagnosis not present

## 2019-04-27 DIAGNOSIS — Z992 Dependence on renal dialysis: Secondary | ICD-10-CM | POA: Diagnosis not present

## 2019-04-27 DIAGNOSIS — N2581 Secondary hyperparathyroidism of renal origin: Secondary | ICD-10-CM | POA: Diagnosis not present

## 2019-04-29 DIAGNOSIS — N186 End stage renal disease: Secondary | ICD-10-CM | POA: Diagnosis not present

## 2019-04-29 DIAGNOSIS — Z992 Dependence on renal dialysis: Secondary | ICD-10-CM | POA: Diagnosis not present

## 2019-04-29 DIAGNOSIS — N2581 Secondary hyperparathyroidism of renal origin: Secondary | ICD-10-CM | POA: Diagnosis not present

## 2019-05-01 DIAGNOSIS — N2581 Secondary hyperparathyroidism of renal origin: Secondary | ICD-10-CM | POA: Diagnosis not present

## 2019-05-01 DIAGNOSIS — Z992 Dependence on renal dialysis: Secondary | ICD-10-CM | POA: Diagnosis not present

## 2019-05-01 DIAGNOSIS — N186 End stage renal disease: Secondary | ICD-10-CM | POA: Diagnosis not present

## 2019-05-04 DIAGNOSIS — N186 End stage renal disease: Secondary | ICD-10-CM | POA: Diagnosis not present

## 2019-05-04 DIAGNOSIS — Z992 Dependence on renal dialysis: Secondary | ICD-10-CM | POA: Diagnosis not present

## 2019-05-04 DIAGNOSIS — N2581 Secondary hyperparathyroidism of renal origin: Secondary | ICD-10-CM | POA: Diagnosis not present

## 2019-05-05 DIAGNOSIS — E11319 Type 2 diabetes mellitus with unspecified diabetic retinopathy without macular edema: Secondary | ICD-10-CM | POA: Diagnosis not present

## 2019-05-05 DIAGNOSIS — N2581 Secondary hyperparathyroidism of renal origin: Secondary | ICD-10-CM | POA: Diagnosis not present

## 2019-05-05 DIAGNOSIS — Z79899 Other long term (current) drug therapy: Secondary | ICD-10-CM | POA: Diagnosis not present

## 2019-05-05 DIAGNOSIS — Z125 Encounter for screening for malignant neoplasm of prostate: Secondary | ICD-10-CM | POA: Diagnosis not present

## 2019-05-05 DIAGNOSIS — Z0000 Encounter for general adult medical examination without abnormal findings: Secondary | ICD-10-CM | POA: Diagnosis not present

## 2019-05-06 DIAGNOSIS — N2581 Secondary hyperparathyroidism of renal origin: Secondary | ICD-10-CM | POA: Diagnosis not present

## 2019-05-06 DIAGNOSIS — Z992 Dependence on renal dialysis: Secondary | ICD-10-CM | POA: Diagnosis not present

## 2019-05-06 DIAGNOSIS — N186 End stage renal disease: Secondary | ICD-10-CM | POA: Diagnosis not present

## 2019-05-07 DIAGNOSIS — Z0000 Encounter for general adult medical examination without abnormal findings: Secondary | ICD-10-CM | POA: Diagnosis not present

## 2019-05-07 DIAGNOSIS — E11319 Type 2 diabetes mellitus with unspecified diabetic retinopathy without macular edema: Secondary | ICD-10-CM | POA: Diagnosis not present

## 2019-05-07 DIAGNOSIS — E1140 Type 2 diabetes mellitus with diabetic neuropathy, unspecified: Secondary | ICD-10-CM | POA: Diagnosis not present

## 2019-05-07 DIAGNOSIS — E669 Obesity, unspecified: Secondary | ICD-10-CM | POA: Diagnosis not present

## 2019-05-07 DIAGNOSIS — L409 Psoriasis, unspecified: Secondary | ICD-10-CM | POA: Diagnosis not present

## 2019-05-08 DIAGNOSIS — N2581 Secondary hyperparathyroidism of renal origin: Secondary | ICD-10-CM | POA: Diagnosis not present

## 2019-05-08 DIAGNOSIS — N186 End stage renal disease: Secondary | ICD-10-CM | POA: Diagnosis not present

## 2019-05-08 DIAGNOSIS — Z992 Dependence on renal dialysis: Secondary | ICD-10-CM | POA: Diagnosis not present

## 2019-05-11 DIAGNOSIS — Z992 Dependence on renal dialysis: Secondary | ICD-10-CM | POA: Diagnosis not present

## 2019-05-11 DIAGNOSIS — N186 End stage renal disease: Secondary | ICD-10-CM | POA: Diagnosis not present

## 2019-05-11 DIAGNOSIS — N2581 Secondary hyperparathyroidism of renal origin: Secondary | ICD-10-CM | POA: Diagnosis not present

## 2019-05-11 DIAGNOSIS — S82143A Displaced bicondylar fracture of unspecified tibia, initial encounter for closed fracture: Secondary | ICD-10-CM | POA: Diagnosis not present

## 2019-05-13 DIAGNOSIS — N186 End stage renal disease: Secondary | ICD-10-CM | POA: Diagnosis not present

## 2019-05-13 DIAGNOSIS — Z992 Dependence on renal dialysis: Secondary | ICD-10-CM | POA: Diagnosis not present

## 2019-05-13 DIAGNOSIS — S82141D Displaced bicondylar fracture of right tibia, subsequent encounter for closed fracture with routine healing: Secondary | ICD-10-CM | POA: Diagnosis not present

## 2019-05-13 DIAGNOSIS — N2581 Secondary hyperparathyroidism of renal origin: Secondary | ICD-10-CM | POA: Diagnosis not present

## 2019-05-13 DIAGNOSIS — S82831D Other fracture of upper and lower end of right fibula, subsequent encounter for closed fracture with routine healing: Secondary | ICD-10-CM | POA: Diagnosis not present

## 2019-05-15 DIAGNOSIS — Z452 Encounter for adjustment and management of vascular access device: Secondary | ICD-10-CM | POA: Diagnosis not present

## 2019-05-15 DIAGNOSIS — Z992 Dependence on renal dialysis: Secondary | ICD-10-CM | POA: Diagnosis not present

## 2019-05-15 DIAGNOSIS — N2581 Secondary hyperparathyroidism of renal origin: Secondary | ICD-10-CM | POA: Diagnosis not present

## 2019-05-15 DIAGNOSIS — N186 End stage renal disease: Secondary | ICD-10-CM | POA: Diagnosis not present

## 2019-05-15 DIAGNOSIS — E1122 Type 2 diabetes mellitus with diabetic chronic kidney disease: Secondary | ICD-10-CM | POA: Diagnosis not present

## 2019-05-18 DIAGNOSIS — Z992 Dependence on renal dialysis: Secondary | ICD-10-CM | POA: Diagnosis not present

## 2019-05-18 DIAGNOSIS — N2581 Secondary hyperparathyroidism of renal origin: Secondary | ICD-10-CM | POA: Diagnosis not present

## 2019-05-18 DIAGNOSIS — N186 End stage renal disease: Secondary | ICD-10-CM | POA: Diagnosis not present

## 2019-05-20 DIAGNOSIS — N186 End stage renal disease: Secondary | ICD-10-CM | POA: Diagnosis not present

## 2019-05-20 DIAGNOSIS — Z992 Dependence on renal dialysis: Secondary | ICD-10-CM | POA: Diagnosis not present

## 2019-05-20 DIAGNOSIS — N2581 Secondary hyperparathyroidism of renal origin: Secondary | ICD-10-CM | POA: Diagnosis not present

## 2019-05-22 DIAGNOSIS — N2581 Secondary hyperparathyroidism of renal origin: Secondary | ICD-10-CM | POA: Diagnosis not present

## 2019-05-22 DIAGNOSIS — Z992 Dependence on renal dialysis: Secondary | ICD-10-CM | POA: Diagnosis not present

## 2019-05-22 DIAGNOSIS — N186 End stage renal disease: Secondary | ICD-10-CM | POA: Diagnosis not present

## 2019-05-25 DIAGNOSIS — N186 End stage renal disease: Secondary | ICD-10-CM | POA: Diagnosis not present

## 2019-05-25 DIAGNOSIS — N2581 Secondary hyperparathyroidism of renal origin: Secondary | ICD-10-CM | POA: Diagnosis not present

## 2019-05-25 DIAGNOSIS — Z992 Dependence on renal dialysis: Secondary | ICD-10-CM | POA: Diagnosis not present

## 2019-05-27 DIAGNOSIS — N2581 Secondary hyperparathyroidism of renal origin: Secondary | ICD-10-CM | POA: Diagnosis not present

## 2019-05-27 DIAGNOSIS — Z992 Dependence on renal dialysis: Secondary | ICD-10-CM | POA: Diagnosis not present

## 2019-05-27 DIAGNOSIS — N186 End stage renal disease: Secondary | ICD-10-CM | POA: Diagnosis not present

## 2019-05-29 DIAGNOSIS — N2581 Secondary hyperparathyroidism of renal origin: Secondary | ICD-10-CM | POA: Diagnosis not present

## 2019-05-29 DIAGNOSIS — N186 End stage renal disease: Secondary | ICD-10-CM | POA: Diagnosis not present

## 2019-05-29 DIAGNOSIS — Z992 Dependence on renal dialysis: Secondary | ICD-10-CM | POA: Diagnosis not present

## 2019-06-01 DIAGNOSIS — N186 End stage renal disease: Secondary | ICD-10-CM | POA: Diagnosis not present

## 2019-06-01 DIAGNOSIS — N2581 Secondary hyperparathyroidism of renal origin: Secondary | ICD-10-CM | POA: Diagnosis not present

## 2019-06-01 DIAGNOSIS — Z992 Dependence on renal dialysis: Secondary | ICD-10-CM | POA: Diagnosis not present

## 2019-06-03 DIAGNOSIS — N186 End stage renal disease: Secondary | ICD-10-CM | POA: Diagnosis not present

## 2019-06-03 DIAGNOSIS — N2581 Secondary hyperparathyroidism of renal origin: Secondary | ICD-10-CM | POA: Diagnosis not present

## 2019-06-03 DIAGNOSIS — Z992 Dependence on renal dialysis: Secondary | ICD-10-CM | POA: Diagnosis not present

## 2019-06-05 DIAGNOSIS — N186 End stage renal disease: Secondary | ICD-10-CM | POA: Diagnosis not present

## 2019-06-05 DIAGNOSIS — N2581 Secondary hyperparathyroidism of renal origin: Secondary | ICD-10-CM | POA: Diagnosis not present

## 2019-06-05 DIAGNOSIS — Z992 Dependence on renal dialysis: Secondary | ICD-10-CM | POA: Diagnosis not present

## 2019-06-08 DIAGNOSIS — N2581 Secondary hyperparathyroidism of renal origin: Secondary | ICD-10-CM | POA: Diagnosis not present

## 2019-06-08 DIAGNOSIS — Z992 Dependence on renal dialysis: Secondary | ICD-10-CM | POA: Diagnosis not present

## 2019-06-08 DIAGNOSIS — N186 End stage renal disease: Secondary | ICD-10-CM | POA: Diagnosis not present

## 2019-06-10 DIAGNOSIS — Z992 Dependence on renal dialysis: Secondary | ICD-10-CM | POA: Diagnosis not present

## 2019-06-10 DIAGNOSIS — N2581 Secondary hyperparathyroidism of renal origin: Secondary | ICD-10-CM | POA: Diagnosis not present

## 2019-06-10 DIAGNOSIS — S82143A Displaced bicondylar fracture of unspecified tibia, initial encounter for closed fracture: Secondary | ICD-10-CM | POA: Diagnosis not present

## 2019-06-10 DIAGNOSIS — N186 End stage renal disease: Secondary | ICD-10-CM | POA: Diagnosis not present

## 2019-06-12 DIAGNOSIS — N2581 Secondary hyperparathyroidism of renal origin: Secondary | ICD-10-CM | POA: Diagnosis not present

## 2019-06-12 DIAGNOSIS — N186 End stage renal disease: Secondary | ICD-10-CM | POA: Diagnosis not present

## 2019-06-12 DIAGNOSIS — Z992 Dependence on renal dialysis: Secondary | ICD-10-CM | POA: Diagnosis not present

## 2019-06-15 DIAGNOSIS — N2581 Secondary hyperparathyroidism of renal origin: Secondary | ICD-10-CM | POA: Diagnosis not present

## 2019-06-15 DIAGNOSIS — N186 End stage renal disease: Secondary | ICD-10-CM | POA: Diagnosis not present

## 2019-06-15 DIAGNOSIS — E1122 Type 2 diabetes mellitus with diabetic chronic kidney disease: Secondary | ICD-10-CM | POA: Diagnosis not present

## 2019-06-15 DIAGNOSIS — Z992 Dependence on renal dialysis: Secondary | ICD-10-CM | POA: Diagnosis not present

## 2019-06-17 DIAGNOSIS — Z992 Dependence on renal dialysis: Secondary | ICD-10-CM | POA: Diagnosis not present

## 2019-06-17 DIAGNOSIS — N2581 Secondary hyperparathyroidism of renal origin: Secondary | ICD-10-CM | POA: Diagnosis not present

## 2019-06-17 DIAGNOSIS — N186 End stage renal disease: Secondary | ICD-10-CM | POA: Diagnosis not present

## 2019-06-19 DIAGNOSIS — N186 End stage renal disease: Secondary | ICD-10-CM | POA: Diagnosis not present

## 2019-06-19 DIAGNOSIS — N2581 Secondary hyperparathyroidism of renal origin: Secondary | ICD-10-CM | POA: Diagnosis not present

## 2019-06-19 DIAGNOSIS — Z992 Dependence on renal dialysis: Secondary | ICD-10-CM | POA: Diagnosis not present

## 2019-06-22 ENCOUNTER — Encounter: Payer: Self-pay | Admitting: Gastroenterology

## 2019-06-22 DIAGNOSIS — N186 End stage renal disease: Secondary | ICD-10-CM | POA: Diagnosis not present

## 2019-06-22 DIAGNOSIS — N2581 Secondary hyperparathyroidism of renal origin: Secondary | ICD-10-CM | POA: Diagnosis not present

## 2019-06-22 DIAGNOSIS — Z992 Dependence on renal dialysis: Secondary | ICD-10-CM | POA: Diagnosis not present

## 2019-06-24 DIAGNOSIS — N2581 Secondary hyperparathyroidism of renal origin: Secondary | ICD-10-CM | POA: Diagnosis not present

## 2019-06-24 DIAGNOSIS — N186 End stage renal disease: Secondary | ICD-10-CM | POA: Diagnosis not present

## 2019-06-24 DIAGNOSIS — Z992 Dependence on renal dialysis: Secondary | ICD-10-CM | POA: Diagnosis not present

## 2019-06-27 DIAGNOSIS — Z992 Dependence on renal dialysis: Secondary | ICD-10-CM | POA: Diagnosis not present

## 2019-06-27 DIAGNOSIS — N186 End stage renal disease: Secondary | ICD-10-CM | POA: Diagnosis not present

## 2019-06-27 DIAGNOSIS — N2581 Secondary hyperparathyroidism of renal origin: Secondary | ICD-10-CM | POA: Diagnosis not present

## 2019-06-29 DIAGNOSIS — N186 End stage renal disease: Secondary | ICD-10-CM | POA: Diagnosis not present

## 2019-06-29 DIAGNOSIS — Z992 Dependence on renal dialysis: Secondary | ICD-10-CM | POA: Diagnosis not present

## 2019-06-29 DIAGNOSIS — N2581 Secondary hyperparathyroidism of renal origin: Secondary | ICD-10-CM | POA: Diagnosis not present

## 2019-07-01 DIAGNOSIS — N186 End stage renal disease: Secondary | ICD-10-CM | POA: Diagnosis not present

## 2019-07-01 DIAGNOSIS — N2581 Secondary hyperparathyroidism of renal origin: Secondary | ICD-10-CM | POA: Diagnosis not present

## 2019-07-01 DIAGNOSIS — Z992 Dependence on renal dialysis: Secondary | ICD-10-CM | POA: Diagnosis not present

## 2019-07-04 DIAGNOSIS — Z992 Dependence on renal dialysis: Secondary | ICD-10-CM | POA: Diagnosis not present

## 2019-07-04 DIAGNOSIS — N186 End stage renal disease: Secondary | ICD-10-CM | POA: Diagnosis not present

## 2019-07-04 DIAGNOSIS — N2581 Secondary hyperparathyroidism of renal origin: Secondary | ICD-10-CM | POA: Diagnosis not present

## 2019-07-06 DIAGNOSIS — Z992 Dependence on renal dialysis: Secondary | ICD-10-CM | POA: Diagnosis not present

## 2019-07-06 DIAGNOSIS — N186 End stage renal disease: Secondary | ICD-10-CM | POA: Diagnosis not present

## 2019-07-06 DIAGNOSIS — N2581 Secondary hyperparathyroidism of renal origin: Secondary | ICD-10-CM | POA: Diagnosis not present

## 2019-07-08 DIAGNOSIS — Z992 Dependence on renal dialysis: Secondary | ICD-10-CM | POA: Diagnosis not present

## 2019-07-08 DIAGNOSIS — N2581 Secondary hyperparathyroidism of renal origin: Secondary | ICD-10-CM | POA: Diagnosis not present

## 2019-07-08 DIAGNOSIS — N186 End stage renal disease: Secondary | ICD-10-CM | POA: Diagnosis not present

## 2019-07-10 DIAGNOSIS — N2581 Secondary hyperparathyroidism of renal origin: Secondary | ICD-10-CM | POA: Diagnosis not present

## 2019-07-10 DIAGNOSIS — N186 End stage renal disease: Secondary | ICD-10-CM | POA: Diagnosis not present

## 2019-07-10 DIAGNOSIS — Z992 Dependence on renal dialysis: Secondary | ICD-10-CM | POA: Diagnosis not present

## 2019-07-11 DIAGNOSIS — S82143A Displaced bicondylar fracture of unspecified tibia, initial encounter for closed fracture: Secondary | ICD-10-CM | POA: Diagnosis not present

## 2019-07-13 DIAGNOSIS — N2581 Secondary hyperparathyroidism of renal origin: Secondary | ICD-10-CM | POA: Diagnosis not present

## 2019-07-13 DIAGNOSIS — Z992 Dependence on renal dialysis: Secondary | ICD-10-CM | POA: Diagnosis not present

## 2019-07-13 DIAGNOSIS — N186 End stage renal disease: Secondary | ICD-10-CM | POA: Diagnosis not present

## 2019-07-15 DIAGNOSIS — Z992 Dependence on renal dialysis: Secondary | ICD-10-CM | POA: Diagnosis not present

## 2019-07-15 DIAGNOSIS — N186 End stage renal disease: Secondary | ICD-10-CM | POA: Diagnosis not present

## 2019-07-15 DIAGNOSIS — N2581 Secondary hyperparathyroidism of renal origin: Secondary | ICD-10-CM | POA: Diagnosis not present

## 2019-07-15 DIAGNOSIS — E1122 Type 2 diabetes mellitus with diabetic chronic kidney disease: Secondary | ICD-10-CM | POA: Diagnosis not present

## 2019-07-17 DIAGNOSIS — N186 End stage renal disease: Secondary | ICD-10-CM | POA: Diagnosis not present

## 2019-07-17 DIAGNOSIS — N2581 Secondary hyperparathyroidism of renal origin: Secondary | ICD-10-CM | POA: Diagnosis not present

## 2019-07-17 DIAGNOSIS — Z992 Dependence on renal dialysis: Secondary | ICD-10-CM | POA: Diagnosis not present

## 2019-07-20 DIAGNOSIS — N2581 Secondary hyperparathyroidism of renal origin: Secondary | ICD-10-CM | POA: Diagnosis not present

## 2019-07-20 DIAGNOSIS — Z992 Dependence on renal dialysis: Secondary | ICD-10-CM | POA: Diagnosis not present

## 2019-07-20 DIAGNOSIS — N186 End stage renal disease: Secondary | ICD-10-CM | POA: Diagnosis not present

## 2019-07-22 DIAGNOSIS — N186 End stage renal disease: Secondary | ICD-10-CM | POA: Diagnosis not present

## 2019-07-22 DIAGNOSIS — Z992 Dependence on renal dialysis: Secondary | ICD-10-CM | POA: Diagnosis not present

## 2019-07-22 DIAGNOSIS — N2581 Secondary hyperparathyroidism of renal origin: Secondary | ICD-10-CM | POA: Diagnosis not present

## 2019-07-24 DIAGNOSIS — Z992 Dependence on renal dialysis: Secondary | ICD-10-CM | POA: Diagnosis not present

## 2019-07-24 DIAGNOSIS — N2581 Secondary hyperparathyroidism of renal origin: Secondary | ICD-10-CM | POA: Diagnosis not present

## 2019-07-24 DIAGNOSIS — N186 End stage renal disease: Secondary | ICD-10-CM | POA: Diagnosis not present

## 2019-07-27 DIAGNOSIS — N186 End stage renal disease: Secondary | ICD-10-CM | POA: Diagnosis not present

## 2019-07-27 DIAGNOSIS — N2581 Secondary hyperparathyroidism of renal origin: Secondary | ICD-10-CM | POA: Diagnosis not present

## 2019-07-27 DIAGNOSIS — Z992 Dependence on renal dialysis: Secondary | ICD-10-CM | POA: Diagnosis not present

## 2019-07-29 DIAGNOSIS — N2581 Secondary hyperparathyroidism of renal origin: Secondary | ICD-10-CM | POA: Diagnosis not present

## 2019-07-29 DIAGNOSIS — N186 End stage renal disease: Secondary | ICD-10-CM | POA: Diagnosis not present

## 2019-07-29 DIAGNOSIS — Z992 Dependence on renal dialysis: Secondary | ICD-10-CM | POA: Diagnosis not present

## 2019-07-31 DIAGNOSIS — N186 End stage renal disease: Secondary | ICD-10-CM | POA: Diagnosis not present

## 2019-07-31 DIAGNOSIS — N2581 Secondary hyperparathyroidism of renal origin: Secondary | ICD-10-CM | POA: Diagnosis not present

## 2019-07-31 DIAGNOSIS — Z992 Dependence on renal dialysis: Secondary | ICD-10-CM | POA: Diagnosis not present

## 2019-08-03 DIAGNOSIS — N186 End stage renal disease: Secondary | ICD-10-CM | POA: Diagnosis not present

## 2019-08-03 DIAGNOSIS — N2581 Secondary hyperparathyroidism of renal origin: Secondary | ICD-10-CM | POA: Diagnosis not present

## 2019-08-03 DIAGNOSIS — Z992 Dependence on renal dialysis: Secondary | ICD-10-CM | POA: Diagnosis not present

## 2019-08-05 DIAGNOSIS — N186 End stage renal disease: Secondary | ICD-10-CM | POA: Diagnosis not present

## 2019-08-05 DIAGNOSIS — N2581 Secondary hyperparathyroidism of renal origin: Secondary | ICD-10-CM | POA: Diagnosis not present

## 2019-08-05 DIAGNOSIS — Z992 Dependence on renal dialysis: Secondary | ICD-10-CM | POA: Diagnosis not present

## 2019-08-07 ENCOUNTER — Encounter: Payer: Self-pay | Admitting: Gastroenterology

## 2019-08-07 ENCOUNTER — Ambulatory Visit: Payer: BC Managed Care – PPO | Admitting: Gastroenterology

## 2019-08-07 VITALS — BP 128/80 | HR 87 | Ht 71.0 in | Wt 207.4 lb

## 2019-08-07 DIAGNOSIS — R053 Chronic cough: Secondary | ICD-10-CM

## 2019-08-07 DIAGNOSIS — N2581 Secondary hyperparathyroidism of renal origin: Secondary | ICD-10-CM | POA: Diagnosis not present

## 2019-08-07 DIAGNOSIS — R05 Cough: Secondary | ICD-10-CM

## 2019-08-07 DIAGNOSIS — Z992 Dependence on renal dialysis: Secondary | ICD-10-CM | POA: Diagnosis not present

## 2019-08-07 DIAGNOSIS — R112 Nausea with vomiting, unspecified: Secondary | ICD-10-CM

## 2019-08-07 DIAGNOSIS — N186 End stage renal disease: Secondary | ICD-10-CM | POA: Diagnosis not present

## 2019-08-07 MED ORDER — PLENVU 140 G PO SOLR
140.0000 g | ORAL | 0 refills | Status: DC
Start: 2019-08-07 — End: 2019-11-18

## 2019-08-07 NOTE — Progress Notes (Signed)
[facility] Consult Note:  History: Alex [facility] 08/07/2019  Referring provider: Haywood Pao, MD  Reason for consult/chief complaint: Gastroesophageal Reflux (complains of cough and vomiting x 1 year), Nausea (with vomiting), Cough, and Diarrhea (2 - 3 times daily)   Subjective  HPI:  This is a very pleasant 49 year old man with end-stage renal disease on dialysis as well as insulin requiring diabetes referred by primary care for chronic cough and question of GERD as a contributing factor as well as nausea and vomiting. Alex Morrison was diagnosed with diabetes in 2013, and says he has been on insulin since initial diagnosis.  He developed end-stage renal disease from diabetes and hypertension, went on peritoneal dialysis about December 2019, but had a surgery for complications of that catheter August 2020, at which time he went on hemodialysis.  He currently has Monday Wednesday Friday hemodialysis. For least the last year and a half he has been having 2 problems and he cannot tell if they are related to each other.  He has a chronic cough along with sinus congestion and postnasal drip with mucus in the throat.  Sometimes he coughs so much it causes chest wall pain and may make him vomit.  He also gets intermittent nausea and sometimes will vomit or perhaps have severe regurgitation that occurs mostly at night or at least when he is laying flat.  Symptoms are definitely worse on the left side.  He was seen by Dr. Janace Hoard of ENT in January of this year, presumptive diagnosis of GERD made, note does not seem to indicate that any fiberoptic exam was done.  Patient was given a trial of twice daily PPI that he says was little or no help.  He has not had any further studies, though consideration of CT of the sinuses was suggested by ENT.    Alex Morrison is bothered by early satiety, sometimes variable appetite but thinks his weight is stable overall.  He denies pyrosis.  Bowel  habits are generally regular, but sometimes 2 or 3 BMs per day that are soft.  Denies rectal bleeding.  ROS:  Review of Systems  Constitutional: Negative for appetite change and unexpected weight change.  HENT: Negative for mouth sores and voice change.   Eyes: Negative for pain and redness.  Respiratory: Positive for cough. Negative for shortness of breath.   Cardiovascular: Negative for chest pain and palpitations.  Genitourinary: Negative for dysuria and hematuria.  Musculoskeletal: Negative for arthralgias and myalgias.  Skin: Negative for pallor and rash.  Neurological: Negative for weakness and headaches.  Hematological: Negative for adenopathy.     Past Medical History: Past Medical History:  Diagnosis Date  . Abscess of buttock   . Acute kidney injury ([place]) 09/27/2013  . Depression   . Diabetes mellitus without complication ([place])   . Diabetic neuropathy ([place])   . Dialysis patient [facility])    M,W,F  . ESRD (end stage renal disease) ([place])   . Glaucoma   . HTN (hypertension) 08/08/2018  . Obesity   . Perforated appendix    Peritonitis  . Psoriasis   . Retinopathy      Past Surgical History: Past Surgical History:  Procedure Laterality Date  . ABSCESS      RECTAL   . APPENDECTOMY    . AV FISTULA PLACEMENT Left 01/13/2019   Procedure: LEFT ARM ARTERIOVENOUS (AV) FISTULA CREATION;  Surgeon: Elam Dutch, MD;  Location: [place];  Service: Vascular;  Laterality: Left;  . CATARACT  EXTRACTION    . Ex lap with ileocecectomy  09/27/13   Dr. Brantley Stage  . ILEOCECETOMY    . INCISION AND DRAINAGE    . INCISIONAL HERNIA REPAIR N/A 08/11/2018   Procedure: OPEN RETRORECTUS REPAIR OF INCARCERATED INCISIONAL HERNIA WITH BILATERAL POSTERIOR COMPONENT SEPARATION;  Surgeon: [name], MD;  Location: [place];  Service: General;  Laterality: N/A;  . INSERTION OF MESH  08/11/2018   Procedure: INSERTION OF PHASIX MESH;  Surgeon: [name], MD;  Location: [place];  Service: General;;  .  insertion of PD catheter  12/2017  . IR FLUORO GUIDE CV LINE RIGHT  08/08/2018  . IR US GUIDE VASC ACCESS RIGHT  08/08/2018  . LAPAROSCOPIC ABDOMINAL EXPLORATION N/A 09/27/2013  . LAPAROSCOPIC ASSISTED VENTRAL HERNIA REPAIR  2019  . LAPAROSCOPY N/A 09/27/2013   Procedure: LAPAROSCOPY DIAGNOSTIC;  Surgeon: Erroll Luna, MD;  Location: [place];  Service: General;  Laterality: N/A;  . LAPAROTOMY  08/11/2018   Procedure: EXPLORATORY LAPAROTOMY;  Surgeon: [name], MD;  Location: [place];  Service: General;;  . LYSIS OF ADHESION  08/11/2018   Procedure: LYSIS OF ADHESIONS X 30 MINUTES;  Surgeon: [name], MD;  Location: [place];  Service: General;;  . MINOR REMOVAL OF PERITONEAL DIALYSIS CATHETER N/A 01/06/2019   Procedure: REMOVAL OF PERITONEAL DIALYSIS CATHETER;  Surgeon: Kieth Brightly, Arta Bruce, MD;  Location: [facility];  Service: General;  Laterality: N/A;  . OPEN REDUCTION INTERNAL FIXATION (ORIF) TIBIA/FIBULA FRACTURE Right 03/07/2019   Procedure: OPEN REDUCTION INTERNAL FIXATION (ORIF) FIBULA FRACTURE;  Surgeon: Marchia Bond, MD;  Location: [place];  Service: Orthopedics;  Laterality: Right;  . ORIF TIBIA PLATEAU Right 03/07/2019   Procedure: Open Reduction Internal Fixation (Orif) Tibial Plateau;  Surgeon: Marchia Bond, MD;  Location: [place];  Service: Orthopedics;  Laterality: Right;  . SHOULDER SURGERY  1989     Family History: Family History  Problem Relation Age of Onset  . Diabetes Mellitus II Mother   . Heart disease Mother   . Diabetes Mother   . Diabetes Mellitus I Father   . Diabetes Father   . Diabetes Mellitus II Brother   . Colon cancer Neg Hx   . Esophageal cancer Neg Hx   . Pancreatic cancer Neg Hx   . Stomach cancer Neg Hx     Social History: Social History   Socioeconomic History  . Marital status: Single    Spouse name: Not on file  . Number of children: Not on file  . Years of education: Not on file  . Highest education level: Not on file  Occupational  History  . Not on file  Tobacco Use  . Smoking status: Never Smoker  . Smokeless tobacco: Never Used  Vaping Use  . Vaping Use: Never used  Substance and Sexual Activity  . Alcohol use: No  . Drug use: No  . Sexual activity: Not on file  Other Topics Concern  . Not on file  Social History Narrative  . Not on file   Social Determinants of Health   Financial Resource Strain:   . Difficulty of Paying Living Expenses:   Food Insecurity:   . Worried About Charity fundraiser in the Last Year:   . Arboriculturist in the Last Year:   Transportation Needs:   . Film/video editor (Medical):   Marland Kitchen Lack of Transportation (Non-Medical):   Physical Activity:   . Days of Exercise per Week:   . Minutes of  Exercise per Session:   Stress:   . Feeling of Stress :   Social Connections:   . Frequency of Communication with Friends and Family:   . Frequency of Social Gatherings with Friends and Family:   . Attends Religious Services:   . Active Member of Clubs or Organizations:   . Attends Archivist Meetings:   Marland Kitchen Marital Status:     Allergies: No Known Allergies  Outpatient Meds: Current Outpatient Medications  Medication Sig Dispense Refill  . insulin aspart (NOVOLOG FLEXPEN) 100 UNIT/ML FlexPen Inject 3-9 Units into the skin 3 (three) times daily with meals.     . Insulin Glargine (LANTUS SOLOSTAR) 100 UNIT/ML Solostar Pen Inject 10 Units into the skin at bedtime.     . sevelamer carbonate (RENVELA) 800 MG tablet Take 800-2,400 mg by mouth See admin instructions. Take 3 tablets (2400 mg) by mouth two times daily with meals, take 1-2 tablets [phone] mg) with snacks.    Marland Kitchen PEG-KCl-NaCl-NaSulf-Na Asc-C (PLENVU) 140 g SOLR Take 140 g by mouth as directed. BIN: [id number]; GROUP: [id number]; PCN: [id number]; ID: [id number]; SHOULD BE $50 1 each 0   No current facility-administered medications for this visit.       ___________________________________________________________________ Objective   Exam:  BP 128/80 (BP Location: Right Arm, Patient Position: Sitting, Cuff Size: Normal)   Pulse 87   Ht 5\' 11"  (1.803 m)   Wt (!) 207 lb 6 oz (94.1 kg)   SpO2 97%   BMI 28.92 kg/m    General: Well-appearing, pleasant and conversational, normal vocal quality  Eyes: sclera anicteric, no redness  ENT: oral mucosa moist without lesions, no cervical or supraclavicular lymphadenopathy  CV: RRR without murmur, S1/S2, no JVD, no peripheral edema.  Left upper arm fistula  Resp: clear to auscultation bilaterally, normal RR and effort noted  GI: soft, no tenderness, with active bowel sounds. No guarding or palpable organomegaly noted.  Skin; warm and dry, no rash or jaundice noted  Neuro: awake, alert and oriented x 3. Normal gross motor function and fluent speech  Labs:  05/07/19 PCP labs  Glucose 200 Creatinine 8.0 (GFR 7) Sodium 140, potassium 5.4, bicarb 29, albumin 2.9, alkaline phosphatase 156, AST 13, ALT 9, total bilirubin 0.3 WBC 5 hemoglobin 10 platelet 242 TSH 1.6   Assessment: Encounter Diagnoses  Name Primary?  . Nausea and vomiting in adult Yes  . Chronic cough   . Nausea and vomiting, intractability of vomiting not specified, unspecified vomiting type     Nausea and what is either vomiting or perhaps severe GERD, occurring more often laying flat.  Not yet clear how or if that is related to the cough.  He also has chronic sinus congestion and postnasal drip, suggesting there may be an allergic component or perhaps chronic sinusitis.  Regardless of any findings on GI work-up, reevaluation by ENT is warranted in that regard. He believes his last hemoglobin A1c was about 6.8, it must of been prior to his April labs with primary care, which does not have a hemoglobin A1c value in it. Consider gastroparesis/dysrhythmia  Plan: Screening colonoscopy and upper endoscopy for the GI  symptoms. He was agreeable after discussion of procedure and risks.  The benefits and risks of the planned procedure were described in detail with the patient or (when appropriate) their health care proxy.  Risks were outlined as including, but not limited to, bleeding, infection, perforation, adverse medication reaction leading to cardiac or pulmonary decompensation, pancreatitis (if ERCP).  The limitation of incomplete mucosal visualization was also discussed.  No guarantees or warranties were given.  Special considerations of timing due to his diabetes and hemodialysis schedule.  Patient at increased risk for cardiopulmonary complications of procedure due to medical comorbidities.  If question of GERD still not clear after above testing, may need UGIS and perhaps even pH testing, especially as regards the question of relationship to chronic cough.  Gastric emptying study was scheduled.  Thank you for the courtesy of this consult.  Please call me with any questions or concerns.  Nelida Meuse III  CC: Referring provider noted above

## 2019-08-07 NOTE — Patient Instructions (Signed)
If you are age 50 or older, your body mass index should be between 23-30. Your Body mass index is 28.92 kg/m. If this is out of the aforementioned range listed, please consider follow up with your Primary Care Provider.  If you are age 97 or younger, your body mass index should be between 19-25. Your Body mass index is 28.92 kg/m. If this is out of the aformentioned range listed, please consider follow up with your Primary Care Provider.   You have been scheduled for an endoscopy and colonoscopy. Please follow the written instructions given to you at your visit today. Please pick up your prep supplies at the pharmacy within the next 1-3 days. If you use inhalers (even only as needed), please bring them with you on the day of your procedure.  You have been scheduled for a gastric emptying scan at [facility] on 08-25-2019 at [place]. Please arrive at least 15 minutes prior to your appointment for registration. Please make certain not to have anything to eat or drink after midnight the night before your test. Hold all stomach medications (ex: Zofran, phenergan, Reglan) 48 hours prior to your test. If you need to reschedule your appointment, please contact radiology scheduling at [phone]. _____________________________________________________________________ A gastric-emptying study measures how long it takes for food to move through your stomach. There are several ways to measure stomach emptying. In the most common test, you eat food that contains a small amount of radioactive material. A scanner that detects the movement of the radioactive material is placed over your abdomen to monitor the rate at which food leaves your stomach. This test normally takes about 4 hours to complete. _____________________________________________________________________    It was a pleasure to see you today!  Dr. Loletha Carrow

## 2019-08-10 DIAGNOSIS — S82143A Displaced bicondylar fracture of unspecified tibia, initial encounter for closed fracture: Secondary | ICD-10-CM | POA: Diagnosis not present

## 2019-08-10 DIAGNOSIS — N2581 Secondary hyperparathyroidism of renal origin: Secondary | ICD-10-CM | POA: Diagnosis not present

## 2019-08-10 DIAGNOSIS — N186 End stage renal disease: Secondary | ICD-10-CM | POA: Diagnosis not present

## 2019-08-10 DIAGNOSIS — Z992 Dependence on renal dialysis: Secondary | ICD-10-CM | POA: Diagnosis not present

## 2019-08-12 DIAGNOSIS — Z992 Dependence on renal dialysis: Secondary | ICD-10-CM | POA: Diagnosis not present

## 2019-08-12 DIAGNOSIS — N186 End stage renal disease: Secondary | ICD-10-CM | POA: Diagnosis not present

## 2019-08-12 DIAGNOSIS — N2581 Secondary hyperparathyroidism of renal origin: Secondary | ICD-10-CM | POA: Diagnosis not present

## 2019-08-14 DIAGNOSIS — N186 End stage renal disease: Secondary | ICD-10-CM | POA: Diagnosis not present

## 2019-08-14 DIAGNOSIS — N2581 Secondary hyperparathyroidism of renal origin: Secondary | ICD-10-CM | POA: Diagnosis not present

## 2019-08-14 DIAGNOSIS — Z992 Dependence on renal dialysis: Secondary | ICD-10-CM | POA: Diagnosis not present

## 2019-08-15 DIAGNOSIS — Z992 Dependence on renal dialysis: Secondary | ICD-10-CM | POA: Diagnosis not present

## 2019-08-15 DIAGNOSIS — E1122 Type 2 diabetes mellitus with diabetic chronic kidney disease: Secondary | ICD-10-CM | POA: Diagnosis not present

## 2019-08-15 DIAGNOSIS — N186 End stage renal disease: Secondary | ICD-10-CM | POA: Diagnosis not present

## 2019-08-17 DIAGNOSIS — Z992 Dependence on renal dialysis: Secondary | ICD-10-CM | POA: Diagnosis not present

## 2019-08-17 DIAGNOSIS — N186 End stage renal disease: Secondary | ICD-10-CM | POA: Diagnosis not present

## 2019-08-17 DIAGNOSIS — N2581 Secondary hyperparathyroidism of renal origin: Secondary | ICD-10-CM | POA: Diagnosis not present

## 2019-08-19 DIAGNOSIS — N2581 Secondary hyperparathyroidism of renal origin: Secondary | ICD-10-CM | POA: Diagnosis not present

## 2019-08-19 DIAGNOSIS — Z992 Dependence on renal dialysis: Secondary | ICD-10-CM | POA: Diagnosis not present

## 2019-08-19 DIAGNOSIS — N186 End stage renal disease: Secondary | ICD-10-CM | POA: Diagnosis not present

## 2019-08-21 DIAGNOSIS — N186 End stage renal disease: Secondary | ICD-10-CM | POA: Diagnosis not present

## 2019-08-21 DIAGNOSIS — Z992 Dependence on renal dialysis: Secondary | ICD-10-CM | POA: Diagnosis not present

## 2019-08-21 DIAGNOSIS — N2581 Secondary hyperparathyroidism of renal origin: Secondary | ICD-10-CM | POA: Diagnosis not present

## 2019-08-24 DIAGNOSIS — N186 End stage renal disease: Secondary | ICD-10-CM | POA: Diagnosis not present

## 2019-08-24 DIAGNOSIS — N2581 Secondary hyperparathyroidism of renal origin: Secondary | ICD-10-CM | POA: Diagnosis not present

## 2019-08-24 DIAGNOSIS — Z992 Dependence on renal dialysis: Secondary | ICD-10-CM | POA: Diagnosis not present

## 2019-08-25 ENCOUNTER — Encounter (HOSPITAL_COMMUNITY): Payer: BC Managed Care – PPO

## 2019-08-26 DIAGNOSIS — N2581 Secondary hyperparathyroidism of renal origin: Secondary | ICD-10-CM | POA: Diagnosis not present

## 2019-08-26 DIAGNOSIS — Z992 Dependence on renal dialysis: Secondary | ICD-10-CM | POA: Diagnosis not present

## 2019-08-26 DIAGNOSIS — N186 End stage renal disease: Secondary | ICD-10-CM | POA: Diagnosis not present

## 2019-08-28 DIAGNOSIS — N2581 Secondary hyperparathyroidism of renal origin: Secondary | ICD-10-CM | POA: Diagnosis not present

## 2019-08-28 DIAGNOSIS — Z992 Dependence on renal dialysis: Secondary | ICD-10-CM | POA: Diagnosis not present

## 2019-08-28 DIAGNOSIS — N186 End stage renal disease: Secondary | ICD-10-CM | POA: Diagnosis not present

## 2019-08-31 DIAGNOSIS — N2581 Secondary hyperparathyroidism of renal origin: Secondary | ICD-10-CM | POA: Diagnosis not present

## 2019-08-31 DIAGNOSIS — Z992 Dependence on renal dialysis: Secondary | ICD-10-CM | POA: Diagnosis not present

## 2019-08-31 DIAGNOSIS — N186 End stage renal disease: Secondary | ICD-10-CM | POA: Diagnosis not present

## 2019-09-02 DIAGNOSIS — N186 End stage renal disease: Secondary | ICD-10-CM | POA: Diagnosis not present

## 2019-09-02 DIAGNOSIS — N2581 Secondary hyperparathyroidism of renal origin: Secondary | ICD-10-CM | POA: Diagnosis not present

## 2019-09-02 DIAGNOSIS — Z992 Dependence on renal dialysis: Secondary | ICD-10-CM | POA: Diagnosis not present

## 2019-09-04 DIAGNOSIS — Z992 Dependence on renal dialysis: Secondary | ICD-10-CM | POA: Diagnosis not present

## 2019-09-04 DIAGNOSIS — N2581 Secondary hyperparathyroidism of renal origin: Secondary | ICD-10-CM | POA: Diagnosis not present

## 2019-09-04 DIAGNOSIS — N186 End stage renal disease: Secondary | ICD-10-CM | POA: Diagnosis not present

## 2019-09-07 DIAGNOSIS — N186 End stage renal disease: Secondary | ICD-10-CM | POA: Diagnosis not present

## 2019-09-07 DIAGNOSIS — Z992 Dependence on renal dialysis: Secondary | ICD-10-CM | POA: Diagnosis not present

## 2019-09-07 DIAGNOSIS — N2581 Secondary hyperparathyroidism of renal origin: Secondary | ICD-10-CM | POA: Diagnosis not present

## 2019-09-09 DIAGNOSIS — Z992 Dependence on renal dialysis: Secondary | ICD-10-CM | POA: Diagnosis not present

## 2019-09-09 DIAGNOSIS — N2581 Secondary hyperparathyroidism of renal origin: Secondary | ICD-10-CM | POA: Diagnosis not present

## 2019-09-09 DIAGNOSIS — N186 End stage renal disease: Secondary | ICD-10-CM | POA: Diagnosis not present

## 2019-09-10 DIAGNOSIS — S82143A Displaced bicondylar fracture of unspecified tibia, initial encounter for closed fracture: Secondary | ICD-10-CM | POA: Diagnosis not present

## 2019-09-11 DIAGNOSIS — N2581 Secondary hyperparathyroidism of renal origin: Secondary | ICD-10-CM | POA: Diagnosis not present

## 2019-09-11 DIAGNOSIS — N186 End stage renal disease: Secondary | ICD-10-CM | POA: Diagnosis not present

## 2019-09-11 DIAGNOSIS — Z23 Encounter for immunization: Secondary | ICD-10-CM | POA: Diagnosis not present

## 2019-09-11 DIAGNOSIS — Z992 Dependence on renal dialysis: Secondary | ICD-10-CM | POA: Diagnosis not present

## 2019-09-14 DIAGNOSIS — Z992 Dependence on renal dialysis: Secondary | ICD-10-CM | POA: Diagnosis not present

## 2019-09-14 DIAGNOSIS — N2581 Secondary hyperparathyroidism of renal origin: Secondary | ICD-10-CM | POA: Diagnosis not present

## 2019-09-14 DIAGNOSIS — N186 End stage renal disease: Secondary | ICD-10-CM | POA: Diagnosis not present

## 2019-09-15 DIAGNOSIS — Z992 Dependence on renal dialysis: Secondary | ICD-10-CM | POA: Diagnosis not present

## 2019-09-15 DIAGNOSIS — N186 End stage renal disease: Secondary | ICD-10-CM | POA: Diagnosis not present

## 2019-09-15 DIAGNOSIS — E1122 Type 2 diabetes mellitus with diabetic chronic kidney disease: Secondary | ICD-10-CM | POA: Diagnosis not present

## 2019-09-16 DIAGNOSIS — Z992 Dependence on renal dialysis: Secondary | ICD-10-CM | POA: Diagnosis not present

## 2019-09-16 DIAGNOSIS — N2581 Secondary hyperparathyroidism of renal origin: Secondary | ICD-10-CM | POA: Diagnosis not present

## 2019-09-16 DIAGNOSIS — N186 End stage renal disease: Secondary | ICD-10-CM | POA: Diagnosis not present

## 2019-09-18 DIAGNOSIS — N186 End stage renal disease: Secondary | ICD-10-CM | POA: Diagnosis not present

## 2019-09-18 DIAGNOSIS — N2581 Secondary hyperparathyroidism of renal origin: Secondary | ICD-10-CM | POA: Diagnosis not present

## 2019-09-18 DIAGNOSIS — Z992 Dependence on renal dialysis: Secondary | ICD-10-CM | POA: Diagnosis not present

## 2019-09-21 DIAGNOSIS — N186 End stage renal disease: Secondary | ICD-10-CM | POA: Diagnosis not present

## 2019-09-21 DIAGNOSIS — Z992 Dependence on renal dialysis: Secondary | ICD-10-CM | POA: Diagnosis not present

## 2019-09-21 DIAGNOSIS — N2581 Secondary hyperparathyroidism of renal origin: Secondary | ICD-10-CM | POA: Diagnosis not present

## 2019-09-23 DIAGNOSIS — N2581 Secondary hyperparathyroidism of renal origin: Secondary | ICD-10-CM | POA: Diagnosis not present

## 2019-09-23 DIAGNOSIS — Z992 Dependence on renal dialysis: Secondary | ICD-10-CM | POA: Diagnosis not present

## 2019-09-23 DIAGNOSIS — N186 End stage renal disease: Secondary | ICD-10-CM | POA: Diagnosis not present

## 2019-09-25 DIAGNOSIS — N2581 Secondary hyperparathyroidism of renal origin: Secondary | ICD-10-CM | POA: Diagnosis not present

## 2019-09-25 DIAGNOSIS — N186 End stage renal disease: Secondary | ICD-10-CM | POA: Diagnosis not present

## 2019-09-25 DIAGNOSIS — Z992 Dependence on renal dialysis: Secondary | ICD-10-CM | POA: Diagnosis not present

## 2019-09-28 DIAGNOSIS — N186 End stage renal disease: Secondary | ICD-10-CM | POA: Diagnosis not present

## 2019-09-28 DIAGNOSIS — Z992 Dependence on renal dialysis: Secondary | ICD-10-CM | POA: Diagnosis not present

## 2019-09-28 DIAGNOSIS — N2581 Secondary hyperparathyroidism of renal origin: Secondary | ICD-10-CM | POA: Diagnosis not present

## 2019-09-30 DIAGNOSIS — N2581 Secondary hyperparathyroidism of renal origin: Secondary | ICD-10-CM | POA: Diagnosis not present

## 2019-09-30 DIAGNOSIS — N186 End stage renal disease: Secondary | ICD-10-CM | POA: Diagnosis not present

## 2019-09-30 DIAGNOSIS — Z992 Dependence on renal dialysis: Secondary | ICD-10-CM | POA: Diagnosis not present

## 2019-10-02 DIAGNOSIS — Z992 Dependence on renal dialysis: Secondary | ICD-10-CM | POA: Diagnosis not present

## 2019-10-02 DIAGNOSIS — N186 End stage renal disease: Secondary | ICD-10-CM | POA: Diagnosis not present

## 2019-10-02 DIAGNOSIS — N2581 Secondary hyperparathyroidism of renal origin: Secondary | ICD-10-CM | POA: Diagnosis not present

## 2019-10-05 DIAGNOSIS — Z992 Dependence on renal dialysis: Secondary | ICD-10-CM | POA: Diagnosis not present

## 2019-10-05 DIAGNOSIS — N186 End stage renal disease: Secondary | ICD-10-CM | POA: Diagnosis not present

## 2019-10-05 DIAGNOSIS — N2581 Secondary hyperparathyroidism of renal origin: Secondary | ICD-10-CM | POA: Diagnosis not present

## 2019-10-07 DIAGNOSIS — Z992 Dependence on renal dialysis: Secondary | ICD-10-CM | POA: Diagnosis not present

## 2019-10-07 DIAGNOSIS — N186 End stage renal disease: Secondary | ICD-10-CM | POA: Diagnosis not present

## 2019-10-07 DIAGNOSIS — N2581 Secondary hyperparathyroidism of renal origin: Secondary | ICD-10-CM | POA: Diagnosis not present

## 2019-10-09 DIAGNOSIS — Z992 Dependence on renal dialysis: Secondary | ICD-10-CM | POA: Diagnosis not present

## 2019-10-09 DIAGNOSIS — N186 End stage renal disease: Secondary | ICD-10-CM | POA: Diagnosis not present

## 2019-10-09 DIAGNOSIS — N2581 Secondary hyperparathyroidism of renal origin: Secondary | ICD-10-CM | POA: Diagnosis not present

## 2019-10-11 DIAGNOSIS — S82143A Displaced bicondylar fracture of unspecified tibia, initial encounter for closed fracture: Secondary | ICD-10-CM | POA: Diagnosis not present

## 2019-10-12 DIAGNOSIS — Z992 Dependence on renal dialysis: Secondary | ICD-10-CM | POA: Diagnosis not present

## 2019-10-12 DIAGNOSIS — N186 End stage renal disease: Secondary | ICD-10-CM | POA: Diagnosis not present

## 2019-10-12 DIAGNOSIS — N2581 Secondary hyperparathyroidism of renal origin: Secondary | ICD-10-CM | POA: Diagnosis not present

## 2019-10-13 ENCOUNTER — Telehealth: Payer: Self-pay | Admitting: Gastroenterology

## 2019-10-13 ENCOUNTER — Ambulatory Visit (INDEPENDENT_AMBULATORY_CARE_PROVIDER_SITE_OTHER): Payer: BC Managed Care – PPO

## 2019-10-13 ENCOUNTER — Other Ambulatory Visit: Payer: Self-pay | Admitting: Gastroenterology

## 2019-10-13 DIAGNOSIS — Z1159 Encounter for screening for other viral diseases: Secondary | ICD-10-CM | POA: Diagnosis not present

## 2019-10-13 LAB — SARS CORONAVIRUS 2 (TAT 6-24 HRS): SARS Coronavirus 2: NEGATIVE

## 2019-10-13 NOTE — Telephone Encounter (Signed)
Pt called to change his upcoming procedure for 9/30. Pt states he does not wish to do a colonoscopy, only an EGD that day

## 2019-10-14 DIAGNOSIS — Z992 Dependence on renal dialysis: Secondary | ICD-10-CM | POA: Diagnosis not present

## 2019-10-14 DIAGNOSIS — N186 End stage renal disease: Secondary | ICD-10-CM | POA: Diagnosis not present

## 2019-10-14 DIAGNOSIS — N2581 Secondary hyperparathyroidism of renal origin: Secondary | ICD-10-CM | POA: Diagnosis not present

## 2019-10-14 NOTE — Telephone Encounter (Signed)
Patient able to move up appt to 9 am tomorrow.

## 2019-10-14 NOTE — Telephone Encounter (Signed)
Lm on vm for patient to return call 

## 2019-10-14 NOTE — Telephone Encounter (Signed)
That's fine since it is his choice.  Please see if he is able to come earlier tomorrow (8am arrival for 9am EGD).  If so, please change schedule with [facility].

## 2019-10-15 ENCOUNTER — Telehealth: Payer: Self-pay | Admitting: Gastroenterology

## 2019-10-15 ENCOUNTER — Telehealth: Payer: Self-pay

## 2019-10-15 ENCOUNTER — Other Ambulatory Visit: Payer: Self-pay

## 2019-10-15 ENCOUNTER — Encounter: Payer: Self-pay | Admitting: Gastroenterology

## 2019-10-15 ENCOUNTER — Ambulatory Visit (AMBULATORY_SURGERY_CENTER): Payer: BC Managed Care – PPO | Admitting: Gastroenterology

## 2019-10-15 ENCOUNTER — Encounter: Payer: BC Managed Care – PPO | Admitting: Gastroenterology

## 2019-10-15 VITALS — BP 144/61 | HR 77 | Temp 98.0°F | Resp 15 | Ht 71.0 in | Wt 207.0 lb

## 2019-10-15 DIAGNOSIS — R112 Nausea with vomiting, unspecified: Secondary | ICD-10-CM

## 2019-10-15 DIAGNOSIS — K219 Gastro-esophageal reflux disease without esophagitis: Secondary | ICD-10-CM | POA: Diagnosis not present

## 2019-10-15 DIAGNOSIS — K319 Disease of stomach and duodenum, unspecified: Secondary | ICD-10-CM

## 2019-10-15 DIAGNOSIS — Z1211 Encounter for screening for malignant neoplasm of colon: Secondary | ICD-10-CM | POA: Diagnosis not present

## 2019-10-15 DIAGNOSIS — K200 Eosinophilic esophagitis: Secondary | ICD-10-CM

## 2019-10-15 DIAGNOSIS — N186 End stage renal disease: Secondary | ICD-10-CM | POA: Diagnosis not present

## 2019-10-15 DIAGNOSIS — K228 Other specified diseases of esophagus: Secondary | ICD-10-CM | POA: Diagnosis not present

## 2019-10-15 DIAGNOSIS — K3189 Other diseases of stomach and duodenum: Secondary | ICD-10-CM | POA: Diagnosis not present

## 2019-10-15 DIAGNOSIS — E1122 Type 2 diabetes mellitus with diabetic chronic kidney disease: Secondary | ICD-10-CM | POA: Diagnosis not present

## 2019-10-15 DIAGNOSIS — Z992 Dependence on renal dialysis: Secondary | ICD-10-CM | POA: Diagnosis not present

## 2019-10-15 DIAGNOSIS — R053 Chronic cough: Secondary | ICD-10-CM

## 2019-10-15 MED ORDER — SODIUM CHLORIDE 0.9 % IV SOLN: 500.0000 mL | Freq: Once | INTRAVENOUS | Status: DC

## 2019-10-15 NOTE — Progress Notes (Signed)
Pt's states no medical or surgical changes since previsit or office visit. 

## 2019-10-15 NOTE — Telephone Encounter (Signed)
Spoke with patient, see alternate phone note for more information.  

## 2019-10-15 NOTE — Patient Instructions (Signed)
YOU HAD AN ENDOSCOPIC PROCEDURE TODAY AT THE [facility]:   Refer to the procedure report that was given to you for any specific questions about what was found during the examination.  If the procedure report does not answer your questions, please call your gastroenterologist to clarify.  If you requested that your care partner not be given the details of your procedure findings, then the procedure report has been included in a sealed envelope for you to review at your convenience later.  YOU SHOULD EXPECT: Some feelings of bloating in the abdomen. Passage of more gas than usual.  Walking can help get rid of the air that was put into your GI tract during the procedure and reduce the bloating. If you had a lower endoscopy (such as a colonoscopy or flexible sigmoidoscopy) you may notice spotting of blood in your stool or on the toilet paper. If you underwent a bowel prep for your procedure, you may not have a normal bowel movement for a few days.  Please Note:  You might notice some irritation and congestion in your nose or some drainage.  This is from the oxygen used during your procedure.  There is no need for concern and it should clear up in a day or so.  SYMPTOMS TO REPORT IMMEDIATELY:    Following upper endoscopy (EGD)  Vomiting of blood or coffee ground material  New chest pain or pain under the shoulder blades  Painful or persistently difficult swallowing  New shortness of breath  Fever of 100F or higher  Black, tarry-looking stools  For urgent or emergent issues, a gastroenterologist can be reached at any hour by calling [phone]. Do not use MyChart messaging for urgent concerns.    DIET:  We do recommend a small meal at first, but then you may proceed to your regular diet.  Drink plenty of fluids but you should avoid alcoholic beverages for 24 hours.  ACTIVITY:  You should plan to take it easy for the rest of today and you should NOT DRIVE or use heavy machinery  until tomorrow (because of the sedation medicines used during the test).    FOLLOW UP: Our staff will call the number listed on your records 48-72 hours following your procedure to check on you and address any questions or concerns that you may have regarding the information given to you following your procedure. If we do not reach you, we will leave a message.  We will attempt to reach you two times.  During this call, we will ask if you have developed any symptoms of COVID 19. If you develop any symptoms (ie: fever, flu-like symptoms, shortness of breath, cough etc.) before then, please call [phone].  If you test positive for Covid 19 in the 2 weeks post procedure, please call and report this information to us.    If any biopsies were taken you will be contacted by phone or by letter within the next 1-3 weeks.  Please call us at [phone] if you have not heard about the biopsies in 3 weeks.    SIGNATURES/CONFIDENTIALITY: You and/or your care partner have signed paperwork which will be entered into your electronic medical record.  These signatures attest to the fact that that the information above on your After Visit Summary has been reviewed and is understood.  Full responsibility of the confidentiality of this discharge information lies with you and/or your care-partner. 

## 2019-10-15 NOTE — Progress Notes (Signed)
Called to room to assist during endoscopic procedure.  Patient ID and intended procedure confirmed with present staff. Received instructions for my participation in the procedure from the performing physician.  

## 2019-10-15 NOTE — Telephone Encounter (Signed)
Spoke with patient regarding scheduled appointments, pt states that he will have to reschedule gastric emptying scan, provided patient with central scheduling number so that he can get it rescheduled at his convenience. Pt is aware that we will also be sending a letter with this information.

## 2019-10-15 NOTE — Progress Notes (Signed)
To PACU, VSS. Report to Rn.tb 

## 2019-10-15 NOTE — Progress Notes (Signed)
Prior to performing procedure, recent labs were obtained from the patient's HD center.  Hgb 11.0 on 10/07/19 K 4.6 on 09/30/19 and patient had a full run of HD yesterday.  Labs will be scanned to chart.

## 2019-10-15 NOTE — Telephone Encounter (Signed)
-----   Message from [place], MD sent at 10/15/2019  1:24 PM EDT ----- Please arrange a gastric emptying study and an office visit for this patient. Nausea and vomiting, GERD - HD

## 2019-10-15 NOTE — Op Note (Signed)
[facility] Patient Name: Alex Morrison Procedure Date: 10/15/2019 8:58 AM MRN: [id number] Endoscopist: Mallie Mussel L. Loletha Carrow , MD Age: 49 Referring MD:  Date of Birth: 1970/04/14 Gender: Male Account #: [id number] Procedure:                Upper GI endoscopy Indications:              Esophageal reflux symptoms that persist despite                            appropriate therapy, Chronic cough, Nausea with                            vomiting Medicines:                Monitored Anesthesia Care Procedure:                Pre-Anesthesia Assessment:                           - Prior to the procedure, a History and Physical                            was performed, and patient medications and                            allergies were reviewed. The patient's tolerance of                            previous anesthesia was also reviewed. The risks                            and benefits of the procedure and the sedation                            options and risks were discussed with the patient.                            All questions were answered, and informed consent                            was obtained. Prior Anticoagulants: The patient has                            taken no previous anticoagulant or antiplatelet                            agents. ASA Grade Assessment: III - A patient with                            severe systemic disease. After reviewing the risks                            and benefits, the patient was deemed in  satisfactory condition to undergo the procedure.                           After obtaining informed consent, the endoscope was                            passed under direct vision. Throughout the                            procedure, the patient's blood pressure, pulse, and                            oxygen saturations were monitored continuously. The                            Endoscope was introduced through the mouth, and                             advanced to the second part of duodenum. The upper                            GI endoscopy was somewhat difficult due to cough.                            Passage of scope over the toungue elicited a harsh                            coughing and upper airway spasm reflex resuling in                            brief oxygen desaturation to the 70s requiring law                            thrust/chin lift, increased oxygen delivery,                            suctioning and a dose of IV lidocaine. Patient                            recovered to normal oxygenation and no coughing,                            then allowing procedure to be performed. A more                            brief and less evere cough episode happened upon                            scope withdrawal. The patient tolerated the                            procedure. Scope In: Scope Out: Findings:  One tongue of salmon-colored mucosa was present. No                            other visible abnormalities were present. The                            maximum longitudinal extent of these esophageal                            mucosal changes was 2 cm in length. Biopsies were                            taken with a cold forceps for histology.                           Diffuse mucosal changes characterized by                            longitudinal markings were found in the middle                            third of the esophagus and in the lower third of                            the esophagus. Several biopsies were obtained in                            the middle third of the esophagus and in the lower                            third of the esophagus with cold forceps for                            histology.                           Localized congested mucosa was found on the greater                            curvature of the gastric body. Several biopsies                             were obtained on the greater curvature of the                            gastric body and in the gastric antrum with cold                            forceps for histology.                           The exam of the stomach was otherwise normal.  The cardia and gastric fundus were normal on                            retroflexion.                           The examined duodenum was normal. Complications:            No immediate complications. Estimated Blood Loss:     Estimated blood loss was minimal. Impression:               - Salmon-colored mucosa suspicious for                            short-segment Barrett's esophagus. Biopsied.                           - Longitudinally marked mucosa in the esophagus.                           - Congestive gastropathy.                           - Normal examined duodenum.                           - Several biopsies were obtained in the middle                            third of the esophagus and in the lower third of                            the esophagus.                           - Several biopsies were obtained on the greater                            curvature of the gastric body and in the gastric                            antrum.                           It will be very difficult to determine the extent                            to which reflux may be related to this patient's                            chronic cough. Given the experience during this                            procedure (see above), there appears to be a  significant element of hyper-reflexive upper airway                            and possibly upper airway cough syndrome. Recommendation:           - Patient has a contact number available for                            emergencies. The signs and symptoms of potential                            delayed complications were discussed with the                             patient. Return to normal activities tomorrow.                            Written discharge instructions were provided to the                            patient.                           - Resume previous diet.                           - Continue present medications.                           - Await pathology results.                           - Do a gastric emptying study at appointment to be                            scheduled.                           - Return to my office at appointment to be                            scheduled.                           - Repeat and comprehensive ENT evaluation                            recommended. Consider allergic and sinus conditions                            as well as upper airway cough syndrome. Margaree Sandhu L. Loletha Carrow, MD 10/15/2019 10:45:58 AM This report has been signed electronically.

## 2019-10-15 NOTE — Telephone Encounter (Signed)
Patient has been scheduled for a gastric emptying scan at [facility] on 11/06/19 at 7:30 am, arrival time 7:15 am. NPO after midnight.   Patient is scheduled for a follow up with Dr. Loletha Carrow on 11/18/19 at 1:20 pm.  Lm on vm for patient to return call for appointment information. Will mail letter as well.

## 2019-10-16 DIAGNOSIS — N2581 Secondary hyperparathyroidism of renal origin: Secondary | ICD-10-CM | POA: Diagnosis not present

## 2019-10-16 DIAGNOSIS — N186 End stage renal disease: Secondary | ICD-10-CM | POA: Diagnosis not present

## 2019-10-16 DIAGNOSIS — Z992 Dependence on renal dialysis: Secondary | ICD-10-CM | POA: Diagnosis not present

## 2019-10-19 ENCOUNTER — Telehealth: Payer: Self-pay

## 2019-10-19 ENCOUNTER — Telehealth: Payer: Self-pay | Admitting: *Deleted

## 2019-10-19 DIAGNOSIS — N186 End stage renal disease: Secondary | ICD-10-CM | POA: Diagnosis not present

## 2019-10-19 DIAGNOSIS — N2581 Secondary hyperparathyroidism of renal origin: Secondary | ICD-10-CM | POA: Diagnosis not present

## 2019-10-19 DIAGNOSIS — Z992 Dependence on renal dialysis: Secondary | ICD-10-CM | POA: Diagnosis not present

## 2019-10-19 NOTE — Telephone Encounter (Signed)
No answer for post procedure call back. Left message for patient to call with questions or concerns. 

## 2019-10-19 NOTE — Telephone Encounter (Signed)
First attempt follow up call to pt, lm on vm 

## 2019-10-21 DIAGNOSIS — N2581 Secondary hyperparathyroidism of renal origin: Secondary | ICD-10-CM | POA: Diagnosis not present

## 2019-10-21 DIAGNOSIS — Z992 Dependence on renal dialysis: Secondary | ICD-10-CM | POA: Diagnosis not present

## 2019-10-21 DIAGNOSIS — N186 End stage renal disease: Secondary | ICD-10-CM | POA: Diagnosis not present

## 2019-10-23 DIAGNOSIS — Z992 Dependence on renal dialysis: Secondary | ICD-10-CM | POA: Diagnosis not present

## 2019-10-23 DIAGNOSIS — N2581 Secondary hyperparathyroidism of renal origin: Secondary | ICD-10-CM | POA: Diagnosis not present

## 2019-10-23 DIAGNOSIS — N186 End stage renal disease: Secondary | ICD-10-CM | POA: Diagnosis not present

## 2019-10-26 DIAGNOSIS — N2581 Secondary hyperparathyroidism of renal origin: Secondary | ICD-10-CM | POA: Diagnosis not present

## 2019-10-26 DIAGNOSIS — N186 End stage renal disease: Secondary | ICD-10-CM | POA: Diagnosis not present

## 2019-10-26 DIAGNOSIS — Z992 Dependence on renal dialysis: Secondary | ICD-10-CM | POA: Diagnosis not present

## 2019-10-28 DIAGNOSIS — N2581 Secondary hyperparathyroidism of renal origin: Secondary | ICD-10-CM | POA: Diagnosis not present

## 2019-10-28 DIAGNOSIS — N186 End stage renal disease: Secondary | ICD-10-CM | POA: Diagnosis not present

## 2019-10-28 DIAGNOSIS — Z992 Dependence on renal dialysis: Secondary | ICD-10-CM | POA: Diagnosis not present

## 2019-10-30 DIAGNOSIS — N2581 Secondary hyperparathyroidism of renal origin: Secondary | ICD-10-CM | POA: Diagnosis not present

## 2019-10-30 DIAGNOSIS — Z992 Dependence on renal dialysis: Secondary | ICD-10-CM | POA: Diagnosis not present

## 2019-10-30 DIAGNOSIS — N186 End stage renal disease: Secondary | ICD-10-CM | POA: Diagnosis not present

## 2019-11-02 DIAGNOSIS — N186 End stage renal disease: Secondary | ICD-10-CM | POA: Diagnosis not present

## 2019-11-02 DIAGNOSIS — N2581 Secondary hyperparathyroidism of renal origin: Secondary | ICD-10-CM | POA: Diagnosis not present

## 2019-11-02 DIAGNOSIS — Z992 Dependence on renal dialysis: Secondary | ICD-10-CM | POA: Diagnosis not present

## 2019-11-04 DIAGNOSIS — N2581 Secondary hyperparathyroidism of renal origin: Secondary | ICD-10-CM | POA: Diagnosis not present

## 2019-11-04 DIAGNOSIS — Z992 Dependence on renal dialysis: Secondary | ICD-10-CM | POA: Diagnosis not present

## 2019-11-04 DIAGNOSIS — N186 End stage renal disease: Secondary | ICD-10-CM | POA: Diagnosis not present

## 2019-11-06 ENCOUNTER — Encounter (HOSPITAL_COMMUNITY): Payer: BC Managed Care – PPO

## 2019-11-06 DIAGNOSIS — N2581 Secondary hyperparathyroidism of renal origin: Secondary | ICD-10-CM | POA: Diagnosis not present

## 2019-11-06 DIAGNOSIS — N186 End stage renal disease: Secondary | ICD-10-CM | POA: Diagnosis not present

## 2019-11-06 DIAGNOSIS — Z992 Dependence on renal dialysis: Secondary | ICD-10-CM | POA: Diagnosis not present

## 2019-11-09 DIAGNOSIS — Z992 Dependence on renal dialysis: Secondary | ICD-10-CM | POA: Diagnosis not present

## 2019-11-09 DIAGNOSIS — N2581 Secondary hyperparathyroidism of renal origin: Secondary | ICD-10-CM | POA: Diagnosis not present

## 2019-11-09 DIAGNOSIS — N186 End stage renal disease: Secondary | ICD-10-CM | POA: Diagnosis not present

## 2019-11-10 ENCOUNTER — Other Ambulatory Visit: Payer: Self-pay

## 2019-11-10 ENCOUNTER — Encounter (HOSPITAL_COMMUNITY)
Admission: RE | Admit: 2019-11-10 | Discharge: 2019-11-10 | Disposition: A | Discharge status: Home / Self Care | Payer: BC Managed Care – PPO | Source: Ambulatory Visit | Attending: Gastroenterology | Admitting: Gastroenterology

## 2019-11-10 DIAGNOSIS — R112 Nausea with vomiting, unspecified: Secondary | ICD-10-CM | POA: Diagnosis not present

## 2019-11-10 DIAGNOSIS — K219 Gastro-esophageal reflux disease without esophagitis: Secondary | ICD-10-CM | POA: Insufficient documentation

## 2019-11-10 DIAGNOSIS — K30 Functional dyspepsia: Secondary | ICD-10-CM | POA: Diagnosis not present

## 2019-11-10 DIAGNOSIS — S82143A Displaced bicondylar fracture of unspecified tibia, initial encounter for closed fracture: Secondary | ICD-10-CM | POA: Diagnosis not present

## 2019-11-10 MED ORDER — TECHNETIUM TC 99M SULFUR COLLOID
2.2000 | Freq: Once | INTRAVENOUS | Status: AC | PRN
  Administered 2019-11-10: 2.2 via INTRAVENOUS

## 2019-11-11 DIAGNOSIS — N2581 Secondary hyperparathyroidism of renal origin: Secondary | ICD-10-CM | POA: Diagnosis not present

## 2019-11-11 DIAGNOSIS — N186 End stage renal disease: Secondary | ICD-10-CM | POA: Diagnosis not present

## 2019-11-11 DIAGNOSIS — Z992 Dependence on renal dialysis: Secondary | ICD-10-CM | POA: Diagnosis not present

## 2019-11-13 DIAGNOSIS — N2581 Secondary hyperparathyroidism of renal origin: Secondary | ICD-10-CM | POA: Diagnosis not present

## 2019-11-13 DIAGNOSIS — N186 End stage renal disease: Secondary | ICD-10-CM | POA: Diagnosis not present

## 2019-11-13 DIAGNOSIS — Z992 Dependence on renal dialysis: Secondary | ICD-10-CM | POA: Diagnosis not present

## 2019-11-15 DIAGNOSIS — E1122 Type 2 diabetes mellitus with diabetic chronic kidney disease: Secondary | ICD-10-CM | POA: Diagnosis not present

## 2019-11-15 DIAGNOSIS — Z992 Dependence on renal dialysis: Secondary | ICD-10-CM | POA: Diagnosis not present

## 2019-11-15 DIAGNOSIS — N186 End stage renal disease: Secondary | ICD-10-CM | POA: Diagnosis not present

## 2019-11-16 DIAGNOSIS — N186 End stage renal disease: Secondary | ICD-10-CM | POA: Diagnosis not present

## 2019-11-16 DIAGNOSIS — N2581 Secondary hyperparathyroidism of renal origin: Secondary | ICD-10-CM | POA: Diagnosis not present

## 2019-11-16 DIAGNOSIS — Z992 Dependence on renal dialysis: Secondary | ICD-10-CM | POA: Diagnosis not present

## 2019-11-18 ENCOUNTER — Ambulatory Visit: Payer: BC Managed Care – PPO | Admitting: Gastroenterology

## 2019-11-18 ENCOUNTER — Encounter: Payer: Self-pay | Admitting: Gastroenterology

## 2019-11-18 VITALS — BP 120/70 | HR 80 | Ht 70.0 in | Wt 207.1 lb

## 2019-11-18 DIAGNOSIS — N186 End stage renal disease: Secondary | ICD-10-CM | POA: Diagnosis not present

## 2019-11-18 DIAGNOSIS — N2581 Secondary hyperparathyroidism of renal origin: Secondary | ICD-10-CM | POA: Diagnosis not present

## 2019-11-18 DIAGNOSIS — K3184 Gastroparesis: Secondary | ICD-10-CM | POA: Diagnosis not present

## 2019-11-18 DIAGNOSIS — R112 Nausea with vomiting, unspecified: Secondary | ICD-10-CM

## 2019-11-18 DIAGNOSIS — Z992 Dependence on renal dialysis: Secondary | ICD-10-CM | POA: Diagnosis not present

## 2019-11-18 DIAGNOSIS — K200 Eosinophilic esophagitis: Secondary | ICD-10-CM | POA: Diagnosis not present

## 2019-11-18 MED ORDER — PANTOPRAZOLE SODIUM 40 MG PO TBEC: 40.0000 mg | DELAYED_RELEASE_TABLET | Freq: Every day | ORAL | 0 refills | Status: DC

## 2019-11-18 MED ORDER — METOCLOPRAMIDE HCL 5 MG PO TABS: 5.0000 mg | ORAL_TABLET | Freq: Three times a day (TID) | ORAL | 1 refills | Status: DC

## 2019-11-18 NOTE — Patient Instructions (Signed)
If you are age 50 or older, your body mass index should be between 23-30. Your Body mass index is 29.72 kg/m. If this is out of the aforementioned range listed, please consider follow up with your Primary Care Provider.  If you are age 21 or younger, your body mass index should be between 19-25. Your Body mass index is 29.72 kg/m. If this is out of the aformentioned range listed, please consider follow up with your Primary Care Provider.   We will end your records to Dr Salvatore Marvel (allergist)    Gastroparesis  Gastroparesis is a condition in which food takes longer than normal to empty from the stomach. The condition is usually long-lasting (chronic). It may also be called delayed gastric emptying. There is no cure, but there are treatments and things that you can do at home to help relieve symptoms. Treating the underlying condition that causes gastroparesis can also help relieve symptoms. What are the causes? In many cases, the cause of this condition is not known. Possible causes include:  A hormone (endocrine) disorder, such as hypothyroidism or diabetes.  A nervous system disease, such as Parkinson's disease or multiple sclerosis.  Cancer, infection, or surgery that affects the stomach or vagus nerve. The vagus nerve runs from your chest, through your neck, to the lower part of your brain.  A connective tissue disorder, such as scleroderma.  Certain medicines. What increases the risk? You are more likely to develop this condition if you:  Have certain disorders or diseases, including: ? An endocrine disorder. ? An eating disorder. ? Amyloidosis. ? Scleroderma. ? Parkinson's disease. ? Multiple sclerosis. ? Cancer or infection of the stomach or the vagus nerve.  Have had surgery on the stomach or vagus nerve.  Take certain medicines.  Are male. What are the signs or symptoms? Symptoms of this condition include:  Feeling full after eating very  little.  Nausea.  Vomiting.  Heartburn.  Abdominal bloating.  Inconsistent blood sugar (glucose) levels on blood tests.  Lack of appetite.  Weight loss.  Acid from the stomach coming up into the esophagus (gastroesophageal reflux).  Sudden tightening (spasm) of the stomach, which can be painful. Symptoms may come and go. Some people may not notice any symptoms. How is this diagnosed? This condition is diagnosed with tests, such as:  Tests that check how long it takes food to move through the stomach and intestines. These tests include: ? Upper gastrointestinal (GI) series. For this test, you drink a liquid that shows up well on X-rays, and then X-rays will be taken of your intestines. ? Gastric emptying scintigraphy. For this test, you eat food that contains a small amount of radioactive material, and then scans are taken. ? Wireless capsule GI monitoring system. For this test, you swallow a pill (capsule) that records information about how foods and fluid move through your stomach.  Gastric manometry. For this test, a tube is passed down your throat and into your stomach to measure electrical and muscular activity.  Endoscopy. For this test, a long, thin tube is passed down your throat and into your stomach to check for problems in your stomach lining.  Ultrasound. This test uses sound waves to create images of inside the body. This can help rule out gallbladder disease or pancreatitis as a cause of your symptoms. How is this treated? There is no cure for gastroparesis. Treatment may include:  Treating the underlying cause.  Managing your symptoms by making changes to your diet and  exercise habits.  Taking medicines to control nausea and vomiting and to stimulate stomach muscles.  Getting food through a feeding tube in the hospital. This may be done in severe cases.  Having surgery to insert a device into your body that helps improve stomach emptying and control nausea  and vomiting (gastric neurostimulator). Follow these instructions at home:  Take over-the-counter and prescription medicines only as told by your health care provider.  Follow instructions from your health care provider about eating or drinking restrictions. Your health care provider may recommend that you: ? Eat smaller meals more often. ? Eat low-fat foods. ? Eat low-fiber forms of high-fiber foods. For example, eat cooked vegetables instead of raw vegetables. ? Have only liquid foods instead of solid foods. Liquid foods are easier to digest.  Drink enough fluid to keep your urine pale yellow.  Exercise as often as told by your health care provider.  Keep all follow-up visits as told by your health care provider. This is important. Contact a health care provider if you:  Notice that your symptoms do not improve with treatment.  Have new symptoms. Get help right away if you:  Have severe abdominal pain that does not improve with treatment.  Have nausea that is severe or does not go away.  Cannot drink fluids without vomiting. Summary  Gastroparesis is a chronic condition in which food takes longer than normal to empty from the stomach.  Symptoms include nausea, vomiting, heartburn, abdominal bloating, and loss of appetite.  Eating smaller portions, and low-fat, low-fiber foods may help you manage your symptoms.  Get help right away if you have severe abdominal pain. This information is not intended to replace advice given to you by your health care provider. Make sure you discuss any questions you have with your health care provider. Document Revised: 04/01/2017 Document Reviewed: 11/06/2016 Elsevier Patient Education  [employer].   It was a pleasure to see you today!  Dr. Loletha Carrow

## 2019-11-18 NOTE — Progress Notes (Signed)
[facility] Progress Note  Chief Complaint: Nausea and vomiting  Subjective  History: Alex Morrison was first seen in July for N/V, chronic cough, all of which feels about the same.  Underlying diabetes, ESRD on dialysis.  He has occasional feelings of food stuck in the chest. Upper endoscopy report on file.  He had a pronounced cough reflex during scope passage.  ROS: Cardiovascular:  no chest pain Respiratory: no dyspnea Chronic dry cough, occurs in fits and can be quite severe.  He might cough to the point of feeling nauseated and vomiting. Remainder of systems negative except as above The patient's Past Medical, Family and Social History were reviewed and are on file in the EMR.  Objective:  Med list reviewed  Current Outpatient Medications:    insulin aspart (NOVOLOG FLEXPEN) 100 UNIT/ML FlexPen, Inject 3-9 Units into the skin 3 (three) times daily with meals. , Disp: , Rfl:    Insulin Glargine (LANTUS SOLOSTAR) 100 UNIT/ML Solostar Pen, Inject 10 Units into the skin at bedtime. , Disp: , Rfl:    sevelamer carbonate (RENVELA) 800 MG tablet, Take 800-2,400 mg by mouth See admin instructions. Take 3 tablets (2400 mg) by mouth two times daily with meals, take 1-2 tablets ([id number] mg) with snacks., Disp: , Rfl:    Vital signs in last 24 hrs: Vitals:   11/18/19 1314  BP: 120/70  Pulse: 80    Physical Exam  Fatigued (had dialysis today), not acutely ill-appearing, restricted affect as before  HEENT: sclera anicteric, oral mucosa moist without lesions  Neck: supple, no thyromegaly, JVD or lymphadenopathy  Cardiac: RRR with soft systolic murmur, P6P9 heard, no peripheral edema  Pulm: clear to auscultation bilaterally, normal RR and effort noted  Abdomen: soft, no tenderness, with active bowel sounds. No guarding or palpable hepatosplenomegaly.  Skin; warm and dry, no jaundice or rash  Labs:  QTc 460 on Feb 2021  EKG  ___________________________________________ Radiologic studies: CLINICAL DATA:  Nausea, vomiting, GERD, early satiety, history of diabetes mellitus   EXAM: NUCLEAR MEDICINE GASTRIC EMPTYING SCAN   TECHNIQUE: After oral ingestion of radiolabeled meal, sequential abdominal images were obtained for 4 hours. Percentage of activity emptying the stomach was calculated at 1 hour, 2 hour, 3 hour, and 4 hours.   RADIOPHARMACEUTICALS:  2.2 mCi Tc-90m sulfur colloid in standardized meal   COMPARISON:  None   FINDINGS: Expected location of the stomach in the left upper quadrant.   Ingested meal empties the stomach slowly over the course of the study.   18% emptied at 1 hr ( normal >= 10%)   33% emptied at 2 hr ( normal >= 40%)   48% emptied at 3 hr ( normal >= 70%)   63% emptied at 4 hr ( normal >= 90%)   IMPRESSION: Delayed gastric emptying.   ____________________________________________ Other: 1. Surgical [P], esophagus, eg junction - REFLUX CHANGES. - NO INTESTINAL METAPLASIA, DYSPLASIA, OR MALIGNANCY. 2. Surgical [P], gastric antrum - MILD REACTIVE CHANGES. - WARTHIN-STARRY IS NEGATIVE FOR HELICOBACTER PYLORI. - NO INTESTINAL METAPLASIA, DYSPLASIA, OR MALIGNANCY. 3. Surgical [P], gastric body - MILD REACTIVE GASTROPATHY. Alex Morrison IS NEGATIVE FOR HELICOBACTER PYLORI. - NO INTESTINAL METAPLASIA, DYSPLASIA, OR MALIGNANCY. 4. Surgical [P], distal esophagus - INCREASED INTRAEPITHELIAL EOSINOPHILS (>20 PER HIGH POWER FIELD), SEE COMMENT. - NO INTESTINAL METAPLASIA, DYSPLASIA, OR MALIGNANCY. 5. Surgical [P], mid esophagus - INCREASED INTRAEPITHELIAL EOSINOPHILS (>25 PER HIGH POWER FIELD), SEE COMMENT. - NO INTESTINAL METAPLASIA, DYSPLASIA, OR MALIGNANCY.  _____________________________________________ Assessment &  Plan  Assessment: Encounter Diagnoses  Name Primary?   Gastroparesis Yes   Nausea and vomiting in adult    Eosinophilic esophagitis     Alex Morrison has chronic nausea and vomiting from gastroparesis that I believe is related to suboptimally controlled diabetes as well as end-stage renal disease.  There is mild eosinophilic esophagitis that is less symptomatic.  We discussed the unclear nature of this condition, possibility may be related to reflux or some allergic trigger.  He does not seem to experience regurgitation and heartburn much at all.  I wonder if the findings may be related to chronic vomiting.  Barrett's esophagus was suspected but not found on biopsies.  I recommended a trial of metoclopramide 5 mg 3 times daily before meals.  I discussed the potential cardiac arrhythmia and neurologic side effects of this medicine.  Given the severity of symptoms and difficulty controlling his diabetes, I recommend that we try it and see if he tolerates it.  Gastroparesis dietary written information also given. I have also started him on pantoprazole 40 mg once daily.  I was clear that it is typically very difficult to determine to what extent reflux is causing a chronic cough.  If cough does not improve with this medical treatment of gastroparesis and possible GERD, then pulmonary consult is warranted.  See endoscopy report, I think this patient probably has some element of upper airway cough syndrome. Lastly, I have referred Alex Morrison to an allergist for some testing related to EOE.   30 minutes were spent on this encounter (including chart review, history/exam, counseling/coordination of care, and documentation)  Nelida Meuse III

## 2019-11-20 DIAGNOSIS — E1129 Type 2 diabetes mellitus with other diabetic kidney complication: Secondary | ICD-10-CM | POA: Diagnosis not present

## 2019-11-20 DIAGNOSIS — N2581 Secondary hyperparathyroidism of renal origin: Secondary | ICD-10-CM | POA: Diagnosis not present

## 2019-11-20 DIAGNOSIS — K3184 Gastroparesis: Secondary | ICD-10-CM | POA: Diagnosis not present

## 2019-11-20 DIAGNOSIS — Z23 Encounter for immunization: Secondary | ICD-10-CM | POA: Diagnosis not present

## 2019-11-20 DIAGNOSIS — Z992 Dependence on renal dialysis: Secondary | ICD-10-CM | POA: Diagnosis not present

## 2019-11-20 DIAGNOSIS — N186 End stage renal disease: Secondary | ICD-10-CM | POA: Diagnosis not present

## 2019-11-23 DIAGNOSIS — N2581 Secondary hyperparathyroidism of renal origin: Secondary | ICD-10-CM | POA: Diagnosis not present

## 2019-11-23 DIAGNOSIS — N186 End stage renal disease: Secondary | ICD-10-CM | POA: Diagnosis not present

## 2019-11-23 DIAGNOSIS — Z992 Dependence on renal dialysis: Secondary | ICD-10-CM | POA: Diagnosis not present

## 2019-11-25 DIAGNOSIS — N186 End stage renal disease: Secondary | ICD-10-CM | POA: Diagnosis not present

## 2019-11-25 DIAGNOSIS — Z992 Dependence on renal dialysis: Secondary | ICD-10-CM | POA: Diagnosis not present

## 2019-11-25 DIAGNOSIS — N2581 Secondary hyperparathyroidism of renal origin: Secondary | ICD-10-CM | POA: Diagnosis not present

## 2019-11-27 DIAGNOSIS — Z992 Dependence on renal dialysis: Secondary | ICD-10-CM | POA: Diagnosis not present

## 2019-11-27 DIAGNOSIS — N186 End stage renal disease: Secondary | ICD-10-CM | POA: Diagnosis not present

## 2019-11-27 DIAGNOSIS — N2581 Secondary hyperparathyroidism of renal origin: Secondary | ICD-10-CM | POA: Diagnosis not present

## 2019-11-30 DIAGNOSIS — N2581 Secondary hyperparathyroidism of renal origin: Secondary | ICD-10-CM | POA: Diagnosis not present

## 2019-11-30 DIAGNOSIS — Z992 Dependence on renal dialysis: Secondary | ICD-10-CM | POA: Diagnosis not present

## 2019-11-30 DIAGNOSIS — N186 End stage renal disease: Secondary | ICD-10-CM | POA: Diagnosis not present

## 2019-12-02 DIAGNOSIS — Z992 Dependence on renal dialysis: Secondary | ICD-10-CM | POA: Diagnosis not present

## 2019-12-02 DIAGNOSIS — N2581 Secondary hyperparathyroidism of renal origin: Secondary | ICD-10-CM | POA: Diagnosis not present

## 2019-12-02 DIAGNOSIS — N186 End stage renal disease: Secondary | ICD-10-CM | POA: Diagnosis not present

## 2019-12-04 DIAGNOSIS — N2581 Secondary hyperparathyroidism of renal origin: Secondary | ICD-10-CM | POA: Diagnosis not present

## 2019-12-04 DIAGNOSIS — N186 End stage renal disease: Secondary | ICD-10-CM | POA: Diagnosis not present

## 2019-12-04 DIAGNOSIS — Z992 Dependence on renal dialysis: Secondary | ICD-10-CM | POA: Diagnosis not present

## 2019-12-06 DIAGNOSIS — N186 End stage renal disease: Secondary | ICD-10-CM | POA: Diagnosis not present

## 2019-12-06 DIAGNOSIS — Z992 Dependence on renal dialysis: Secondary | ICD-10-CM | POA: Diagnosis not present

## 2019-12-06 DIAGNOSIS — N2581 Secondary hyperparathyroidism of renal origin: Secondary | ICD-10-CM | POA: Diagnosis not present

## 2019-12-08 DIAGNOSIS — N186 End stage renal disease: Secondary | ICD-10-CM | POA: Diagnosis not present

## 2019-12-08 DIAGNOSIS — N2581 Secondary hyperparathyroidism of renal origin: Secondary | ICD-10-CM | POA: Diagnosis not present

## 2019-12-08 DIAGNOSIS — Z992 Dependence on renal dialysis: Secondary | ICD-10-CM | POA: Diagnosis not present

## 2019-12-11 DIAGNOSIS — N2581 Secondary hyperparathyroidism of renal origin: Secondary | ICD-10-CM | POA: Diagnosis not present

## 2019-12-11 DIAGNOSIS — Z992 Dependence on renal dialysis: Secondary | ICD-10-CM | POA: Diagnosis not present

## 2019-12-11 DIAGNOSIS — N186 End stage renal disease: Secondary | ICD-10-CM | POA: Diagnosis not present

## 2019-12-14 DIAGNOSIS — N186 End stage renal disease: Secondary | ICD-10-CM | POA: Diagnosis not present

## 2019-12-14 DIAGNOSIS — Z992 Dependence on renal dialysis: Secondary | ICD-10-CM | POA: Diagnosis not present

## 2019-12-14 DIAGNOSIS — N2581 Secondary hyperparathyroidism of renal origin: Secondary | ICD-10-CM | POA: Diagnosis not present

## 2019-12-15 DIAGNOSIS — E1122 Type 2 diabetes mellitus with diabetic chronic kidney disease: Secondary | ICD-10-CM | POA: Diagnosis not present

## 2019-12-15 DIAGNOSIS — Z992 Dependence on renal dialysis: Secondary | ICD-10-CM | POA: Diagnosis not present

## 2019-12-15 DIAGNOSIS — N186 End stage renal disease: Secondary | ICD-10-CM | POA: Diagnosis not present

## 2019-12-16 DIAGNOSIS — Z992 Dependence on renal dialysis: Secondary | ICD-10-CM | POA: Diagnosis not present

## 2019-12-16 DIAGNOSIS — N2581 Secondary hyperparathyroidism of renal origin: Secondary | ICD-10-CM | POA: Diagnosis not present

## 2019-12-16 DIAGNOSIS — N186 End stage renal disease: Secondary | ICD-10-CM | POA: Diagnosis not present

## 2019-12-18 DIAGNOSIS — N186 End stage renal disease: Secondary | ICD-10-CM | POA: Diagnosis not present

## 2019-12-18 DIAGNOSIS — Z992 Dependence on renal dialysis: Secondary | ICD-10-CM | POA: Diagnosis not present

## 2019-12-18 DIAGNOSIS — N2581 Secondary hyperparathyroidism of renal origin: Secondary | ICD-10-CM | POA: Diagnosis not present

## 2019-12-21 DIAGNOSIS — N2581 Secondary hyperparathyroidism of renal origin: Secondary | ICD-10-CM | POA: Diagnosis not present

## 2019-12-21 DIAGNOSIS — N186 End stage renal disease: Secondary | ICD-10-CM | POA: Diagnosis not present

## 2019-12-21 DIAGNOSIS — Z992 Dependence on renal dialysis: Secondary | ICD-10-CM | POA: Diagnosis not present

## 2019-12-23 DIAGNOSIS — N186 End stage renal disease: Secondary | ICD-10-CM | POA: Diagnosis not present

## 2019-12-23 DIAGNOSIS — Z992 Dependence on renal dialysis: Secondary | ICD-10-CM | POA: Diagnosis not present

## 2019-12-23 DIAGNOSIS — N2581 Secondary hyperparathyroidism of renal origin: Secondary | ICD-10-CM | POA: Diagnosis not present

## 2019-12-25 DIAGNOSIS — N186 End stage renal disease: Secondary | ICD-10-CM | POA: Diagnosis not present

## 2019-12-25 DIAGNOSIS — Z992 Dependence on renal dialysis: Secondary | ICD-10-CM | POA: Diagnosis not present

## 2019-12-25 DIAGNOSIS — N2581 Secondary hyperparathyroidism of renal origin: Secondary | ICD-10-CM | POA: Diagnosis not present

## 2019-12-28 DIAGNOSIS — Z992 Dependence on renal dialysis: Secondary | ICD-10-CM | POA: Diagnosis not present

## 2019-12-28 DIAGNOSIS — N186 End stage renal disease: Secondary | ICD-10-CM | POA: Diagnosis not present

## 2019-12-28 DIAGNOSIS — N2581 Secondary hyperparathyroidism of renal origin: Secondary | ICD-10-CM | POA: Diagnosis not present

## 2019-12-30 DIAGNOSIS — Z992 Dependence on renal dialysis: Secondary | ICD-10-CM | POA: Diagnosis not present

## 2019-12-30 DIAGNOSIS — N2581 Secondary hyperparathyroidism of renal origin: Secondary | ICD-10-CM | POA: Diagnosis not present

## 2019-12-30 DIAGNOSIS — N186 End stage renal disease: Secondary | ICD-10-CM | POA: Diagnosis not present

## 2020-01-01 ENCOUNTER — Encounter: Payer: Self-pay | Admitting: Gastroenterology

## 2020-01-01 ENCOUNTER — Ambulatory Visit: Payer: BC Managed Care – PPO | Admitting: Gastroenterology

## 2020-01-01 VITALS — BP 120/64 | HR 80 | Ht 71.0 in | Wt 216.0 lb

## 2020-01-01 DIAGNOSIS — K200 Eosinophilic esophagitis: Secondary | ICD-10-CM

## 2020-01-01 DIAGNOSIS — N2581 Secondary hyperparathyroidism of renal origin: Secondary | ICD-10-CM | POA: Diagnosis not present

## 2020-01-01 DIAGNOSIS — R112 Nausea with vomiting, unspecified: Secondary | ICD-10-CM

## 2020-01-01 DIAGNOSIS — E1143 Type 2 diabetes mellitus with diabetic autonomic (poly)neuropathy: Secondary | ICD-10-CM | POA: Diagnosis not present

## 2020-01-01 DIAGNOSIS — N186 End stage renal disease: Secondary | ICD-10-CM | POA: Diagnosis not present

## 2020-01-01 DIAGNOSIS — R053 Chronic cough: Secondary | ICD-10-CM | POA: Diagnosis not present

## 2020-01-01 DIAGNOSIS — Z992 Dependence on renal dialysis: Secondary | ICD-10-CM | POA: Diagnosis not present

## 2020-01-01 DIAGNOSIS — K3184 Gastroparesis: Secondary | ICD-10-CM

## 2020-01-01 NOTE — Patient Instructions (Signed)
If you are age 50 or older, your body mass index should be between 23-30. Your Body mass index is 30.13 kg/m. If this is out of the aforementioned range listed, please consider follow up with your Primary Care Provider.  If you are age 25 or younger, your body mass index should be between 19-25. Your Body mass index is 30.13 kg/m. If this is out of the aformentioned range listed, please consider follow up with your Primary Care Provider.    You have been referred to both Pulmonology and Allergy. You should hear from their office for an appointment within a few weeks.  [facility]  [facility] [phone]   It was great seeing you today!  Thank you for entrusting me with your care and choosing [facility].  Dr. Loletha Carrow

## 2020-01-01 NOTE — Progress Notes (Signed)
     [address] [facility] Progress Note  Chief Complaint: diabetic gastroparesis and EoE  Subjective  History:  Alex Morrison's nausea vomiting early satiety are much improved on metoclopramide 5 mg 3 times daily before meals His chronic cough is not improved on this treatment or PPI. He denies sputum production hemoptysis  ROS: Cardiovascular:  no chest pain Denies urinary symptoms  The patient's Past Medical, Family and Social History were reviewed and are on file in the EMR.  Objective:  Med list reviewed  Current Outpatient Medications:  .  insulin aspart (NOVOLOG) 100 UNIT/ML FlexPen, Inject 3-9 Units into the skin 3 (three) times daily with meals. , Disp: , Rfl:  .  insulin glargine (LANTUS) 100 UNIT/ML Solostar Pen, Inject 10 Units into the skin at bedtime. , Disp: , Rfl:  .  metoCLOPramide (REGLAN) 5 MG tablet, Take 1 tablet (5 mg total) by mouth 3 (three) times daily before meals., Disp: 90 tablet, Rfl: 1 .  pantoprazole (PROTONIX) 40 MG tablet, Take 1 tablet (40 mg total) by mouth daily., Disp: 90 tablet, Rfl: 0 .  sevelamer carbonate (RENVELA) 800 MG tablet, Take 800-2,400 mg by mouth See admin instructions. Take 3 tablets (2400 mg) by mouth two times daily with meals, take 1-2 tablets ([phone] mg) with snacks., Disp: , Rfl:    Vital signs in last 24 hrs: Vitals:   01/01/20 1336  BP: 120/64  Pulse: 80    Physical Exam  Well-appearing  Cardiac: RRR without murmurs, S1S2 heard, no peripheral edema  Pulm: clear to auscultation bilaterally, normal RR and effort noted  Abdomen: soft, no tenderness, with active bowel sounds. No guarding or palpable hepatosplenomegaly.  Labs:   ___________________________________________ Radiologic studies:   ____________________________________________ Other:   _____________________________________________ Assessment & Plan  Assessment: Encounter Diagnoses  Name Primary?  . Diabetic gastroparesis ([place]) Yes  . Nausea and  vomiting in adult   . Eosinophilic esophagitis   . Chronic cough    Gastroparesis symptoms improved on metoclopramide, and he is tolerating it well so far.  He denies tremor twitches or other apparent side effects from it.  We will continue it at the same dose  Eosinophilic esophagitis but without dysphagia.  I think it may be related to GERD or the vomiting he was having before metoclopramide treatment. Still, I am doubtful that GERD explains his chronic cough.  Based on his clinical symptoms as described and are experiencing upper endoscopy, I think he has an upper airway cough syndrome, with a possible allergic component.   Plan: Referral to [facility] for chronic cough Referral to [facility] for EOE and chronic cough. See me in 3 months or sooner as needed  20 minutes were spent on this encounter (including chart review, history/exam, counseling/coordination of care, and documentation)  Nelida Meuse III

## 2020-01-04 DIAGNOSIS — Z992 Dependence on renal dialysis: Secondary | ICD-10-CM | POA: Diagnosis not present

## 2020-01-04 DIAGNOSIS — N186 End stage renal disease: Secondary | ICD-10-CM | POA: Diagnosis not present

## 2020-01-04 DIAGNOSIS — N2581 Secondary hyperparathyroidism of renal origin: Secondary | ICD-10-CM | POA: Diagnosis not present

## 2020-01-06 DIAGNOSIS — N2581 Secondary hyperparathyroidism of renal origin: Secondary | ICD-10-CM | POA: Diagnosis not present

## 2020-01-06 DIAGNOSIS — Z992 Dependence on renal dialysis: Secondary | ICD-10-CM | POA: Diagnosis not present

## 2020-01-06 DIAGNOSIS — N186 End stage renal disease: Secondary | ICD-10-CM | POA: Diagnosis not present

## 2020-01-08 DIAGNOSIS — Z992 Dependence on renal dialysis: Secondary | ICD-10-CM | POA: Diagnosis not present

## 2020-01-08 DIAGNOSIS — N186 End stage renal disease: Secondary | ICD-10-CM | POA: Diagnosis not present

## 2020-01-08 DIAGNOSIS — N2581 Secondary hyperparathyroidism of renal origin: Secondary | ICD-10-CM | POA: Diagnosis not present

## 2020-01-11 DIAGNOSIS — Z992 Dependence on renal dialysis: Secondary | ICD-10-CM | POA: Diagnosis not present

## 2020-01-11 DIAGNOSIS — N2581 Secondary hyperparathyroidism of renal origin: Secondary | ICD-10-CM | POA: Diagnosis not present

## 2020-01-11 DIAGNOSIS — N186 End stage renal disease: Secondary | ICD-10-CM | POA: Diagnosis not present

## 2020-01-13 DIAGNOSIS — N186 End stage renal disease: Secondary | ICD-10-CM | POA: Diagnosis not present

## 2020-01-13 DIAGNOSIS — N2581 Secondary hyperparathyroidism of renal origin: Secondary | ICD-10-CM | POA: Diagnosis not present

## 2020-01-13 DIAGNOSIS — Z992 Dependence on renal dialysis: Secondary | ICD-10-CM | POA: Diagnosis not present

## 2020-01-15 DIAGNOSIS — E1122 Type 2 diabetes mellitus with diabetic chronic kidney disease: Secondary | ICD-10-CM | POA: Diagnosis not present

## 2020-01-15 DIAGNOSIS — N186 End stage renal disease: Secondary | ICD-10-CM | POA: Diagnosis not present

## 2020-01-15 DIAGNOSIS — N2581 Secondary hyperparathyroidism of renal origin: Secondary | ICD-10-CM | POA: Diagnosis not present

## 2020-01-15 DIAGNOSIS — Z992 Dependence on renal dialysis: Secondary | ICD-10-CM | POA: Diagnosis not present

## 2020-01-18 DIAGNOSIS — N2581 Secondary hyperparathyroidism of renal origin: Secondary | ICD-10-CM | POA: Diagnosis not present

## 2020-01-18 DIAGNOSIS — N186 End stage renal disease: Secondary | ICD-10-CM | POA: Diagnosis not present

## 2020-01-18 DIAGNOSIS — Z992 Dependence on renal dialysis: Secondary | ICD-10-CM | POA: Diagnosis not present

## 2020-01-19 ENCOUNTER — Other Ambulatory Visit: Payer: Self-pay | Admitting: Gastroenterology

## 2020-01-20 DIAGNOSIS — N2581 Secondary hyperparathyroidism of renal origin: Secondary | ICD-10-CM | POA: Diagnosis not present

## 2020-01-20 DIAGNOSIS — N186 End stage renal disease: Secondary | ICD-10-CM | POA: Diagnosis not present

## 2020-01-20 DIAGNOSIS — Z992 Dependence on renal dialysis: Secondary | ICD-10-CM | POA: Diagnosis not present

## 2020-01-22 DIAGNOSIS — Z992 Dependence on renal dialysis: Secondary | ICD-10-CM | POA: Diagnosis not present

## 2020-01-22 DIAGNOSIS — N2581 Secondary hyperparathyroidism of renal origin: Secondary | ICD-10-CM | POA: Diagnosis not present

## 2020-01-22 DIAGNOSIS — N186 End stage renal disease: Secondary | ICD-10-CM | POA: Diagnosis not present

## 2020-01-25 DIAGNOSIS — N2581 Secondary hyperparathyroidism of renal origin: Secondary | ICD-10-CM | POA: Diagnosis not present

## 2020-01-25 DIAGNOSIS — Z992 Dependence on renal dialysis: Secondary | ICD-10-CM | POA: Diagnosis not present

## 2020-01-25 DIAGNOSIS — N186 End stage renal disease: Secondary | ICD-10-CM | POA: Diagnosis not present

## 2020-01-27 DIAGNOSIS — N186 End stage renal disease: Secondary | ICD-10-CM | POA: Diagnosis not present

## 2020-01-27 DIAGNOSIS — Z992 Dependence on renal dialysis: Secondary | ICD-10-CM | POA: Diagnosis not present

## 2020-01-27 DIAGNOSIS — N2581 Secondary hyperparathyroidism of renal origin: Secondary | ICD-10-CM | POA: Diagnosis not present

## 2020-01-29 DIAGNOSIS — Z992 Dependence on renal dialysis: Secondary | ICD-10-CM | POA: Diagnosis not present

## 2020-01-29 DIAGNOSIS — N186 End stage renal disease: Secondary | ICD-10-CM | POA: Diagnosis not present

## 2020-01-29 DIAGNOSIS — N2581 Secondary hyperparathyroidism of renal origin: Secondary | ICD-10-CM | POA: Diagnosis not present

## 2020-02-01 DIAGNOSIS — Z992 Dependence on renal dialysis: Secondary | ICD-10-CM | POA: Diagnosis not present

## 2020-02-01 DIAGNOSIS — N2581 Secondary hyperparathyroidism of renal origin: Secondary | ICD-10-CM | POA: Diagnosis not present

## 2020-02-01 DIAGNOSIS — N186 End stage renal disease: Secondary | ICD-10-CM | POA: Diagnosis not present

## 2020-02-03 DIAGNOSIS — N186 End stage renal disease: Secondary | ICD-10-CM | POA: Diagnosis not present

## 2020-02-03 DIAGNOSIS — Z992 Dependence on renal dialysis: Secondary | ICD-10-CM | POA: Diagnosis not present

## 2020-02-03 DIAGNOSIS — N2581 Secondary hyperparathyroidism of renal origin: Secondary | ICD-10-CM | POA: Diagnosis not present

## 2020-02-05 DIAGNOSIS — N2581 Secondary hyperparathyroidism of renal origin: Secondary | ICD-10-CM | POA: Diagnosis not present

## 2020-02-05 DIAGNOSIS — Z992 Dependence on renal dialysis: Secondary | ICD-10-CM | POA: Diagnosis not present

## 2020-02-05 DIAGNOSIS — N186 End stage renal disease: Secondary | ICD-10-CM | POA: Diagnosis not present

## 2020-02-08 DIAGNOSIS — N2581 Secondary hyperparathyroidism of renal origin: Secondary | ICD-10-CM | POA: Diagnosis not present

## 2020-02-08 DIAGNOSIS — N186 End stage renal disease: Secondary | ICD-10-CM | POA: Diagnosis not present

## 2020-02-08 DIAGNOSIS — Z992 Dependence on renal dialysis: Secondary | ICD-10-CM | POA: Diagnosis not present

## 2020-02-10 ENCOUNTER — Other Ambulatory Visit: Payer: Self-pay | Admitting: Gastroenterology

## 2020-02-10 DIAGNOSIS — Z992 Dependence on renal dialysis: Secondary | ICD-10-CM | POA: Diagnosis not present

## 2020-02-10 DIAGNOSIS — N2581 Secondary hyperparathyroidism of renal origin: Secondary | ICD-10-CM | POA: Diagnosis not present

## 2020-02-10 DIAGNOSIS — N186 End stage renal disease: Secondary | ICD-10-CM | POA: Diagnosis not present

## 2020-02-10 NOTE — Telephone Encounter (Signed)
I will refill this patient's pantoprazole.  He needs a mid to late March office appointment with me.  - HD

## 2020-02-10 NOTE — Telephone Encounter (Signed)
Follow up scheduled for 04-05-2020 @ 920am

## 2020-02-12 DIAGNOSIS — N2581 Secondary hyperparathyroidism of renal origin: Secondary | ICD-10-CM | POA: Diagnosis not present

## 2020-02-12 DIAGNOSIS — Z992 Dependence on renal dialysis: Secondary | ICD-10-CM | POA: Diagnosis not present

## 2020-02-12 DIAGNOSIS — N186 End stage renal disease: Secondary | ICD-10-CM | POA: Diagnosis not present

## 2020-02-15 DIAGNOSIS — Z992 Dependence on renal dialysis: Secondary | ICD-10-CM | POA: Diagnosis not present

## 2020-02-15 DIAGNOSIS — N186 End stage renal disease: Secondary | ICD-10-CM | POA: Diagnosis not present

## 2020-02-15 DIAGNOSIS — N2581 Secondary hyperparathyroidism of renal origin: Secondary | ICD-10-CM | POA: Diagnosis not present

## 2020-02-15 DIAGNOSIS — E1122 Type 2 diabetes mellitus with diabetic chronic kidney disease: Secondary | ICD-10-CM | POA: Diagnosis not present

## 2020-02-17 DIAGNOSIS — Z992 Dependence on renal dialysis: Secondary | ICD-10-CM | POA: Diagnosis not present

## 2020-02-17 DIAGNOSIS — N186 End stage renal disease: Secondary | ICD-10-CM | POA: Diagnosis not present

## 2020-02-17 DIAGNOSIS — N2581 Secondary hyperparathyroidism of renal origin: Secondary | ICD-10-CM | POA: Diagnosis not present

## 2020-02-19 DIAGNOSIS — N2581 Secondary hyperparathyroidism of renal origin: Secondary | ICD-10-CM | POA: Diagnosis not present

## 2020-02-19 DIAGNOSIS — Z992 Dependence on renal dialysis: Secondary | ICD-10-CM | POA: Diagnosis not present

## 2020-02-19 DIAGNOSIS — N186 End stage renal disease: Secondary | ICD-10-CM | POA: Diagnosis not present

## 2020-02-22 DIAGNOSIS — Z992 Dependence on renal dialysis: Secondary | ICD-10-CM | POA: Diagnosis not present

## 2020-02-22 DIAGNOSIS — N2581 Secondary hyperparathyroidism of renal origin: Secondary | ICD-10-CM | POA: Diagnosis not present

## 2020-02-22 DIAGNOSIS — N186 End stage renal disease: Secondary | ICD-10-CM | POA: Diagnosis not present

## 2020-02-24 DIAGNOSIS — N186 End stage renal disease: Secondary | ICD-10-CM | POA: Diagnosis not present

## 2020-02-24 DIAGNOSIS — Z992 Dependence on renal dialysis: Secondary | ICD-10-CM | POA: Diagnosis not present

## 2020-02-24 DIAGNOSIS — N2581 Secondary hyperparathyroidism of renal origin: Secondary | ICD-10-CM | POA: Diagnosis not present

## 2020-02-26 DIAGNOSIS — N2581 Secondary hyperparathyroidism of renal origin: Secondary | ICD-10-CM | POA: Diagnosis not present

## 2020-02-26 DIAGNOSIS — N186 End stage renal disease: Secondary | ICD-10-CM | POA: Diagnosis not present

## 2020-02-26 DIAGNOSIS — Z992 Dependence on renal dialysis: Secondary | ICD-10-CM | POA: Diagnosis not present

## 2020-02-29 DIAGNOSIS — N186 End stage renal disease: Secondary | ICD-10-CM | POA: Diagnosis not present

## 2020-02-29 DIAGNOSIS — Z992 Dependence on renal dialysis: Secondary | ICD-10-CM | POA: Diagnosis not present

## 2020-02-29 DIAGNOSIS — N2581 Secondary hyperparathyroidism of renal origin: Secondary | ICD-10-CM | POA: Diagnosis not present

## 2020-03-02 DIAGNOSIS — N186 End stage renal disease: Secondary | ICD-10-CM | POA: Diagnosis not present

## 2020-03-02 DIAGNOSIS — Z992 Dependence on renal dialysis: Secondary | ICD-10-CM | POA: Diagnosis not present

## 2020-03-02 DIAGNOSIS — N2581 Secondary hyperparathyroidism of renal origin: Secondary | ICD-10-CM | POA: Diagnosis not present

## 2020-03-04 DIAGNOSIS — Z992 Dependence on renal dialysis: Secondary | ICD-10-CM | POA: Diagnosis not present

## 2020-03-04 DIAGNOSIS — N2581 Secondary hyperparathyroidism of renal origin: Secondary | ICD-10-CM | POA: Diagnosis not present

## 2020-03-04 DIAGNOSIS — N186 End stage renal disease: Secondary | ICD-10-CM | POA: Diagnosis not present

## 2020-03-07 DIAGNOSIS — Z992 Dependence on renal dialysis: Secondary | ICD-10-CM | POA: Diagnosis not present

## 2020-03-07 DIAGNOSIS — N186 End stage renal disease: Secondary | ICD-10-CM | POA: Diagnosis not present

## 2020-03-07 DIAGNOSIS — N2581 Secondary hyperparathyroidism of renal origin: Secondary | ICD-10-CM | POA: Diagnosis not present

## 2020-03-09 DIAGNOSIS — N2581 Secondary hyperparathyroidism of renal origin: Secondary | ICD-10-CM | POA: Diagnosis not present

## 2020-03-09 DIAGNOSIS — N186 End stage renal disease: Secondary | ICD-10-CM | POA: Diagnosis not present

## 2020-03-09 DIAGNOSIS — Z992 Dependence on renal dialysis: Secondary | ICD-10-CM | POA: Diagnosis not present

## 2020-03-11 DIAGNOSIS — Z992 Dependence on renal dialysis: Secondary | ICD-10-CM | POA: Diagnosis not present

## 2020-03-11 DIAGNOSIS — N186 End stage renal disease: Secondary | ICD-10-CM | POA: Diagnosis not present

## 2020-03-11 DIAGNOSIS — N2581 Secondary hyperparathyroidism of renal origin: Secondary | ICD-10-CM | POA: Diagnosis not present

## 2020-03-14 DIAGNOSIS — N2581 Secondary hyperparathyroidism of renal origin: Secondary | ICD-10-CM | POA: Diagnosis not present

## 2020-03-14 DIAGNOSIS — E1122 Type 2 diabetes mellitus with diabetic chronic kidney disease: Secondary | ICD-10-CM | POA: Diagnosis not present

## 2020-03-14 DIAGNOSIS — N186 End stage renal disease: Secondary | ICD-10-CM | POA: Diagnosis not present

## 2020-03-14 DIAGNOSIS — Z992 Dependence on renal dialysis: Secondary | ICD-10-CM | POA: Diagnosis not present

## 2020-03-16 DIAGNOSIS — Z992 Dependence on renal dialysis: Secondary | ICD-10-CM | POA: Diagnosis not present

## 2020-03-16 DIAGNOSIS — N2581 Secondary hyperparathyroidism of renal origin: Secondary | ICD-10-CM | POA: Diagnosis not present

## 2020-03-16 DIAGNOSIS — N186 End stage renal disease: Secondary | ICD-10-CM | POA: Diagnosis not present

## 2020-03-18 DIAGNOSIS — N186 End stage renal disease: Secondary | ICD-10-CM | POA: Diagnosis not present

## 2020-03-18 DIAGNOSIS — N2581 Secondary hyperparathyroidism of renal origin: Secondary | ICD-10-CM | POA: Diagnosis not present

## 2020-03-18 DIAGNOSIS — Z992 Dependence on renal dialysis: Secondary | ICD-10-CM | POA: Diagnosis not present

## 2020-03-21 DIAGNOSIS — N186 End stage renal disease: Secondary | ICD-10-CM | POA: Diagnosis not present

## 2020-03-21 DIAGNOSIS — Z992 Dependence on renal dialysis: Secondary | ICD-10-CM | POA: Diagnosis not present

## 2020-03-21 DIAGNOSIS — N2581 Secondary hyperparathyroidism of renal origin: Secondary | ICD-10-CM | POA: Diagnosis not present

## 2020-03-23 DIAGNOSIS — Z992 Dependence on renal dialysis: Secondary | ICD-10-CM | POA: Diagnosis not present

## 2020-03-23 DIAGNOSIS — N186 End stage renal disease: Secondary | ICD-10-CM | POA: Diagnosis not present

## 2020-03-23 DIAGNOSIS — N2581 Secondary hyperparathyroidism of renal origin: Secondary | ICD-10-CM | POA: Diagnosis not present

## 2020-03-25 DIAGNOSIS — N2581 Secondary hyperparathyroidism of renal origin: Secondary | ICD-10-CM | POA: Diagnosis not present

## 2020-03-25 DIAGNOSIS — N186 End stage renal disease: Secondary | ICD-10-CM | POA: Diagnosis not present

## 2020-03-25 DIAGNOSIS — Z992 Dependence on renal dialysis: Secondary | ICD-10-CM | POA: Diagnosis not present

## 2020-03-28 DIAGNOSIS — Z992 Dependence on renal dialysis: Secondary | ICD-10-CM | POA: Diagnosis not present

## 2020-03-28 DIAGNOSIS — N186 End stage renal disease: Secondary | ICD-10-CM | POA: Diagnosis not present

## 2020-03-28 DIAGNOSIS — N2581 Secondary hyperparathyroidism of renal origin: Secondary | ICD-10-CM | POA: Diagnosis not present

## 2020-03-30 DIAGNOSIS — Z992 Dependence on renal dialysis: Secondary | ICD-10-CM | POA: Diagnosis not present

## 2020-03-30 DIAGNOSIS — N2581 Secondary hyperparathyroidism of renal origin: Secondary | ICD-10-CM | POA: Diagnosis not present

## 2020-03-30 DIAGNOSIS — N186 End stage renal disease: Secondary | ICD-10-CM | POA: Diagnosis not present

## 2020-04-01 DIAGNOSIS — N2581 Secondary hyperparathyroidism of renal origin: Secondary | ICD-10-CM | POA: Diagnosis not present

## 2020-04-01 DIAGNOSIS — Z992 Dependence on renal dialysis: Secondary | ICD-10-CM | POA: Diagnosis not present

## 2020-04-01 DIAGNOSIS — N186 End stage renal disease: Secondary | ICD-10-CM | POA: Diagnosis not present

## 2020-04-04 DIAGNOSIS — Z992 Dependence on renal dialysis: Secondary | ICD-10-CM | POA: Diagnosis not present

## 2020-04-04 DIAGNOSIS — N2581 Secondary hyperparathyroidism of renal origin: Secondary | ICD-10-CM | POA: Diagnosis not present

## 2020-04-04 DIAGNOSIS — N186 End stage renal disease: Secondary | ICD-10-CM | POA: Diagnosis not present

## 2020-04-05 ENCOUNTER — Ambulatory Visit: Payer: BC Managed Care – PPO | Admitting: Gastroenterology

## 2020-04-06 DIAGNOSIS — N2581 Secondary hyperparathyroidism of renal origin: Secondary | ICD-10-CM | POA: Diagnosis not present

## 2020-04-06 DIAGNOSIS — Z992 Dependence on renal dialysis: Secondary | ICD-10-CM | POA: Diagnosis not present

## 2020-04-06 DIAGNOSIS — N186 End stage renal disease: Secondary | ICD-10-CM | POA: Diagnosis not present

## 2020-04-08 DIAGNOSIS — N2581 Secondary hyperparathyroidism of renal origin: Secondary | ICD-10-CM | POA: Diagnosis not present

## 2020-04-08 DIAGNOSIS — N186 End stage renal disease: Secondary | ICD-10-CM | POA: Diagnosis not present

## 2020-04-08 DIAGNOSIS — Z992 Dependence on renal dialysis: Secondary | ICD-10-CM | POA: Diagnosis not present

## 2020-04-11 DIAGNOSIS — N2581 Secondary hyperparathyroidism of renal origin: Secondary | ICD-10-CM | POA: Diagnosis not present

## 2020-04-11 DIAGNOSIS — Z992 Dependence on renal dialysis: Secondary | ICD-10-CM | POA: Diagnosis not present

## 2020-04-11 DIAGNOSIS — N186 End stage renal disease: Secondary | ICD-10-CM | POA: Diagnosis not present

## 2020-04-13 DIAGNOSIS — Z992 Dependence on renal dialysis: Secondary | ICD-10-CM | POA: Diagnosis not present

## 2020-04-13 DIAGNOSIS — N186 End stage renal disease: Secondary | ICD-10-CM | POA: Diagnosis not present

## 2020-04-13 DIAGNOSIS — N2581 Secondary hyperparathyroidism of renal origin: Secondary | ICD-10-CM | POA: Diagnosis not present

## 2020-04-14 DIAGNOSIS — Z992 Dependence on renal dialysis: Secondary | ICD-10-CM | POA: Diagnosis not present

## 2020-04-14 DIAGNOSIS — N186 End stage renal disease: Secondary | ICD-10-CM | POA: Diagnosis not present

## 2020-04-14 DIAGNOSIS — E1122 Type 2 diabetes mellitus with diabetic chronic kidney disease: Secondary | ICD-10-CM | POA: Diagnosis not present

## 2020-04-15 DIAGNOSIS — N2581 Secondary hyperparathyroidism of renal origin: Secondary | ICD-10-CM | POA: Diagnosis not present

## 2020-04-15 DIAGNOSIS — N186 End stage renal disease: Secondary | ICD-10-CM | POA: Diagnosis not present

## 2020-04-15 DIAGNOSIS — Z992 Dependence on renal dialysis: Secondary | ICD-10-CM | POA: Diagnosis not present

## 2020-04-17 ENCOUNTER — Other Ambulatory Visit: Payer: Self-pay | Admitting: Gastroenterology

## 2020-04-18 DIAGNOSIS — N2581 Secondary hyperparathyroidism of renal origin: Secondary | ICD-10-CM | POA: Diagnosis not present

## 2020-04-18 DIAGNOSIS — Z992 Dependence on renal dialysis: Secondary | ICD-10-CM | POA: Diagnosis not present

## 2020-04-18 DIAGNOSIS — N186 End stage renal disease: Secondary | ICD-10-CM | POA: Diagnosis not present

## 2020-04-20 DIAGNOSIS — N2581 Secondary hyperparathyroidism of renal origin: Secondary | ICD-10-CM | POA: Diagnosis not present

## 2020-04-20 DIAGNOSIS — N186 End stage renal disease: Secondary | ICD-10-CM | POA: Diagnosis not present

## 2020-04-20 DIAGNOSIS — Z992 Dependence on renal dialysis: Secondary | ICD-10-CM | POA: Diagnosis not present

## 2020-04-22 DIAGNOSIS — N186 End stage renal disease: Secondary | ICD-10-CM | POA: Diagnosis not present

## 2020-04-22 DIAGNOSIS — N2581 Secondary hyperparathyroidism of renal origin: Secondary | ICD-10-CM | POA: Diagnosis not present

## 2020-04-22 DIAGNOSIS — Z992 Dependence on renal dialysis: Secondary | ICD-10-CM | POA: Diagnosis not present

## 2020-04-25 DIAGNOSIS — Z992 Dependence on renal dialysis: Secondary | ICD-10-CM | POA: Diagnosis not present

## 2020-04-25 DIAGNOSIS — N2581 Secondary hyperparathyroidism of renal origin: Secondary | ICD-10-CM | POA: Diagnosis not present

## 2020-04-25 DIAGNOSIS — N186 End stage renal disease: Secondary | ICD-10-CM | POA: Diagnosis not present

## 2020-04-27 DIAGNOSIS — Z992 Dependence on renal dialysis: Secondary | ICD-10-CM | POA: Diagnosis not present

## 2020-04-27 DIAGNOSIS — N186 End stage renal disease: Secondary | ICD-10-CM | POA: Diagnosis not present

## 2020-04-27 DIAGNOSIS — N2581 Secondary hyperparathyroidism of renal origin: Secondary | ICD-10-CM | POA: Diagnosis not present

## 2020-04-29 DIAGNOSIS — N186 End stage renal disease: Secondary | ICD-10-CM | POA: Diagnosis not present

## 2020-04-29 DIAGNOSIS — N2581 Secondary hyperparathyroidism of renal origin: Secondary | ICD-10-CM | POA: Diagnosis not present

## 2020-04-29 DIAGNOSIS — Z992 Dependence on renal dialysis: Secondary | ICD-10-CM | POA: Diagnosis not present

## 2020-05-02 DIAGNOSIS — Z992 Dependence on renal dialysis: Secondary | ICD-10-CM | POA: Diagnosis not present

## 2020-05-02 DIAGNOSIS — N186 End stage renal disease: Secondary | ICD-10-CM | POA: Diagnosis not present

## 2020-05-02 DIAGNOSIS — N2581 Secondary hyperparathyroidism of renal origin: Secondary | ICD-10-CM | POA: Diagnosis not present

## 2020-05-04 DIAGNOSIS — N186 End stage renal disease: Secondary | ICD-10-CM | POA: Diagnosis not present

## 2020-05-04 DIAGNOSIS — N2581 Secondary hyperparathyroidism of renal origin: Secondary | ICD-10-CM | POA: Diagnosis not present

## 2020-05-04 DIAGNOSIS — Z992 Dependence on renal dialysis: Secondary | ICD-10-CM | POA: Diagnosis not present

## 2020-05-04 DIAGNOSIS — M1731 Unilateral post-traumatic osteoarthritis, right knee: Secondary | ICD-10-CM | POA: Diagnosis not present

## 2020-05-06 ENCOUNTER — Other Ambulatory Visit: Payer: Self-pay | Admitting: Orthopedic Surgery

## 2020-05-06 DIAGNOSIS — M25561 Pain in right knee: Secondary | ICD-10-CM

## 2020-05-06 DIAGNOSIS — Z992 Dependence on renal dialysis: Secondary | ICD-10-CM | POA: Diagnosis not present

## 2020-05-06 DIAGNOSIS — N186 End stage renal disease: Secondary | ICD-10-CM | POA: Diagnosis not present

## 2020-05-06 DIAGNOSIS — N2581 Secondary hyperparathyroidism of renal origin: Secondary | ICD-10-CM | POA: Diagnosis not present

## 2020-05-09 DIAGNOSIS — N186 End stage renal disease: Secondary | ICD-10-CM | POA: Diagnosis not present

## 2020-05-09 DIAGNOSIS — N2581 Secondary hyperparathyroidism of renal origin: Secondary | ICD-10-CM | POA: Diagnosis not present

## 2020-05-09 DIAGNOSIS — Z992 Dependence on renal dialysis: Secondary | ICD-10-CM | POA: Diagnosis not present

## 2020-05-11 DIAGNOSIS — Z992 Dependence on renal dialysis: Secondary | ICD-10-CM | POA: Diagnosis not present

## 2020-05-11 DIAGNOSIS — N2581 Secondary hyperparathyroidism of renal origin: Secondary | ICD-10-CM | POA: Diagnosis not present

## 2020-05-11 DIAGNOSIS — N186 End stage renal disease: Secondary | ICD-10-CM | POA: Diagnosis not present

## 2020-05-12 ENCOUNTER — Other Ambulatory Visit: Payer: Self-pay | Admitting: Gastroenterology

## 2020-05-13 DIAGNOSIS — Z992 Dependence on renal dialysis: Secondary | ICD-10-CM | POA: Diagnosis not present

## 2020-05-13 DIAGNOSIS — N186 End stage renal disease: Secondary | ICD-10-CM | POA: Diagnosis not present

## 2020-05-13 DIAGNOSIS — N2581 Secondary hyperparathyroidism of renal origin: Secondary | ICD-10-CM | POA: Diagnosis not present

## 2020-05-14 ENCOUNTER — Other Ambulatory Visit: Payer: Self-pay

## 2020-05-14 ENCOUNTER — Other Ambulatory Visit: Payer: BC Managed Care – PPO

## 2020-05-14 ENCOUNTER — Ambulatory Visit
Admission: RE | Admit: 2020-05-14 | Discharge: 2020-05-14 | Disposition: A | Discharge status: Home / Self Care | Payer: BC Managed Care – PPO | Source: Ambulatory Visit | Attending: Orthopedic Surgery | Admitting: Orthopedic Surgery

## 2020-05-14 DIAGNOSIS — M25461 Effusion, right knee: Secondary | ICD-10-CM | POA: Diagnosis not present

## 2020-05-14 DIAGNOSIS — I739 Peripheral vascular disease, unspecified: Secondary | ICD-10-CM | POA: Diagnosis not present

## 2020-05-14 DIAGNOSIS — E1122 Type 2 diabetes mellitus with diabetic chronic kidney disease: Secondary | ICD-10-CM | POA: Diagnosis not present

## 2020-05-14 DIAGNOSIS — S82141A Displaced bicondylar fracture of right tibia, initial encounter for closed fracture: Secondary | ICD-10-CM | POA: Diagnosis not present

## 2020-05-14 DIAGNOSIS — M25561 Pain in right knee: Secondary | ICD-10-CM

## 2020-05-14 DIAGNOSIS — F329 Major depressive disorder, single episode, unspecified: Secondary | ICD-10-CM | POA: Diagnosis not present

## 2020-05-14 DIAGNOSIS — Z992 Dependence on renal dialysis: Secondary | ICD-10-CM | POA: Diagnosis not present

## 2020-05-14 DIAGNOSIS — N186 End stage renal disease: Secondary | ICD-10-CM | POA: Diagnosis not present

## 2020-05-16 DIAGNOSIS — Z992 Dependence on renal dialysis: Secondary | ICD-10-CM | POA: Diagnosis not present

## 2020-05-16 DIAGNOSIS — N186 End stage renal disease: Secondary | ICD-10-CM | POA: Diagnosis not present

## 2020-05-16 DIAGNOSIS — N2581 Secondary hyperparathyroidism of renal origin: Secondary | ICD-10-CM | POA: Diagnosis not present

## 2020-05-17 DIAGNOSIS — Z125 Encounter for screening for malignant neoplasm of prostate: Secondary | ICD-10-CM | POA: Diagnosis not present

## 2020-05-17 DIAGNOSIS — Z0000 Encounter for general adult medical examination without abnormal findings: Secondary | ICD-10-CM | POA: Diagnosis not present

## 2020-05-17 DIAGNOSIS — E1129 Type 2 diabetes mellitus with other diabetic kidney complication: Secondary | ICD-10-CM | POA: Diagnosis not present

## 2020-05-17 DIAGNOSIS — I120 Hypertensive chronic kidney disease with stage 5 chronic kidney disease or end stage renal disease: Secondary | ICD-10-CM | POA: Diagnosis not present

## 2020-05-18 DIAGNOSIS — Z992 Dependence on renal dialysis: Secondary | ICD-10-CM | POA: Diagnosis not present

## 2020-05-18 DIAGNOSIS — N186 End stage renal disease: Secondary | ICD-10-CM | POA: Diagnosis not present

## 2020-05-18 DIAGNOSIS — N2581 Secondary hyperparathyroidism of renal origin: Secondary | ICD-10-CM | POA: Diagnosis not present

## 2020-05-20 ENCOUNTER — Other Ambulatory Visit: Payer: BC Managed Care – PPO

## 2020-05-20 DIAGNOSIS — Z0000 Encounter for general adult medical examination without abnormal findings: Secondary | ICD-10-CM | POA: Diagnosis not present

## 2020-05-20 DIAGNOSIS — Z1339 Encounter for screening examination for other mental health and behavioral disorders: Secondary | ICD-10-CM | POA: Diagnosis not present

## 2020-05-20 DIAGNOSIS — Z992 Dependence on renal dialysis: Secondary | ICD-10-CM | POA: Diagnosis not present

## 2020-05-20 DIAGNOSIS — N2581 Secondary hyperparathyroidism of renal origin: Secondary | ICD-10-CM | POA: Diagnosis not present

## 2020-05-20 DIAGNOSIS — N186 End stage renal disease: Secondary | ICD-10-CM | POA: Diagnosis not present

## 2020-05-20 DIAGNOSIS — E1129 Type 2 diabetes mellitus with other diabetic kidney complication: Secondary | ICD-10-CM | POA: Diagnosis not present

## 2020-05-20 DIAGNOSIS — Z1331 Encounter for screening for depression: Secondary | ICD-10-CM | POA: Diagnosis not present

## 2020-05-23 DIAGNOSIS — N186 End stage renal disease: Secondary | ICD-10-CM | POA: Diagnosis not present

## 2020-05-23 DIAGNOSIS — Z992 Dependence on renal dialysis: Secondary | ICD-10-CM | POA: Diagnosis not present

## 2020-05-23 DIAGNOSIS — N2581 Secondary hyperparathyroidism of renal origin: Secondary | ICD-10-CM | POA: Diagnosis not present

## 2020-05-25 DIAGNOSIS — N186 End stage renal disease: Secondary | ICD-10-CM | POA: Diagnosis not present

## 2020-05-25 DIAGNOSIS — Z992 Dependence on renal dialysis: Secondary | ICD-10-CM | POA: Diagnosis not present

## 2020-05-25 DIAGNOSIS — N2581 Secondary hyperparathyroidism of renal origin: Secondary | ICD-10-CM | POA: Diagnosis not present

## 2020-05-25 DIAGNOSIS — M1731 Unilateral post-traumatic osteoarthritis, right knee: Secondary | ICD-10-CM | POA: Diagnosis not present

## 2020-05-27 DIAGNOSIS — Z992 Dependence on renal dialysis: Secondary | ICD-10-CM | POA: Diagnosis not present

## 2020-05-27 DIAGNOSIS — N2581 Secondary hyperparathyroidism of renal origin: Secondary | ICD-10-CM | POA: Diagnosis not present

## 2020-05-27 DIAGNOSIS — N186 End stage renal disease: Secondary | ICD-10-CM | POA: Diagnosis not present

## 2020-05-30 DIAGNOSIS — N2581 Secondary hyperparathyroidism of renal origin: Secondary | ICD-10-CM | POA: Diagnosis not present

## 2020-05-30 DIAGNOSIS — N186 End stage renal disease: Secondary | ICD-10-CM | POA: Diagnosis not present

## 2020-05-30 DIAGNOSIS — Z992 Dependence on renal dialysis: Secondary | ICD-10-CM | POA: Diagnosis not present

## 2020-06-01 DIAGNOSIS — Z992 Dependence on renal dialysis: Secondary | ICD-10-CM | POA: Diagnosis not present

## 2020-06-01 DIAGNOSIS — N2581 Secondary hyperparathyroidism of renal origin: Secondary | ICD-10-CM | POA: Diagnosis not present

## 2020-06-01 DIAGNOSIS — N186 End stage renal disease: Secondary | ICD-10-CM | POA: Diagnosis not present

## 2020-06-03 DIAGNOSIS — N186 End stage renal disease: Secondary | ICD-10-CM | POA: Diagnosis not present

## 2020-06-03 DIAGNOSIS — N2581 Secondary hyperparathyroidism of renal origin: Secondary | ICD-10-CM | POA: Diagnosis not present

## 2020-06-03 DIAGNOSIS — Z992 Dependence on renal dialysis: Secondary | ICD-10-CM | POA: Diagnosis not present

## 2020-06-06 DIAGNOSIS — Z992 Dependence on renal dialysis: Secondary | ICD-10-CM | POA: Diagnosis not present

## 2020-06-06 DIAGNOSIS — N2581 Secondary hyperparathyroidism of renal origin: Secondary | ICD-10-CM | POA: Diagnosis not present

## 2020-06-06 DIAGNOSIS — N186 End stage renal disease: Secondary | ICD-10-CM | POA: Diagnosis not present

## 2020-06-08 DIAGNOSIS — N186 End stage renal disease: Secondary | ICD-10-CM | POA: Diagnosis not present

## 2020-06-08 DIAGNOSIS — N2581 Secondary hyperparathyroidism of renal origin: Secondary | ICD-10-CM | POA: Diagnosis not present

## 2020-06-08 DIAGNOSIS — Z992 Dependence on renal dialysis: Secondary | ICD-10-CM | POA: Diagnosis not present

## 2020-06-10 DIAGNOSIS — N186 End stage renal disease: Secondary | ICD-10-CM | POA: Diagnosis not present

## 2020-06-10 DIAGNOSIS — Z992 Dependence on renal dialysis: Secondary | ICD-10-CM | POA: Diagnosis not present

## 2020-06-10 DIAGNOSIS — N2581 Secondary hyperparathyroidism of renal origin: Secondary | ICD-10-CM | POA: Diagnosis not present

## 2020-06-13 DIAGNOSIS — N186 End stage renal disease: Secondary | ICD-10-CM | POA: Diagnosis not present

## 2020-06-13 DIAGNOSIS — N2581 Secondary hyperparathyroidism of renal origin: Secondary | ICD-10-CM | POA: Diagnosis not present

## 2020-06-13 DIAGNOSIS — Z992 Dependence on renal dialysis: Secondary | ICD-10-CM | POA: Diagnosis not present

## 2020-06-14 DIAGNOSIS — E1122 Type 2 diabetes mellitus with diabetic chronic kidney disease: Secondary | ICD-10-CM | POA: Diagnosis not present

## 2020-06-14 DIAGNOSIS — N186 End stage renal disease: Secondary | ICD-10-CM | POA: Diagnosis not present

## 2020-06-14 DIAGNOSIS — Z992 Dependence on renal dialysis: Secondary | ICD-10-CM | POA: Diagnosis not present

## 2020-06-15 DIAGNOSIS — N2581 Secondary hyperparathyroidism of renal origin: Secondary | ICD-10-CM | POA: Diagnosis not present

## 2020-06-15 DIAGNOSIS — N186 End stage renal disease: Secondary | ICD-10-CM | POA: Diagnosis not present

## 2020-06-15 DIAGNOSIS — Z992 Dependence on renal dialysis: Secondary | ICD-10-CM | POA: Diagnosis not present

## 2020-06-17 DIAGNOSIS — N2581 Secondary hyperparathyroidism of renal origin: Secondary | ICD-10-CM | POA: Diagnosis not present

## 2020-06-17 DIAGNOSIS — Z992 Dependence on renal dialysis: Secondary | ICD-10-CM | POA: Diagnosis not present

## 2020-06-17 DIAGNOSIS — N186 End stage renal disease: Secondary | ICD-10-CM | POA: Diagnosis not present

## 2020-06-20 DIAGNOSIS — N2581 Secondary hyperparathyroidism of renal origin: Secondary | ICD-10-CM | POA: Diagnosis not present

## 2020-06-20 DIAGNOSIS — N186 End stage renal disease: Secondary | ICD-10-CM | POA: Diagnosis not present

## 2020-06-20 DIAGNOSIS — Z992 Dependence on renal dialysis: Secondary | ICD-10-CM | POA: Diagnosis not present

## 2020-06-22 DIAGNOSIS — N2581 Secondary hyperparathyroidism of renal origin: Secondary | ICD-10-CM | POA: Diagnosis not present

## 2020-06-22 DIAGNOSIS — N186 End stage renal disease: Secondary | ICD-10-CM | POA: Diagnosis not present

## 2020-06-22 DIAGNOSIS — Z992 Dependence on renal dialysis: Secondary | ICD-10-CM | POA: Diagnosis not present

## 2020-06-23 DIAGNOSIS — Z992 Dependence on renal dialysis: Secondary | ICD-10-CM | POA: Diagnosis not present

## 2020-06-23 DIAGNOSIS — T82858A Stenosis of vascular prosthetic devices, implants and grafts, initial encounter: Secondary | ICD-10-CM | POA: Diagnosis not present

## 2020-06-23 DIAGNOSIS — N186 End stage renal disease: Secondary | ICD-10-CM | POA: Diagnosis not present

## 2020-06-23 DIAGNOSIS — I871 Compression of vein: Secondary | ICD-10-CM | POA: Diagnosis not present

## 2020-06-24 DIAGNOSIS — N186 End stage renal disease: Secondary | ICD-10-CM | POA: Diagnosis not present

## 2020-06-24 DIAGNOSIS — N2581 Secondary hyperparathyroidism of renal origin: Secondary | ICD-10-CM | POA: Diagnosis not present

## 2020-06-24 DIAGNOSIS — Z992 Dependence on renal dialysis: Secondary | ICD-10-CM | POA: Diagnosis not present

## 2020-06-27 DIAGNOSIS — N186 End stage renal disease: Secondary | ICD-10-CM | POA: Diagnosis not present

## 2020-06-27 DIAGNOSIS — N2581 Secondary hyperparathyroidism of renal origin: Secondary | ICD-10-CM | POA: Diagnosis not present

## 2020-06-27 DIAGNOSIS — Z992 Dependence on renal dialysis: Secondary | ICD-10-CM | POA: Diagnosis not present

## 2020-06-29 DIAGNOSIS — N2581 Secondary hyperparathyroidism of renal origin: Secondary | ICD-10-CM | POA: Diagnosis not present

## 2020-06-29 DIAGNOSIS — N186 End stage renal disease: Secondary | ICD-10-CM | POA: Diagnosis not present

## 2020-06-29 DIAGNOSIS — Z992 Dependence on renal dialysis: Secondary | ICD-10-CM | POA: Diagnosis not present

## 2020-07-01 DIAGNOSIS — N186 End stage renal disease: Secondary | ICD-10-CM | POA: Diagnosis not present

## 2020-07-01 DIAGNOSIS — N2581 Secondary hyperparathyroidism of renal origin: Secondary | ICD-10-CM | POA: Diagnosis not present

## 2020-07-01 DIAGNOSIS — Z992 Dependence on renal dialysis: Secondary | ICD-10-CM | POA: Diagnosis not present

## 2020-07-04 DIAGNOSIS — N186 End stage renal disease: Secondary | ICD-10-CM | POA: Diagnosis not present

## 2020-07-04 DIAGNOSIS — N2581 Secondary hyperparathyroidism of renal origin: Secondary | ICD-10-CM | POA: Diagnosis not present

## 2020-07-04 DIAGNOSIS — Z992 Dependence on renal dialysis: Secondary | ICD-10-CM | POA: Diagnosis not present

## 2020-07-06 DIAGNOSIS — Z992 Dependence on renal dialysis: Secondary | ICD-10-CM | POA: Diagnosis not present

## 2020-07-06 DIAGNOSIS — N186 End stage renal disease: Secondary | ICD-10-CM | POA: Diagnosis not present

## 2020-07-06 DIAGNOSIS — N2581 Secondary hyperparathyroidism of renal origin: Secondary | ICD-10-CM | POA: Diagnosis not present

## 2020-07-08 DIAGNOSIS — N186 End stage renal disease: Secondary | ICD-10-CM | POA: Diagnosis not present

## 2020-07-08 DIAGNOSIS — N2581 Secondary hyperparathyroidism of renal origin: Secondary | ICD-10-CM | POA: Diagnosis not present

## 2020-07-08 DIAGNOSIS — Z992 Dependence on renal dialysis: Secondary | ICD-10-CM | POA: Diagnosis not present

## 2020-07-11 DIAGNOSIS — N2581 Secondary hyperparathyroidism of renal origin: Secondary | ICD-10-CM | POA: Diagnosis not present

## 2020-07-11 DIAGNOSIS — Z992 Dependence on renal dialysis: Secondary | ICD-10-CM | POA: Diagnosis not present

## 2020-07-11 DIAGNOSIS — N186 End stage renal disease: Secondary | ICD-10-CM | POA: Diagnosis not present

## 2020-07-13 DIAGNOSIS — N186 End stage renal disease: Secondary | ICD-10-CM | POA: Diagnosis not present

## 2020-07-13 DIAGNOSIS — N2581 Secondary hyperparathyroidism of renal origin: Secondary | ICD-10-CM | POA: Diagnosis not present

## 2020-07-13 DIAGNOSIS — Z992 Dependence on renal dialysis: Secondary | ICD-10-CM | POA: Diagnosis not present

## 2020-07-14 DIAGNOSIS — N186 End stage renal disease: Secondary | ICD-10-CM | POA: Diagnosis not present

## 2020-07-14 DIAGNOSIS — Z992 Dependence on renal dialysis: Secondary | ICD-10-CM | POA: Diagnosis not present

## 2020-07-14 DIAGNOSIS — E1122 Type 2 diabetes mellitus with diabetic chronic kidney disease: Secondary | ICD-10-CM | POA: Diagnosis not present

## 2020-07-15 DIAGNOSIS — N2581 Secondary hyperparathyroidism of renal origin: Secondary | ICD-10-CM | POA: Diagnosis not present

## 2020-07-15 DIAGNOSIS — Z992 Dependence on renal dialysis: Secondary | ICD-10-CM | POA: Diagnosis not present

## 2020-07-15 DIAGNOSIS — N186 End stage renal disease: Secondary | ICD-10-CM | POA: Diagnosis not present

## 2020-07-18 ENCOUNTER — Other Ambulatory Visit: Payer: Self-pay | Admitting: Gastroenterology

## 2020-07-18 DIAGNOSIS — N186 End stage renal disease: Secondary | ICD-10-CM | POA: Diagnosis not present

## 2020-07-18 DIAGNOSIS — Z992 Dependence on renal dialysis: Secondary | ICD-10-CM | POA: Diagnosis not present

## 2020-07-18 DIAGNOSIS — N2581 Secondary hyperparathyroidism of renal origin: Secondary | ICD-10-CM | POA: Diagnosis not present

## 2020-07-20 ENCOUNTER — Emergency Department (HOSPITAL_COMMUNITY)
Admission: EM | Admit: 2020-07-20 | Discharge: 2020-07-20 | Discharge status: Against Medical Advice | Payer: BC Managed Care – PPO

## 2020-07-20 ENCOUNTER — Ambulatory Visit (INDEPENDENT_AMBULATORY_CARE_PROVIDER_SITE_OTHER): Payer: BC Managed Care – PPO

## 2020-07-20 ENCOUNTER — Ambulatory Visit
Admission: EM | Admit: 2020-07-20 | Discharge: 2020-07-20 | Disposition: A | Discharge status: Home / Self Care | Payer: BC Managed Care – PPO | Attending: Internal Medicine | Admitting: Internal Medicine

## 2020-07-20 ENCOUNTER — Other Ambulatory Visit: Payer: Self-pay

## 2020-07-20 ENCOUNTER — Emergency Department (HOSPITAL_COMMUNITY)
Admission: EM | Admit: 2020-07-20 | Discharge: 2020-07-21 | Disposition: A | Discharge status: Home / Self Care | Payer: BC Managed Care – PPO | Attending: Emergency Medicine | Admitting: Emergency Medicine

## 2020-07-20 ENCOUNTER — Encounter (HOSPITAL_COMMUNITY): Payer: Self-pay | Admitting: Emergency Medicine

## 2020-07-20 DIAGNOSIS — R059 Cough, unspecified: Secondary | ICD-10-CM | POA: Diagnosis not present

## 2020-07-20 DIAGNOSIS — E1140 Type 2 diabetes mellitus with diabetic neuropathy, unspecified: Secondary | ICD-10-CM | POA: Diagnosis not present

## 2020-07-20 DIAGNOSIS — I120 Hypertensive chronic kidney disease with stage 5 chronic kidney disease or end stage renal disease: Secondary | ICD-10-CM | POA: Insufficient documentation

## 2020-07-20 DIAGNOSIS — N186 End stage renal disease: Secondary | ICD-10-CM | POA: Insufficient documentation

## 2020-07-20 DIAGNOSIS — J810 Acute pulmonary edema: Secondary | ICD-10-CM

## 2020-07-20 DIAGNOSIS — E1122 Type 2 diabetes mellitus with diabetic chronic kidney disease: Secondary | ICD-10-CM | POA: Diagnosis not present

## 2020-07-20 DIAGNOSIS — R0602 Shortness of breath: Secondary | ICD-10-CM | POA: Diagnosis not present

## 2020-07-20 DIAGNOSIS — E1110 Type 2 diabetes mellitus with ketoacidosis without coma: Secondary | ICD-10-CM | POA: Diagnosis not present

## 2020-07-20 DIAGNOSIS — R079 Chest pain, unspecified: Secondary | ICD-10-CM

## 2020-07-20 DIAGNOSIS — Z992 Dependence on renal dialysis: Secondary | ICD-10-CM | POA: Diagnosis not present

## 2020-07-20 DIAGNOSIS — Z794 Long term (current) use of insulin: Secondary | ICD-10-CM | POA: Diagnosis not present

## 2020-07-20 DIAGNOSIS — H1033 Unspecified acute conjunctivitis, bilateral: Secondary | ICD-10-CM | POA: Diagnosis not present

## 2020-07-20 DIAGNOSIS — N2581 Secondary hyperparathyroidism of renal origin: Secondary | ICD-10-CM | POA: Diagnosis not present

## 2020-07-20 DIAGNOSIS — J189 Pneumonia, unspecified organism: Secondary | ICD-10-CM | POA: Diagnosis not present

## 2020-07-20 DIAGNOSIS — J90 Pleural effusion, not elsewhere classified: Secondary | ICD-10-CM | POA: Diagnosis not present

## 2020-07-20 DIAGNOSIS — R0789 Other chest pain: Secondary | ICD-10-CM | POA: Diagnosis not present

## 2020-07-20 DIAGNOSIS — E875 Hyperkalemia: Secondary | ICD-10-CM | POA: Insufficient documentation

## 2020-07-20 LAB — CBC WITH DIFFERENTIAL/PLATELET
Abs Immature Granulocytes: 0.05 10*3/uL (ref 0.00–0.07)
Basophils Absolute: 0 10*3/uL (ref 0.0–0.1)
Basophils Relative: 0 %
Eosinophils Absolute: 0.1 10*3/uL (ref 0.0–0.5)
Eosinophils Relative: 2 %
HCT: 38.9 % — ABNORMAL LOW (ref 39.0–52.0)
Hemoglobin: 11.9 g/dL — ABNORMAL LOW (ref 13.0–17.0)
Immature Granulocytes: 1 %
Lymphocytes Relative: 13 %
Lymphs Abs: 1.1 10*3/uL (ref 0.7–4.0)
MCH: 28.1 pg (ref 26.0–34.0)
MCHC: 30.6 g/dL (ref 30.0–36.0)
MCV: 91.7 fL (ref 80.0–100.0)
Monocytes Absolute: 0.8 10*3/uL (ref 0.1–1.0)
Monocytes Relative: 10 %
Neutro Abs: 6.2 10*3/uL (ref 1.7–7.7)
Neutrophils Relative %: 74 %
Platelets: 206 10*3/uL (ref 150–400)
RBC: 4.24 MIL/uL (ref 4.22–5.81)
RDW: 16.3 % — ABNORMAL HIGH (ref 11.5–15.5)
WBC: 8.3 10*3/uL (ref 4.0–10.5)
nRBC: 0 % (ref 0.0–0.2)

## 2020-07-20 LAB — COMPREHENSIVE METABOLIC PANEL
ALT: 14 U/L (ref 0–44)
AST: 19 U/L (ref 15–41)
Albumin: 3.8 g/dL (ref 3.5–5.0)
Alkaline Phosphatase: 108 U/L (ref 38–126)
Anion gap: 14 (ref 5–15)
BUN: 28 mg/dL — ABNORMAL HIGH (ref 6–20)
CO2: 32 mmol/L (ref 22–32)
Calcium: 9.5 mg/dL (ref 8.9–10.3)
Chloride: 92 mmol/L — ABNORMAL LOW (ref 98–111)
Creatinine, Ser: 5.47 mg/dL — ABNORMAL HIGH (ref 0.61–1.24)
GFR, Estimated: 12 mL/min — ABNORMAL LOW (ref >60)
Glucose, Bld: 258 mg/dL — ABNORMAL HIGH (ref 70–99)
Potassium: 4.2 mmol/L (ref 3.5–5.1)
Sodium: 138 mmol/L (ref 135–145)
Total Bilirubin: 0.7 mg/dL (ref 0.3–1.2)
Total Protein: 7.4 g/dL (ref 6.5–8.1)

## 2020-07-20 LAB — BRAIN NATRIURETIC PEPTIDE: B Natriuretic Peptide: 568.4 pg/mL — ABNORMAL HIGH (ref 0.0–100.0)

## 2020-07-20 NOTE — ED Provider Notes (Signed)
Emergency Medicine Provider Triage Evaluation Note  Alex Morrison , a 50 y.o. male  was evaluated in triage.  Pt complains of  cough, congestion, and bilateral eye drainage x 1 week. Evaluated at [facility] and sent in for further evaluation along with treatment and low oxygen saturation. Temp of 99 on arrival   Review of Systems  Positive: Cough, nasal congestion, shortness of breath Negative: Chest pain, leg swelling  Physical Exam  BP (!) 171/83 (BP Location: Right Arm)   Pulse 87   Temp 99 F (37.2 C) (Oral)   Resp 18   SpO2 95%  Gen:   Awake, no distress   Resp:  Normal effort  MSK:   Moves extremities without difficulty  Other:    Medical Decision Making  Medically screening exam initiated at 9:24 PM.  Appropriate orders placed.  Alex Morrison was informed that the remainder of the evaluation will be completed by another provider, this initial triage assessment does not replace that evaluation, and the importance of remaining in the ED until their evaluation is complete.     Janeece Fitting, PA-C 07/20/20 2126    Lennice Sites, DO 07/20/20 2131

## 2020-07-20 NOTE — ED Triage Notes (Signed)
Pt present bilateral eye redness with swelling and blood clots underneath eye lids. Symptoms started Tuesday night. Pt also present coughing and chest tightness from coughing.

## 2020-07-20 NOTE — Discharge Instructions (Addendum)
Please go to the hospital as soon as you leave urgent care for further evaluation and treatment.

## 2020-07-20 NOTE — ED Triage Notes (Signed)
Pt presents with bilateral eye discharge for which he went to UC. He was also coughing so UC sent him here for potential pneumonia. They did not address his eyes.

## 2020-07-20 NOTE — ED Provider Notes (Signed)
[name]    CSN: [id number] Arrival date & time: 07/20/20  1437      History   Chief Complaint Chief Complaint  Patient presents with   Chest Pain   Eye Problem    HPI Alex Morrison is a 50 y.o. male.   Patient presents with 1 week history of cough and congestion.  Denies shortness of breath or chest pain.  Denies history of lung disease.  Although patient states that he does have a chronic cough that has been present for approximately 3 years that he has had work-up with several specialists.  Cough is worsened over the past week due to upper respiratory infection.  Denies fevers at home. Denies any sick contacts.  Patient has not taken any over-the-counter medications for cough or cold.  Received COVID-19 test at hemodialysis appointment this morning but patient has not received result.  Patient is also having swelling and redness of bilateral eyes with purulent drainage that started this morning when he woke up.  Patient states that swelling and redness has improved since this morning.  Denies any blurry vision or vision changes, pain, itchiness.  Denies any head trauma.  Pertinent history includes end-stage renal disease where he had hemodialysis this morning.  Patient states that his blood pressure is normally elevated but usually averages [id number] systolic.  Patient states that his normal oxygen is typically in the high 90s.   Chest Pain Eye Problem  Past Medical History:  Diagnosis Date   Abscess of buttock    Acute kidney injury ([place]) 09/27/2013   Depression    Diabetes mellitus without complication ([place])    Diabetic neuropathy ([place])    Dialysis patient ([place])    M,W,F   ESRD (end stage renal disease) ([place])    Glaucoma    HTN (hypertension) 08/08/2018   Obesity    Perforated appendix    Peritonitis   Psoriasis    Retinopathy     Patient Active Problem List   Diagnosis Date Noted   Tibial plateau fracture 03/06/2019   ESRD (end stage renal disease) ([place])  08/08/2018   HTN (hypertension) 08/08/2018   SBO (small bowel obstruction) ([place]) [id number]   Periumbilical hernia [id number]   Abdominal hernia as complication of peritoneal dialysis 08/08/2018   Psoriasis 09/29/2013   Acute gangrenous appendicitis with perforation and peritonitis 09/27/2013   Type 1 diabetes mellitus ([place]) 09/27/2013   Acute kidney injury ([place]) 09/27/2013   DM (diabetes mellitus) type 2, uncontrolled, with ketoacidosis ([place]) 07/17/2012   DKA (diabetic ketoacidosis) ([place]) 07/16/2012   Perirectal abscess 07/14/2012    Past Surgical History:  Procedure Laterality Date   ABSCESS      RECTAL    APPENDECTOMY     AV FISTULA PLACEMENT Left 01/13/2019   Procedure: LEFT ARM ARTERIOVENOUS (AV) FISTULA CREATION;  Surgeon: Elam Dutch, MD;  Location: [place];  Service: Vascular;  Laterality: Left;   CATARACT EXTRACTION     Ex lap with ileocecectomy  09/27/13   Dr. Brantley Stage   ILEOCECETOMY     INCISION AND DRAINAGE     INCISIONAL HERNIA REPAIR N/A 08/11/2018   Procedure: [place];  Surgeon: [name], MD;  Location: [place];  Service: General;  Laterality: N/A;   INSERTION OF MESH  08/11/2018   Procedure: INSERTION OF PHASIX MESH;  Surgeon: [name], MD;  Location: [place];  Service: General;;   insertion of PD catheter  12/2017   IR FLUORO GUIDE CV LINE RIGHT  08/08/2018   IR US GUIDE VASC ACCESS RIGHT  08/08/2018   LAPAROSCOPIC ABDOMINAL EXPLORATION N/A 09/27/2013   LAPAROSCOPIC ASSISTED VENTRAL HERNIA REPAIR  2019   LAPAROSCOPY N/A 09/27/2013   Procedure: LAPAROSCOPY DIAGNOSTIC;  Surgeon: Erroll Luna, MD;  Location: [place];  Service: General;  Laterality: N/A;   LAPAROTOMY  08/11/2018   Procedure: EXPLORATORY LAPAROTOMY;  Surgeon: [name], MD;  Location: [place];  Service: General;;   LYSIS OF ADHESION  08/11/2018   Procedure: LYSIS OF ADHESIONS X 30 MINUTES;  Surgeon:  [name], MD;  Location: [place];  Service: General;;   MINOR REMOVAL OF PERITONEAL DIALYSIS CATHETER N/A 01/06/2019   Procedure: REMOVAL OF PERITONEAL DIALYSIS CATHETER;  Surgeon: Kieth Brightly Arta Bruce, MD;  Location: [facility];  Service: General;  Laterality: N/A;   OPEN REDUCTION INTERNAL FIXATION (ORIF) TIBIA/FIBULA FRACTURE Right 03/07/2019   Procedure: OPEN REDUCTION INTERNAL FIXATION (ORIF) FIBULA FRACTURE;  Surgeon: Marchia Bond, MD;  Location: [place];  Service: Orthopedics;  Laterality: Right;   ORIF TIBIA PLATEAU Right 03/07/2019   Procedure: Open Reduction Internal Fixation (Orif) Tibial Plateau;  Surgeon: Marchia Bond, MD;  Location: [place];  Service: Orthopedics;  Laterality: Right;   [place] Medications    Prior to Admission medications   Medication Sig Start Date End Date Taking? Authorizing Provider  insulin aspart (NOVOLOG) 100 UNIT/ML FlexPen Inject 3-9 Units into the skin 3 (three) times daily with meals.     [provider]  insulin glargine (LANTUS) 100 UNIT/ML Solostar Pen Inject 10 Units into the skin at bedtime.     [provider]  metoCLOPramide (REGLAN) 5 MG tablet TAKE 1 TABLET BY MOUTH 3 TIMES DAILY BEFORE MEALS 07/18/20   Doran Stabler, MD  pantoprazole (PROTONIX) 40 MG tablet TAKE 1 TABLET BY MOUTH EVERY DAY 02/10/20   Doran Stabler, MD  sevelamer carbonate (RENVELA) 800 MG tablet Take 800-2,400 mg by mouth See admin instructions. Take 3 tablets (2400 mg) by mouth two times daily with meals, take 1-2 tablets [phone] mg) with snacks. 01/20/19   [provider]    Family History Family History  Problem Relation Age of Onset   Diabetes Mellitus II Mother    Heart disease Mother    Diabetes Mother    Diabetes Mellitus I Father    Diabetes Father    Diabetes Mellitus II Brother    Colon cancer Neg Hx    Esophageal cancer Neg Hx    Pancreatic cancer Neg Hx    Stomach cancer Neg Hx     Social  History Social History   Tobacco Use   Smoking status: Never   Smokeless tobacco: Never  Vaping Use   Vaping Use: Never used  Substance Use Topics   Alcohol use: No   Drug use: No     Allergies   Patient has no known allergies.   Review of Systems Review of Systems Per HPI  Physical Exam Triage Vital Signs ED Triage Vitals  Enc Vitals Group     BP 07/20/20 1605 (!) 173/82     Pulse Rate 07/20/20 1605 84     Resp 07/20/20 1605 18     Temp 07/20/20 1605 98.9 F (37.2 C)     Temp Source 07/20/20 1605 Oral     SpO2 07/20/20 1605 94 %     Weight --  Height --      Head Circumference --      Peak Flow --      Pain Score 07/20/20 1607 2     Pain Loc --      Pain Edu? --      Excl. in [place]? --    No data found.  Updated Vital Signs BP (!) 173/82 (BP Location: Left Arm)   Pulse 84   Temp 98.9 F (37.2 C) (Oral)   Resp 18   SpO2 94%   Visual Acuity Right Eye Distance:   Left Eye Distance:   Bilateral Distance:    Right Eye Near:   Left Eye Near:    Bilateral Near:     Physical Exam Constitutional:      General: He is not in acute distress.    Appearance: Normal appearance.  HENT:     Head: Normocephalic and atraumatic.     Right Ear: Tympanic membrane and ear canal normal.     Left Ear: Tympanic membrane and ear canal normal.     Nose: Rhinorrhea present.     Mouth/Throat:     Mouth: Mucous membranes are moist.     Pharynx: No posterior oropharyngeal erythema.  Eyes:     General:        Right eye: Discharge present.        Left eye: Discharge present.    Extraocular Movements: Extraocular movements intact.     Conjunctiva/sclera: Conjunctivae normal.     Right eye: Exudate present.     Left eye: Exudate present.     Pupils: Pupils are equal, round, and reactive to light.     Comments: Blue/erythematous hyperpigmentation to bilateral upper lids and lower lids.  Mild swelling noted.  Bilateral sclera is erythematous.  Cardiovascular:     Rate  and Rhythm: Normal rate and regular rhythm.     Pulses: Normal pulses.     Heart sounds: Normal heart sounds.  Pulmonary:     Effort: Pulmonary effort is normal. No respiratory distress.     Breath sounds: Normal breath sounds. No wheezing.  Abdominal:     General: Abdomen is flat. Bowel sounds are normal.     Palpations: Abdomen is soft.  Musculoskeletal:        General: Normal range of motion.     Cervical back: Normal range of motion.  Skin:    General: Skin is warm and dry.  Neurological:     General: No focal deficit present.     Mental Status: He is alert and oriented to person, place, and time. Mental status is at baseline.  Psychiatric:        Mood and Affect: Mood normal.        Behavior: Behavior normal.     UC Treatments / Results  Labs (all labs ordered are listed, but only abnormal results are displayed) Labs Reviewed - No data to display  EKG   Radiology DG Chest 2 View  Result Date: 07/20/2020 CLINICAL DATA:  Persistent cough.  Chest tightness. EXAM: CHEST - 2 VIEW COMPARISON:  02/24/2019 FINDINGS: Previous right-sided dialysis catheter is been removed.The cardiomediastinal contours are normal. There is mild peribronchial thickening. Small bilateral pleural effusions with trace fluid in the fissures. No confluent airspace disease. No acute osseous abnormalities are seen. IMPRESSION: Small bilateral pleural effusions and peribronchial thickening, suggesting pulmonary edema. Electronically Signed   By: Keith Rake M.D.   On: 07/20/2020 16:51    Procedures Procedures (including  critical care time)  Medications Ordered in UC Medications - No data to display  Initial Impression / Assessment and Plan / UC Course  I have reviewed the triage vital signs and the nursing notes.  Pertinent labs & imaging results that were available during my care of the patient were reviewed by me and considered in my medical decision making (see chart for details).     Chest  x-ray showing signs of community-acquired pneumonia along with low oxygen saturation.  Oxygen saturation was rechecked during physical exam and was consistent with original check at 93 to 94%.  Patient was advised that he should go to the hospital emergency department to be further evaluated and treated.  Patient was agreeable and states that he will leave urgent care and go to the hospital.  Appearance of eyes on physical exam was also worrisome due to color of hyperpigmentation.  Suspicious for conjunctivitis but hyperpigmentation is misleading.  Patient was also advised that eyes should be further evaluated and treated at hospital due to severity.  Patient was agreeable with plan.  Patient was stable at discharge and states that he had a friend that was able to take him to the hospital. Final Clinical Impressions(s) / UC Diagnoses   Final diagnoses:  Community acquired pneumonia, unspecified laterality  Cough  Acute bacterial conjunctivitis of both eyes     Discharge Instructions      Please go to the hospital as soon as you leave urgent care for further evaluation and treatment.      ED Prescriptions   None    PDMP not reviewed this encounter.   Odis Luster, [place] 07/20/20 [phone]

## 2020-07-21 DIAGNOSIS — H1033 Unspecified acute conjunctivitis, bilateral: Secondary | ICD-10-CM | POA: Diagnosis not present

## 2020-07-21 DIAGNOSIS — I120 Hypertensive chronic kidney disease with stage 5 chronic kidney disease or end stage renal disease: Secondary | ICD-10-CM | POA: Diagnosis not present

## 2020-07-21 DIAGNOSIS — N186 End stage renal disease: Secondary | ICD-10-CM | POA: Diagnosis not present

## 2020-07-21 MED ORDER — DOXYCYCLINE HYCLATE 100 MG PO CAPS: 100.0000 mg | ORAL_CAPSULE | Freq: Two times a day (BID) | ORAL | 0 refills | Status: DC

## 2020-07-21 MED ORDER — DOXYCYCLINE HYCLATE 100 MG PO TABS
100.0000 mg | ORAL_TABLET | Freq: Once | ORAL | Status: AC
  Administered 2020-07-21: 100 mg via ORAL
  Filled 2020-07-21: qty 1

## 2020-07-21 MED ORDER — POLYMYXIN B-TRIMETHOPRIM 10000-0.1 UNIT/ML-% OP SOLN
2.0000 [drp] | OPHTHALMIC | Status: DC
  Administered 2020-07-21: 2 [drp] via OPHTHALMIC
  Filled 2020-07-21: qty 10

## 2020-07-21 NOTE — ED Provider Notes (Signed)
[facility] DEPT Provider Note   CSN: [id number] Arrival date & time: 07/20/20  1841     History Chief Complaint  Patient presents with   Cough   Eye Drainage    Alex Morrison is a 50 y.o. male history of diabetes, ESRD on HD (last HD today), hypertension, here presenting with bilateral eye drainage.  Patient has been having pinkeye for the last week or so and noticed that his eyelids become more swollen for the last 2 to 3 days patient has some cough and congestion as well.  Patient finished his dialysis today and went to urgent care.  Urgent care was concerned about his chest x-ray showing pulmonary edema and sent in for further evaluation.  They noted that his oxygen is 93 to 94%.  Patient denies any fevers.  Patient does not wear contact lens.  The history is provided by the patient.      Past Medical History:  Diagnosis Date   Abscess of buttock    Acute kidney injury ([place]) 09/27/2013   Depression    Diabetes mellitus without complication ([place])    Diabetic neuropathy ([place])    Dialysis patient ([place])    M,W,F   ESRD (end stage renal disease) ([place])    Glaucoma    HTN (hypertension) 08/08/2018   Obesity    Perforated appendix    Peritonitis   Psoriasis    Retinopathy     Patient Active Problem List   Diagnosis Date Noted   Tibial plateau fracture 03/06/2019   ESRD (end stage renal disease) ([place]) 08/08/2018   HTN (hypertension) 08/08/2018   SBO (small bowel obstruction) ([place]) [id number]   Periumbilical hernia [id number]   Abdominal hernia as complication of peritoneal dialysis 08/08/2018   Psoriasis 09/29/2013   Acute gangrenous appendicitis with perforation and peritonitis 09/27/2013   Type 1 diabetes mellitus ([place]) 09/27/2013   Acute kidney injury ([place]) 09/27/2013   DM (diabetes mellitus) type 2, uncontrolled, with ketoacidosis ([place]) 07/17/2012   DKA (diabetic ketoacidosis) ([place]) 07/16/2012   Perirectal abscess 07/14/2012    Past  Surgical History:  Procedure Laterality Date   ABSCESS      RECTAL    APPENDECTOMY     AV FISTULA PLACEMENT Left 01/13/2019   Procedure: LEFT ARM ARTERIOVENOUS (AV) FISTULA CREATION;  Surgeon: Elam Dutch, MD;  Location: [facility];  Service: Vascular;  Laterality: Left;   CATARACT EXTRACTION     Ex lap with ileocecectomy  09/27/13   Dr. Brantley Stage   ILEOCECETOMY     INCISION AND DRAINAGE     INCISIONAL HERNIA REPAIR N/A 08/11/2018   Procedure: [place];  Surgeon: [name], MD;  Location: [facility];  Service: General;  Laterality: N/A;   INSERTION OF MESH  08/11/2018   Procedure: INSERTION OF PHASIX MESH;  Surgeon: [name], MD;  Location: [facility];  Service: General;;   insertion of PD catheter  12/2017   IR FLUORO GUIDE CV LINE RIGHT  08/08/2018   IR US GUIDE VASC ACCESS RIGHT  08/08/2018   LAPAROSCOPIC ABDOMINAL EXPLORATION N/A 09/27/2013   LAPAROSCOPIC ASSISTED VENTRAL HERNIA REPAIR  2019   LAPAROSCOPY N/A 09/27/2013   Procedure: LAPAROSCOPY DIAGNOSTIC;  Surgeon: Erroll Luna, MD;  Location: [facility];  Service: General;  Laterality: N/A;   LAPAROTOMY  08/11/2018   Procedure: EXPLORATORY LAPAROTOMY;  Surgeon: [name], MD;  Location: [facility];  Service: General;;   LYSIS OF  ADHESION  08/11/2018   Procedure: LYSIS OF ADHESIONS X 30 MINUTES;  Surgeon: [name], MD;  Location: [facility];  Service: General;;   MINOR REMOVAL OF PERITONEAL DIALYSIS CATHETER N/A 01/06/2019   Procedure: REMOVAL OF PERITONEAL DIALYSIS CATHETER;  Surgeon: Kieth Brightly Arta Bruce, MD;  Location: [facility];  Service: General;  Laterality: N/A;   OPEN REDUCTION INTERNAL FIXATION (ORIF) TIBIA/FIBULA FRACTURE Right 03/07/2019   Procedure: OPEN REDUCTION INTERNAL FIXATION (ORIF) FIBULA FRACTURE;  Surgeon: Marchia Bond, MD;  Location: [facility];  Service: Orthopedics;  Laterality: Right;   ORIF TIBIA PLATEAU Right 03/07/2019   Procedure:  Open Reduction Internal Fixation (Orif) Tibial Plateau;  Surgeon: Marchia Bond, MD;  Location: [facility];  Service: Orthopedics;  Laterality: Right;   SHOULDER SURGERY  1989       Family History  Problem Relation Age of Onset   Diabetes Mellitus II Mother    Heart disease Mother    Diabetes Mother    Diabetes Mellitus I Father    Diabetes Father    Diabetes Mellitus II Brother    Colon cancer Neg Hx    Esophageal cancer Neg Hx    Pancreatic cancer Neg Hx    Stomach cancer Neg Hx     Social History   Tobacco Use   Smoking status: Never   Smokeless tobacco: Never  Vaping Use   Vaping Use: Never used  Substance Use Topics   Alcohol use: No   Drug use: No    Home Medications Prior to Admission medications   Medication Sig Start Date End Date Taking? Authorizing Provider  insulin aspart (NOVOLOG) 100 UNIT/ML FlexPen Inject 3-9 Units into the skin 3 (three) times daily with meals.     [provider]  insulin glargine (LANTUS) 100 UNIT/ML Solostar Pen Inject 10 Units into the skin at bedtime.     [provider]  metoCLOPramide (REGLAN) 5 MG tablet TAKE 1 TABLET BY MOUTH 3 TIMES DAILY BEFORE MEALS 07/18/20   Doran Stabler, MD  pantoprazole (PROTONIX) 40 MG tablet TAKE 1 TABLET BY MOUTH EVERY DAY 02/10/20   Doran Stabler, MD  sevelamer carbonate (RENVELA) 800 MG tablet Take 800-2,400 mg by mouth See admin instructions. Take 3 tablets (2400 mg) by mouth two times daily with meals, take 1-2 tablets ([phone] mg) with snacks. 01/20/19   [provider]    Allergies    Patient has no known allergies.  Review of Systems   Review of Systems  Eyes:  Positive for discharge and redness.  Respiratory:  Positive for cough.   All other systems reviewed and are negative.  Physical Exam Updated Vital Signs BP (!) 171/83 (BP Location: Right Arm)   Pulse 87   Temp 99 F (37.2 C) (Oral)   Resp 18   SpO2 95%   Physical Exam Vitals and nursing note  reviewed.  Constitutional:      Comments: Approximately ill but not acutely ill.  HENT:     Head: Normocephalic.     Nose: Nose normal.     Mouth/Throat:     Mouth: Mucous membranes are moist.  Eyes:     Comments: Eyelids are swollen bilaterally.  There is bilateral conjunctivitis with some purulent discharge.  Extraocular movements are intact  Cardiovascular:     Rate and Rhythm: Normal rate and regular rhythm.     Pulses: Normal pulses.  Pulmonary:     Comments: Diminished bilaterally. Musculoskeletal:  General: Normal range of motion.     Cervical back: Normal range of motion and neck supple.  Skin:    General: Skin is warm.     Capillary Refill: Capillary refill takes less than 2 seconds.  Neurological:     General: No focal deficit present.     Mental Status: He is oriented to person, place, and time.  Psychiatric:        Mood and Affect: Mood normal.        Behavior: Behavior normal.    ED Results / Procedures / Treatments   Labs (all labs ordered are listed, but only abnormal results are displayed) Labs Reviewed  CBC WITH DIFFERENTIAL/PLATELET - Abnormal; Notable for the following components:      Result Value   Hemoglobin 11.9 (*)    HCT 38.9 (*)    RDW 16.3 (*)    All other components within normal limits  COMPREHENSIVE METABOLIC PANEL - Abnormal; Notable for the following components:   Chloride 92 (*)    Glucose, Bld 258 (*)    BUN 28 (*)    Creatinine, Ser 5.47 (*)    GFR, Estimated 12 (*)    All other components within normal limits  BRAIN NATRIURETIC PEPTIDE - Abnormal; Notable for the following components:   B Natriuretic Peptide 568.4 (*)    All other components within normal limits  URINALYSIS, ROUTINE W REFLEX MICROSCOPIC    EKG None  Radiology DG Chest 2 View  Result Date: 07/20/2020 CLINICAL DATA:  Persistent cough.  Chest tightness. EXAM: CHEST - 2 VIEW COMPARISON:  02/24/2019 FINDINGS: Previous right-sided dialysis catheter is been  removed.The cardiomediastinal contours are normal. There is mild peribronchial thickening. Small bilateral pleural effusions with trace fluid in the fissures. No confluent airspace disease. No acute osseous abnormalities are seen. IMPRESSION: Small bilateral pleural effusions and peribronchial thickening, suggesting pulmonary edema. Electronically Signed   By: Keith Rake M.D.   On: 07/20/2020 16:51    Procedures Procedures   Medications Ordered in ED Medications  trimethoprim-polymyxin b (POLYTRIM) ophthalmic solution 2 drop (has no administration in time range)  doxycycline (VIBRA-TABS) tablet 100 mg (has no administration in time range)    ED Course  I have reviewed the triage vital signs and the nursing notes.  Pertinent labs & imaging results that were available during my care of the patient were reviewed by me and considered in my medical decision making (see chart for details).    MDM Rules/Calculators/A&P                         Alex Morrison is a 50 y.o. male presenting with bilateral conjunctivitis.  Patient does have some mild cough as well.  I reviewed the records from urgent care and the chest x-ray showed pulmonary edema and not pneumonia.  Patient's white blood cell count is normal.  His creatinine is baseline and his potassium is 4.2.  Patient's BNP is 568 and patient does not urinate.  Patient is not on diuretics.  I think his shortness of breath is likely from volume overload and he will benefit from additional dialysis session tomorrow.  He does have evidence of bilateral conjunctivitis and I am concerned for possible bacterial conjunctivitis.  Will give Polytrim drops and doxycycline p.o. patient will need ophthalmology follow-up.   Final Clinical Impression(s) / ED Diagnoses Final diagnoses:  None    Rx / DC Orders ED Discharge Orders  None        Drenda Freeze, MD 07/21/20 [facility]and[facility]sky

## 2020-07-21 NOTE — Discharge Instructions (Addendum)
You need to use Polytrim every 3 hours for 5 days.  Take doxycycline twice daily for a week.  You have more fluid in your lungs so you needed extra dialysis session tomorrow.  Please also call the eye doctor tomorrow to get an appointment this week  Return to ER if you have worsening shortness of breath, fever, cough, worse eye swelling or blurry vision

## 2020-07-22 DIAGNOSIS — N2581 Secondary hyperparathyroidism of renal origin: Secondary | ICD-10-CM | POA: Diagnosis not present

## 2020-07-22 DIAGNOSIS — N186 End stage renal disease: Secondary | ICD-10-CM | POA: Diagnosis not present

## 2020-07-22 DIAGNOSIS — Z992 Dependence on renal dialysis: Secondary | ICD-10-CM | POA: Diagnosis not present

## 2020-07-25 ENCOUNTER — Telehealth: Payer: Self-pay | Admitting: *Deleted

## 2020-07-25 DIAGNOSIS — Z992 Dependence on renal dialysis: Secondary | ICD-10-CM | POA: Diagnosis not present

## 2020-07-25 DIAGNOSIS — N186 End stage renal disease: Secondary | ICD-10-CM | POA: Diagnosis not present

## 2020-07-25 DIAGNOSIS — N2581 Secondary hyperparathyroidism of renal origin: Secondary | ICD-10-CM | POA: Diagnosis not present

## 2020-07-26 DIAGNOSIS — E1129 Type 2 diabetes mellitus with other diabetic kidney complication: Secondary | ICD-10-CM | POA: Diagnosis not present

## 2020-07-26 DIAGNOSIS — J90 Pleural effusion, not elsewhere classified: Secondary | ICD-10-CM | POA: Diagnosis not present

## 2020-07-27 DIAGNOSIS — N2581 Secondary hyperparathyroidism of renal origin: Secondary | ICD-10-CM | POA: Diagnosis not present

## 2020-07-27 DIAGNOSIS — Z992 Dependence on renal dialysis: Secondary | ICD-10-CM | POA: Diagnosis not present

## 2020-07-27 DIAGNOSIS — N186 End stage renal disease: Secondary | ICD-10-CM | POA: Diagnosis not present

## 2020-07-29 DIAGNOSIS — N186 End stage renal disease: Secondary | ICD-10-CM | POA: Diagnosis not present

## 2020-07-29 DIAGNOSIS — N2581 Secondary hyperparathyroidism of renal origin: Secondary | ICD-10-CM | POA: Diagnosis not present

## 2020-07-29 DIAGNOSIS — Z992 Dependence on renal dialysis: Secondary | ICD-10-CM | POA: Diagnosis not present

## 2020-08-01 DIAGNOSIS — N2581 Secondary hyperparathyroidism of renal origin: Secondary | ICD-10-CM | POA: Diagnosis not present

## 2020-08-01 DIAGNOSIS — Z992 Dependence on renal dialysis: Secondary | ICD-10-CM | POA: Diagnosis not present

## 2020-08-01 DIAGNOSIS — N186 End stage renal disease: Secondary | ICD-10-CM | POA: Diagnosis not present

## 2020-08-03 DIAGNOSIS — N2581 Secondary hyperparathyroidism of renal origin: Secondary | ICD-10-CM | POA: Diagnosis not present

## 2020-08-03 DIAGNOSIS — Z992 Dependence on renal dialysis: Secondary | ICD-10-CM | POA: Diagnosis not present

## 2020-08-03 DIAGNOSIS — N186 End stage renal disease: Secondary | ICD-10-CM | POA: Diagnosis not present

## 2020-08-05 DIAGNOSIS — N2581 Secondary hyperparathyroidism of renal origin: Secondary | ICD-10-CM | POA: Diagnosis not present

## 2020-08-05 DIAGNOSIS — N186 End stage renal disease: Secondary | ICD-10-CM | POA: Diagnosis not present

## 2020-08-05 DIAGNOSIS — Z992 Dependence on renal dialysis: Secondary | ICD-10-CM | POA: Diagnosis not present

## 2020-08-08 DIAGNOSIS — N186 End stage renal disease: Secondary | ICD-10-CM | POA: Diagnosis not present

## 2020-08-08 DIAGNOSIS — Z992 Dependence on renal dialysis: Secondary | ICD-10-CM | POA: Diagnosis not present

## 2020-08-08 DIAGNOSIS — N2581 Secondary hyperparathyroidism of renal origin: Secondary | ICD-10-CM | POA: Diagnosis not present

## 2020-08-10 DIAGNOSIS — N2581 Secondary hyperparathyroidism of renal origin: Secondary | ICD-10-CM | POA: Diagnosis not present

## 2020-08-10 DIAGNOSIS — N186 End stage renal disease: Secondary | ICD-10-CM | POA: Diagnosis not present

## 2020-08-10 DIAGNOSIS — Z992 Dependence on renal dialysis: Secondary | ICD-10-CM | POA: Diagnosis not present

## 2020-08-12 DIAGNOSIS — Z992 Dependence on renal dialysis: Secondary | ICD-10-CM | POA: Diagnosis not present

## 2020-08-12 DIAGNOSIS — N186 End stage renal disease: Secondary | ICD-10-CM | POA: Diagnosis not present

## 2020-08-12 DIAGNOSIS — N2581 Secondary hyperparathyroidism of renal origin: Secondary | ICD-10-CM | POA: Diagnosis not present

## 2020-08-14 DIAGNOSIS — Z992 Dependence on renal dialysis: Secondary | ICD-10-CM | POA: Diagnosis not present

## 2020-08-14 DIAGNOSIS — E1122 Type 2 diabetes mellitus with diabetic chronic kidney disease: Secondary | ICD-10-CM | POA: Diagnosis not present

## 2020-08-14 DIAGNOSIS — N186 End stage renal disease: Secondary | ICD-10-CM | POA: Diagnosis not present

## 2020-08-15 DIAGNOSIS — Z992 Dependence on renal dialysis: Secondary | ICD-10-CM | POA: Diagnosis not present

## 2020-08-15 DIAGNOSIS — N186 End stage renal disease: Secondary | ICD-10-CM | POA: Diagnosis not present

## 2020-08-15 DIAGNOSIS — N2581 Secondary hyperparathyroidism of renal origin: Secondary | ICD-10-CM | POA: Diagnosis not present

## 2020-08-17 DIAGNOSIS — N186 End stage renal disease: Secondary | ICD-10-CM | POA: Diagnosis not present

## 2020-08-17 DIAGNOSIS — Z992 Dependence on renal dialysis: Secondary | ICD-10-CM | POA: Diagnosis not present

## 2020-08-17 DIAGNOSIS — N2581 Secondary hyperparathyroidism of renal origin: Secondary | ICD-10-CM | POA: Diagnosis not present

## 2020-08-19 DIAGNOSIS — N186 End stage renal disease: Secondary | ICD-10-CM | POA: Diagnosis not present

## 2020-08-19 DIAGNOSIS — N2581 Secondary hyperparathyroidism of renal origin: Secondary | ICD-10-CM | POA: Diagnosis not present

## 2020-08-19 DIAGNOSIS — Z992 Dependence on renal dialysis: Secondary | ICD-10-CM | POA: Diagnosis not present

## 2020-08-22 DIAGNOSIS — Z992 Dependence on renal dialysis: Secondary | ICD-10-CM | POA: Diagnosis not present

## 2020-08-22 DIAGNOSIS — N2581 Secondary hyperparathyroidism of renal origin: Secondary | ICD-10-CM | POA: Diagnosis not present

## 2020-08-22 DIAGNOSIS — N186 End stage renal disease: Secondary | ICD-10-CM | POA: Diagnosis not present

## 2020-08-24 DIAGNOSIS — Z992 Dependence on renal dialysis: Secondary | ICD-10-CM | POA: Diagnosis not present

## 2020-08-24 DIAGNOSIS — N2581 Secondary hyperparathyroidism of renal origin: Secondary | ICD-10-CM | POA: Diagnosis not present

## 2020-08-24 DIAGNOSIS — N186 End stage renal disease: Secondary | ICD-10-CM | POA: Diagnosis not present

## 2020-08-26 DIAGNOSIS — N2581 Secondary hyperparathyroidism of renal origin: Secondary | ICD-10-CM | POA: Diagnosis not present

## 2020-08-26 DIAGNOSIS — Z992 Dependence on renal dialysis: Secondary | ICD-10-CM | POA: Diagnosis not present

## 2020-08-26 DIAGNOSIS — N186 End stage renal disease: Secondary | ICD-10-CM | POA: Diagnosis not present

## 2020-08-29 DIAGNOSIS — N2581 Secondary hyperparathyroidism of renal origin: Secondary | ICD-10-CM | POA: Diagnosis not present

## 2020-08-29 DIAGNOSIS — Z992 Dependence on renal dialysis: Secondary | ICD-10-CM | POA: Diagnosis not present

## 2020-08-29 DIAGNOSIS — N186 End stage renal disease: Secondary | ICD-10-CM | POA: Diagnosis not present

## 2020-08-31 DIAGNOSIS — N2581 Secondary hyperparathyroidism of renal origin: Secondary | ICD-10-CM | POA: Diagnosis not present

## 2020-08-31 DIAGNOSIS — Z992 Dependence on renal dialysis: Secondary | ICD-10-CM | POA: Diagnosis not present

## 2020-08-31 DIAGNOSIS — N186 End stage renal disease: Secondary | ICD-10-CM | POA: Diagnosis not present

## 2020-09-02 DIAGNOSIS — N186 End stage renal disease: Secondary | ICD-10-CM | POA: Diagnosis not present

## 2020-09-02 DIAGNOSIS — Z992 Dependence on renal dialysis: Secondary | ICD-10-CM | POA: Diagnosis not present

## 2020-09-02 DIAGNOSIS — N2581 Secondary hyperparathyroidism of renal origin: Secondary | ICD-10-CM | POA: Diagnosis not present

## 2020-09-05 DIAGNOSIS — N2581 Secondary hyperparathyroidism of renal origin: Secondary | ICD-10-CM | POA: Diagnosis not present

## 2020-09-05 DIAGNOSIS — N186 End stage renal disease: Secondary | ICD-10-CM | POA: Diagnosis not present

## 2020-09-05 DIAGNOSIS — Z992 Dependence on renal dialysis: Secondary | ICD-10-CM | POA: Diagnosis not present

## 2020-09-07 DIAGNOSIS — N2581 Secondary hyperparathyroidism of renal origin: Secondary | ICD-10-CM | POA: Diagnosis not present

## 2020-09-07 DIAGNOSIS — N186 End stage renal disease: Secondary | ICD-10-CM | POA: Diagnosis not present

## 2020-09-07 DIAGNOSIS — Z992 Dependence on renal dialysis: Secondary | ICD-10-CM | POA: Diagnosis not present

## 2020-09-09 DIAGNOSIS — N2581 Secondary hyperparathyroidism of renal origin: Secondary | ICD-10-CM | POA: Diagnosis not present

## 2020-09-09 DIAGNOSIS — Z992 Dependence on renal dialysis: Secondary | ICD-10-CM | POA: Diagnosis not present

## 2020-09-09 DIAGNOSIS — N186 End stage renal disease: Secondary | ICD-10-CM | POA: Diagnosis not present

## 2020-09-12 DIAGNOSIS — Z992 Dependence on renal dialysis: Secondary | ICD-10-CM | POA: Diagnosis not present

## 2020-09-12 DIAGNOSIS — N2581 Secondary hyperparathyroidism of renal origin: Secondary | ICD-10-CM | POA: Diagnosis not present

## 2020-09-12 DIAGNOSIS — N186 End stage renal disease: Secondary | ICD-10-CM | POA: Diagnosis not present

## 2020-09-14 DIAGNOSIS — E1122 Type 2 diabetes mellitus with diabetic chronic kidney disease: Secondary | ICD-10-CM | POA: Diagnosis not present

## 2020-09-14 DIAGNOSIS — N186 End stage renal disease: Secondary | ICD-10-CM | POA: Diagnosis not present

## 2020-09-14 DIAGNOSIS — N2581 Secondary hyperparathyroidism of renal origin: Secondary | ICD-10-CM | POA: Diagnosis not present

## 2020-09-14 DIAGNOSIS — Z992 Dependence on renal dialysis: Secondary | ICD-10-CM | POA: Diagnosis not present

## 2020-09-16 DIAGNOSIS — N186 End stage renal disease: Secondary | ICD-10-CM | POA: Diagnosis not present

## 2020-09-16 DIAGNOSIS — T7840XA Allergy, unspecified, initial encounter: Secondary | ICD-10-CM | POA: Diagnosis not present

## 2020-09-16 DIAGNOSIS — N2581 Secondary hyperparathyroidism of renal origin: Secondary | ICD-10-CM | POA: Diagnosis not present

## 2020-09-16 DIAGNOSIS — Z992 Dependence on renal dialysis: Secondary | ICD-10-CM | POA: Diagnosis not present

## 2020-09-19 ENCOUNTER — Other Ambulatory Visit: Payer: Self-pay | Admitting: Gastroenterology

## 2020-09-19 DIAGNOSIS — Z992 Dependence on renal dialysis: Secondary | ICD-10-CM | POA: Diagnosis not present

## 2020-09-19 DIAGNOSIS — N2581 Secondary hyperparathyroidism of renal origin: Secondary | ICD-10-CM | POA: Diagnosis not present

## 2020-09-19 DIAGNOSIS — N186 End stage renal disease: Secondary | ICD-10-CM | POA: Diagnosis not present

## 2020-09-19 NOTE — Telephone Encounter (Signed)
Left message for patient to call back  

## 2020-09-19 NOTE — Telephone Encounter (Signed)
This patient has diabetic gastroparesis and is on reglan  - last seen in office 12/2019.  He is overdue to see me for his condition.  Please refill the reglan with current dosing instructions for one month and a refill, and arrange an appointment to see me in the office within 2 months.  - HD

## 2020-09-21 DIAGNOSIS — N186 End stage renal disease: Secondary | ICD-10-CM | POA: Diagnosis not present

## 2020-09-21 DIAGNOSIS — N2581 Secondary hyperparathyroidism of renal origin: Secondary | ICD-10-CM | POA: Diagnosis not present

## 2020-09-21 DIAGNOSIS — Z992 Dependence on renal dialysis: Secondary | ICD-10-CM | POA: Diagnosis not present

## 2020-09-22 NOTE — Telephone Encounter (Signed)
I spoke to Mr Hammers, he declines to schedule a follow up at this time as he doesn't even know if he even takes the Reglan anymore. He states he still has a cough, and Reglan didn't help improve that either.

## 2020-09-23 DIAGNOSIS — N186 End stage renal disease: Secondary | ICD-10-CM | POA: Diagnosis not present

## 2020-09-23 DIAGNOSIS — N2581 Secondary hyperparathyroidism of renal origin: Secondary | ICD-10-CM | POA: Diagnosis not present

## 2020-09-23 DIAGNOSIS — Z992 Dependence on renal dialysis: Secondary | ICD-10-CM | POA: Diagnosis not present

## 2020-09-26 DIAGNOSIS — N186 End stage renal disease: Secondary | ICD-10-CM | POA: Diagnosis not present

## 2020-09-26 DIAGNOSIS — N2581 Secondary hyperparathyroidism of renal origin: Secondary | ICD-10-CM | POA: Diagnosis not present

## 2020-09-26 DIAGNOSIS — Z992 Dependence on renal dialysis: Secondary | ICD-10-CM | POA: Diagnosis not present

## 2020-09-28 DIAGNOSIS — Z992 Dependence on renal dialysis: Secondary | ICD-10-CM | POA: Diagnosis not present

## 2020-09-28 DIAGNOSIS — N2581 Secondary hyperparathyroidism of renal origin: Secondary | ICD-10-CM | POA: Diagnosis not present

## 2020-09-28 DIAGNOSIS — N186 End stage renal disease: Secondary | ICD-10-CM | POA: Diagnosis not present

## 2020-09-30 DIAGNOSIS — Z992 Dependence on renal dialysis: Secondary | ICD-10-CM | POA: Diagnosis not present

## 2020-09-30 DIAGNOSIS — N2581 Secondary hyperparathyroidism of renal origin: Secondary | ICD-10-CM | POA: Diagnosis not present

## 2020-09-30 DIAGNOSIS — N186 End stage renal disease: Secondary | ICD-10-CM | POA: Diagnosis not present

## 2020-10-03 DIAGNOSIS — Z992 Dependence on renal dialysis: Secondary | ICD-10-CM | POA: Diagnosis not present

## 2020-10-03 DIAGNOSIS — N186 End stage renal disease: Secondary | ICD-10-CM | POA: Diagnosis not present

## 2020-10-03 DIAGNOSIS — N2581 Secondary hyperparathyroidism of renal origin: Secondary | ICD-10-CM | POA: Diagnosis not present

## 2020-10-05 DIAGNOSIS — Z992 Dependence on renal dialysis: Secondary | ICD-10-CM | POA: Diagnosis not present

## 2020-10-05 DIAGNOSIS — N2581 Secondary hyperparathyroidism of renal origin: Secondary | ICD-10-CM | POA: Diagnosis not present

## 2020-10-05 DIAGNOSIS — N186 End stage renal disease: Secondary | ICD-10-CM | POA: Diagnosis not present

## 2020-10-07 DIAGNOSIS — N186 End stage renal disease: Secondary | ICD-10-CM | POA: Diagnosis not present

## 2020-10-07 DIAGNOSIS — N2581 Secondary hyperparathyroidism of renal origin: Secondary | ICD-10-CM | POA: Diagnosis not present

## 2020-10-07 DIAGNOSIS — Z992 Dependence on renal dialysis: Secondary | ICD-10-CM | POA: Diagnosis not present

## 2020-10-10 DIAGNOSIS — N186 End stage renal disease: Secondary | ICD-10-CM | POA: Diagnosis not present

## 2020-10-10 DIAGNOSIS — Z992 Dependence on renal dialysis: Secondary | ICD-10-CM | POA: Diagnosis not present

## 2020-10-10 DIAGNOSIS — N2581 Secondary hyperparathyroidism of renal origin: Secondary | ICD-10-CM | POA: Diagnosis not present

## 2020-10-12 DIAGNOSIS — N186 End stage renal disease: Secondary | ICD-10-CM | POA: Diagnosis not present

## 2020-10-12 DIAGNOSIS — Z992 Dependence on renal dialysis: Secondary | ICD-10-CM | POA: Diagnosis not present

## 2020-10-12 DIAGNOSIS — N2581 Secondary hyperparathyroidism of renal origin: Secondary | ICD-10-CM | POA: Diagnosis not present

## 2020-10-14 DIAGNOSIS — Z992 Dependence on renal dialysis: Secondary | ICD-10-CM | POA: Diagnosis not present

## 2020-10-14 DIAGNOSIS — N2581 Secondary hyperparathyroidism of renal origin: Secondary | ICD-10-CM | POA: Diagnosis not present

## 2020-10-14 DIAGNOSIS — N186 End stage renal disease: Secondary | ICD-10-CM | POA: Diagnosis not present

## 2020-10-14 DIAGNOSIS — E1122 Type 2 diabetes mellitus with diabetic chronic kidney disease: Secondary | ICD-10-CM | POA: Diagnosis not present

## 2020-10-17 DIAGNOSIS — Z992 Dependence on renal dialysis: Secondary | ICD-10-CM | POA: Diagnosis not present

## 2020-10-17 DIAGNOSIS — N2581 Secondary hyperparathyroidism of renal origin: Secondary | ICD-10-CM | POA: Diagnosis not present

## 2020-10-17 DIAGNOSIS — N186 End stage renal disease: Secondary | ICD-10-CM | POA: Diagnosis not present

## 2020-10-19 DIAGNOSIS — Z992 Dependence on renal dialysis: Secondary | ICD-10-CM | POA: Diagnosis not present

## 2020-10-19 DIAGNOSIS — N2581 Secondary hyperparathyroidism of renal origin: Secondary | ICD-10-CM | POA: Diagnosis not present

## 2020-10-19 DIAGNOSIS — N186 End stage renal disease: Secondary | ICD-10-CM | POA: Diagnosis not present

## 2020-10-21 DIAGNOSIS — N2581 Secondary hyperparathyroidism of renal origin: Secondary | ICD-10-CM | POA: Diagnosis not present

## 2020-10-21 DIAGNOSIS — N186 End stage renal disease: Secondary | ICD-10-CM | POA: Diagnosis not present

## 2020-10-21 DIAGNOSIS — Z992 Dependence on renal dialysis: Secondary | ICD-10-CM | POA: Diagnosis not present

## 2020-10-24 DIAGNOSIS — N2581 Secondary hyperparathyroidism of renal origin: Secondary | ICD-10-CM | POA: Diagnosis not present

## 2020-10-24 DIAGNOSIS — N186 End stage renal disease: Secondary | ICD-10-CM | POA: Diagnosis not present

## 2020-10-24 DIAGNOSIS — Z992 Dependence on renal dialysis: Secondary | ICD-10-CM | POA: Diagnosis not present

## 2020-10-26 DIAGNOSIS — Z992 Dependence on renal dialysis: Secondary | ICD-10-CM | POA: Diagnosis not present

## 2020-10-26 DIAGNOSIS — N2581 Secondary hyperparathyroidism of renal origin: Secondary | ICD-10-CM | POA: Diagnosis not present

## 2020-10-26 DIAGNOSIS — N186 End stage renal disease: Secondary | ICD-10-CM | POA: Diagnosis not present

## 2020-10-28 DIAGNOSIS — Z992 Dependence on renal dialysis: Secondary | ICD-10-CM | POA: Diagnosis not present

## 2020-10-28 DIAGNOSIS — N2581 Secondary hyperparathyroidism of renal origin: Secondary | ICD-10-CM | POA: Diagnosis not present

## 2020-10-28 DIAGNOSIS — N186 End stage renal disease: Secondary | ICD-10-CM | POA: Diagnosis not present

## 2020-10-31 DIAGNOSIS — N2581 Secondary hyperparathyroidism of renal origin: Secondary | ICD-10-CM | POA: Diagnosis not present

## 2020-10-31 DIAGNOSIS — N186 End stage renal disease: Secondary | ICD-10-CM | POA: Diagnosis not present

## 2020-10-31 DIAGNOSIS — Z992 Dependence on renal dialysis: Secondary | ICD-10-CM | POA: Diagnosis not present

## 2020-11-02 DIAGNOSIS — N2581 Secondary hyperparathyroidism of renal origin: Secondary | ICD-10-CM | POA: Diagnosis not present

## 2020-11-02 DIAGNOSIS — Z992 Dependence on renal dialysis: Secondary | ICD-10-CM | POA: Diagnosis not present

## 2020-11-02 DIAGNOSIS — N186 End stage renal disease: Secondary | ICD-10-CM | POA: Diagnosis not present

## 2020-11-04 DIAGNOSIS — N186 End stage renal disease: Secondary | ICD-10-CM | POA: Diagnosis not present

## 2020-11-04 DIAGNOSIS — Z992 Dependence on renal dialysis: Secondary | ICD-10-CM | POA: Diagnosis not present

## 2020-11-04 DIAGNOSIS — N2581 Secondary hyperparathyroidism of renal origin: Secondary | ICD-10-CM | POA: Diagnosis not present

## 2020-11-07 DIAGNOSIS — N186 End stage renal disease: Secondary | ICD-10-CM | POA: Diagnosis not present

## 2020-11-07 DIAGNOSIS — N2581 Secondary hyperparathyroidism of renal origin: Secondary | ICD-10-CM | POA: Diagnosis not present

## 2020-11-07 DIAGNOSIS — Z992 Dependence on renal dialysis: Secondary | ICD-10-CM | POA: Diagnosis not present

## 2020-11-09 DIAGNOSIS — N2581 Secondary hyperparathyroidism of renal origin: Secondary | ICD-10-CM | POA: Diagnosis not present

## 2020-11-09 DIAGNOSIS — Z992 Dependence on renal dialysis: Secondary | ICD-10-CM | POA: Diagnosis not present

## 2020-11-09 DIAGNOSIS — N186 End stage renal disease: Secondary | ICD-10-CM | POA: Diagnosis not present

## 2020-11-11 DIAGNOSIS — N2581 Secondary hyperparathyroidism of renal origin: Secondary | ICD-10-CM | POA: Diagnosis not present

## 2020-11-11 DIAGNOSIS — Z992 Dependence on renal dialysis: Secondary | ICD-10-CM | POA: Diagnosis not present

## 2020-11-11 DIAGNOSIS — N186 End stage renal disease: Secondary | ICD-10-CM | POA: Diagnosis not present

## 2020-11-14 DIAGNOSIS — E1122 Type 2 diabetes mellitus with diabetic chronic kidney disease: Secondary | ICD-10-CM | POA: Diagnosis not present

## 2020-11-14 DIAGNOSIS — N2581 Secondary hyperparathyroidism of renal origin: Secondary | ICD-10-CM | POA: Diagnosis not present

## 2020-11-14 DIAGNOSIS — Z992 Dependence on renal dialysis: Secondary | ICD-10-CM | POA: Diagnosis not present

## 2020-11-14 DIAGNOSIS — N186 End stage renal disease: Secondary | ICD-10-CM | POA: Diagnosis not present

## 2020-11-16 DIAGNOSIS — Z992 Dependence on renal dialysis: Secondary | ICD-10-CM | POA: Diagnosis not present

## 2020-11-16 DIAGNOSIS — N186 End stage renal disease: Secondary | ICD-10-CM | POA: Diagnosis not present

## 2020-11-16 DIAGNOSIS — N2581 Secondary hyperparathyroidism of renal origin: Secondary | ICD-10-CM | POA: Diagnosis not present

## 2020-11-18 DIAGNOSIS — N2581 Secondary hyperparathyroidism of renal origin: Secondary | ICD-10-CM | POA: Diagnosis not present

## 2020-11-18 DIAGNOSIS — Z992 Dependence on renal dialysis: Secondary | ICD-10-CM | POA: Diagnosis not present

## 2020-11-18 DIAGNOSIS — N186 End stage renal disease: Secondary | ICD-10-CM | POA: Diagnosis not present

## 2020-11-21 DIAGNOSIS — N2581 Secondary hyperparathyroidism of renal origin: Secondary | ICD-10-CM | POA: Diagnosis not present

## 2020-11-21 DIAGNOSIS — N186 End stage renal disease: Secondary | ICD-10-CM | POA: Diagnosis not present

## 2020-11-21 DIAGNOSIS — Z992 Dependence on renal dialysis: Secondary | ICD-10-CM | POA: Diagnosis not present

## 2020-11-23 DIAGNOSIS — N186 End stage renal disease: Secondary | ICD-10-CM | POA: Diagnosis not present

## 2020-11-23 DIAGNOSIS — N2581 Secondary hyperparathyroidism of renal origin: Secondary | ICD-10-CM | POA: Diagnosis not present

## 2020-11-23 DIAGNOSIS — Z992 Dependence on renal dialysis: Secondary | ICD-10-CM | POA: Diagnosis not present

## 2020-11-25 DIAGNOSIS — N186 End stage renal disease: Secondary | ICD-10-CM | POA: Diagnosis not present

## 2020-11-25 DIAGNOSIS — N2581 Secondary hyperparathyroidism of renal origin: Secondary | ICD-10-CM | POA: Diagnosis not present

## 2020-11-25 DIAGNOSIS — Z992 Dependence on renal dialysis: Secondary | ICD-10-CM | POA: Diagnosis not present

## 2020-12-14 DIAGNOSIS — E1122 Type 2 diabetes mellitus with diabetic chronic kidney disease: Secondary | ICD-10-CM | POA: Diagnosis not present

## 2020-12-14 DIAGNOSIS — N186 End stage renal disease: Secondary | ICD-10-CM | POA: Diagnosis not present

## 2020-12-14 DIAGNOSIS — Z992 Dependence on renal dialysis: Secondary | ICD-10-CM | POA: Diagnosis not present

## 2021-02-06 ENCOUNTER — Other Ambulatory Visit: Payer: Self-pay | Admitting: Gastroenterology

## 2021-05-29 ENCOUNTER — Encounter (HOSPITAL_COMMUNITY): Payer: Self-pay

## 2021-06-02 NOTE — Pre-Procedure Instructions (Signed)
Surgical Instructions    Your procedure is scheduled on Tuesday May 30 th.   Report to [facility] Main Entrance "A" at 05:30 A.M., then check in with the Admitting office.  Call this number if you have problems the morning of surgery:  [phone]   If you have any questions prior to your surgery date call [phone]: Open Monday-Friday 8am-4pm    Remember:  Do not eat after midnight the night before your surgery  You may drink clear liquids until 04:30 A.M. the morning of your surgery.   Clear liquids allowed are: Water, Non-Citrus Juices (without pulp), Carbonated Beverages, Clear Tea, Black Coffee ONLY (NO MILK, CREAM OR POWDERED CREAMER of any kind), and Gatorade   Enhanced Recovery after Surgery for Orthopedics Enhanced Recovery after Surgery is a protocol used to improve the stress on your body and your recovery after surgery.  Patient Instructions  The day of surgery (if you have diabetes):  Drink ONE small 10 oz bottle of G2 by 04:30 am the morning of surgery This bottle was given to you during your hospital  pre-op appointment visit.  Nothing else to drink after completing the  Small 10 oz bottle of water.         If you have questions, please contact your surgeon's office.     Take these medicines the morning of surgery with A SIP OF WATER:   doxycycline (VIBRAMYCIN)   metoCLOPramide (REGLAN)  pantoprazole (PROTONIX)  sevelamer carbonate (RENVELA)   As of today, STOP taking any Aspirin (unless otherwise instructed by your surgeon) Aleve, Naproxen, Ibuprofen, Motrin, Advil, Goody's, BC's, all herbal medications, fish oil, and all vitamins.  WHAT DO I DO ABOUT MY DIABETES MEDICATION?   Do not take oral diabetes medicines (pills) the morning of surgery.  THE NIGHT BEFORE SURGERY, take 5 units of insulin glargine (LANTUS). This is half of your normal dose.       THE MORNING OF SURGERY, DO NOT TAKE insulin aspart (NOVOLOG). If your CBG is greater than 220  mg/dL, you may take  of your sliding scale (correction) dose of insulin.  The day of surgery, do not take other diabetes injectables, including Byetta (exenatide), Bydureon (exenatide ER), Victoza (liraglutide), or Trulicity (dulaglutide).  HOW TO MANAGE YOUR DIABETES BEFORE AND AFTER SURGERY  Why is it important to control my blood sugar before and after surgery? Improving blood sugar levels before and after surgery helps healing and can limit problems. A way of improving blood sugar control is eating a healthy diet by:  Eating less sugar and carbohydrates  Increasing activity/exercise  Talking with your doctor about reaching your blood sugar goals High blood sugars (greater than 180 mg/dL) can raise your risk of infections and slow your recovery, so you will need to focus on controlling your diabetes during the weeks before surgery. Make sure that the doctor who takes care of your diabetes knows about your planned surgery including the date and location.  How do I manage my blood sugar before surgery? Check your blood sugar at least 4 times a day, starting 2 days before surgery, to make sure that the level is not too high or low.  Check your blood sugar the morning of your surgery when you wake up and every 2 hours until you get to the Short Stay unit.  If your blood sugar is less than 70 mg/dL, you will need to treat for low blood sugar: Do not take insulin. Treat a low blood sugar (less  than 70 mg/dL) with  cup of clear juice (cranberry or apple), 4 glucose tablets, OR glucose gel. Recheck blood sugar in 15 minutes after treatment (to make sure it is greater than 70 mg/dL). If your blood sugar is not greater than 70 mg/dL on recheck, call [phone] for further instructions. Report your blood sugar to the short stay nurse when you get to Short Stay.  If you are admitted to the hospital after surgery: Your blood sugar will be checked by the staff and you will probably be given  insulin after surgery (instead of oral diabetes medicines) to make sure you have good blood sugar levels. The goal for blood sugar control after surgery is 80-180 mg/dL.           Do not wear jewelry or makeup Do not wear lotions, powders, perfumes/colognes, or deodorant. Do not shave 48 hours prior to surgery.  Men may shave face and neck. Do not bring valuables to the hospital. Do not wear nail polish, gel polish, artificial nails, or any other type of covering on natural nails (fingers and toes) If you have artificial nails or gel coating that need to be removed by a nail salon, please have this removed prior to surgery. Artificial nails or gel coating may interfere with anesthesia's ability to adequately monitor your vital signs.  [facility] is not responsible for any belongings or valuables. .   Do NOT Smoke (Tobacco/Vaping)  24 hours prior to your procedure  If you use a CPAP at night, you may bring your mask for your overnight stay.   Contacts, glasses, hearing aids, dentures or partials may not be worn into surgery, please bring cases for these belongings   For patients admitted to the hospital, discharge time will be determined by your treatment team.   Patients discharged the day of surgery will not be allowed to drive home, and someone needs to stay with them for 24 hours.   SURGICAL WAITING ROOM VISITATION Patients having surgery or a procedure in a hospital may have two support people. Children under the age of 73 must have an adult with them who is not the patient. They may stay in the waiting area during the procedure and may switch out with other visitors. If the patient needs to stay at the hospital during part of their recovery, the visitor guidelines for inpatient rooms apply.  Please refer to the [facility] website for the visitor guidelines for Inpatients (after your surgery is over and you are in a regular room).       Special instructions:    Oral Hygiene  is also important to reduce your risk of infection.  Remember - BRUSH YOUR TEETH THE MORNING OF SURGERY WITH YOUR REGULAR TOOTHPASTE   - Preparing For Surgery  Before surgery, you can play an important role. Because skin is not sterile, your skin needs to be as free of germs as possible. You can reduce the number of germs on your skin by washing with CHG (chlorahexidine gluconate) Soap before surgery.  CHG is an antiseptic cleaner which kills germs and bonds with the skin to continue killing germs even after washing.     Please do not use if you have an allergy to CHG or antibacterial soaps. If your skin becomes reddened/irritated stop using the CHG.  Do not shave (including legs and underarms) for at least 48 hours prior to first CHG shower. It is OK to shave your face.  Please follow these instructions carefully.  Shower the NIGHT BEFORE SURGERY and the MORNING OF SURGERY with CHG Soap.   If you chose to wash your hair, wash your hair first as usual with your normal shampoo. After you shampoo, rinse your hair and body thoroughly to remove the shampoo.  Then [employer] and genitals (private parts) with your normal soap and rinse thoroughly to remove soap.  After that Use CHG Soap as you would any other liquid soap. You can apply CHG directly to the skin and wash gently with a scrungie or a clean washcloth.   Apply the CHG Soap to your body ONLY FROM THE NECK DOWN.  Do not use on open wounds or open sores. Avoid contact with your eyes, ears, mouth and genitals (private parts). Wash Face and genitals (private parts)  with your normal soap.   Wash thoroughly, paying special attention to the area where your surgery will be performed.  Thoroughly rinse your body with warm water from the neck down.  DO NOT shower/wash with your normal soap after using and rinsing off the CHG Soap.  Pat yourself dry with a CLEAN TOWEL.  Wear CLEAN PAJAMAS to bed the night before surgery  Place  CLEAN SHEETS on your bed the night before your surgery  DO NOT SLEEP WITH PETS.   Day of Surgery:  Take a shower with CHG soap. Wear Clean/Comfortable clothing the morning of surgery Do not apply any deodorants/lotions.   Remember to brush your teeth WITH YOUR REGULAR TOOTHPASTE.    If you received a COVID test during your pre-op visit, it is requested that you wear a mask when out in public, stay away from anyone that may not be feeling well, and notify your surgeon if you develop symptoms. If you have been in contact with anyone that has tested positive in the last 10 days, please notify your surgeon.    Please read over the following fact sheets that you were given.

## 2021-06-05 ENCOUNTER — Other Ambulatory Visit (HOSPITAL_COMMUNITY): Payer: BC Managed Care – PPO

## 2021-06-05 ENCOUNTER — Encounter (HOSPITAL_COMMUNITY): Payer: Self-pay

## 2021-06-05 ENCOUNTER — Encounter (HOSPITAL_COMMUNITY)
Admission: RE | Admit: 2021-06-05 | Discharge: 2021-06-05 | Disposition: A | Discharge status: Home / Self Care | Payer: Medicare Other | Source: Ambulatory Visit | Attending: Orthopedic Surgery | Admitting: Orthopedic Surgery

## 2021-06-05 ENCOUNTER — Other Ambulatory Visit: Payer: Self-pay

## 2021-06-05 VITALS — BP 161/74 | HR 75 | Temp 98.7°F | Resp 18 | Ht 71.0 in | Wt 206.4 lb

## 2021-06-05 DIAGNOSIS — N186 End stage renal disease: Secondary | ICD-10-CM | POA: Diagnosis not present

## 2021-06-05 DIAGNOSIS — Z794 Long term (current) use of insulin: Secondary | ICD-10-CM | POA: Diagnosis not present

## 2021-06-05 DIAGNOSIS — E1140 Type 2 diabetes mellitus with diabetic neuropathy, unspecified: Secondary | ICD-10-CM | POA: Insufficient documentation

## 2021-06-05 DIAGNOSIS — T8484XA Pain due to internal orthopedic prosthetic devices, implants and grafts, initial encounter: Secondary | ICD-10-CM | POA: Insufficient documentation

## 2021-06-05 DIAGNOSIS — I1311 Hypertensive heart and chronic kidney disease without heart failure, with stage 5 chronic kidney disease, or end stage renal disease: Secondary | ICD-10-CM | POA: Insufficient documentation

## 2021-06-05 DIAGNOSIS — Z992 Dependence on renal dialysis: Secondary | ICD-10-CM | POA: Insufficient documentation

## 2021-06-05 DIAGNOSIS — Z01818 Encounter for other preprocedural examination: Secondary | ICD-10-CM | POA: Diagnosis present

## 2021-06-05 DIAGNOSIS — E1122 Type 2 diabetes mellitus with diabetic chronic kidney disease: Secondary | ICD-10-CM | POA: Insufficient documentation

## 2021-06-05 DIAGNOSIS — I10 Essential (primary) hypertension: Secondary | ICD-10-CM

## 2021-06-05 DIAGNOSIS — Y828 Other medical devices associated with adverse incidents: Secondary | ICD-10-CM | POA: Diagnosis not present

## 2021-06-05 LAB — GLUCOSE, CAPILLARY: Glucose-Capillary: 111 mg/dL — ABNORMAL HIGH (ref 70–99)

## 2021-06-05 NOTE — Progress Notes (Signed)
Requested CBC, CMP, and A1C from [facility] @ [facility].

## 2021-06-05 NOTE — Progress Notes (Addendum)
PCP - Domenick Gong Cardiologist - Denies  PPM/ICD - Denies  Chest x-ray - 07/20/20 EKG - 06/05/21 Stress Test - 11/03/14 ECHO - Denies Cardiac Cath - Denies  Sleep Study - Denies  DM Type I Fasting Blood Sugar - 110-140 Checks Blood Sugar __1-2___ times a day CBG @ Pat appt 111  Blood Thinner Instructions:  Denies Aspirin Instructions: Denies  ERAS Protcol - Yes PRE-SURGERY G2-  given  Anesthesia review: yes abnormal EKG  Patient denies shortness of breath, fever, cough and chest pain at PAT appointment   All instructions explained to the patient, with a verbal understanding of the material. Patient agrees to go over the instructions while at home for a better understanding. The opportunity to ask questions was provided.

## 2021-06-05 NOTE — Progress Notes (Signed)
Surgical Instructions                 Your procedure is scheduled on Tuesday May 30 th.               Report to [facility] Main Entrance "A" at 05:30 A.M., then check in with the Admitting office.             Call this number if you have problems the morning of surgery:             [phone]    If you have any questions prior to your surgery date call [phone]: Open Monday-Friday 8am-4pm                 Remember:             Do not eat after midnight the night before your surgery   You may drink clear liquids until 04:30 A.M. the morning of your surgery.   Clear liquids allowed are: Water, Non-Citrus Juices (without pulp), Carbonated Beverages, Clear Tea, Black Coffee ONLY (NO MILK, CREAM OR POWDERED CREAMER of any kind), and Gatorade     Enhanced Recovery after Surgery for Orthopedics Enhanced Recovery after Surgery is a protocol used to improve the stress on your body and your recovery after surgery.   Patient Instructions   The day of surgery (if you have diabetes):   Drink ONE small 10 oz bottle of G2 by 04:30 am the morning of surgery This bottle was given to you during your hospital  pre-op appointment visit.  Nothing else to drink after completing the  Small 10 oz bottle of water.          If you have questions, please contact your surgeon's office.                           Take these medicines the morning of surgery with A SIP OF WATER:              Norvasc 10mg  tablet             sevelamer carbonate (RENVELA)     As of today, STOP taking any Aspirin (unless otherwise instructed by your surgeon) Aleve, Naproxen, Ibuprofen, Motrin, Advil, Goody's, BC's, all herbal medications, fish oil, and all vitamins.   WHAT DO I DO ABOUT MY DIABETES MEDICATION?     Do not take oral diabetes medicines (pills) the morning of surgery.   THE NIGHT BEFORE SURGERY, take 4 units of insulin glargine (LANTUS). This is half of your normal dose.                                                  THE MORNING OF SURGERY, If your CBG is greater than 220 mg/dL, you may take  of your sliding scale (correction) dose of insulin.   The day of surgery, do not take other diabetes injectables, including Byetta (exenatide), Bydureon (exenatide ER), Victoza (liraglutide), or Trulicity (dulaglutide).   HOW TO MANAGE YOUR DIABETES BEFORE AND AFTER SURGERY   Why is it important to control my blood sugar before and after surgery? Improving blood sugar levels before and after surgery helps healing and can limit problems. A way of improving blood sugar control is eating a healthy diet by:  Eating less  sugar and carbohydrates  Increasing activity/exercise  Talking with your doctor about reaching your blood sugar goals High blood sugars (greater than 180 mg/dL) can raise your risk of infections and slow your recovery, so you will need to focus on controlling your diabetes during the weeks before surgery. Make sure that the doctor who takes care of your diabetes knows about your planned surgery including the date and location.   How do I manage my blood sugar before surgery? Check your blood sugar at least 4 times a day, starting 2 days before surgery, to make sure that the level is not too high or low.   Check your blood sugar the morning of your surgery when you wake up and every 2 hours until you get to the Short Stay unit.   If your blood sugar is less than 70 mg/dL, you will need to treat for low blood sugar: Do not take insulin. Treat a low blood sugar (less than 70 mg/dL) with  cup of clear juice (cranberry or apple), 4 glucose tablets, OR glucose gel. Recheck blood sugar in 15 minutes after treatment (to make sure it is greater than 70 mg/dL). If your blood sugar is not greater than 70 mg/dL on recheck, call [phone] for further instructions. Report your blood sugar to the short stay nurse when you get to Short Stay.   If you are admitted to the hospital after  surgery: Your blood sugar will be checked by the staff and you will probably be given insulin after surgery (instead of oral diabetes medicines) to make sure you have good blood sugar levels. The goal for blood sugar control after surgery is 80-180 mg/dL.           Do not wear jewelry or makeup Do not wear lotions, powders, perfumes/colognes, or deodorant. Do not shave 48 hours prior to surgery.  Men may shave face and neck. Do not bring valuables to the hospital. Do not wear nail polish, gel polish, artificial nails, or any other type of covering on natural nails (fingers and toes) If you have artificial nails or gel coating that need to be removed by a nail salon, please have this removed prior to surgery. Artificial nails or gel coating may interfere with anesthesia's ability to adequately monitor your vital signs.   [facility] is not responsible for any belongings or valuables. .    Do NOT Smoke (Tobacco/Vaping)  24 hours prior to your procedure   If you use a CPAP at night, you may bring your mask for your overnight stay.   Contacts, glasses, hearing aids, dentures or partials may not be worn into surgery, please bring cases for these belongings   For patients admitted to the hospital, discharge time will be determined by your treatment team.   Patients discharged the day of surgery will not be allowed to drive home, and someone needs to stay with them for 24 hours.     SURGICAL WAITING ROOM VISITATION Patients having surgery or a procedure in a hospital may have two support people. Children under the age of 70 must have an adult with them who is not the patient. They may stay in the waiting area during the procedure and may switch out with other visitors. If the patient needs to stay at the hospital during part of their recovery, the visitor guidelines for inpatient rooms apply.   Please refer to the [facility] website for the visitor guidelines for Inpatients (after your surgery  is over and  you are in a regular room).            Special instructions:     Oral Hygiene is also important to reduce your risk of infection.  Remember - BRUSH YOUR TEETH THE MORNING OF SURGERY WITH YOUR REGULAR TOOTHPASTE     - Preparing For Surgery   Before surgery, you can play an important role. Because skin is not sterile, your skin needs to be as free of germs as possible. You can reduce the number of germs on your skin by washing with CHG (chlorahexidine gluconate) Soap before surgery.  CHG is an antiseptic cleaner which kills germs and bonds with the skin to continue killing germs even after washing.       Please do not use if you have an allergy to CHG or antibacterial soaps. If your skin becomes reddened/irritated stop using the CHG.  Do not shave (including legs and underarms) for at least 48 hours prior to first CHG shower. It is OK to shave your face.   Please follow these instructions carefully.                                                                                                                                  Shower the NIGHT BEFORE SURGERY and the MORNING OF SURGERY with CHG Soap.  If you chose to wash your hair, wash your hair first as usual with your normal shampoo. After you shampoo, rinse your hair and body thoroughly to remove the shampoo.  Then [employer] and genitals (private parts) with your normal soap and rinse thoroughly to remove soap.   After that Use CHG Soap as you would any other liquid soap. You can apply CHG directly to the skin and wash gently with a scrungie or a clean washcloth.    Apply the CHG Soap to your body ONLY FROM THE NECK DOWN.  Do not use on open wounds or open sores. Avoid contact with your eyes, ears, mouth and genitals (private parts). Wash Face and genitals (private parts)  with your normal soap.    Wash thoroughly, paying special attention to the area where your surgery will be performed.   Thoroughly rinse your  body with warm water from the neck down.   DO NOT shower/wash with your normal soap after using and rinsing off the CHG Soap.   Pat yourself dry with a CLEAN TOWEL.   Wear CLEAN PAJAMAS to bed the night before surgery   Place CLEAN SHEETS on your bed the night before your surgery   DO NOT SLEEP WITH PETS.     Day of Surgery:   Take a shower with CHG soap. Wear Clean/Comfortable clothing the morning of surgery Do not apply any deodorants/lotions.   Remember to brush your teeth WITH YOUR REGULAR TOOTHPASTE.       If you received a COVID test during your pre-op visit, it is requested that  you wear a mask when out in public, stay away from anyone that may not be feeling well, and notify your surgeon if you develop symptoms. If you have been in contact with anyone that has tested positive in the last 10 days, please notify your surgeon.     Please read over the following fact sheets that you were given.                  Note Details  Author Tami Lin, RN File Time 06/02/2021  1:03 PM  Author Type Registered Nurse Status Signed  Last Editor Tami Lin, RN Service (none)  [place] # [id number] Admit Date 06/05/2021

## 2021-06-06 NOTE — Anesthesia Preprocedure Evaluation (Addendum)
Anesthesia Evaluation  Patient identified by MRN, date of birth, ID band Patient awake    Reviewed: Allergy & Precautions, H&P , NPO status , Patient's Chart, lab work & pertinent test results  Airway Mallampati: II  TM Distance: >3 FB Neck ROM: Full    Dental no notable dental hx. (+) Teeth Intact, Dental Advisory Given   Pulmonary neg pulmonary ROS,    Pulmonary exam normal breath sounds clear to auscultation       Cardiovascular Exercise Tolerance: Good hypertension, Pt. on medications  Rhythm:Regular Rate:Normal     Neuro/Psych Depression negative neurological ROS     GI/Hepatic negative GI ROS, Neg liver ROS,   Endo/Other  diabetes, Well Controlled, Type 1, Insulin Dependent  Renal/GU ESRF and DialysisRenal disease  negative genitourinary   Musculoskeletal   Abdominal   Peds  Hematology negative hematology ROS (+)   Anesthesia Other Findings   Reproductive/Obstetrics negative OB ROS                           Anesthesia Physical Anesthesia Plan  ASA: 3  Anesthesia Plan: General   Post-op Pain Management: Tylenol PO (pre-op)*   Induction: Intravenous  PONV Risk Score and Plan: 3 and Ondansetron and Midazolam  Airway Management Planned: LMA  Additional Equipment:   Intra-op Plan:   Post-operative Plan: Extubation in OR  Informed Consent: I have reviewed the patients History and Physical, chart, labs and discussed the procedure including the risks, benefits and alternatives for the proposed anesthesia with the patient or authorized representative who has indicated his/her understanding and acceptance.     Dental advisory given  Plan Discussed with: CRNA  Anesthesia Plan Comments: (PAT note written 06/06/2021 by Shonna Chock, PA-C. ESRD. )      Anesthesia Quick Evaluation

## 2021-06-06 NOTE — Progress Notes (Addendum)
Anesthesia Chart Review:  Case: [id number] Date/Time: 06/13/21 0715   Procedure: HARDWARE REMOVAL (Right)   Anesthesia type: Choice   Pre-op diagnosis: PAINFUL HARDWARE RIGHT KNEE   Location: [facility] OR ROOM 04 / [place] OR   Surgeons: Marchia Bond, MD       DISCUSSION: 51 year old male scheduled for the above procedure.  History includes never smoker, HTN, IDDM (with neuropathy, retinopathy, nephropathy; delayed gastric emptying scan 11/12/19), ESRD (PD catheter 11/28/17-01/06/19; on hemodialysis, left brachiocephalic AVF 19/50/93), psoriasis, perforated appendix (s/p laparotomy, ileocecectomy 09/27/13), hernia (incisional hernia repair, LOA during surgery for PD catheter insertion 11/28/17; exploratory laparotomy, LOA, repair of incarcerated incisional hernia 08/11/18), right leg fracture (s/p ORIF right fibula and right bicondylar tibial plateau fracture 03/07/19).   Surgeon requested preoperative medical and renal input. Juanell Fairly, NP with nephrology advised dosing all medication to appropriate Creatinine clearance and advised against morphine (letter in shadow chart). Per 04/26/21 notation in [place], "Medically cleared for upcoming surgery, form completed. He had prior surgery following fracture. His clearance was held by Ortho due to his A1C of 9% with noncompliance of insulin use due to financial issues, which have been fixed with the 2023 law change limiting $35/month." Latest A1c 5.8% on 05/30/21.  He reported home fasting CBGs around 110-140. 05/30/21 labs from [facility] (copies on shadow chart) included a CMP CBC, and A1c--results included: glucose 120, total bilirubin 1.1, creatinine 6.7, sodium 139, potassium 4.7, calcium 9.1, total protein 6.8, albumin 3.7, AST 16, ALT 6, alkaline phosphatase 156, BUN 49, WBCs 4.64, hemoglobin 11.4, hematocrit 32.5, platelet count 101K, A1c 5.8%. He has ESRD, so will need an ISTAT on arrival.  Anesthesia team to evaluate on  the day of surgery. Hemodialysis days are documented as MWF, surgery is scheduled for a Tuesday.    VS: BP (!) 161/74   Pulse 75   Temp 37.1 C (Oral)   Resp 18   Ht 5\' 11"  (1.803 m)   Wt 93.6 kg   SpO2 99%   BMI 28.79 kg/m    PROVIDERS: Tisovec, Fransico Him, MD is PCP [facility]     LABS: For day of surgery as indicated. See DISCUSSION. Labs Reviewed  GLUCOSE, CAPILLARY - Abnormal; Notable for the following components:      Result Value   Glucose-Capillary 111 (*)    All other components within normal limits     IMAGES: CXR 07/20/20: FINDINGS: Previous right-sided dialysis catheter is been removed.The cardiomediastinal contours are normal. There is mild peribronchial thickening. Small bilateral pleural effusions with trace fluid in the fissures. No confluent airspace disease. No acute osseous abnormalities are seen. IMPRESSION: Small bilateral pleural effusions and peribronchial thickening, suggesting pulmonary edema.   EKG: 06/05/21 Normal sinus rhythm Moderate voltage criteria for LVH, may be normal variant ( R in aVL , Cornell product ) Nonspecific T wave abnormality Prolonged QT [QT 458 ms, QTcB 511 ms] Abnormal ECG When compared with ECG of 07-Mar-2019 07:19, QT has lengthened Confirmed by Gwyndolyn Kaufman [phone]) on 06/05/2021 8:31:31 PM   CV: Echo 02/25/19 ([facility]): SUMMARY  There is borderline concentric left ventricular hypertrophy.  The left ventricular wall motion is normal. Left ventricular systolic  function is normal. LV ejection fraction = 65-70%.  Left ventricular filling pattern is normal.  There is no significant valvular stenosis or regurgitation.  RVSP not able to be calculated.    Nuclear stress test 11/03/14: The left ventricular ejection fraction is normal (55-65%). Nuclear stress EF:  59%. There was no ST segment deviation noted during stress. No T wave inversion was noted during stress. This is a low risk  study.  No reversible ischemia. LVEF 59% with normal wall motion. This is a low risk study.   Past Medical History:  Diagnosis Date   Abscess of buttock    Acute kidney injury ([place]) 09/27/2013   Depression    Diabetes mellitus without complication ([place])    Diabetic neuropathy ([place])    Dialysis patient ([place])    M,W,F   ESRD (end stage renal disease) ([place])    Glaucoma    HTN (hypertension) 08/08/2018   Obesity    Perforated appendix    Peritonitis   Psoriasis    Retinopathy     Past Surgical History:  Procedure Laterality Date   ABSCESS      RECTAL    APPENDECTOMY     AV FISTULA PLACEMENT Left 01/13/2019   Procedure: LEFT ARM ARTERIOVENOUS (AV) FISTULA CREATION;  Surgeon: Elam Dutch, MD;  Location: [facility];  Service: Vascular;  Laterality: Left;   CATARACT EXTRACTION     Ex lap with ileocecectomy  09/27/13   Dr. Brantley Stage   ILEOCECETOMY     INCISION AND DRAINAGE     INCISIONAL HERNIA REPAIR N/A 08/11/2018   Procedure: OPEN RETRORECTUS REPAIR OF INCARCERATED INCISIONAL HERNIA WITH BILATERAL POSTERIOR COMPONENT SEPARATION;  Surgeon: [name], MD;  Location: [facility];  Service: General;  Laterality: N/A;   INSERTION OF MESH  08/11/2018   Procedure: INSERTION OF PHASIX MESH;  Surgeon: [name], MD;  Location: [facility];  Service: General;;   insertion of PD catheter  12/2017   IR FLUORO GUIDE CV LINE RIGHT  08/08/2018   IR US GUIDE VASC ACCESS RIGHT  08/08/2018   LAPAROSCOPIC ABDOMINAL EXPLORATION N/A 09/27/2013   LAPAROSCOPIC ASSISTED VENTRAL HERNIA REPAIR  2019   LAPAROSCOPY N/A 09/27/2013   Procedure: LAPAROSCOPY DIAGNOSTIC;  Surgeon: Erroll Luna, MD;  Location: [facility];  Service: General;  Laterality: N/A;   LAPAROTOMY  08/11/2018   Procedure: EXPLORATORY LAPAROTOMY;  Surgeon: [name], MD;  Location: [facility];  Service: General;;   LYSIS OF ADHESION  08/11/2018   Procedure: LYSIS OF ADHESIONS X 30 MINUTES;  Surgeon: [name], MD;  Location: [facility];  Service: General;;    MINOR REMOVAL OF PERITONEAL DIALYSIS CATHETER N/A 01/06/2019   Procedure: REMOVAL OF PERITONEAL DIALYSIS CATHETER;  Surgeon: Kieth Brightly Arta Bruce, MD;  [facility];  Service: General;  Laterality: N/A;   OPEN REDUCTION INTERNAL FIXATION (ORIF) TIBIA/FIBULA FRACTURE Right 03/07/2019   Procedure: OPEN REDUCTION INTERNAL FIXATION (ORIF) FIBULA FRACTURE;  Surgeon: Marchia Bond, MD;  Location: [facility];  Service: Orthopedics;  Laterality: Right;   ORIF TIBIA PLATEAU Right 03/07/2019   Procedure: Open Reduction Internal Fixation (Orif) Tibial Plateau;  Surgeon: Marchia Bond, MD;  Location: [facility];  Service: Orthopedics;  Laterality: Right;   SHOULDER SURGERY  1989    MEDICATIONS:  amLODipine (NORVASC) 10 MG tablet   insulin aspart (NOVOLOG) 100 UNIT/ML FlexPen   insulin glargine (LANTUS) 100 UNIT/ML Solostar Pen   sevelamer carbonate (RENVELA) 800 MG tablet   No current facility-administered medications for this encounter.    Myra Gianotti, PA-C Surgical Short Stay/Anesthesiology [facility] Phone [phone] [facility] Phone [phone] 06/06/2021 2:00 PM

## 2021-06-13 ENCOUNTER — Ambulatory Visit (HOSPITAL_COMMUNITY): Payer: Medicare Other

## 2021-06-13 ENCOUNTER — Encounter (HOSPITAL_COMMUNITY): Payer: Self-pay | Admitting: Orthopedic Surgery

## 2021-06-13 ENCOUNTER — Ambulatory Visit (HOSPITAL_COMMUNITY): Payer: Medicare Other | Admitting: Vascular Surgery

## 2021-06-13 ENCOUNTER — Ambulatory Visit (HOSPITAL_COMMUNITY)
Admission: RE | Admit: 2021-06-13 | Discharge: 2021-06-13 | Disposition: A | Discharge status: Home / Self Care | Payer: Medicare Other | Attending: Orthopedic Surgery | Admitting: Orthopedic Surgery

## 2021-06-13 ENCOUNTER — Encounter (HOSPITAL_COMMUNITY)
Admission: RE | Disposition: A | Discharge status: Home / Self Care | Payer: Self-pay | Source: Home / Self Care | Attending: Orthopedic Surgery

## 2021-06-13 ENCOUNTER — Other Ambulatory Visit: Payer: Self-pay

## 2021-06-13 ENCOUNTER — Ambulatory Visit (HOSPITAL_BASED_OUTPATIENT_CLINIC_OR_DEPARTMENT_OTHER): Payer: Medicare Other | Admitting: Anesthesiology

## 2021-06-13 DIAGNOSIS — M25561 Pain in right knee: Secondary | ICD-10-CM | POA: Diagnosis not present

## 2021-06-13 DIAGNOSIS — Y793 Surgical instruments, materials and orthopedic devices (including sutures) associated with adverse incidents: Secondary | ICD-10-CM | POA: Insufficient documentation

## 2021-06-13 DIAGNOSIS — N186 End stage renal disease: Secondary | ICD-10-CM | POA: Insufficient documentation

## 2021-06-13 DIAGNOSIS — Z992 Dependence on renal dialysis: Secondary | ICD-10-CM | POA: Diagnosis not present

## 2021-06-13 DIAGNOSIS — Z794 Long term (current) use of insulin: Secondary | ICD-10-CM | POA: Insufficient documentation

## 2021-06-13 DIAGNOSIS — E1022 Type 1 diabetes mellitus with diabetic chronic kidney disease: Secondary | ICD-10-CM | POA: Insufficient documentation

## 2021-06-13 DIAGNOSIS — I120 Hypertensive chronic kidney disease with stage 5 chronic kidney disease or end stage renal disease: Secondary | ICD-10-CM

## 2021-06-13 DIAGNOSIS — T84116A Breakdown (mechanical) of internal fixation device of bone of right lower leg, initial encounter: Secondary | ICD-10-CM | POA: Diagnosis present

## 2021-06-13 DIAGNOSIS — Z472 Encounter for removal of internal fixation device: Secondary | ICD-10-CM | POA: Diagnosis not present

## 2021-06-13 HISTORY — PX: HARDWARE REMOVAL: SHX979

## 2021-06-13 LAB — POCT I-STAT, CHEM 8
BUN: 40 mg/dL — ABNORMAL HIGH (ref 6–20)
Calcium, Ion: 1.13 mmol/L — ABNORMAL LOW (ref 1.15–1.40)
Chloride: 94 mmol/L — ABNORMAL LOW (ref 98–111)
Creatinine, Ser: 6.6 mg/dL — ABNORMAL HIGH (ref 0.61–1.24)
Glucose, Bld: 180 mg/dL — ABNORMAL HIGH (ref 70–99)
HCT: 29 % — ABNORMAL LOW (ref 39.0–52.0)
Hemoglobin: 9.9 g/dL — ABNORMAL LOW (ref 13.0–17.0)
Potassium: 4.4 mmol/L (ref 3.5–5.1)
Sodium: 136 mmol/L (ref 135–145)
TCO2: 32 mmol/L (ref 22–32)

## 2021-06-13 LAB — GLUCOSE, CAPILLARY
Glucose-Capillary: 191 mg/dL — ABNORMAL HIGH (ref 70–99)
Glucose-Capillary: 112 mg/dL — ABNORMAL HIGH (ref 70–99)

## 2021-06-13 SURGERY — REMOVAL, HARDWARE
Anesthesia: General | Site: Knee | Laterality: Right

## 2021-06-13 MED ORDER — CHLORHEXIDINE GLUCONATE 0.12 % MT SOLN
15.0000 mL | Freq: Once | OROMUCOSAL | Status: AC
  Administered 2021-06-13: 15 mL via OROMUCOSAL
  Filled 2021-06-13: qty 15

## 2021-06-13 MED ORDER — ACETAMINOPHEN 500 MG PO TABS
1000.0000 mg | ORAL_TABLET | Freq: Once | ORAL | Status: AC
  Administered 2021-06-13: 1000 mg via ORAL
  Filled 2021-06-13: qty 2

## 2021-06-13 MED ORDER — INSULIN ASPART 100 UNIT/ML IJ SOLN
0.0000 [IU] | INTRAMUSCULAR | Status: DC | PRN
  Administered 2021-06-13: 2 [IU] via SUBCUTANEOUS

## 2021-06-13 MED ORDER — HYDROMORPHONE HCL 1 MG/ML IJ SOLN: 0.2500 mg | INTRAMUSCULAR | Status: DC | PRN

## 2021-06-13 MED ORDER — ONDANSETRON HCL 4 MG/2ML IJ SOLN
INTRAMUSCULAR | Status: DC | PRN
  Administered 2021-06-13: 4 mg via INTRAVENOUS

## 2021-06-13 MED ORDER — BUPIVACAINE HCL (PF) 0.25 % IJ SOLN
INTRAMUSCULAR | Status: AC
  Filled 2021-06-13: qty 30

## 2021-06-13 MED ORDER — SODIUM CHLORIDE 0.9 % IV SOLN: INTRAVENOUS | Status: DC | PRN

## 2021-06-13 MED ORDER — PHENYLEPHRINE 80 MCG/ML (10ML) SYRINGE FOR IV PUSH (FOR BLOOD PRESSURE SUPPORT)
PREFILLED_SYRINGE | INTRAVENOUS | Status: DC | PRN
  Administered 2021-06-13 (×6): 80 ug via INTRAVENOUS

## 2021-06-13 MED ORDER — BUPIVACAINE HCL (PF) 0.25 % IJ SOLN
INTRAMUSCULAR | Status: DC | PRN
  Administered 2021-06-13: 20 mL

## 2021-06-13 MED ORDER — PROPOFOL 10 MG/ML IV BOLUS
INTRAVENOUS | Status: DC | PRN
  Administered 2021-06-13: 130 mg via INTRAVENOUS

## 2021-06-13 MED ORDER — ONDANSETRON HCL 4 MG PO TABS: 4.0000 mg | ORAL_TABLET | Freq: Three times a day (TID) | ORAL | 0 refills | Status: AC | PRN

## 2021-06-13 MED ORDER — INSULIN ASPART 100 UNIT/ML IJ SOLN
INTRAMUSCULAR | Status: AC
  Filled 2021-06-13: qty 1

## 2021-06-13 MED ORDER — LACTATED RINGERS IV SOLN: INTRAVENOUS | Status: DC

## 2021-06-13 MED ORDER — FENTANYL CITRATE (PF) 250 MCG/5ML IJ SOLN
INTRAMUSCULAR | Status: AC
  Filled 2021-06-13: qty 5

## 2021-06-13 MED ORDER — MIDAZOLAM HCL 5 MG/5ML IJ SOLN
INTRAMUSCULAR | Status: DC | PRN
  Administered 2021-06-13: 2 mg via INTRAVENOUS

## 2021-06-13 MED ORDER — ORAL CARE MOUTH RINSE: 15.0000 mL | Freq: Once | OROMUCOSAL | Status: AC

## 2021-06-13 MED ORDER — POVIDONE-IODINE 10 % EX SWAB: 2.0000 "application " | Freq: Once | CUTANEOUS | Status: DC

## 2021-06-13 MED ORDER — 0.9 % SODIUM CHLORIDE (POUR BTL) OPTIME
TOPICAL | Status: DC | PRN
  Administered 2021-06-13: 1000 mL

## 2021-06-13 MED ORDER — CEFAZOLIN SODIUM-DEXTROSE 2-4 GM/100ML-% IV SOLN
2.0000 g | INTRAVENOUS | Status: AC
  Administered 2021-06-13: 2 g via INTRAVENOUS
  Filled 2021-06-13: qty 100

## 2021-06-13 MED ORDER — PROPOFOL 10 MG/ML IV BOLUS
INTRAVENOUS | Status: AC
  Filled 2021-06-13: qty 20

## 2021-06-13 MED ORDER — FENTANYL CITRATE (PF) 250 MCG/5ML IJ SOLN
INTRAMUSCULAR | Status: DC | PRN
  Administered 2021-06-13 (×3): 50 ug via INTRAVENOUS

## 2021-06-13 MED ORDER — BUPIVACAINE HCL (PF) 0.5 % IJ SOLN
INTRAMUSCULAR | Status: AC
Start: 2021-06-13 — End: ?
  Filled 2021-06-13: qty 30

## 2021-06-13 MED ORDER — MIDAZOLAM HCL 2 MG/2ML IJ SOLN
INTRAMUSCULAR | Status: AC
  Filled 2021-06-13: qty 2

## 2021-06-13 MED ORDER — HYDROCODONE-ACETAMINOPHEN 5-325 MG PO TABS
1.0000 | ORAL_TABLET | Freq: Four times a day (QID) | ORAL | 0 refills | Status: DC | PRN
Start: 2021-06-13 — End: 2021-08-08

## 2021-06-13 MED ORDER — POVIDONE-IODINE 7.5 % EX SOLN
Freq: Once | CUTANEOUS | Status: DC
  Filled 2021-06-13: qty 118

## 2021-06-13 MED ORDER — LIDOCAINE 2% (20 MG/ML) 5 ML SYRINGE
INTRAMUSCULAR | Status: DC | PRN
  Administered 2021-06-13: 60 mg via INTRAVENOUS

## 2021-06-13 SURGICAL SUPPLY — 38 items
BAG COUNTER SPONGE SURGICOUNT (BAG) ×3 IMPLANT
BAG SPNG CNTER NS LX DISP (BAG) ×1
BANDAGE ESMARK 6X9 LF (GAUZE/BANDAGES/DRESSINGS) IMPLANT
BLADE SURG 15 STRL LF DISP TIS (BLADE) ×2 IMPLANT
BLADE SURG 15 STRL SS (BLADE) ×2
BNDG CMPR 9X6 STRL LF SNTH (GAUZE/BANDAGES/DRESSINGS) ×1
BNDG ESMARK 6X9 LF (GAUZE/BANDAGES/DRESSINGS) ×2
CANISTER SUCT 3000ML PPV (MISCELLANEOUS) ×3 IMPLANT
CLSR STERI-STRIP ANTIMIC 1/2X4 (GAUZE/BANDAGES/DRESSINGS) ×1 IMPLANT
CUFF TOURN SGL QUICK 34 (TOURNIQUET CUFF) ×2
CUFF TRNQT CYL 34X4.125X (TOURNIQUET CUFF) IMPLANT
DRAPE ORTHO SPLIT 77X108 STRL (DRAPES) ×4
DRAPE SURG ORHT 6 SPLT 77X108 (DRAPES) ×4 IMPLANT
DRAPE U-SHAPE 47X51 STRL (DRAPES) ×3 IMPLANT
DRSG MEPILEX BORDER 4X12 (GAUZE/BANDAGES/DRESSINGS) ×1 IMPLANT
DURAPREP 26ML APPLICATOR (WOUND CARE) ×4 IMPLANT
ELECT REM PT RETURN 9FT ADLT (ELECTROSURGICAL) ×2
ELECTRODE REM PT RTRN 9FT ADLT (ELECTROSURGICAL) ×2 IMPLANT
GLOVE BIO SURGEON STRL SZ7 (GLOVE) ×6 IMPLANT
GLOVE BIOGEL PI IND STRL 7.0 (GLOVE) ×2 IMPLANT
GLOVE BIOGEL PI INDICATOR 7.0 (GLOVE) ×1
GLOVE ORTHO TXT STRL SZ7.5 (GLOVE) ×3 IMPLANT
GOWN STRL REUS W/ TWL XL LVL3 (GOWN DISPOSABLE) ×2 IMPLANT
GOWN STRL REUS W/TWL 2XL LVL3 (GOWN DISPOSABLE) ×3 IMPLANT
GOWN STRL REUS W/TWL XL LVL3 (GOWN DISPOSABLE) ×2
KIT BASIN OR (CUSTOM PROCEDURE TRAY) ×3 IMPLANT
NDL HYPO 25GX1X1/2 BEV (NEEDLE) IMPLANT
NEEDLE HYPO 25GX1X1/2 BEV (NEEDLE) ×2 IMPLANT
NS IRRIG 1000ML POUR BTL (IV SOLUTION) ×3 IMPLANT
PACK GENERAL/GYN (CUSTOM PROCEDURE TRAY) ×3 IMPLANT
SHEET MEDIUM DRAPE 40X70 STRL (DRAPES) ×3 IMPLANT
SPONGE T-LAP 4X18 ~~LOC~~+RFID (SPONGE) ×3 IMPLANT
SUT VIC AB 2-0 SH 27 (SUTURE) ×2
SUT VIC AB 2-0 SH 27XBRD (SUTURE) IMPLANT
SUT VICRYL 3-0 CR8 SH (SUTURE) ×3 IMPLANT
SYR CONTROL 10ML LL (SYRINGE) ×1 IMPLANT
UNDERPAD 30X36 HEAVY ABSORB (UNDERPADS AND DIAPERS) ×3 IMPLANT
WATER STERILE IRR 1000ML POUR (IV SOLUTION) ×3 IMPLANT

## 2021-06-13 NOTE — H&P (Signed)
PREOPERATIVE H&P  Chief Complaint: right knee pain  HPI: Alex Morrison is a 51 y.o. male with a history of a right knee tibial plateau fracture s/p ORIF in 2021. He would like to get his knee replaced and he needs to get the hardware out.  Pain is rated 9/10. Symptoms are rated as moderate to severe, and have been worsening.  This is significantly impairing activities of daily living.  He has elected for surgical management.   Past Medical History:  Diagnosis Date   Abscess of buttock    Acute kidney injury ([place]) 09/27/2013   Depression    Diabetes mellitus without complication ([place])    Diabetic neuropathy ([place])    Dialysis patient ([place])    M,W,F   ESRD (end stage renal disease) ([place])    Glaucoma    HTN (hypertension) 08/08/2018   Obesity    Perforated appendix    Peritonitis   Psoriasis    Retinopathy    Past Surgical History:  Procedure Laterality Date   ABSCESS      RECTAL    APPENDECTOMY     AV FISTULA PLACEMENT Left 01/13/2019   Procedure: LEFT ARM ARTERIOVENOUS (AV) FISTULA CREATION;  Surgeon: Elam Dutch, MD;  Location: [facility];  Service: Vascular;  Laterality: Left;   CATARACT EXTRACTION     Ex lap with ileocecectomy  09/27/13   Dr. Brantley Stage   ILEOCECETOMY     INCISION AND DRAINAGE     INCISIONAL HERNIA REPAIR N/A 08/11/2018   Procedure: OPEN RETRORECTUS REPAIR OF INCARCERATED INCISIONAL HERNIA WITH BILATERAL POSTERIOR COMPONENT SEPARATION;  Surgeon: [name], MD;  Location: [facility];  Service: General;  Laterality: N/A;   INSERTION OF MESH  08/11/2018   Procedure: INSERTION OF PHASIX MESH;  Surgeon: [name], MD;  Location: [facility];  Service: General;;   insertion of PD catheter  12/2017   IR FLUORO GUIDE CV LINE RIGHT  08/08/2018   IR US GUIDE VASC ACCESS RIGHT  08/08/2018   LAPAROSCOPIC ABDOMINAL EXPLORATION N/A 09/27/2013   LAPAROSCOPIC ASSISTED VENTRAL HERNIA REPAIR  2019   LAPAROSCOPY N/A 09/27/2013   Procedure: LAPAROSCOPY DIAGNOSTIC;  Surgeon: Erroll Luna, MD;  Location: [facility];  Service: General;  Laterality: N/A;   LAPAROTOMY  08/11/2018   Procedure: EXPLORATORY LAPAROTOMY;  Surgeon: [name], MD;  Location: [facility];  Service: General;;   LYSIS OF ADHESION  08/11/2018   Procedure: LYSIS OF ADHESIONS X 30 MINUTES;  Surgeon: [name], MD;  Location: [facility];  Service: General;;   MINOR REMOVAL OF PERITONEAL DIALYSIS CATHETER N/A 01/06/2019   Procedure: REMOVAL OF PERITONEAL DIALYSIS CATHETER;  Surgeon: Kieth Brightly Arta Bruce, MD;  [facility];  Service: General;  Laterality: N/A;   OPEN REDUCTION INTERNAL FIXATION (ORIF) TIBIA/FIBULA FRACTURE Right 03/07/2019   Procedure: OPEN REDUCTION INTERNAL FIXATION (ORIF) FIBULA FRACTURE;  Surgeon: Marchia Bond, MD;  Location: [facility];  Service: Orthopedics;  Laterality: Right;   ORIF TIBIA PLATEAU Right 03/07/2019   Procedure: Open Reduction Internal Fixation (Orif) Tibial Plateau;  Surgeon: Marchia Bond, MD;  Location: [facility];  Service: Orthopedics;  Laterality: Right;   SHOULDER SURGERY  1989   Social History   Socioeconomic History   Marital status: Single    Spouse name: Not on file   Number of children: Not on file   Years of education: Not on file   Highest education level: Not on file  Occupational History   Not on file  Tobacco Use  Smoking status: Never   Smokeless tobacco: Never  Vaping Use   Vaping Use: Never used  Substance and Sexual Activity   Alcohol use: No   Drug use: No   Sexual activity: Not Currently  Other Topics Concern   Not on file  Social History Narrative   Not on file   Social Determinants of Health   Financial Resource Strain: Not on file  Food Insecurity: Not on file  Transportation Needs: Not on file  Physical Activity: Not on file  Stress: Not on file  Social Connections: Not on file   Family History  Problem Relation Age of Onset   Diabetes Mellitus II Mother    Heart disease Mother    Diabetes Mother    Diabetes Mellitus I Father     Diabetes Father    Diabetes Mellitus II Brother    Colon cancer Neg Hx    Esophageal cancer Neg Hx    Pancreatic cancer Neg Hx    Stomach cancer Neg Hx    No Known Allergies Prior to Admission medications   Medication Sig Start Date End Date Taking? Authorizing Provider  amLODipine (NORVASC) 10 MG tablet Take 10 mg by mouth daily.   Yes [provider]  insulin aspart (NOVOLOG) 100 UNIT/ML FlexPen Inject 3-9 Units into the skin 3 (three) times daily as needed for high blood sugar.   Yes [provider]  insulin glargine (LANTUS) 100 UNIT/ML Solostar Pen Inject 8 Units into the skin at bedtime.   Yes [provider]  sevelamer carbonate (RENVELA) 800 MG tablet Take 800-2,400 mg by mouth See admin instructions. Take 3 tablets (2400 mg) by mouth two times daily with meals, take 1-2 tablets ([phone] mg) with snacks. 01/20/19  Yes [provider]     Positive ROS: All other systems have been reviewed and were otherwise negative with the exception of those mentioned in the HPI and as above.  Physical Exam: General: Alert, no acute distress Cardiovascular: No pedal edema Respiratory: No cyanosis, no use of accessory musculature GI: No organomegaly, abdomen is soft and non-tender Skin: No lesions in the area of chief complaint Neurologic: Sensation intact distally Psychiatric: Patient is competent for consent with normal mood and affect Lymphatic: No axillary or cervical lymphadenopathy  MUSCULOSKELETAL: Right knee has a fairly massive effusion.  No redness.  Well healed surgical wounds.  Range of motion from 0-95 degrees.    Assessment: Posttraumatic arthrosis, right tibial plateau   Plan: Plan for Procedure(s): HARDWARE REMOVAL  The risks benefits and alternatives were discussed with the patient including but not limited to the risks of nonoperative treatment, versus surgical intervention including infection, bleeding, nerve injury,  blood clots,  cardiopulmonary complications, morbidity, mortality, among others, and they were willing to proceed.      Ventura Bruns, PA-C   06/13/2021 6:54 AM

## 2021-06-13 NOTE — Anesthesia Procedure Notes (Signed)
Procedure Name: LMA Insertion Date/Time: 06/13/2021 7:41 AM Performed by: Vonna Drafts, CRNA Pre-anesthesia Checklist: Patient identified, Emergency Drugs available, Suction available, Patient being monitored and Timeout performed Patient Re-evaluated:Patient Re-evaluated prior to induction Oxygen Delivery Method: Circle system utilized Preoxygenation: Pre-oxygenation with 100% oxygen Induction Type: IV induction Ventilation: Mask ventilation without difficulty LMA: LMA inserted LMA Size: 4.0 Number of attempts: 1 Tube secured with: Tape Dental Injury: Teeth and Oropharynx as per pre-operative assessment

## 2021-06-13 NOTE — Interval H&P Note (Signed)
History and Physical Interval Note:  06/13/2021 7:15 AM  Alex Morrison  has presented today for surgery, with the diagnosis of PAINFUL HARDWARE RIGHT KNEE.  The various methods of treatment have been discussed with the patient and family. After consideration of risks, benefits and other options for treatment, the patient has consented to  Procedure(s): HARDWARE REMOVAL (Right) as a surgical intervention.  The patient's history has been reviewed, patient examined, no change in status, stable for surgery.  I have reviewed the patient's chart and labs.  Questions were answered to the patient's satisfaction.  This is the first step in the plan to ultimately get him a TKA.     Johnny Bridge

## 2021-06-13 NOTE — Transfer of Care (Signed)
Immediate Anesthesia Transfer of Care Note  Patient: Alex Morrison  Procedure(s) Performed: HARDWARE REMOVAL (Right: Knee)  Patient Location: PACU  Anesthesia Type:General  Level of Consciousness: drowsy  Airway & Oxygen Therapy: Patient Spontanous Breathing and Patient connected to face mask oxygen  Post-op Assessment: Report given to RN and Post -op Vital signs reviewed and stable  Post vital signs: Reviewed and stable  Last Vitals:  Vitals Value Taken Time  BP    Temp    Pulse    Resp    SpO2      Last Pain:  Vitals:   06/13/21 0611  TempSrc:   PainSc: 0-No pain         Complications: No notable events documented.

## 2021-06-13 NOTE — Discharge Instructions (Signed)
Diet: As you were doing prior to hospitalization   Shower:  May shower but keep the wounds dry, use an occlusive plastic wrap, NO SOAKING IN TUB.  If the bandage gets wet, change with a clean dry gauze.  If you have a splint on, leave the splint in place and keep the splint dry with a plastic bag.  Dressing:  You may change your dressing 3-5 days after surgery, unless you have a splint.  If you have a splint, then just leave the splint in place and we will change your bandages during your first follow-up appointment.    If you had hand or foot surgery, we will plan to remove your stitches in about 2 weeks in the office.  For all other surgeries, there are sticky tapes (steri-strips) on your wounds and all the stitches are absorbable.  Leave the steri-strips in place when changing your dressings, they will peel off with time, usually 2-3 weeks.  Activity:  Increase activity slowly as tolerated, but follow the weight bearing instructions below.  The rules on driving is that you can not be taking narcotics while you drive, and you must feel in control of the vehicle.    Weight Bearing:  weight bearing as tolerated on right leg  To prevent constipation: you may use a stool softener such as -  Colace (over the counter) 100 mg by mouth twice a day  Drink plenty of fluids (prune juice may be helpful) and high fiber foods Miralax (over the counter) for constipation as needed.    Itching:  If you experience itching with your medications, try taking only a single pain pill, or even half a pain pill at a time.  You may take up to 10 pain pills per day, and you can also use benadryl over the counter for itching or also to help with sleep.   Precautions:  If you experience chest pain or shortness of breath - call 911 immediately for transfer to the hospital emergency department!!  If you develop a fever greater that 101 F, purulent drainage from wound, increased redness or drainage from wound, or calf pain  -- Call the office at [phone]                                                Follow- Up Appointment:  Please call for an appointment to be seen in 2 weeks [place] - [phone]     

## 2021-06-13 NOTE — Op Note (Signed)
06/13/2021  9:05 AM  PATIENT:  Alex Morrison    PRE-OPERATIVE DIAGNOSIS: Right proximal tibia retained hardware  POST-OPERATIVE DIAGNOSIS:  Same  PROCEDURE: Removal of hardware, right proximal tibia, deep  SURGEON:  Johnny Bridge, MD  PHYSICIAN ASSISTANT: Merlene Pulling, PA-C, present and scrubbed throughout the case, critical for completion in a timely fashion, and for retraction, instrumentation, and closure.  ANESTHESIA:   General  PREOPERATIVE INDICATIONS:  Alex Morrison is a  51 y.o. male who had a right tibial plateau fracture with significant displacement.  Ultimately his plateau fracture subsided and collapsed, and he is likely going to need total knee replacement.  We are planning for removal of hardware as a staged portion of the knee replacement.  This will hopefully optimize the recovery, as well as risk profile, he is already high risk given his kidney dysfunction, on dialysis.  The risks benefits and alternatives were discussed with the patient preoperatively including but not limited to the risks of infection, bleeding, nerve injury, cardiopulmonary complications, the need for revision surgery, among others, and the patient was willing to proceed.  ESTIMATED BLOOD LOSS: 75 mL  OPERATIVE IMPLANTS: I removed a total of 9 screws and 1 tibia locking plate  OPERATIVE FINDINGS: He had substantial fluid excess extravasation from the joint, even though I did not go into the joint but just by simply exposing the tibial plate I think that there was communications through the collapse of the fracture that allowed for his substantial effusion to distribute into the wound.  OPERATIVE PROCEDURE: The patient was brought to the operating room and placed in the supine position.  General anesthesia was administered.  The right lower extremity was prepped and draped in usual sterile fashion.  Timeout was performed.  Leg was elevated and exsanguinated and the tourniquet was inflated.  He  had substantial varus valgus instability.  Incision was made through his previous incision, and dissection was carried down and the plate exposed and the screws removed without significant difficulty.  The plate was then removed.  I also did remove the small FiberWire suture that was binding the plate to the meniscal attachment.  The wounds were irrigated copiously, and repaired with Vicryl followed by Vicryl for the subcutaneous tissue with sterile gauze.  The tourniquet was released prior to closure and excellent hemostasis was obtained.  We will plan for staged total knee replacement in the next 4 to 6 weeks.

## 2021-06-13 NOTE — Anesthesia Postprocedure Evaluation (Signed)
Anesthesia Post Note  Patient: Alex Morrison  Procedure(s) Performed: HARDWARE REMOVAL (Right: Knee)     Patient location during evaluation: PACU Anesthesia Type: General Level of consciousness: awake and alert Pain management: pain level controlled Vital Signs Assessment: post-procedure vital signs reviewed and stable Respiratory status: spontaneous breathing, nonlabored ventilation and respiratory function stable Cardiovascular status: blood pressure returned to baseline and stable Postop Assessment: no apparent nausea or vomiting Anesthetic complications: no   No notable events documented.  Last Vitals:  Vitals:   06/13/21 0930 06/13/21 0945  BP: (!) 143/77 (!) 160/81  Pulse: 67 65  Resp: 15 14  Temp:    SpO2: 100% 96%    Last Pain:  Vitals:   06/13/21 0945  TempSrc:   PainSc: 0-No pain                 Quinnlyn Hearns,W. EDMOND

## 2021-06-14 ENCOUNTER — Encounter (HOSPITAL_COMMUNITY): Payer: Self-pay | Admitting: Orthopedic Surgery

## 2021-08-01 NOTE — Pre-Procedure Instructions (Signed)
Surgical Instructions    Your procedure is scheduled on Tuesday, July 25th.  Report to [facility] Main Entrance "A" at 05:30 A.M., then check in with the Admitting office.  Call this number if you have problems the morning of surgery:  [phone]   If you have any questions prior to your surgery date call [phone]: Open Monday-Friday 8am-4pm    Remember:  Do not eat after midnight the night before your surgery  You may drink clear liquids until 04:30 AM the morning of your surgery.   Clear liquids allowed are: Water, Non-Citrus Juices (without pulp), Carbonated Beverages, Clear Tea, Black Coffee Only (NO MILK, CREAM OR POWDERED CREAMER of any kind), and Gatorade.   Patient Instructions  The night before surgery:  No food after midnight. ONLY clear liquids after midnight    The day of surgery (if you have diabetes): Drink ONE (1) 12 oz G2 given to you in your pre admission testing appointment by 04:30 AM the morning of surgery. Drink in one sitting. Do not sip.  This drink was given to you during your hospital  pre-op appointment visit.  Nothing else to drink after completing the  12 oz bottle of G2.         If you have questions, please contact your surgeon's office.      Take these medicines the morning of surgery with A SIP OF WATER  amLODipine (NORVASC)  HYDROcodone-acetaminophen (NORCO)- if needed ondansetron (ZOFRAN)- if needed   WHAT DO I DO ABOUT MY DIABETES MEDICATION?   THE NIGHT BEFORE SURGERY (7/24), do not take bedtime dose of insulin aspart (NOVOLOG). Take 4 units (50%) of insulin glargine (LANTUS) at bedtime.      THE MORNING OF SURGERY, do not take insulin aspart (NOVOLOG) unless your CBG is greater than 220. Then, take 1/2 of your usual dose (2-3 units).    HOW TO MANAGE YOUR DIABETES BEFORE AND AFTER SURGERY  Why is it important to control my blood sugar before and after surgery? Improving blood sugar levels before and after surgery helps  healing and can limit problems. A way of improving blood sugar control is eating a healthy diet by:  Eating less sugar and carbohydrates  Increasing activity/exercise  Talking with your doctor about reaching your blood sugar goals High blood sugars (greater than 180 mg/dL) can raise your risk of infections and slow your recovery, so you will need to focus on controlling your diabetes during the weeks before surgery. Make sure that the doctor who takes care of your diabetes knows about your planned surgery including the date and location.  How do I manage my blood sugar before surgery? Check your blood sugar at least 4 times a day, starting 2 days before surgery, to make sure that the level is not too high or low.  Check your blood sugar the morning of your surgery when you wake up and every 2 hours until you get to the Short Stay unit.  If your blood sugar is less than 70 mg/dL, you will need to treat for low blood sugar: Do not take insulin. Treat a low blood sugar (less than 70 mg/dL) with  cup of clear juice (cranberry or apple), 4 glucose tablets, OR glucose gel. Recheck blood sugar in 15 minutes after treatment (to make sure it is greater than 70 mg/dL). If your blood sugar is not greater than 70 mg/dL on recheck, call [phone] for further instructions. Report your blood sugar to the short stay nurse  when you get to Short Stay.  If you are admitted to the hospital after surgery: Your blood sugar will be checked by the staff and you will probably be given insulin after surgery (instead of oral diabetes medicines) to make sure you have good blood sugar levels. The goal for blood sugar control after surgery is 80-180 mg/dL.    As of today, STOP taking any Aspirin (unless otherwise instructed by your surgeon) Aleve, Naproxen, Ibuprofen, Motrin, Advil, Goody's, BC's, all herbal medications, fish oil, and all vitamins.                     Do NOT Smoke (Tobacco/Vaping) for 24 hours  prior to your procedure.  If you use a CPAP at night, you may bring your mask/headgear for your overnight stay.   Contacts, glasses, piercing's, hearing aid's, dentures or partials may not be worn into surgery, please bring cases for these belongings.    For patients admitted to the hospital, discharge time will be determined by your treatment team.   Patients discharged the day of surgery will not be allowed to drive home, and someone needs to stay with them for 24 hours.  SURGICAL WAITING ROOM VISITATION Patients having surgery or a procedure may have no more than 2 support people in the waiting area - these visitors may rotate.   Children under the age of 40 must have an adult with them who is not the patient. If the patient needs to stay at the hospital during part of their recovery, the visitor guidelines for inpatient rooms apply. Pre-op nurse will coordinate an appropriate time for 1 support person to accompany patient in pre-op.  This support person may not rotate.   Please refer to the [facility] website for the visitor guidelines for Inpatients (after your surgery is over and you are in a regular room).    Special instructions:   [place]- Preparing For Surgery  Before surgery, you can play an important role. Because skin is not sterile, your skin needs to be as free of germs as possible. You can reduce the number of germs on your skin by washing with CHG (chlorahexidine gluconate) Soap before surgery.  CHG is an antiseptic cleaner which kills germs and bonds with the skin to continue killing germs even after washing.    Oral Hygiene is also important to reduce your risk of infection.  Remember - BRUSH YOUR TEETH THE MORNING OF SURGERY WITH YOUR REGULAR TOOTHPASTE  Please do not use if you have an allergy to CHG or antibacterial soaps. If your skin becomes reddened/irritated stop using the CHG.  Do not shave (including legs and underarms) for at least 48 hours prior to first  CHG shower. It is OK to shave your face.  Please follow these instructions carefully.   Shower the NIGHT BEFORE SURGERY and the MORNING OF SURGERY  If you chose to wash your hair, wash your hair first as usual with your normal shampoo.  After you shampoo, rinse your hair and body thoroughly to remove the shampoo.  Use CHG Soap as you would any other liquid soap. You can apply CHG directly to the skin and wash gently with a scrungie or a clean washcloth.   Apply the CHG Soap to your body ONLY FROM THE NECK DOWN.  Do not use on open wounds or open sores. Avoid contact with your eyes, ears, mouth and genitals (private parts). Wash Face and genitals (private parts)  with your normal  soap.   Wash thoroughly, paying special attention to the area where your surgery will be performed.  Thoroughly rinse your body with warm water from the neck down.  DO NOT shower/wash with your normal soap after using and rinsing off the CHG Soap.  Pat yourself dry with a CLEAN TOWEL.  Wear CLEAN PAJAMAS to bed the night before surgery  Place CLEAN SHEETS on your bed the night before your surgery  DO NOT SLEEP WITH PETS.   Day of Surgery: Take a shower with CHG soap. Do not wear jewelry  Do not wear lotions, powders, colognes, or deodorant. Men may shave face and neck. Do not bring valuables to the hospital. [facility] is not responsible for any belongings or valuables.  Wear Clean/Comfortable clothing the morning of surgery Remember to brush your teeth WITH YOUR REGULAR TOOTHPASTE.   Please read over the following fact sheets that you were given.    If you received a COVID test during your pre-op visit  it is requested that you wear a mask when out in public, stay away from anyone that may not be feeling well and notify your surgeon if you develop symptoms. If you have been in contact with anyone that has tested positive in the last 10 days please notify you surgeon.

## 2021-08-02 ENCOUNTER — Encounter (HOSPITAL_COMMUNITY): Payer: Self-pay

## 2021-08-02 ENCOUNTER — Other Ambulatory Visit: Payer: Self-pay

## 2021-08-02 ENCOUNTER — Encounter (HOSPITAL_COMMUNITY)
Admission: RE | Admit: 2021-08-02 | Discharge: 2021-08-02 | Disposition: A | Discharge status: Home / Self Care | Payer: BC Managed Care – PPO | Source: Ambulatory Visit | Attending: Orthopedic Surgery | Admitting: Orthopedic Surgery

## 2021-08-02 VITALS — BP 140/65 | HR 75 | Temp 98.2°F | Resp 17 | Ht 71.0 in | Wt 201.1 lb

## 2021-08-02 DIAGNOSIS — Z01818 Encounter for other preprocedural examination: Secondary | ICD-10-CM

## 2021-08-02 DIAGNOSIS — Z794 Long term (current) use of insulin: Secondary | ICD-10-CM | POA: Diagnosis not present

## 2021-08-02 DIAGNOSIS — E119 Type 2 diabetes mellitus without complications: Secondary | ICD-10-CM | POA: Diagnosis not present

## 2021-08-02 DIAGNOSIS — Z01812 Encounter for preprocedural laboratory examination: Secondary | ICD-10-CM | POA: Insufficient documentation

## 2021-08-02 LAB — HEMOGLOBIN A1C
Hgb A1c MFr Bld: 6.2 % — ABNORMAL HIGH (ref 4.8–5.6)
Mean Plasma Glucose: 131.24 mg/dL

## 2021-08-02 LAB — SURGICAL PCR SCREEN
MRSA, PCR: NEGATIVE
Staphylococcus aureus: NEGATIVE

## 2021-08-02 LAB — GLUCOSE, CAPILLARY: Glucose-Capillary: 138 mg/dL — ABNORMAL HIGH (ref 70–99)

## 2021-08-02 NOTE — Progress Notes (Signed)
PCP - Dr. Domenick Gong Cardiologist - denies  PPM/ICD - denies   Chest x-ray - 07/20/20 EKG - 06/05/21 Stress Test - 7-8 years ago per pt, no abnormalities ECHO - denies Cardiac Cath - denies  Sleep Study - denies  DM- Type 2 Fasting Blood Sugar - 140-150 Checks Blood Sugar 2 times a day  ASA/Blood Thinner Instructions: n/a   ERAS Protcol - yes PRE-SURGERY  G2- given at PAT  COVID TEST- n/a   Anesthesia review: no  Patient denies shortness of breath, fever, cough and chest pain at PAT appointment   All instructions explained to the patient, with a verbal understanding of the material. Patient agrees to go over the instructions while at home for a better understanding.  The opportunity to ask questions was provided.

## 2021-08-07 NOTE — Anesthesia Preprocedure Evaluation (Signed)
Anesthesia Evaluation  Patient identified by MRN, date of birth, ID band Patient awake    Reviewed: Allergy & Precautions, NPO status , Patient's Chart, lab work & pertinent test results  Airway Mallampati: II  TM Distance: >3 FB Neck ROM: Full    Dental no notable dental hx.    Pulmonary neg pulmonary ROS,    Pulmonary exam normal        Cardiovascular hypertension, Pt. on medications  Rhythm:Regular Rate:Normal     Neuro/Psych Depression negative neurological ROS     GI/Hepatic negative GI ROS, Neg liver ROS,   Endo/Other  diabetes, Type 2, Insulin Dependent  Renal/GU ESRF and DialysisRenal disease  negative genitourinary   Musculoskeletal  (+) Arthritis , Osteoarthritis,    Abdominal Normal abdominal exam  (+)   Peds  Hematology negative hematology ROS (+)   Anesthesia Other Findings   Reproductive/Obstetrics                            Anesthesia Physical Anesthesia Plan  ASA: 2  Anesthesia Plan: MAC, Regional and Spinal   Post-op Pain Management: Regional block*, Celebrex PO (pre-op)* and Tylenol PO (pre-op)*   Induction: Intravenous  PONV Risk Score and Plan: 1 and Ondansetron, Dexamethasone, Propofol infusion, Midazolam and Treatment may vary due to age or medical condition  Airway Management Planned: Simple Face Mask, Natural Airway and Nasal Cannula  Additional Equipment: None  Intra-op Plan:   Post-operative Plan:   Informed Consent: I have reviewed the patients History and Physical, chart, labs and discussed the procedure including the risks, benefits and alternatives for the proposed anesthesia with the patient or authorized representative who has indicated his/her understanding and acceptance.     Dental advisory given  Plan Discussed with: CRNA  Anesthesia Plan Comments: (Lab Results      Component                Value               Date                       WBC                      8.3                 07/20/2020                HGB                      9.9 (L)             06/13/2021                HCT                      29.0 (L)            06/13/2021                MCV                      91.7                07/20/2020                PLT  206                 07/20/2020           Lab Results      Component                Value               Date                      NA                       136                 06/13/2021                K                        4.4                 06/13/2021                CO2                      32                  07/20/2020                GLUCOSE                  180 (H)             06/13/2021                BUN                      40 (H)              06/13/2021                CREATININE               6.60 (H)            06/13/2021                CALCIUM                  9.5                 07/20/2020                GFRNONAA                 12 (L)              07/20/2020          )       Anesthesia Quick Evaluation

## 2021-08-08 ENCOUNTER — Encounter (HOSPITAL_COMMUNITY)
Admission: RE | Disposition: A | Discharge status: Home / Self Care | Payer: Self-pay | Source: Home / Self Care | Attending: Orthopedic Surgery

## 2021-08-08 ENCOUNTER — Observation Stay (HOSPITAL_COMMUNITY)
Admission: RE | Admit: 2021-08-08 | Discharge: 2021-08-08 | Disposition: A | Discharge status: Home / Self Care | Payer: Medicare Other | Attending: Orthopedic Surgery | Admitting: Orthopedic Surgery

## 2021-08-08 ENCOUNTER — Ambulatory Visit (HOSPITAL_BASED_OUTPATIENT_CLINIC_OR_DEPARTMENT_OTHER): Payer: Medicare Other | Admitting: Anesthesiology

## 2021-08-08 ENCOUNTER — Other Ambulatory Visit: Payer: Self-pay

## 2021-08-08 ENCOUNTER — Observation Stay (HOSPITAL_COMMUNITY): Payer: Medicare Other

## 2021-08-08 ENCOUNTER — Ambulatory Visit (HOSPITAL_COMMUNITY): Payer: Medicare Other | Admitting: Anesthesiology

## 2021-08-08 ENCOUNTER — Encounter (HOSPITAL_COMMUNITY): Payer: Self-pay | Admitting: Orthopedic Surgery

## 2021-08-08 DIAGNOSIS — Z8781 Personal history of (healed) traumatic fracture: Secondary | ICD-10-CM | POA: Insufficient documentation

## 2021-08-08 DIAGNOSIS — E1140 Type 2 diabetes mellitus with diabetic neuropathy, unspecified: Secondary | ICD-10-CM | POA: Diagnosis not present

## 2021-08-08 DIAGNOSIS — Z794 Long term (current) use of insulin: Secondary | ICD-10-CM | POA: Diagnosis not present

## 2021-08-08 DIAGNOSIS — N186 End stage renal disease: Secondary | ICD-10-CM | POA: Diagnosis not present

## 2021-08-08 DIAGNOSIS — M1731 Unilateral post-traumatic osteoarthritis, right knee: Principal | ICD-10-CM | POA: Insufficient documentation

## 2021-08-08 DIAGNOSIS — Z992 Dependence on renal dialysis: Secondary | ICD-10-CM | POA: Diagnosis not present

## 2021-08-08 DIAGNOSIS — E1122 Type 2 diabetes mellitus with diabetic chronic kidney disease: Secondary | ICD-10-CM | POA: Diagnosis not present

## 2021-08-08 DIAGNOSIS — M1710 Unilateral primary osteoarthritis, unspecified knee: Secondary | ICD-10-CM | POA: Diagnosis present

## 2021-08-08 DIAGNOSIS — M23632 Other spontaneous disruption of medial collateral ligament of left knee: Secondary | ICD-10-CM | POA: Insufficient documentation

## 2021-08-08 DIAGNOSIS — I120 Hypertensive chronic kidney disease with stage 5 chronic kidney disease or end stage renal disease: Secondary | ICD-10-CM | POA: Diagnosis not present

## 2021-08-08 DIAGNOSIS — Z79899 Other long term (current) drug therapy: Secondary | ICD-10-CM | POA: Insufficient documentation

## 2021-08-08 DIAGNOSIS — M1711 Unilateral primary osteoarthritis, right knee: Secondary | ICD-10-CM | POA: Diagnosis not present

## 2021-08-08 DIAGNOSIS — Z96651 Presence of right artificial knee joint: Secondary | ICD-10-CM

## 2021-08-08 HISTORY — PX: TOTAL KNEE ARTHROPLASTY: SHX125

## 2021-08-08 LAB — GLUCOSE, CAPILLARY
Glucose-Capillary: 171 mg/dL — ABNORMAL HIGH (ref 70–99)
Glucose-Capillary: 140 mg/dL — ABNORMAL HIGH (ref 70–99)

## 2021-08-08 LAB — POCT I-STAT, CHEM 8
BUN: 36 mg/dL — ABNORMAL HIGH (ref 6–20)
Calcium, Ion: 1.02 mmol/L — ABNORMAL LOW (ref 1.15–1.40)
Chloride: 94 mmol/L — ABNORMAL LOW (ref 98–111)
Creatinine, Ser: 6.7 mg/dL — ABNORMAL HIGH (ref 0.61–1.24)
Glucose, Bld: 168 mg/dL — ABNORMAL HIGH (ref 70–99)
HCT: 32 % — ABNORMAL LOW (ref 39.0–52.0)
Hemoglobin: 10.9 g/dL — ABNORMAL LOW (ref 13.0–17.0)
Potassium: 3.9 mmol/L (ref 3.5–5.1)
Sodium: 137 mmol/L (ref 135–145)
TCO2: 30 mmol/L (ref 22–32)

## 2021-08-08 SURGERY — ARTHROPLASTY, KNEE, TOTAL
Anesthesia: Monitor Anesthesia Care | Site: Knee | Laterality: Right

## 2021-08-08 MED ORDER — KETOROLAC TROMETHAMINE 30 MG/ML IJ SOLN
INTRAMUSCULAR | Status: DC | PRN
  Administered 2021-08-08: 30 mg

## 2021-08-08 MED ORDER — BUPIVACAINE HCL (PF) 0.25 % IJ SOLN
INTRAMUSCULAR | Status: DC | PRN
  Administered 2021-08-08: 30 mL

## 2021-08-08 MED ORDER — HYDROCODONE-ACETAMINOPHEN 7.5-325 MG PO TABS
ORAL_TABLET | ORAL | Status: AC
  Filled 2021-08-08: qty 2

## 2021-08-08 MED ORDER — HYDROCODONE-ACETAMINOPHEN 10-325 MG PO TABS: 1.0000 | ORAL_TABLET | ORAL | 0 refills | Status: AC | PRN

## 2021-08-08 MED ORDER — BUPIVACAINE IN DEXTROSE 0.75-8.25 % IT SOLN
INTRATHECAL | Status: DC | PRN
  Administered 2021-08-08: 2 mL via INTRATHECAL

## 2021-08-08 MED ORDER — INSULIN ASPART 100 UNIT/ML IJ SOLN: 0.0000 [IU] | INTRAMUSCULAR | Status: DC | PRN

## 2021-08-08 MED ORDER — ASPIRIN 81 MG PO TBEC: 81.0000 mg | DELAYED_RELEASE_TABLET | Freq: Two times a day (BID) | ORAL | 0 refills | Status: AC

## 2021-08-08 MED ORDER — POVIDONE-IODINE 10 % EX SWAB: 2.0000 | Freq: Once | CUTANEOUS | Status: DC

## 2021-08-08 MED ORDER — PROPOFOL 500 MG/50ML IV EMUL
INTRAVENOUS | Status: DC | PRN
  Administered 2021-08-08: 200 ug/kg/min via INTRAVENOUS

## 2021-08-08 MED ORDER — SURGIPHOR WOUND IRRIGATION SYSTEM - OPTIME
TOPICAL | Status: DC | PRN
  Administered 2021-08-08: 450 mL

## 2021-08-08 MED ORDER — ROPIVACAINE HCL 7.5 MG/ML IJ SOLN
INTRAMUSCULAR | Status: DC | PRN
  Administered 2021-08-08: 20 mL via PERINEURAL

## 2021-08-08 MED ORDER — POVIDONE-IODINE 7.5 % EX SOLN
Freq: Once | CUTANEOUS | Status: DC
  Filled 2021-08-08: qty 118

## 2021-08-08 MED ORDER — ORAL CARE MOUTH RINSE: 15.0000 mL | Freq: Once | OROMUCOSAL | Status: AC

## 2021-08-08 MED ORDER — CELECOXIB 200 MG PO CAPS: 200.0000 mg | ORAL_CAPSULE | Freq: Once | ORAL | Status: AC

## 2021-08-08 MED ORDER — LIDOCAINE 2% (20 MG/ML) 5 ML SYRINGE
INTRAMUSCULAR | Status: AC
Start: 2021-08-08 — End: ?
  Filled 2021-08-08: qty 5

## 2021-08-08 MED ORDER — 0.9 % SODIUM CHLORIDE (POUR BTL) OPTIME
TOPICAL | Status: DC | PRN
  Administered 2021-08-08: 1000 mL

## 2021-08-08 MED ORDER — CHLORHEXIDINE GLUCONATE 0.12 % MT SOLN
OROMUCOSAL | Status: AC
  Administered 2021-08-08: 15 mL via OROMUCOSAL
  Filled 2021-08-08: qty 15

## 2021-08-08 MED ORDER — CEFAZOLIN SODIUM-DEXTROSE 2-4 GM/100ML-% IV SOLN
2.0000 g | INTRAVENOUS | Status: AC
  Administered 2021-08-08: 2 g via INTRAVENOUS

## 2021-08-08 MED ORDER — SODIUM CHLORIDE 0.9 % IV SOLN: INTRAVENOUS | Status: DC | PRN

## 2021-08-08 MED ORDER — ACETAMINOPHEN 500 MG PO TABS: 1000.0000 mg | ORAL_TABLET | Freq: Once | ORAL | Status: AC

## 2021-08-08 MED ORDER — LACTATED RINGERS IV SOLN: INTRAVENOUS | Status: DC | PRN

## 2021-08-08 MED ORDER — HYDROCODONE-ACETAMINOPHEN 7.5-325 MG PO TABS
1.0000 | ORAL_TABLET | ORAL | Status: DC | PRN
  Administered 2021-08-08: 1 via ORAL

## 2021-08-08 MED ORDER — ONDANSETRON HCL 4 MG/2ML IJ SOLN
INTRAMUSCULAR | Status: DC | PRN
  Administered 2021-08-08: 4 mg via INTRAVENOUS

## 2021-08-08 MED ORDER — KETOROLAC TROMETHAMINE 30 MG/ML IJ SOLN
INTRAMUSCULAR | Status: AC
Start: 2021-08-08 — End: ?
  Filled 2021-08-08: qty 1

## 2021-08-08 MED ORDER — FENTANYL CITRATE (PF) 250 MCG/5ML IJ SOLN
INTRAMUSCULAR | Status: AC
  Filled 2021-08-08: qty 5

## 2021-08-08 MED ORDER — CELECOXIB 200 MG PO CAPS
ORAL_CAPSULE | ORAL | Status: AC
  Administered 2021-08-08: 200 mg via ORAL
  Filled 2021-08-08: qty 1

## 2021-08-08 MED ORDER — ACETAMINOPHEN 500 MG PO TABS
ORAL_TABLET | ORAL | Status: AC
  Administered 2021-08-08: 1000 mg via ORAL
  Filled 2021-08-08: qty 2

## 2021-08-08 MED ORDER — CEFAZOLIN SODIUM-DEXTROSE 2-4 GM/100ML-% IV SOLN
INTRAVENOUS | Status: AC
  Filled 2021-08-08: qty 100

## 2021-08-08 MED ORDER — HYDROCODONE-ACETAMINOPHEN 5-325 MG PO TABS: 1.0000 | ORAL_TABLET | ORAL | Status: DC | PRN

## 2021-08-08 MED ORDER — LACTATED RINGERS IV SOLN: INTRAVENOUS | Status: DC

## 2021-08-08 MED ORDER — FENTANYL CITRATE (PF) 250 MCG/5ML IJ SOLN
INTRAMUSCULAR | Status: DC | PRN
  Administered 2021-08-08: 100 ug via INTRAVENOUS
  Administered 2021-08-08: 50 ug via INTRAVENOUS

## 2021-08-08 MED ORDER — CHLORHEXIDINE GLUCONATE 0.12 % MT SOLN: 15.0000 mL | Freq: Once | OROMUCOSAL | Status: AC

## 2021-08-08 MED ORDER — SODIUM CHLORIDE 0.9 % IR SOLN
Status: DC | PRN
  Administered 2021-08-08: 1

## 2021-08-08 MED ORDER — MIDAZOLAM HCL 2 MG/2ML IJ SOLN
INTRAMUSCULAR | Status: DC | PRN
  Administered 2021-08-08: 2 mg via INTRAVENOUS

## 2021-08-08 MED ORDER — ONDANSETRON HCL 4 MG/2ML IJ SOLN
INTRAMUSCULAR | Status: AC
  Filled 2021-08-08: qty 2

## 2021-08-08 MED ORDER — MIDAZOLAM HCL 2 MG/2ML IJ SOLN
INTRAMUSCULAR | Status: AC
Start: 2021-08-08 — End: ?
  Filled 2021-08-08: qty 2

## 2021-08-08 MED ORDER — BUPIVACAINE HCL (PF) 0.25 % IJ SOLN
INTRAMUSCULAR | Status: AC
  Filled 2021-08-08: qty 30

## 2021-08-08 MED ORDER — ACETAMINOPHEN 500 MG PO TABS: 500.0000 mg | ORAL_TABLET | Freq: Four times a day (QID) | ORAL | Status: DC

## 2021-08-08 MED ORDER — PROPOFOL 10 MG/ML IV BOLUS
INTRAVENOUS | Status: AC
  Filled 2021-08-08: qty 20

## 2021-08-08 SURGICAL SUPPLY — 78 items
ARTICULAR SURF PSN CCK VE R 10 (Miscellaneous) ×2 IMPLANT
ARTISURF PSN CCK VE R 10 (Miscellaneous) IMPLANT
AUG TIB AB CONE CNTR STRL LF (Insert) ×1 IMPLANT
BAG COUNTER SPONGE SURGICOUNT (BAG) ×3 IMPLANT
BAG SPNG CNTER NS LX DISP (BAG) ×1
BANDAGE ESMARK 6X9 LF (GAUZE/BANDAGES/DRESSINGS) ×2 IMPLANT
BLADE SAG 18X100X1.27 (BLADE) ×6 IMPLANT
BLADE SAW SGTL 11.0X1.19X90.0M (BLADE) ×1 IMPLANT
BNDG CMPR 9X6 STRL LF SNTH (GAUZE/BANDAGES/DRESSINGS) ×1
BNDG CMPR MED 10X6 ELC LF (GAUZE/BANDAGES/DRESSINGS) ×1
BNDG ELASTIC 6X10 VLCR STRL LF (GAUZE/BANDAGES/DRESSINGS) ×3 IMPLANT
BNDG ESMARK 6X9 LF (GAUZE/BANDAGES/DRESSINGS) ×2
BOWL SMART MIX CTS (DISPOSABLE) ×3 IMPLANT
CEMENT BONE REFOBACIN R1X40 US (Cement) ×3 IMPLANT
CEMENT RESTRICTOR DEPUY SZ 6 (Cement) ×1 IMPLANT
CLSR STERI-STRIP ANTIMIC 1/2X4 (GAUZE/BANDAGES/DRESSINGS) ×3 IMPLANT
COMP FEM CMT PS PLUS 11 RT (Joint) ×2 IMPLANT
COMP TIB CMT PS KNEE H RT (Joint) ×2 IMPLANT
COMPONENT FEM CMT PS PLUS 11RT (Joint) IMPLANT
COMPONENT TIB CMT PS KNEE H RT (Joint) IMPLANT
COVER SURGICAL LIGHT HANDLE (MISCELLANEOUS) ×3 IMPLANT
CUFF TOURN SGL QUICK 34 (TOURNIQUET CUFF) ×2
CUFF TRNQT CYL 34X4.125X (TOURNIQUET CUFF) ×2 IMPLANT
DRAPE EXTREMITY T 121X128X90 (DISPOSABLE) ×3 IMPLANT
DRAPE HALF SHEET 40X57 (DRAPES) ×3 IMPLANT
DRAPE U-SHAPE 47X51 STRL (DRAPES) ×3 IMPLANT
DRSG MEPILEX BORDER 4X12 (GAUZE/BANDAGES/DRESSINGS) ×1 IMPLANT
DRSG MEPILEX BORDER 4X8 (GAUZE/BANDAGES/DRESSINGS) ×3 IMPLANT
DRSG PAD ABDOMINAL 8X10 ST (GAUZE/BANDAGES/DRESSINGS) ×1 IMPLANT
DURAPREP 26ML APPLICATOR (WOUND CARE) ×4 IMPLANT
ELECT CAUTERY BLADE 6.4 (BLADE) ×3 IMPLANT
ELECT REM PT RETURN 9FT ADLT (ELECTROSURGICAL) ×2
ELECTRODE REM PT RTRN 9FT ADLT (ELECTROSURGICAL) ×2 IMPLANT
GLOVE BIO SURGEON STRL SZ7 (GLOVE) ×6 IMPLANT
GLOVE BIOGEL PI IND STRL 7.0 (GLOVE) ×2 IMPLANT
GLOVE BIOGEL PI INDICATOR 7.0 (GLOVE) ×1
GLOVE ORTHO TXT STRL SZ7.5 (GLOVE) ×3 IMPLANT
GOWN STRL REUS W/ TWL LRG LVL3 (GOWN DISPOSABLE) ×2 IMPLANT
GOWN STRL REUS W/ TWL XL LVL3 (GOWN DISPOSABLE) ×2 IMPLANT
GOWN STRL REUS W/TWL LRG LVL3 (GOWN DISPOSABLE) ×2
GOWN STRL REUS W/TWL XL LVL3 (GOWN DISPOSABLE) ×2
HANDPIECE INTERPULSE COAX TIP (DISPOSABLE) ×2
HDLS TROCR DRIL PIN KNEE 75 (PIN) ×4
HOOD PEEL AWAY FLYTE STAYCOOL (MISCELLANEOUS) ×4 IMPLANT
INSERT TIB PS CMTLS CC FX (Insert) ×1 IMPLANT
KIT BASIN OR (CUSTOM PROCEDURE TRAY) ×3 IMPLANT
KIT TURNOVER KIT B (KITS) ×3 IMPLANT
MANIFOLD NEPTUNE II (INSTRUMENTS) ×3 IMPLANT
NDL 18GX1X1/2 (RX/OR ONLY) (NEEDLE) ×2 IMPLANT
NEEDLE 18GX1X1/2 (RX/OR ONLY) (NEEDLE) ×2 IMPLANT
NS IRRIG 1000ML POUR BTL (IV SOLUTION) ×3 IMPLANT
PACK TOTAL JOINT (CUSTOM PROCEDURE TRAY) ×3 IMPLANT
PAD ARMBOARD 7.5X6 YLW CONV (MISCELLANEOUS) ×6 IMPLANT
PIN DRILL HDLS TROCAR 75 4PK (PIN) IMPLANT
RESTRICTOR CEMENT SZ 5 C-STEM (Cement) ×1 IMPLANT
SCREW HEADED 33MM KNEE (MISCELLANEOUS) ×2 IMPLANT
SET HNDPC FAN SPRY TIP SCT (DISPOSABLE) ×2 IMPLANT
SET PAD KNEE POSITIONER (MISCELLANEOUS) ×1 IMPLANT
SOLUTION IRRIG SURGIPHOR (IV SOLUTION) ×1 IMPLANT
SPONGE T-LAP 18X18 ~~LOC~~+RFID (SPONGE) ×1 IMPLANT
STEM FEM EXT PERS 75X14 (Stem) ×2 IMPLANT
STEM POLY PAT PLY 35M KNEE (Knees) ×1 IMPLANT
SUCTION FRAZIER HANDLE 10FR (MISCELLANEOUS) ×2
SUCTION TUBE FRAZIER 10FR DISP (MISCELLANEOUS) ×2 IMPLANT
SUT MNCRL AB 3-0 PS2 27 (SUTURE) ×1 IMPLANT
SUT VIC AB 0 CT1 27 (SUTURE) ×2
SUT VIC AB 0 CT1 27XBRD ANBCTR (SUTURE) ×2 IMPLANT
SUT VIC AB 1 CT1 27 (SUTURE) ×4
SUT VIC AB 1 CT1 27XBRD ANTBC (SUTURE) IMPLANT
SUT VIC AB 2-0 CT1 27 (SUTURE) ×6
SUT VIC AB 2-0 CT1 TAPERPNT 27 (SUTURE) ×2 IMPLANT
SUT VIC AB 3-0 SH 8-18 (SUTURE) ×6 IMPLANT
SYR 30ML LL (SYRINGE) IMPLANT
SYR CONTROL 10ML LL (SYRINGE) ×3 IMPLANT
TOWEL GREEN STERILE (TOWEL DISPOSABLE) ×3 IMPLANT
TOWER CARTRIDGE SMART MIX (DISPOSABLE) ×1 IMPLANT
TRAY CATH 16FR W/PLASTIC CATH (SET/KITS/TRAYS/PACK) IMPLANT
WRAP KNEE MAXI GEL POST OP (GAUZE/BANDAGES/DRESSINGS) ×1 IMPLANT

## 2021-08-08 NOTE — Anesthesia Procedure Notes (Signed)
Procedure Name: MAC Date/Time: 08/08/2021 7:41 AM  Performed by: Claris Che, CRNAPre-anesthesia Checklist: Patient identified, Emergency Drugs available, Suction available, Patient being monitored and Timeout performed Oxygen Delivery Method: Simple face mask Ventilation: Oral airway inserted - appropriate to patient size Dental Injury: Teeth and Oropharynx as per pre-operative assessment

## 2021-08-08 NOTE — Anesthesia Procedure Notes (Signed)
Spinal  Patient location during procedure: OR Start time: 08/08/2021 7:38 AM End time: 08/08/2021 7:40 AM Staffing Performed: anesthesiologist  Anesthesiologist: Darral Dash, DO Performed by: Darral Dash, DO Authorized by: Darral Dash, DO   Preanesthetic Checklist Completed: patient identified, IV checked, site marked, risks and benefits discussed, surgical consent, monitors and equipment checked, pre-op evaluation and timeout performed Spinal Block Patient position: sitting Prep: DuraPrep Patient monitoring: heart rate, cardiac monitor, continuous pulse ox and blood pressure Approach: midline Location: L4-5 Injection technique: single-shot Needle Needle type: Pencan  Needle gauge: 24 G Needle length: 10 cm Assessment Events: CSF return Additional Notes Patient identified. Risks/Benefits/Options discussed with patient including but not limited to bleeding, infection, nerve damage, paralysis, failed block, incomplete pain control, headache, blood pressure changes, nausea, vomiting, reactions to medications, itching and postpartum back pain. Confirmed with bedside nurse the patient's most recent platelet count. Confirmed with patient that they are not currently taking any anticoagulation, have any bleeding history or any family history of bleeding disorders. Patient expressed understanding and wished to proceed. All questions were answered. Sterile technique was used throughout the entire procedure. Please see nursing notes for vital signs. Warning signs of high block given to the patient including shortness of breath, tingling/numbness in hands, complete motor block, or any concerning symptoms with instructions to call for help. Patient was given instructions on fall risk and not to get out of bed. All questions and concerns addressed with instructions to call with any issues or inadequate analgesia.

## 2021-08-08 NOTE — Op Note (Signed)
DATE OF SURGERY:  08/08/2021 TIME: 2:21 PM  PATIENT NAME:  Alex Morrison   AGE: 51 y.o.    PRE-OPERATIVE DIAGNOSIS: Posttraumatic arthrosis, right knee, history of tibial plateau fracture  POST-OPERATIVE DIAGNOSIS:  Same, with MCL deficiency  PROCEDURE: RIGHT total Knee Arthroplasty  SURGEON:  Johnny Bridge, MD   ASSISTANT: Charlies Constable, MD, present and scrubbed throughout the case offering critical assistance with decision making, instrumentation, surgical technique.    2nd Assistant:  Merlene Pulling, PA-C, present and scrubbed throughout the case, critical for assistance with exposure, retraction, instrumentation, and closure.   OPERATIVE IMPLANTS:  Implant Name Type Inv. Item Serial No. Manufacturer Lot No. LRB No. Used Action  CEMENT BONE REFOBACIN [id number] [place] - [id number] Cement CEMENT BONE REFOBACIN R1X40 [place]  ZIMMER RECON(ORTH,TRAU,BIO,SG) [id number] Right 3 Implanted  STEM POLY PAT PLY 29M KNEE - [id number] Knees STEM POLY PAT PLY 29M KNEE  ZIMMER RECON(ORTH,TRAU,BIO,SG) [id number] Right 1 Implanted  STEM FEM EXT PERS 75X14 - [id number] Stem STEM FEM EXT PERS 75X14  ZIMMER RECON(ORTH,TRAU,BIO,SG) [id number] Right 1 Implanted  STEM FEM EXT PERS 75X14 - [id number] Stem STEM FEM EXT PERS 75X14  ZIMMER RECON(ORTH,TRAU,BIO,SG) [id number] Right 1 Implanted  COMP FEM CMT PS PLUS 11 RT - [id number] Joint COMP FEM CMT PS PLUS 11 RT  ZIMMER RECON(ORTH,TRAU,BIO,SG) [id number] Right 1 Implanted  ARTICULAR SURF PSN CCK VE R 10 - [id number] Miscellaneous ARTICULAR SURF PSN CCK VE R 10  ZIMMER RECON(ORTH,TRAU,BIO,SG) [id number] Right 1 Implanted  INSERT TIB PS CMTLS CC FX - [id number] Insert INSERT TIB PS CMTLS CC FX  ZIMMER RECON(ORTH,TRAU,BIO,SG) [id number] Right 1 Implanted  TIBIA FIXED CEMENTED RIGHT size H    ZIMMER [id number] Right 1 Implanted  RESTRICTOR CEMENT SZ 5 C-STEM - [id number] Cement RESTRICTOR CEMENT SZ 5 C-STEM  DEPUY ORTH[employer]AEDICS [id number] Right 1 Implanted  CEMENT RESTRICTOR DEPUY SZ 6 -  [id number] Cement CEMENT RESTRICTOR DEPUY SZ 6  DEPUY ORTHOPAEDICS [id number] Right 1 Implanted      PREOPERATIVE INDICATIONS:  Alex Morrison is a 51 y.o. year old male with end stage bone on bone degenerative arthritis of the knee who failed conservative treatment, including injections, antiinflammatories, activity modification, and assistive devices, and had significant impairment of their activities of daily living, and elected for Total Knee Arthroplasty.  He had significant valgus deformity with coronal plane instability secondary to previous tibial plateau fracture.  The risks, benefits, and alternatives were discussed at length including but not limited to the risks of infection, bleeding, nerve injury, stiffness, blood clots, the need for revision surgery, cardiopulmonary complications, among others, and they were willing to proceed.  OPERATIVE FINDINGS AND UNIQUE ASPECTS OF THE CASE: He had substantial lateral bony defect, and the MCL did not feel competent.  ESTIMATED BLOOD LOSS: 100 mL  Tourniquet time: 127 minutes  OPERATIVE DESCRIPTION:  The patient was brought to the operative room and placed in a supine position.  Anesthesia was administered.  IV antibiotics were given.  The lower extremity was prepped and draped in the usual sterile fashion.  Time out was performed.  The leg was elevated and exsanguinated and the tourniquet was inflated.  Anterior quadriceps tendon splitting approach was performed.  I kept the incision slightly medial in order to maximize the tissue bridge from his previous lateral incision.    There was substantial hypertrophic tissue diffusely throughout the suprapatellar pouch which was excised.  The patella was everted and osteophytes were removed.  The anterior horn of the medial and lateral  meniscus was removed.   The patella was then measured, and cut with the saw.  The thickness before the cut was 26 and after the cut was 15.  A metal shield was used  to protect the patella throughout the case.    The distal femur was opened with the drill and the intramedullary distal femoral cutting jig was utilized, set at 6 degrees resecting 9 mm off the distal femur.  The initial pass did not look like it was going to take much lateral bone, so we cut 2 more millimeters.  Care was taken to protect the collateral ligaments.  Then the extramedullary tibial cutting jig was utilized making the appropriate cut using the anterior tibial crest as a reference building in appropriate posterior slope.  Care was taken during the cut to protect the medial and collateral ligaments.  The proximal tibia was removed along with the posterior horns of the menisci.  The PCL was sacrificed.  The initial cut did not extend deep enough to excise the defect laterally.  Additionally, when testing the ligaments, the MCL did not feel confident, so we elected to proceed with constrained arthroplasty with stabilized stems cemented proximally and distally.  The extensor gap was measured and found to have adequate resection, measuring to a size 10.  We assembled the intramedullary drill, and open the tibia to augment alignment given the anticipated plan for stemmed implants.  An additional 2 mm was taken off of the tibia, which still had some slight defect posterolaterally, but was sufficient for support.  The distal femoral sizing jig was applied, taking care to avoid notching.  This was set at 3 degrees of external rotation.  Then the 4-in-1 cutting jig was applied and the anterior and posterior femur was cut, along with the chamfer cuts.  All posterior osteophytes were removed.  The flexion gap was then measured and was symmetric with the extension gap.  I completed the distal femoral preparation using the appropriate jig to prepare the box.  The proximal tibia sized and prepared accordingly with the reamer and the punch, and then all components were trialed with the poly insert.  We  prepared the tibia also for a cone.  This was used with the reamer.  The knee was found to have excellent balance and full motion.    The real cone was placed.  The above named components were then cemented into place and all excess cement was removed.  The real polyethylene implant was placed.  After the cement had cured I released the tourniquet and confirmed excellent hemostasis with no major posterior vessel injury.  We also irrigated with Betadine impregnated saline.  The patella had a tendency to track laterally, but once the retinacular repair was completed it tracked quite nicely.  The knee was easily taken through a range of motion and the patella tracked well and the knee irrigated copiously and the parapatellar and subcutaneous tissue closed with vicryl, and monocryl with steri strips for the skin.  The wounds were injected with marcaine, and dressed with sterile gauze and the patient was awakened and returned to the PACU in stable and satisfactory condition.  There were no complications.  Total tourniquet time was 127 minutes.

## 2021-08-08 NOTE — H&P (Addendum)
PREOPERATIVE H&P  Chief Complaint: Right knee pain  HPI: Alex Morrison is a 51 y.o. male who presents for preoperative history and physical with a diagnosis of right knee posttraumatic arthrosis. Symptoms are rated as moderate to severe, and have been worsening.  This is significantly impairing activities of daily living.  He has elected for surgical management.  He had a right tibial plateau fracture in the past, that underwent open reduction internal fixation by myself, that subsequently developed malunion and lateral collapse.  He also has undergone removal of hardware in a staged fashion with anticipation of total knee arthroplasty.  Past Medical History:  Diagnosis Date   Abscess of buttock    Acute kidney injury ([place]) 09/27/2013   Depression    Diabetes mellitus without complication ([place])    Diabetic neuropathy ([place])    Dialysis patient ([place])    M,W,F   ESRD (end stage renal disease) ([place])    Glaucoma    HTN (hypertension) 08/08/2018   Obesity    Perforated appendix    Peritonitis   Psoriasis    Retinopathy    Past Surgical History:  Procedure Laterality Date   ABSCESS      RECTAL    APPENDECTOMY     AV FISTULA PLACEMENT Left 01/13/2019   Procedure: LEFT ARM ARTERIOVENOUS (AV) FISTULA CREATION;  Surgeon: Elam Dutch, MD;  Location: [facility];  Service: Vascular;  Laterality: Left;   CATARACT EXTRACTION     Ex lap with ileocecectomy  09/27/2013   Dr. Brantley Stage   HARDWARE REMOVAL Right 06/13/2021   Procedure: HARDWARE REMOVAL;  Surgeon: Marchia Bond, MD;  Location: [facility];  Service: Orthopedics;  Laterality: Right;   ILEOCECETOMY     INCISION AND DRAINAGE     INCISIONAL HERNIA REPAIR N/A 08/11/2018   Procedure: OPEN RETRORECTUS REPAIR OF INCARCERATED INCISIONAL HERNIA WITH BILATERAL POSTERIOR COMPONENT SEPARATION;  Surgeon: [name], MD;  Location: [facility];  Service: General;  Laterality: N/A;   INSERTION OF MESH  08/11/2018   Procedure: INSERTION OF PHASIX MESH;   Surgeon: [name], MD;  Location: [facility];  Service: General;;   insertion of PD catheter  12/2017   IR FLUORO GUIDE CV LINE RIGHT  08/08/2018   IR US GUIDE VASC ACCESS RIGHT  08/08/2018   LAPAROSCOPIC ABDOMINAL EXPLORATION N/A 09/27/2013   LAPAROSCOPIC ASSISTED VENTRAL HERNIA REPAIR  2019   LAPAROSCOPY N/A 09/27/2013   Procedure: LAPAROSCOPY DIAGNOSTIC;  Surgeon: Erroll Luna, MD;  Location: [facility];  Service: General;  Laterality: N/A;   LAPAROTOMY  08/11/2018   Procedure: EXPLORATORY LAPAROTOMY;  Surgeon: [name], MD;  Location: [facility];  Service: General;;   LYSIS OF ADHESION  08/11/2018   Procedure: LYSIS OF ADHESIONS X 30 MINUTES;  Surgeon: [name], MD;  Location: [facility];  Service: General;;   MINOR REMOVAL OF PERITONEAL DIALYSIS CATHETER N/A 01/06/2019   Procedure: REMOVAL OF PERITONEAL DIALYSIS CATHETER;  Surgeon: Kieth Brightly Arta Bruce, MD;  Location: [facility];  Service: General;  Laterality: N/A;   OPEN REDUCTION INTERNAL FIXATION (ORIF) TIBIA/FIBULA FRACTURE Right 03/07/2019   Procedure: OPEN REDUCTION INTERNAL FIXATION (ORIF) FIBULA FRACTURE;  Surgeon: Marchia Bond, MD;  Location: [facility];  Service: Orthopedics;  Laterality: Right;   ORIF TIBIA PLATEAU Right 03/07/2019   Procedure: Open Reduction Internal Fixation (Orif) Tibial Plateau;  Surgeon: Marchia Bond, MD;  Location: [facility];  Service: Orthopedics;  Laterality: Right;   SHOULDER SURGERY Left 1989   Social History   Socioeconomic History  Marital status: Single    Spouse name: Not on file   Number of children: 0   Years of education: Not on file   Highest education level: Not on file  Occupational History   Not on file  Tobacco Use   Smoking status: Never   Smokeless tobacco: Never  Vaping Use   Vaping Use: Never used  Substance and Sexual Activity   Alcohol use: No   Drug use: No   Sexual activity: Not Currently  Other Topics Concern   Not on file  Social History Narrative   Not on file   Social  Determinants of Health   Financial Resource Strain: Not on file  Food Insecurity: Not on file  Transportation Needs: Not on file  Physical Activity: Not on file  Stress: Not on file  Social Connections: Not on file   Family History  Problem Relation Age of Onset   Diabetes Mellitus II Mother    Heart disease Mother    Diabetes Mother    Diabetes Mellitus I Father    Diabetes Father    Diabetes Mellitus II Brother    Colon cancer Neg Hx    Esophageal cancer Neg Hx    Pancreatic cancer Neg Hx    Stomach cancer Neg Hx    No Known Allergies Prior to Admission medications   Medication Sig Start Date End Date Taking? Authorizing Provider  amLODipine (NORVASC) 10 MG tablet Take 10 mg by mouth daily.   Yes [provider]  HYDROcodone-acetaminophen (NORCO) 5-325 MG tablet Take 1-2 tablets by mouth every 6 (six) hours as needed for moderate pain. MAXIMUM TOTAL ACETAMINOPHEN DOSE IS 4000 MG PER DAY 06/13/21  Yes Merlene Pulling K, PA-C  insulin aspart (NOVOLOG) 100 UNIT/ML FlexPen Inject 4-6 Units into the skin 3 (three) times daily as needed for high blood sugar.   Yes [provider]  insulin glargine (LANTUS) 100 UNIT/ML Solostar Pen Inject 8 Units into the skin at bedtime.   Yes [provider]  ondansetron (ZOFRAN) 4 MG tablet Take 1 tablet (4 mg total) by mouth every 8 (eight) hours as needed for nausea or vomiting. 06/13/21  Yes Merlene Pulling K, PA-C  sevelamer carbonate (RENVELA) 800 MG tablet Take 800-2,400 mg by mouth See admin instructions. Take 3 tablets (2400 mg) by mouth two times daily with meals, take 1-2 tablets [phone] mg) with snacks. 01/20/19  Yes [provider]     Positive ROS: All other systems have been reviewed and were otherwise negative with the exception of those mentioned in the HPI and as above.  Physical Exam: General: Alert, no acute distress Cardiovascular: No pedal edema Respiratory: No cyanosis, no use of accessory  musculature GI: No organomegaly, abdomen is soft and non-tender Skin: No lesions in the area of chief complaint.  All of his surgical wounds of healed well. Neurologic: Sensation intact distally Psychiatric: Patient is competent for consent with normal mood and affect Lymphatic: No axillary or cervical lymphadenopathy  MUSCULOSKELETAL: He has a large effusion, with substantial soft tissue swelling, and gross instability to varus and valgus motion.  Range of motion is from 0degrees to 115 degrees.  Assessment: Right knee severe posttraumatic arthrosis with valgus deformity and bone loss, coexisting risk factors including end-stage renal disease on long-term dialysis   Plan: Plan for Procedure(s): TOTAL KNEE ARTHROPLASTY  The risks benefits and alternatives were discussed with the patient including but not limited to the risks of nonoperative treatment, versus surgical intervention including  infection, bleeding, nerve injury,  blood clots, cardiopulmonary complications, morbidity, mortality, among others, and they were willing to proceed.     Johnny Bridge, MD Cell [phone]   08/08/2021 6:42 AM

## 2021-08-08 NOTE — Anesthesia Procedure Notes (Signed)
Anesthesia Regional Block: Adductor canal block   Pre-Anesthetic Checklist: , timeout performed,  Correct Patient, Correct Site, Correct Laterality,  Correct Procedure, Correct Position, site marked,  Risks and benefits discussed,  Surgical consent,  Pre-op evaluation,  At surgeon's request and post-op pain management  Laterality: Right  Prep: Dura Prep       Needles:  Injection technique: Single-shot  Needle Type: Echogenic Stimulator Needle     Needle Length: 10cm  Needle Gauge: 20     Additional Needles:   Procedures:,,,, ultrasound used (permanent image in chart),,    Narrative:  Start time: 08/08/2021 6:50 AM End time: 08/08/2021 6:53 AM Injection made incrementally with aspirations every 5 mL.  Performed by: Personally  Anesthesiologist: Darral Dash, DO  Additional Notes: Patient identified. Risks/Benefits/Options discussed with patient including but not limited to bleeding, infection, nerve damage, failed block, incomplete pain control. Patient expressed understanding and wished to proceed. All questions were answered. Sterile technique was used throughout the entire procedure. Please see nursing notes for vital signs. Aspirated in 5cc intervals with injection for negative confirmation. Patient was given instructions on fall risk and not to get out of bed. All questions and concerns addressed with instructions to call with any issues or inadequate analgesia.

## 2021-08-08 NOTE — Evaluation (Signed)
Physical Therapy Evaluation Patient Details Name: Alex Morrison MRN: [id number] DOB: [birth year] Today's Date: 08/08/2021  History of Present Illness  Pt is a 51 y/o male s/p R TKA. PMH includes HTN and DM.  Clinical Impression  Pt admitted secondary to problem above with deficits below. Pt requiring min guard A for mobility with RW. Pt with increased drainage at surgical site which limited further mobility; RN at bedside and addressing. Anticipate pt will progress well. Verbally reviewed R TKA exercise handout with pt. Reports his parents will be able to assist as needed. Will continue to follow acutely.        Recommendations for follow up therapy are one component of a multi-disciplinary discharge planning process, led by the attending physician.  Recommendations may be updated based on patient status, additional functional criteria and insurance authorization.  Follow Up Recommendations Follow physician's recommendations for discharge plan and follow up therapies      Assistance Recommended at Discharge Intermittent Supervision/Assistance  Patient can return home with the following  Assist for transportation;Assistance with cooking/housework    Equipment Recommendations None recommended by PT  Recommendations for Other Services       Functional Status Assessment Patient has had a recent decline in their functional status and demonstrates the ability to make significant improvements in function in a reasonable and predictable amount of time.     Precautions / Restrictions Precautions Precautions: Knee Precaution Booklet Issued: Yes (comment) Precaution Comments: Reviewed knee precautions with pt. Restrictions Weight Bearing Restrictions: Yes RLE Weight Bearing: Weight bearing as tolerated      Mobility  Bed Mobility Overal bed mobility: Needs Assistance Bed Mobility: Supine to Sit, Sit to Supine     Supine to sit: Supervision Sit to supine: Supervision   General bed  mobility comments: supervision for safety.    Transfers Overall transfer level: Needs assistance Equipment used: Rolling walker (2 wheels) Transfers: Sit to/from Stand Sit to Stand: Min guard           General transfer comment: Min guard for safety. Cues for hand placement.    Ambulation/Gait Ambulation/Gait assistance: Min guard Gait Distance (Feet): 15 Feet Assistive device: Rolling walker (2 wheels) Gait Pattern/deviations: Step-to pattern, Decreased step length - right, Decreased step length - left, Decreased weight shift to right, Antalgic Gait velocity: Decreased     General Gait Details: Slow, antalgic gait. Cues for sequencing using RW. After short distance, pt's incision with increased drainage, so further mobility deferred so RN could address.  Stairs Stairs:  (verbally reviewed how to perform threshold step)          Wheelchair Mobility    Modified Rankin (Stroke Patients Only)       Balance Overall balance assessment: Needs assistance Sitting-balance support: No upper extremity supported, Feet supported Sitting balance-Leahy Scale: Good     Standing balance support: Bilateral upper extremity supported Standing balance-Leahy Scale: Poor Standing balance comment: Reliant on UE support                             Pertinent Vitals/Pain Pain Assessment Pain Assessment: Faces Faces Pain Scale: Hurts little more Pain Location: R knee Pain Descriptors / Indicators: Grimacing, Guarding Pain Intervention(s): Monitored during session, Limited activity within patient's tolerance, Repositioned    Home Living Family/patient expects to be discharged to:: Private residence Living Arrangements: Parent Available Help at Discharge: Family Type of Home: House Home Access: Stairs to enter Entrance Stairs-Rails: None  Entrance Stairs-Number of Steps: 1 (threshold)   Home Layout: One level Home Equipment: Conservation officer, nature (2 wheels);Cane - single  point;Shower seat      Prior Function Prior Level of Function : Independent/Modified Independent             Mobility Comments: Uses cane for ambulation       Hand Dominance        Extremity/Trunk Assessment   Upper Extremity Assessment Upper Extremity Assessment: Overall WFL for tasks assessed    Lower Extremity Assessment Lower Extremity Assessment: RLE deficits/detail RLE Deficits / Details: Deficits consistent with post op pain and weakness.    Cervical / Trunk Assessment Cervical / Trunk Assessment: Normal  Communication   Communication: No difficulties  Cognition Arousal/Alertness: Awake/alert Behavior During Therapy: WFL for tasks assessed/performed Overall Cognitive Status: Within Functional Limits for tasks assessed                                          General Comments      Exercises Total Joint Exercises Ankle Circles/Pumps:  (verbally reviewed) Quad Sets:  (verbally reviewed) Short Arc Quad:  (verbally reviewed) Heel Slides:  (verbally reviewed) Hip ABduction/ADduction:  (verbally reviewed) Straight Leg Raises:  (verbally reviewed) Long Arc Quad:  (verbally reviewed) Knee Flexion:  (verbally reviewed)   Assessment/Plan    PT Assessment Patient needs continued PT services  PT Problem List Decreased strength;Decreased range of motion;Decreased activity tolerance;Decreased mobility;Decreased balance;Decreased knowledge of use of DME;Pain       PT Treatment Interventions Gait training;DME instruction;Stair training;Functional mobility training;Therapeutic activities;Balance training;Therapeutic exercise;Patient/family education    PT Goals (Current goals can be found in the Care Plan section)  Acute Rehab PT Goals Patient Stated Goal: to go home PT Goal Formulation: With patient Time For Goal Achievement: 08/22/21 Potential to Achieve Goals: Good    Frequency 7X/week     Co-evaluation               AM-PAC PT  "6 Clicks" Mobility  Outcome Measure Help needed turning from your back to your side while in a flat bed without using bedrails?: None Help needed moving from lying on your back to sitting on the side of a flat bed without using bedrails?: A Little Help needed moving to and from a bed to a chair (including a wheelchair)?: A Little Help needed standing up from a chair using your arms (e.g., wheelchair or bedside chair)?: A Little Help needed to walk in hospital room?: A Little Help needed climbing 3-5 steps with a railing? : A Little 6 Click Score: 19    End of Session Equipment Utilized During Treatment: Gait belt Activity Tolerance: Treatment limited secondary to medical complications (Comment) (increased drainage) Patient left: in bed;with nursing/sitter in room (in PACU in bed with RN addressing surgical site) Nurse Communication: Mobility status PT Visit Diagnosis: Other abnormalities of gait and mobility (R26.89);Pain Pain - Right/Left: Right Pain - part of body: Knee    Time: 1356-1420 PT Time Calculation (min) (ACUTE ONLY): 24 min   Charges:   PT Evaluation $PT Eval Low Complexity: 1 Low PT Treatments $Therapeutic Activity: 8-22 mins        Lou Miner, DPT  Acute Rehabilitation Services  Office: [phone]   Rudean Hitt 08/08/2021, 2:36 PM

## 2021-08-08 NOTE — Discharge Instructions (Signed)
INSTRUCTIONS AFTER JOINT REPLACEMENT  ° °Remove items at home which could result in a fall. This includes throw rugs or furniture in walking pathways °ICE to the affected joint every three hours while awake for 30 minutes at a time, for at least the first 3-5 days, and then as needed for pain and swelling.  Continue to use ice for pain and swelling. You may notice swelling that will progress down to the foot and ankle.  This is normal after surgery.  Elevate your leg when you are not up walking on it.   °Continue to use the breathing machine you got in the hospital (incentive spirometer) which will help keep your temperature down.  It is common for your temperature to cycle up and down following surgery, especially at night when you are not up moving around and exerting yourself.  The breathing machine keeps your lungs expanded and your temperature down. ° ° °DIET:  As you were doing prior to hospitalization, we recommend a well-balanced diet. ° °DRESSING / WOUND CARE / SHOWERING ° °You may shower 3 days after surgery, but keep the wounds dry during showering.  You may use an occlusive plastic wrap (Press'n Seal for example), NO SOAKING/SUBMERGING IN THE BATHTUB.  If the bandage gets wet, change with a clean dry gauze.  If the incision gets wet, pat the wound dry with a clean towel. ° °ACTIVITY ° °Increase activity slowly as tolerated, but follow the weight bearing instructions below.   °No driving for 6 weeks or until further direction given by your physician.  You cannot drive while taking narcotics.  °No lifting or carrying greater than 10 lbs. until further directed by your surgeon. °Avoid periods of inactivity such as sitting longer than an hour when not asleep. This helps prevent blood clots.  °You may return to work once you are authorized by your doctor.  ° ° ° °WEIGHT BEARING  ° °Weight bearing as tolerated with assist device (walker, cane, etc) as directed, use it as long as suggested by your surgeon or  therapist, typically at least 4-6 weeks. ° ° °EXERCISES ° °Results after joint replacement surgery are often greatly improved when you follow the exercise, range of motion and muscle strengthening exercises prescribed by your doctor. Safety measures are also important to protect the joint from further injury. Any time any of these exercises cause you to have increased pain or swelling, decrease what you are doing until you are comfortable again and then slowly increase them. If you have problems or questions, call your caregiver or physical therapist for advice.  ° °Rehabilitation is important following a joint replacement. After just a few days of immobilization, the muscles of the leg can become weakened and shrink (atrophy).  These exercises are designed to build up the tone and strength of the thigh and leg muscles and to improve motion. Often times heat used for twenty to thirty minutes before working out will loosen up your tissues and help with improving the range of motion but do not use heat for the first two weeks following surgery (sometimes heat can increase post-operative swelling).  ° °These exercises can be done on a training (exercise) mat, on the floor, on a table or on a bed. Use whatever works the best and is most comfortable for you.    Use music or television while you are exercising so that the exercises are a pleasant break in your day. This will make your life better with the exercises acting   as a break in your routine that you can look forward to.   Perform all exercises about fifteen times, three times per day or as directed.  You should exercise both the operative leg and the other leg as well.  Exercises include:   Quad Sets - Tighten up the muscle on the front of the thigh (Quad) and hold for 5-10 seconds.   Straight Leg Raises - With your knee straight (if you were given a brace, keep it on), lift the leg to 60 degrees, hold for 3 seconds, and slowly lower the leg.  Perform this  exercise against resistance later as your leg gets stronger.  Leg Slides: Lying on your back, slowly slide your foot toward your buttocks, bending your knee up off the floor (only go as far as is comfortable). Then slowly slide your foot back down until your leg is flat on the floor again.  Angel Wings: Lying on your back spread your legs to the side as far apart as you can without causing discomfort.  Hamstring Strength:  Lying on your back, push your heel against the floor with your leg straight by tightening up the muscles of your buttocks.  Repeat, but this time bend your knee to a comfortable angle, and push your heel against the floor.  You may put a pillow under the heel to make it more comfortable if necessary.   A rehabilitation program following joint replacement surgery can speed recovery and prevent re-injury in the future due to weakened muscles. Contact your doctor or a physical therapist for more information on knee rehabilitation.    CONSTIPATION  Constipation is defined medically as fewer than three stools per week and severe constipation as less than one stool per week.  Even if you have a regular bowel pattern at home, your normal regimen is likely to be disrupted due to multiple reasons following surgery.  Combination of anesthesia, postoperative narcotics, change in appetite and fluid intake all can affect your bowels.   YOU MUST use at least one of the following options; they are listed in order of increasing strength to get the job done.  They are all available over the counter, and you may need to use some, POSSIBLY even all of these options:    Drink plenty of fluids (prune juice may be helpful) and high fiber foods Colace 100 mg by mouth twice a day  Senokot for constipation as directed and as needed Dulcolax (bisacodyl), take with full glass of water  Miralax (polyethylene glycol) once or twice a day as needed.  If you have tried all these things and are unable to have a  bowel movement in the first 3-4 days after surgery call either your surgeon or your primary doctor.    If you experience loose stools or diarrhea, hold the medications until you stool forms back up.  If your symptoms do not get better within 1 week or if they get worse, check with your doctor.  If you experience "the worst abdominal pain ever" or develop nausea or vomiting, please contact the office immediately for further recommendations for treatment.   ITCHING:  If you experience itching with your medications, try taking only a single pain pill, or even half a pain pill at a time.  You can also use Benadryl over the counter for itching or also to help with sleep.   TED HOSE STOCKINGS:  Okay to take off your large ace wrap 24 hours after surgery. Then, use stockings  on both legs until for at least 2 weeks or as directed by physician office. They may be removed at night for sleeping.  MEDICATIONS:  See your medication summary on the "After Visit Summary" that nursing will review with you.  You may have some home medications which will be placed on hold until you complete the course of blood thinner medication.  It is important for you to complete the blood thinner medication as prescribed.  PRECAUTIONS:  If you experience chest pain or shortness of breath - call 911 immediately for transfer to the hospital emergency department.   If you develop a fever greater that 101 F, purulent drainage from wound, increased redness or drainage from wound, foul odor from the wound/dressing, or calf pain - CONTACT YOUR SURGEON.                                                   FOLLOW-UP APPOINTMENTS:  If you do not already have a post-op appointment, please call the office for an appointment to be seen by your surgeon.  Guidelines for how soon to be seen are listed in your "After Visit Summary", but are typically between 1-4 weeks after surgery.  OTHER INSTRUCTIONS:   Knee Replacement:  Do not place pillow  under knee, focus on keeping the knee straight while resting.   POST-OPERATIVE OPIOID TAPER INSTRUCTIONS: It is important to wean off of your opioid medication as soon as possible. If you do not need pain medication after your surgery it is ok to stop day one. Opioids include: Codeine, Hydrocodone(Norco, Vicodin), Oxycodone(Percocet, oxycontin) and hydromorphone amongst others.  Long term and even short term use of opiods can cause: Increased pain response Dependence Constipation Depression Respiratory depression And more.  Withdrawal symptoms can include Flu like symptoms Nausea, vomiting And more Techniques to manage these symptoms Hydrate well Eat regular healthy meals Stay active Use relaxation techniques(deep breathing, meditating, yoga) Do Not substitute Alcohol to help with tapering If you have been on opioids for less than two weeks and do not have pain than it is ok to stop all together.  Plan to wean off of opioids This plan should start within one week post op of your joint replacement. Maintain the same interval or time between taking each dose and first decrease the dose.  Cut the total daily intake of opioids by one tablet each day Next start to increase the time between doses. The last dose that should be eliminated is the evening dose.   MAKE SURE YOU:  Understand these instructions.  Get help right away if you are not doing well or get worse.    Thank you for letting us be a part of your medical care team.  It is a privilege we respect greatly.  We hope these instructions will help you stay on track for a fast and full recovery!

## 2021-08-08 NOTE — Transfer of Care (Signed)
Immediate Anesthesia Transfer of Care Note  Patient: Alex Morrison  Procedure(s) Performed: TOTAL KNEE ARTHROPLASTY (Right: Knee)  Patient Location: PACU  Anesthesia Type:Spinal and MAC combined with regional for post-op pain  Level of Consciousness: awake, alert , oriented and patient cooperative  Airway & Oxygen Therapy: Patient Spontanous Breathing and Patient connected to nasal cannula oxygen  Post-op Assessment: Report given to RN, Post -op Vital signs reviewed and stable and Moving upper extremities  Post vital signs: Reviewed and stable  Last Vitals:  Vitals Value Taken Time  BP 130/75 08/08/21 1123  Temp    Pulse 62 08/08/21 1127  Resp 9 08/08/21 1124  SpO2 94 % 08/08/21 1127  Vitals shown include unvalidated device data.  Last Pain:  Vitals:   08/08/21 0558  TempSrc:   PainSc: 0-No pain      Patients Stated Pain Goal: 0 (75/88/32 5498)  Complications: No notable events documented.

## 2021-08-09 ENCOUNTER — Encounter (HOSPITAL_COMMUNITY): Payer: Self-pay | Admitting: Orthopedic Surgery

## 2021-08-09 NOTE — Anesthesia Postprocedure Evaluation (Signed)
Anesthesia Post Note  Patient: Kamonte Mcmichen Hietpas  Procedure(s) Performed: TOTAL KNEE ARTHROPLASTY (Right: Knee)     Patient location during evaluation: PACU Anesthesia Type: Regional, MAC and Spinal Level of consciousness: awake and alert Pain management: pain level controlled Vital Signs Assessment: post-procedure vital signs reviewed and stable Respiratory status: spontaneous breathing, nonlabored ventilation, respiratory function stable and patient connected to nasal cannula oxygen Cardiovascular status: stable and blood pressure returned to baseline Postop Assessment: no apparent nausea or vomiting Anesthetic complications: no   No notable events documented.  Last Vitals:  Vitals:   08/08/21 1345 08/08/21 1400  BP: 135/76 (!) 143/72  Pulse: 61 62  Resp: 13 11  Temp:  36.5 C  SpO2: 97% 96%    Last Pain:  Vitals:   08/08/21 1400  TempSrc:   PainSc: 0-No pain                 Belenda Cruise P Rosario Kushner

## 2021-08-09 NOTE — Discharge Summary (Signed)
Discharge Summary  Patient ID: SANDIP POWER MRN: [id number] DOB/AGE: Aug 05, 1970 51 y.o.  Admit date: 08/08/2021 Discharge date: 08/08/2021  Admission Diagnoses:  Arthrosis of knee  Discharge Diagnoses:  Principal Problem:   Arthrosis of knee Active Problems:   S/P total knee replacement, right   Past Medical History:  Diagnosis Date   Abscess of buttock    Acute kidney injury ([place]) 09/27/2013   Depression    Diabetes mellitus without complication ([facility])    Diabetic neuropathy ([place])    Dialysis patient ([place])    M,W,F   ESRD (end stage renal disease) ([place])    Glaucoma    HTN (hypertension) 08/08/2018   Obesity    Perforated appendix    Peritonitis   Psoriasis    Retinopathy     Surgeries: Procedure(s): TOTAL KNEE ARTHROPLASTY on 08/08/2021   Consultants (if any):   Discharged Condition: Improved  Hospital Course: Alex Morrison is an 51 y.o. male who was admitted 08/08/2021 with a diagnosis of Arthrosis of knee and went to the operating room on 08/08/2021 and underwent the above named procedures.    He was given perioperative antibiotics:  Anti-infectives (From admission, onward)    Start     Dose/Rate Route Frequency Ordered Stop   08/08/21 0600  ceFAZolin (ANCEF) IVPB 2g/100 mL premix        2 g 200 mL/hr over 30 Minutes Intravenous On call to O.R. 08/08/21 0543 08/08/21 0753   08/08/21 0553  ceFAZolin (ANCEF) 2-4 GM/100ML-% IVPB       Note to Pharmacy: Ladoris Gene A: cabinet override      08/08/21 0553 08/08/21 0839     .  He was given sequential compression devices, early ambulation, and aspirin for DVT prophylaxis.  He benefited maximally from the hospital stay and there were no complications.    Recent vital signs:  Vitals:   08/08/21 1345 08/08/21 1400  BP: 135/76 (!) 143/72  Pulse: 61 62  Resp: 13 11  Temp:  97.7 F (36.5 C)  SpO2: 97% 96%    Recent laboratory studies:  Lab Results  Component Value Date   HGB 10.9 (L) 08/08/2021   HGB  9.9 (L) 06/13/2021   HGB 11.9 (L) 07/20/2020   Lab Results  Component Value Date   WBC 8.3 07/20/2020   PLT 206 07/20/2020   Lab Results  Component Value Date   INR 1.0 03/06/2019   Lab Results  Component Value Date   NA 137 08/08/2021   K 3.9 08/08/2021   CL 94 (L) 08/08/2021   CO2 32 07/20/2020   BUN 36 (H) 08/08/2021   CREATININE 6.70 (H) 08/08/2021   GLUCOSE 168 (H) 08/08/2021    Discharge Medications:   Allergies as of 08/08/2021   No Known Allergies      Medication List     STOP taking these medications    HYDROcodone-acetaminophen 5-325 MG tablet Commonly known as: Norco Replaced by: HYDROcodone-acetaminophen 10-325 MG tablet       TAKE these medications    amLODipine 10 MG tablet Commonly known as: NORVASC Take 10 mg by mouth daily.   aspirin EC 81 MG tablet Take 1 tablet (81 mg total) by mouth 2 (two) times daily. For DVT Prophylaxis.   HYDROcodone-acetaminophen 10-325 MG tablet Commonly known as: Norco Take 1 tablet by mouth every 4 (four) hours as needed for severe pain. Replaces: HYDROcodone-acetaminophen 5-325 MG tablet   insulin aspart 100 UNIT/ML FlexPen Commonly known as: NOVOLOG Inject  4-6 Units into the skin 3 (three) times daily as needed for high blood sugar.   insulin glargine 100 UNIT/ML Solostar Pen Commonly known as: LANTUS Inject 8 Units into the skin at bedtime.   ondansetron 4 MG tablet Commonly known as: Zofran Take 1 tablet (4 mg total) by mouth every 8 (eight) hours as needed for nausea or vomiting.   sevelamer carbonate 800 MG tablet Commonly known as: RENVELA Take 800-2,400 mg by mouth See admin instructions. Take 3 tablets (2400 mg) by mouth two times daily with meals, take 1-2 tablets ([phone] mg) with snacks.        Diagnostic Studies: DG Knee Right Port  Result Date: 08/08/2021 CLINICAL DATA:  Status post total right knee arthroplasty. EXAM: PORTABLE RIGHT KNEE - 1-2 VIEW COMPARISON:  CT right knee  05/14/2020 FINDINGS: Interval removal of prior proximal tibial lateral plate and screw fixation hardware. Interval total right knee arthroplasty. No perihardware lucency is seen to indicate hardware failure or loosening. There is diffuse decreased bone mineralization. Expected postoperative changes including anterior knee soft tissue swelling and subcutaneous and intra-articular air. Moderate to large knee joint effusion. No acute fracture or dislocation. IMPRESSION: 1. Status post total right knee arthroplasty without evidence of hardware failure. 2. There is a moderate-to-large joint effusion. Electronically Signed   By: Yvonne Kendall M.D.   On: 08/08/2021 12:01    Disposition: Discharge disposition: 01-Home or Self Care          Follow-up Information     Marchia Bond, MD. Schedule an appointment as soon as possible for a visit in 2 week(s).   Specialty: Orthopedic Surgery Contact information: [address] [place] [address] [phone]                  Signed: Jola Baptist 08/09/2021, 2:04 PM

## 2021-08-10 ENCOUNTER — Encounter: Payer: Self-pay | Admitting: Physical Therapy

## 2021-08-10 ENCOUNTER — Ambulatory Visit: Payer: Medicare Other | Attending: Orthopedic Surgery | Admitting: Physical Therapy

## 2021-08-10 DIAGNOSIS — M25561 Pain in right knee: Secondary | ICD-10-CM | POA: Diagnosis present

## 2021-08-10 DIAGNOSIS — R600 Localized edema: Secondary | ICD-10-CM | POA: Insufficient documentation

## 2021-08-10 DIAGNOSIS — M6281 Muscle weakness (generalized): Secondary | ICD-10-CM | POA: Insufficient documentation

## 2021-08-10 DIAGNOSIS — R262 Difficulty in walking, not elsewhere classified: Secondary | ICD-10-CM | POA: Diagnosis present

## 2021-08-10 DIAGNOSIS — R2681 Unsteadiness on feet: Secondary | ICD-10-CM | POA: Diagnosis present

## 2021-08-10 DIAGNOSIS — R2689 Other abnormalities of gait and mobility: Secondary | ICD-10-CM | POA: Diagnosis present

## 2021-08-10 DIAGNOSIS — R278 Other lack of coordination: Secondary | ICD-10-CM | POA: Insufficient documentation

## 2021-08-10 NOTE — Therapy (Signed)
OUTPATIENT PHYSICAL THERAPY LOWER EXTREMITY EVALUATION   Patient Name: Alex Morrison MRN: [id number] DOB:10-Jul-1970, 51 y.o., male Today's Date: 08/10/2021   PT End of Session - 08/10/21 1146     Visit Number 1    Date for PT Re-Evaluation 11/02/21    PT Start Time 1058    PT Stop Time 1140    PT Time Calculation (min) 42 min    Activity Tolerance Patient tolerated treatment well    Behavior During Therapy [facility] for tasks assessed/performed             Past Medical History:  Diagnosis Date   Abscess of buttock    Acute kidney injury ([place]) 09/27/2013   Depression    Diabetes mellitus without complication ([place])    Diabetic neuropathy ([place])    Dialysis patient ([place])    M,W,F   ESRD (end stage renal disease) ([place])    Glaucoma    HTN (hypertension) 08/08/2018   Obesity    Perforated appendix    Peritonitis   Psoriasis    Retinopathy    Past Surgical History:  Procedure Laterality Date   ABSCESS      RECTAL    APPENDECTOMY     AV FISTULA PLACEMENT Left 01/13/2019   Procedure: LEFT ARM ARTERIOVENOUS (AV) FISTULA CREATION;  Surgeon: Elam Dutch, MD;  Location: [place];  Service: Vascular;  Laterality: Left;   CATARACT EXTRACTION     Ex lap with ileocecectomy  09/27/2013   Dr. Brantley Stage   HARDWARE REMOVAL Right 06/13/2021   Procedure: HARDWARE REMOVAL;  Surgeon: Marchia Bond, MD;  Location: [place];  Service: Orthopedics;  Laterality: Right;   ILEOCECETOMY     INCISION AND DRAINAGE     INCISIONAL HERNIA REPAIR N/A 08/11/2018   Procedure: OPEN RETRORECTUS REPAIR OF INCARCERATED INCISIONAL HERNIA WITH BILATERAL POSTERIOR COMPONENT SEPARATION;  Surgeon: [name], MD;  Location: [place];  Service: General;  Laterality: N/A;   INSERTION OF MESH  08/11/2018   Procedure: INSERTION OF PHASIX MESH;  Surgeon: [name], MD;  Location: [place];  Service: General;;   insertion of PD catheter  12/2017   IR FLUORO GUIDE CV LINE RIGHT  08/08/2018   IR US GUIDE VASC ACCESS  RIGHT  08/08/2018   LAPAROSCOPIC ABDOMINAL EXPLORATION N/A 09/27/2013   LAPAROSCOPIC ASSISTED VENTRAL HERNIA REPAIR  2019   LAPAROSCOPY N/A 09/27/2013   Procedure: LAPAROSCOPY DIAGNOSTIC;  Surgeon: Erroll Luna, MD;  Location: [place];  Service: General;  Laterality: N/A;   LAPAROTOMY  08/11/2018   Procedure: EXPLORATORY LAPAROTOMY;  Surgeon: [name], MD;  Location: [place];  Service: General;;   LYSIS OF ADHESION  08/11/2018   Procedure: LYSIS OF ADHESIONS X 30 MINUTES;  Surgeon: [name], MD;  Location: [place];  Service: General;;   MINOR REMOVAL OF PERITONEAL DIALYSIS CATHETER N/A 01/06/2019   Procedure: REMOVAL OF PERITONEAL DIALYSIS CATHETER;  Surgeon: Kieth Brightly Arta Bruce, MD;  [facility];  Service: General;  Laterality: N/A;   OPEN REDUCTION INTERNAL FIXATION (ORIF) TIBIA/FIBULA FRACTURE Right 03/07/2019   Procedure: OPEN REDUCTION INTERNAL FIXATION (ORIF) FIBULA FRACTURE;  Surgeon: Marchia Bond, MD;  Location: [place];  Service: Orthopedics;  Laterality: Right;   ORIF TIBIA PLATEAU Right 03/07/2019   Procedure: Open Reduction Internal Fixation (Orif) Tibial Plateau;  Surgeon: Marchia Bond, MD;  Location: [place];  Service: Orthopedics;  Laterality: Right;   SHOULDER SURGERY Left 1989   TOTAL KNEE ARTHROPLASTY Right 08/08/2021   Procedure: TOTAL KNEE ARTHROPLASTY;  Surgeon: Marchia Bond, MD;  Location: [place];  Service: Orthopedics;  Laterality: Right;   Patient Active Problem List   Diagnosis Date Noted   Arthrosis of knee 08/08/2021   S/P total knee replacement, right 08/08/2021   Tibial plateau fracture 03/06/2019   ESRD (end stage renal disease) ([place]) 08/08/2018   HTN (hypertension) 08/08/2018   SBO (small bowel obstruction) ([place]) 53/61/4431   Periumbilical hernia 54/00/8676   Abdominal hernia as complication of peritoneal dialysis 08/08/2018   Psoriasis 09/29/2013   Acute gangrenous appendicitis with perforation and peritonitis 09/27/2013   Type 1 diabetes  mellitus ([place]) 09/27/2013   Acute kidney injury ([place]) 09/27/2013   DM (diabetes mellitus) type 2, uncontrolled, with ketoacidosis ([place]) 07/17/2012   DKA (diabetic ketoacidosis) ([place]) 07/16/2012   Perirectal abscess 07/14/2012    PCP: Haywood Pao  REFERRING PROVIDER: Marchia Bond, MD   REFERRING DIAG: Diagnosis [phone] (ICD-10-CM) - Status post right knee replacement   THERAPY DIAG:  Muscle weakness (generalized)  Difficulty in walking, not elsewhere classified  Localized edema  Other lack of coordination  Other abnormalities of gait and mobility  Unsteadiness on feet  Acute pain of right knee  Rationale for Evaluation and Treatment Rehabilitation  ONSET DATE: 08/08/21  SUBJECTIVE:   SUBJECTIVE STATEMENT: Patient reports he fell 2 years ago, sustained a patellar fracture which led to cartilage damage and a TKR 2 days ago.  PERTINENT HISTORY: Patellar fracture 2 years ago. DM, dialysis M, W, F  PAIN:  Are you having pain? Yes: NPRS scale: 4/10 Pain location: Ant R knee Pain description: severe Aggravating factors: Had severe pain during dialysis, 9/10 Relieving factors: Tylenol, movement  PRECAUTIONS: None  WEIGHT BEARING RESTRICTIONS No  FALLS:  Has patient fallen in last 6 months? No  LIVING ENVIRONMENT: Lives with: lives with their spouse Lives in: House/apartment Stairs: Yes: External: 12 steps; on right going up Has following equipment at home: Walker - 2 wheeled, cane, has BSC, but not using.  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  PLOF: Independent  PATIENT GOALS Get back to as normal as possible.   OBJECTIVE:   PATIENT SURVEYS:  FOTO 46.7  COGNITION:  Overall cognitive status: Within functional limits for tasks assessed     SENSATION: WFL  EDEMA: R LE heavily bandaged, swelling present.  POSTURE: No Significant postural limitations  PALPATION: Deferred R knee heavily bandaged  LOWER EXTREMITY ROM: Other  ROM grossly WFL  Passive ROM Right eval Left eval  Hip flexion    Hip extension    Hip abduction    Hip adduction    Hip internal rotation    Hip external rotation    Knee flexion 91   Knee extension 23   Ankle dorsiflexion    Ankle plantarflexion    Ankle inversion    Ankle eversion     (Blank rows = not tested)  LOWER EXTREMITY MMT:  MMT Right eval Left eval  Hip flexion 3+ 4  Hip extension 3+ 4  Hip abduction 3 4  Hip adduction    Hip internal rotation    Hip external rotation    Knee flexion 3- 4  Knee extension 2+ 4  Ankle dorsiflexion 3+ 4  Ankle plantarflexion    Ankle inversion    Ankle eversion     (Blank rows = not tested)  GAIT: Distance walked: 77' Assistive device utilized: Environmental consultant - 2 wheeled Level of assistance: Modified independence Comments: Step to gait pattern, walker too  low, raised 1 notch, may be able to raise one more. Antalgic on R with decreased WB and stance time.    TODAY'S TREATMENT: AAROM,  Ankle pumps, quad sets, short kicks, SLR, heel slides, SLR, seated knee flexion and ext, 5 reps each   PATIENT EDUCATION:  Education details: HEP, POC Person educated: Patient Education method: Explanation, Demonstration, and Handouts Education comprehension: verbalized understanding and returned demonstration   HOME EXERCISE PROGRAM:  [id number]  ASSESSMENT:  CLINICAL IMPRESSION: Patient is a 51 y.o. who was seen today for physical therapy evaluation and treatment for R TKR 08/08/21. He presents with weakness, decreased ROM, decreased safety and I with gait as expected. Reports pain in knee, fluctuating. He will benefit from PT to address weakness, limited ROM, and functional mobility re-education.   OBJECTIVE IMPAIRMENTS Abnormal gait, decreased activity tolerance, decreased balance, decreased coordination, decreased endurance, difficulty walking, decreased ROM, decreased strength, increased edema, impaired flexibility, postural  dysfunction, and pain.   ACTIVITY LIMITATIONS carrying, lifting, bending, sitting, standing, squatting, stairs, transfers, bed mobility, bathing, toileting, dressing, hygiene/grooming, and locomotion level  PARTICIPATION LIMITATIONS: meal prep, cleaning, laundry, shopping, community activity, occupation, and yard work  PERSONAL FACTORS Past/current experiences and 1 comorbidity: DM with dialysis,  are also affecting patient's functional outcome.   REHAB POTENTIAL: Good  CLINICAL DECISION MAKING: Evolving/moderate complexity  EVALUATION COMPLEXITY: Moderate   GOALS: Goals reviewed with patient? Yes  SHORT TERM GOALS: Target date: 09/07/2021  I with basic HEP Baseline: Goal status: INITIAL  2.  Increase R knee ROM to 15-110 degrees. Baseline: 23-91 Goal status: INITIAL   LONG TERM GOALS: Target date: 11/02/2021   I with final HEP Baseline:  Goal status: INITIAL  2.  Improve R knee ROM to 5-120 Baseline: 23-91 Goal status: INITIAL  3.  Increased RLE strength to at least 4-/5 throughout Baseline: 2+-3-/5 Goal status: INITIAL  4.  Increase FOTO to at least 72 Baseline: 46.7 Goal status: INITIAL  5.  Patient will ambulate I on level and unlevel surfaces x at least 1000' Baseline: RW, 80', multiple deviations. Goal status: INITIAL  PLAN: PT FREQUENCY:  2-3x/week  PT DURATION: 12 weeks  PLANNED INTERVENTIONS: Therapeutic exercises, Therapeutic activity, Neuromuscular re-education, Balance training, Gait training, Patient/Family education, Self Care, Joint mobilization, Stair training, Electrical stimulation, Cryotherapy, Moist heat, Compression bandaging, Vasopneumatic device, Manual therapy, and Re-evaluation  PLAN FOR NEXT SESSION: Update HEP as appropriate, ROM, strengthening   Marcelina Morel, DPT 08/10/2021, 12:58 PM

## 2021-08-15 ENCOUNTER — Ambulatory Visit: Payer: BC Managed Care – PPO | Attending: Orthopedic Surgery | Admitting: Physical Therapy

## 2021-08-15 DIAGNOSIS — R2689 Other abnormalities of gait and mobility: Secondary | ICD-10-CM | POA: Insufficient documentation

## 2021-08-15 DIAGNOSIS — R2681 Unsteadiness on feet: Secondary | ICD-10-CM | POA: Insufficient documentation

## 2021-08-15 DIAGNOSIS — R600 Localized edema: Secondary | ICD-10-CM | POA: Diagnosis present

## 2021-08-15 DIAGNOSIS — M25561 Pain in right knee: Secondary | ICD-10-CM | POA: Insufficient documentation

## 2021-08-15 DIAGNOSIS — R262 Difficulty in walking, not elsewhere classified: Secondary | ICD-10-CM | POA: Diagnosis present

## 2021-08-15 DIAGNOSIS — M6281 Muscle weakness (generalized): Secondary | ICD-10-CM | POA: Insufficient documentation

## 2021-08-15 DIAGNOSIS — R278 Other lack of coordination: Secondary | ICD-10-CM | POA: Diagnosis present

## 2021-08-15 NOTE — Therapy (Signed)
OUTPATIENT PHYSICAL THERAPY LOWER EXTREMITY  Patient Name: Alex Morrison MRN: [id number] DOB:Sep 24, 1970, 51 y.o., male Today's Date: 08/15/2021   PT End of Session - 08/15/21 0839     Visit Number 2    Date for PT Re-Evaluation 11/02/21    PT Start Time 0837    PT Stop Time 0925    PT Time Calculation (min) 48 min             Past Medical History:  Diagnosis Date   Abscess of buttock    Acute kidney injury ([place]) 09/27/2013   Depression    Diabetes mellitus without complication ([place])    Diabetic neuropathy ([place])    Dialysis patient ([place])    M,W,F   ESRD (end stage renal disease) ([place])    Glaucoma    HTN (hypertension) 08/08/2018   Obesity    Perforated appendix    Peritonitis   Psoriasis    Retinopathy    Past Surgical History:  Procedure Laterality Date   ABSCESS      RECTAL    APPENDECTOMY     AV FISTULA PLACEMENT Left 01/13/2019   Procedure: LEFT ARM ARTERIOVENOUS (AV) FISTULA CREATION;  Surgeon: Elam Dutch, MD;  Location: [place];  Service: Vascular;  Laterality: Left;   CATARACT EXTRACTION     Ex lap with ileocecectomy  09/27/2013   Dr. Brantley Stage   HARDWARE REMOVAL Right 06/13/2021   Procedure: HARDWARE REMOVAL;  Surgeon: Marchia Bond, MD;  Location: [place];  Service: Orthopedics;  Laterality: Right;   ILEOCECETOMY     INCISION AND DRAINAGE     INCISIONAL HERNIA REPAIR N/A 08/11/2018   Procedure: OPEN RETRORECTUS REPAIR OF INCARCERATED INCISIONAL HERNIA WITH BILATERAL POSTERIOR COMPONENT SEPARATION;  Surgeon: [name], MD;  Location: [place];  Service: General;  Laterality: N/A;   INSERTION OF MESH  08/11/2018   Procedure: INSERTION OF PHASIX MESH;  Surgeon: [name], MD;  Location: [place];  Service: General;;   insertion of PD catheter  12/2017   IR FLUORO GUIDE CV LINE RIGHT  08/08/2018   IR US GUIDE VASC ACCESS RIGHT  08/08/2018   LAPAROSCOPIC ABDOMINAL EXPLORATION N/A 09/27/2013   LAPAROSCOPIC ASSISTED VENTRAL HERNIA REPAIR  2019    LAPAROSCOPY N/A 09/27/2013   Procedure: LAPAROSCOPY DIAGNOSTIC;  Surgeon: Erroll Luna, MD;  Location: [place];  Service: General;  Laterality: N/A;   LAPAROTOMY  08/11/2018   Procedure: EXPLORATORY LAPAROTOMY;  Surgeon: [name], MD;  Location: [place];  Service: General;;   LYSIS OF ADHESION  08/11/2018   Procedure: LYSIS OF ADHESIONS X 30 MINUTES;  Surgeon: [name], MD;  Location: [place];  Service: General;;   MINOR REMOVAL OF PERITONEAL DIALYSIS CATHETER N/A 01/06/2019   Procedure: REMOVAL OF PERITONEAL DIALYSIS CATHETER;  Surgeon: Kieth Brightly Arta Bruce, MD;  [facility];  Service: General;  Laterality: N/A;   OPEN REDUCTION INTERNAL FIXATION (ORIF) TIBIA/FIBULA FRACTURE Right 03/07/2019   Procedure: OPEN REDUCTION INTERNAL FIXATION (ORIF) FIBULA FRACTURE;  Surgeon: Marchia Bond, MD;  Location: [place];  Service: Orthopedics;  Laterality: Right;   ORIF TIBIA PLATEAU Right 03/07/2019   Procedure: Open Reduction Internal Fixation (Orif) Tibial Plateau;  Surgeon: Marchia Bond, MD;  Location: [place];  Service: Orthopedics;  Laterality: Right;   SHOULDER SURGERY Left 1989   TOTAL KNEE ARTHROPLASTY Right 08/08/2021   Procedure: TOTAL KNEE ARTHROPLASTY;  Surgeon: Marchia Bond, MD;  Location: [place];  Service: Orthopedics;  Laterality: Right;   Patient Active Problem List  Diagnosis Date Noted   Arthrosis of knee 08/08/2021   S/P total knee replacement, right 08/08/2021   Tibial plateau fracture 03/06/2019   ESRD (end stage renal disease) ([place]) 08/08/2018   HTN (hypertension) 08/08/2018   SBO (small bowel obstruction) ([place]) 43/32/9518   Periumbilical hernia 84/16/6063   Abdominal hernia as complication of peritoneal dialysis 08/08/2018   Psoriasis 09/29/2013   Acute gangrenous appendicitis with perforation and peritonitis 09/27/2013   Type 1 diabetes mellitus ([place]) 09/27/2013   Acute kidney injury ([place]) 09/27/2013   DM (diabetes mellitus) type 2, uncontrolled, with ketoacidosis  ([place]) 07/17/2012   DKA (diabetic ketoacidosis) ([place]) 07/16/2012   Perirectal abscess 07/14/2012    PCP: Haywood Pao  REFERRING PROVIDER: Marchia Bond, MD   REFERRING DIAG: Diagnosis [phone] (ICD-10-CM) - Status post right knee replacement   THERAPY DIAG:  Muscle weakness (generalized)  Difficulty in walking, not elsewhere classified  Localized edema  Rationale for Evaluation and Treatment Rehabilitation  ONSET DATE: 08/08/21  SUBJECTIVE:   SUBJECTIVE STATEMENT: Amb in without AD. Missing knee ext and lacking some flexion PERTINENT HISTORY: Patellar fracture 2 years ago. DM, dialysis M, W, F  PAIN:  Are you having pain? Yes 4/10 mostly stiffness/tightness. Pt is very swollen  PRECAUTIONS: None  WEIGHT BEARING RESTRICTIONS No  FALLS:  Has patient fallen in last 6 months? No  LIVING ENVIRONMENT: Lives with: lives with their spouse Lives in: House/apartment Stairs: Yes: External: 12 steps; on right going up Has following equipment at home: Walker - 2 wheeled, cane, has BSC, but not using.  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  PLOF: Independent  PATIENT GOALS Get back to as normal as possible.   OBJECTIVE:   PATIENT SURVEYS:  FOTO 46.7  COGNITION:  Overall cognitive status: Within functional limits for tasks assessed     SENSATION: WFL  EDEMA: R LE heavily bandaged, swelling present.  POSTURE: No Significant postural limitations  PALPATION: Deferred R knee heavily bandaged  LOWER EXTREMITY ROM: Other ROM grossly WFL  Passive ROM Right eval Left eval  Hip flexion    Hip extension    Hip abduction    Hip adduction    Hip internal rotation    Hip external rotation    Knee flexion 91   Knee extension 23   Ankle dorsiflexion    Ankle plantarflexion    Ankle inversion    Ankle eversion     (Blank rows = not tested)  LOWER EXTREMITY MMT:  MMT Right eval Left eval  Hip flexion 3+ 4  Hip extension 3+ 4  Hip  abduction 3 4  Hip adduction    Hip internal rotation    Hip external rotation    Knee flexion 3- 4  Knee extension 2+ 4  Ankle dorsiflexion 3+ 4  Ankle plantarflexion    Ankle inversion    Ankle eversion     (Blank rows = not tested)  GAIT: Distance walked: 60' Assistive device utilized: Environmental consultant - 2 wheeled Level of assistance: Modified independence Comments: Step to gait pattern, walker too low, raised 1 notch, may be able to raise one more. Antalgic on R with decreased WB and stance time.  08/15/21 Nustep L 5 5 min- good ROM Seated LAQ 2 set 10, HS curl red tband 2 sets 10, TKE red tband 2 sets 10 PROM flex and ext, gentle patellar mobs- AROM after seated 17-92 ( Pt has significant guarding with passive flexion, tolerates ext fairly well) Supine SAQ  into ball 2 sets 10 Feet on ball bridge and KTC 2 sets 10 each Standing RT LE HS curl and march 10 each 2 sets VASO 10 min   TODAY'S TREATMENT: AAROM,  Ankle pumps, quad sets, short kicks, SLR, heel slides, SLR, seated knee flexion and ext, 5 reps each   PATIENT EDUCATION:  Education details: HEP,  Person educated: Patient Education method: Explanation, Demonstration, and Handouts Education comprehension: verbalized understanding and returned demonstration   HOME EXERCISE PROGRAM:  [id number]  ASSESSMENT:  CLINICAL IMPRESSION: Pt arrived amb without AD. Fair balance but lacking TKE with heel strike and missing flexion in swing phase. Pt  with significant swelling, verb icing and rec elevation above heart. Pt states doing HEP some as issued, states makes him sore so rec decreasing reps and see if that helps.Pt tolerated initial progression of ther ex well. Resistant to passive flexion -very guarded.  OBJECTIVE IMPAIRMENTS Abnormal gait, decreased activity tolerance, decreased balance, decreased coordination, decreased endurance, difficulty walking, decreased ROM, decreased strength, increased edema, impaired flexibility,  postural dysfunction, and pain.   ACTIVITY LIMITATIONS carrying, lifting, bending, sitting, standing, squatting, stairs, transfers, bed mobility, bathing, toileting, dressing, hygiene/grooming, and locomotion level  PARTICIPATION LIMITATIONS: meal prep, cleaning, laundry, shopping, community activity, occupation, and yard work  PERSONAL FACTORS Past/current experiences and 1 comorbidity: DM with dialysis,  are also affecting patient's functional outcome.   REHAB POTENTIAL: Good  CLINICAL DECISION MAKING: Evolving/moderate complexity  EVALUATION COMPLEXITY: Moderate   GOALS: Goals reviewed with patient? Yes  SHORT TERM GOALS: Target date: 09/07/2021  I with basic HEP Baseline: Goal status: met  2.  Increase R knee ROM to 15-110 degrees. Baseline: 23-91 Goal status: INITIAL   LONG TERM GOALS: Target date: 11/02/2021   I with final HEP Baseline:  Goal status: INITIAL  2.  Improve R knee ROM to 5-120 Baseline: 23-91 Goal status: INITIAL  3.  Increased RLE strength to at least 4-/5 throughout Baseline: 2+-3-/5 Goal status: INITIAL  4.  Increase FOTO to at least 72 Baseline: 46.7 Goal status: INITIAL  5.  Patient will ambulate I on level and unlevel surfaces x at least 1000' Baseline: RW, 80', multiple deviations. Goal status: INITIAL  PLAN: PT FREQUENCY:  2-3x/week  PT DURATION: 12 weeks  PLANNED INTERVENTIONS: Therapeutic exercises, Therapeutic activity, Neuromuscular re-education, Balance training, Gait training, Patient/Family education, Self Care, Joint mobilization, Stair training, Electrical stimulation, Cryotherapy, Moist heat, Compression bandaging, Vasopneumatic device, Manual therapy, and Re-evaluation  PLAN FOR NEXT SESSION: Update HEP as appropriate, ROM, strengthening   Encounter Date: 08/15/2021   Laqueta Carina, PTA 08/15/2021, 9:13 AM  [facility]. [place], [place],  [address] Phone: [phone]   Fax:  [phone]

## 2021-08-17 ENCOUNTER — Encounter: Payer: Self-pay | Admitting: Physical Therapy

## 2021-08-17 ENCOUNTER — Ambulatory Visit: Payer: BC Managed Care – PPO | Admitting: Physical Therapy

## 2021-08-17 DIAGNOSIS — R262 Difficulty in walking, not elsewhere classified: Secondary | ICD-10-CM

## 2021-08-17 DIAGNOSIS — R600 Localized edema: Secondary | ICD-10-CM

## 2021-08-17 DIAGNOSIS — M6281 Muscle weakness (generalized): Secondary | ICD-10-CM

## 2021-08-17 NOTE — Therapy (Addendum)
OUTPATIENT PHYSICAL THERAPY LOWER EXTREMITY  Patient Name: Alex Morrison MRN: [id number] DOB:17-Sep-1970, 51 y.o., male Today's Date: 08/17/2021   PT End of Session - 08/17/21 0804     Visit Number 3    Date for PT Re-Evaluation 11/02/21    PT Start Time 0858    PT Stop Time 0845    PT Time Calculation (min) 1427 min    Activity Tolerance Patient tolerated treatment well    Behavior During Therapy [facility] for tasks assessed/performed             Past Medical History:  Diagnosis Date   Abscess of buttock    Acute kidney injury ([place]) 09/27/2013   Depression    Diabetes mellitus without complication ([place])    Diabetic neuropathy ([place])    Dialysis patient ([place])    M,W,F   ESRD (end stage renal disease) ([place])    Glaucoma    HTN (hypertension) 08/08/2018   Obesity    Perforated appendix    Peritonitis   Psoriasis    Retinopathy    Past Surgical History:  Procedure Laterality Date   ABSCESS      RECTAL    APPENDECTOMY     AV FISTULA PLACEMENT Left 01/13/2019   Procedure: LEFT ARM ARTERIOVENOUS (AV) FISTULA CREATION;  Surgeon: Elam Dutch, MD;  Location: [place];  Service: Vascular;  Laterality: Left;   CATARACT EXTRACTION     Ex lap with ileocecectomy  09/27/2013   Dr. Brantley Stage   HARDWARE REMOVAL Right 06/13/2021   Procedure: HARDWARE REMOVAL;  Surgeon: Marchia Bond, MD;  Location: [place];  Service: Orthopedics;  Laterality: Right;   ILEOCECETOMY     INCISION AND DRAINAGE     INCISIONAL HERNIA REPAIR N/A 08/11/2018   Procedure: OPEN RETRORECTUS REPAIR OF INCARCERATED INCISIONAL HERNIA WITH BILATERAL POSTERIOR COMPONENT SEPARATION;  Surgeon: [name], MD;  Location: [place];  Service: General;  Laterality: N/A;   INSERTION OF MESH  08/11/2018   Procedure: INSERTION OF PHASIX MESH;  Surgeon: [name], MD;  Location: [place];  Service: General;;   insertion of PD catheter  12/2017   IR FLUORO GUIDE CV LINE RIGHT  08/08/2018   IR US GUIDE VASC ACCESS RIGHT  08/08/2018    LAPAROSCOPIC ABDOMINAL EXPLORATION N/A 09/27/2013   LAPAROSCOPIC ASSISTED VENTRAL HERNIA REPAIR  2019   LAPAROSCOPY N/A 09/27/2013   Procedure: LAPAROSCOPY DIAGNOSTIC;  Surgeon: Erroll Luna, MD;  Location: [place];  Service: General;  Laterality: N/A;   LAPAROTOMY  08/11/2018   Procedure: EXPLORATORY LAPAROTOMY;  Surgeon: [name], MD;  Location: [place];  Service: General;;   LYSIS OF ADHESION  08/11/2018   Procedure: LYSIS OF ADHESIONS X 30 MINUTES;  Surgeon: [name], MD;  Location: [place];  Service: General;;   MINOR REMOVAL OF PERITONEAL DIALYSIS CATHETER N/A 01/06/2019   Procedure: REMOVAL OF PERITONEAL DIALYSIS CATHETER;  Surgeon: Kieth Brightly Arta Bruce, MD;  [facility];  Service: General;  Laterality: N/A;   OPEN REDUCTION INTERNAL FIXATION (ORIF) TIBIA/FIBULA FRACTURE Right 03/07/2019   Procedure: OPEN REDUCTION INTERNAL FIXATION (ORIF) FIBULA FRACTURE;  Surgeon: Marchia Bond, MD;  Location: [place];  Service: Orthopedics;  Laterality: Right;   ORIF TIBIA PLATEAU Right 03/07/2019   Procedure: Open Reduction Internal Fixation (Orif) Tibial Plateau;  Surgeon: Marchia Bond, MD;  Location: [place];  Service: Orthopedics;  Laterality: Right;   SHOULDER SURGERY Left 1989   TOTAL KNEE ARTHROPLASTY Right 08/08/2021   Procedure: TOTAL KNEE ARTHROPLASTY;  Surgeon:  Marchia Bond, MD;  Location: [place];  Service: Orthopedics;  Laterality: Right;   Patient Active Problem List   Diagnosis Date Noted   Arthrosis of knee 08/08/2021   S/P total knee replacement, right 08/08/2021   Tibial plateau fracture 03/06/2019   ESRD (end stage renal disease) ([place]) 08/08/2018   HTN (hypertension) 08/08/2018   SBO (small bowel obstruction) ([place]) 41/66/0630   Periumbilical hernia 16/01/930   Abdominal hernia as complication of peritoneal dialysis 08/08/2018   Psoriasis 09/29/2013   Acute gangrenous appendicitis with perforation and peritonitis 09/27/2013   Type 1 diabetes mellitus ([place])  09/27/2013   Acute kidney injury ([place]) 09/27/2013   DM (diabetes mellitus) type 2, uncontrolled, with ketoacidosis ([place]) 07/17/2012   DKA (diabetic ketoacidosis) ([place]) 07/16/2012   Perirectal abscess 07/14/2012    PCP: Haywood Pao  REFERRING PROVIDER: Marchia Bond, MD   REFERRING DIAG: Diagnosis [phone] (ICD-10-CM) - Status post right knee replacement   THERAPY DIAG:  Muscle weakness (generalized)  Difficulty in walking, not elsewhere classified  Localized edema  Rationale for Evaluation and Treatment Rehabilitation  ONSET DATE: 08/08/21  SUBJECTIVE:   SUBJECTIVE STATEMENT: Amb in without AD. "Not great" PERTINENT HISTORY: Patellar fracture 2 years ago. DM, dialysis M, W, F  PAIN:  Are you having pain? Yes 6/10 mostly stiffness/tightness. Pt is very swollen  PRECAUTIONS: None  WEIGHT BEARING RESTRICTIONS No  FALLS:  Has patient fallen in last 6 months? No  LIVING ENVIRONMENT: Lives with: lives with their spouse Lives in: House/apartment Stairs: Yes: External: 12 steps; on right going up Has following equipment at home: Walker - 2 wheeled, cane, has BSC, but not using.  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  PLOF: Independent  PATIENT GOALS Get back to as normal as possible.   OBJECTIVE:   PATIENT SURVEYS:  FOTO 46.7  COGNITION:  Overall cognitive status: Within functional limits for tasks assessed     SENSATION: WFL  EDEMA: R LE heavily bandaged, swelling present.  POSTURE: No Significant postural limitations  PALPATION: Deferred R knee heavily bandaged  LOWER EXTREMITY ROM: Other ROM grossly WFL  Passive ROM Right eval Left eval  Hip flexion    Hip extension    Hip abduction    Hip adduction    Hip internal rotation    Hip external rotation    Knee flexion 91   Knee extension 23   Ankle dorsiflexion    Ankle plantarflexion    Ankle inversion    Ankle eversion     (Blank rows = not tested)  LOWER EXTREMITY  MMT:  MMT Right eval Left eval  Hip flexion 3+ 4  Hip extension 3+ 4  Hip abduction 3 4  Hip adduction    Hip internal rotation    Hip external rotation    Knee flexion 3- 4  Knee extension 2+ 4  Ankle dorsiflexion 3+ 4  Ankle plantarflexion    Ankle inversion    Ankle eversion     (Blank rows = not tested)  GAIT: Distance walked: 17' Assistive device utilized: Environmental consultant - 2 wheeled Level of assistance: Modified independence Comments: Step to gait pattern, walker too low, raised 1 notch, may be able to raise one more. Antalgic on R with decreased WB and stance time.  08/15/21 Nustep L 5 5 min- good ROM Seated LAQ 2 set 10, HS curl red tband 2 sets 10, TKE red tband 2 sets 10 PROM flex and ext, gentle patellar mobs- AROM after  seated 17-92 ( Pt has significant guarding with passive flexion, tolerates ext fairly well) Supine SAQ into ball 2 sets 10 Feet on ball bridge and KTC 2 sets 10 each Standing RT LE HS curl and march 10 each 2 sets VASO 10 min   TODAY'S TREATMENT: 08/17/21 NuStep L5 x 6 min    PROM flex and ext, gentle patellar mobs   Quad Sets RLE x10   LAQ 3lb RLE 2x10   RLE hamstring curls green 2x10   S2S 2x10   Leg Press 40lb 2x10, RLE 20lb 2x5  Vaso R knee low pressure, 34 deg x10 min  Eval AAROM,  Ankle pumps, quad sets, short kicks, SLR, heel slides, SLR, seated knee flexion and ext, 5 reps each   PATIENT EDUCATION:  Education details: HEP,  Person educated: Patient Education method: Explanation, Demonstration, and Handouts Education comprehension: verbalized understanding and returned demonstration   HOME EXERCISE PROGRAM:  [id number]  ASSESSMENT:  CLINICAL IMPRESSION: Pt arrived amb without AD. Fair balance but lacking TKE with heel strike and missing flexion in swing phase. Pt  with significant swelling to R knee. Continued with a progression of RLE strengthening. Pt had good ROM with all strengthening interventions. Some assist required with SL  leg press.     OBJECTIVE IMPAIRMENTS Abnormal gait, decreased activity tolerance, decreased balance, decreased coordination, decreased endurance, difficulty walking, decreased ROM, decreased strength, increased edema, impaired flexibility, postural dysfunction, and pain.   ACTIVITY LIMITATIONS carrying, lifting, bending, sitting, standing, squatting, stairs, transfers, bed mobility, bathing, toileting, dressing, hygiene/grooming, and locomotion level  PARTICIPATION LIMITATIONS: meal prep, cleaning, laundry, shopping, community activity, occupation, and yard work  PERSONAL FACTORS Past/current experiences and 1 comorbidity: DM with dialysis,  are also affecting patient's functional outcome.   REHAB POTENTIAL: Good  CLINICAL DECISION MAKING: Evolving/moderate complexity  EVALUATION COMPLEXITY: Moderate   GOALS: Goals reviewed with patient? Yes  SHORT TERM GOALS: Target date: 09/07/2021  I with basic HEP Baseline: Goal status: met  2.  Increase R knee ROM to 15-110 degrees. Baseline: 23-91 Goal status: ongoing   LONG TERM GOALS: Target date: 11/02/2021   I with final HEP Baseline:  Goal status: INITIAL  2.  Improve R knee ROM to 5-120 Baseline: 23-91 Goal status: INITIAL  3.  Increased RLE strength to at least 4-/5 throughout Baseline: 2+-3-/5 Goal status: INITIAL  4.  Increase FOTO to at least 72 Baseline: 46.7 Goal status: INITIAL  5.  Patient will ambulate I on level and unlevel surfaces x at least 1000' Baseline: RW, 80', multiple deviations. Goal status: INITIAL  PLAN: PT FREQUENCY:  2-3x/week  PT DURATION: 12 weeks  PLANNED INTERVENTIONS: Therapeutic exercises, Therapeutic activity, Neuromuscular re-education, Balance training, Gait training, Patient/Family education, Self Care, Joint mobilization, Stair training, Electrical stimulation, Cryotherapy, Moist heat, Compression bandaging, Vasopneumatic device, Manual therapy, and Re-evaluation  PLAN FOR NEXT  SESSION: Update HEP as appropriate, ROM, strengthening   Encounter Date: 08/17/2021   Scot Jun, PTA 08/17/2021, 8:05 AM  [place]. [place], [place], [address] Phone: [phone]   Fax:  [phone]

## 2021-08-22 ENCOUNTER — Encounter: Payer: Self-pay | Admitting: Physical Therapy

## 2021-08-22 ENCOUNTER — Ambulatory Visit: Payer: BC Managed Care – PPO | Admitting: Physical Therapy

## 2021-08-22 DIAGNOSIS — R2681 Unsteadiness on feet: Secondary | ICD-10-CM

## 2021-08-22 DIAGNOSIS — M6281 Muscle weakness (generalized): Secondary | ICD-10-CM

## 2021-08-22 DIAGNOSIS — R262 Difficulty in walking, not elsewhere classified: Secondary | ICD-10-CM

## 2021-08-22 DIAGNOSIS — R600 Localized edema: Secondary | ICD-10-CM

## 2021-08-22 NOTE — Therapy (Signed)
OUTPATIENT PHYSICAL THERAPY LOWER EXTREMITY  Patient Name: Alex Morrison MRN: [id number] DOB:April 15, 1970, 51 y.o., male Today's Date: 08/22/2021   PT End of Session - 08/22/21 0923     Visit Number 4    Date for PT Re-Evaluation 11/02/21    PT Start Time 0928    PT Stop Time 3710    PT Time Calculation (min) 47 min    Activity Tolerance Patient tolerated treatment well    Behavior During Therapy [facility] for tasks assessed/performed             Past Medical History:  Diagnosis Date   Abscess of buttock    Acute kidney injury ([place]) 09/27/2013   Depression    Diabetes mellitus without complication ([place])    Diabetic neuropathy ([place])    Dialysis patient ([place])    M,W,F   ESRD (end stage renal disease) ([place])    Glaucoma    HTN (hypertension) 08/08/2018   Obesity    Perforated appendix    Peritonitis   Psoriasis    Retinopathy    Past Surgical History:  Procedure Laterality Date   ABSCESS      RECTAL    APPENDECTOMY     AV FISTULA PLACEMENT Left 01/13/2019   Procedure: LEFT ARM ARTERIOVENOUS (AV) FISTULA CREATION;  Surgeon: Elam Dutch, MD;  Location: [place];  Service: Vascular;  Laterality: Left;   CATARACT EXTRACTION     Ex lap with ileocecectomy  09/27/2013   Dr. Brantley Stage   HARDWARE REMOVAL Right 06/13/2021   Procedure: HARDWARE REMOVAL;  Surgeon: Marchia Bond, MD;  Location: [place];  Service: Orthopedics;  Laterality: Right;   ILEOCECETOMY     INCISION AND DRAINAGE     INCISIONAL HERNIA REPAIR N/A 08/11/2018   Procedure: OPEN RETRORECTUS REPAIR OF INCARCERATED INCISIONAL HERNIA WITH BILATERAL POSTERIOR COMPONENT SEPARATION;  Surgeon: [name], MD;  Location: [place];  Service: General;  Laterality: N/A;   INSERTION OF MESH  08/11/2018   Procedure: INSERTION OF PHASIX MESH;  Surgeon: [name], MD;  Location: [place];  Service: General;;   insertion of PD catheter  12/2017   IR FLUORO GUIDE CV LINE RIGHT  08/08/2018   IR US GUIDE VASC ACCESS RIGHT  08/08/2018    LAPAROSCOPIC ABDOMINAL EXPLORATION N/A 09/27/2013   LAPAROSCOPIC ASSISTED VENTRAL HERNIA REPAIR  2019   LAPAROSCOPY N/A 09/27/2013   Procedure: LAPAROSCOPY DIAGNOSTIC;  Surgeon: Erroll Luna, MD;  Location: [place];  Service: General;  Laterality: N/A;   LAPAROTOMY  08/11/2018   Procedure: EXPLORATORY LAPAROTOMY;  Surgeon: [name], MD;  Location: [place];  Service: General;;   LYSIS OF ADHESION  08/11/2018   Procedure: LYSIS OF ADHESIONS X 30 MINUTES;  Surgeon: [name], MD;  Location: [place];  Service: General;;   MINOR REMOVAL OF PERITONEAL DIALYSIS CATHETER N/A 01/06/2019   Procedure: REMOVAL OF PERITONEAL DIALYSIS CATHETER;  Surgeon: Kieth Brightly Arta Bruce, MD;  [facility];  Service: General;  Laterality: N/A;   OPEN REDUCTION INTERNAL FIXATION (ORIF) TIBIA/FIBULA FRACTURE Right 03/07/2019   Procedure: OPEN REDUCTION INTERNAL FIXATION (ORIF) FIBULA FRACTURE;  Surgeon: Marchia Bond, MD;  Location: [place];  Service: Orthopedics;  Laterality: Right;   ORIF TIBIA PLATEAU Right 03/07/2019   Procedure: Open Reduction Internal Fixation (Orif) Tibial Plateau;  Surgeon: Marchia Bond, MD;  Location: [place];  Service: Orthopedics;  Laterality: Right;   SHOULDER SURGERY Left 1989   TOTAL KNEE ARTHROPLASTY Right 08/08/2021   Procedure: TOTAL KNEE ARTHROPLASTY;  Surgeon:  Marchia Bond, MD;  Location: [place];  Service: Orthopedics;  Laterality: Right;   Patient Active Problem List   Diagnosis Date Noted   Arthrosis of knee 08/08/2021   S/P total knee replacement, right 08/08/2021   Tibial plateau fracture 03/06/2019   ESRD (end stage renal disease) ([place]) 08/08/2018   HTN (hypertension) 08/08/2018   SBO (small bowel obstruction) ([place]) 50/09/3816   Periumbilical hernia 29/93/7169   Abdominal hernia as complication of peritoneal dialysis 08/08/2018   Psoriasis 09/29/2013   Acute gangrenous appendicitis with perforation and peritonitis 09/27/2013   Type 1 diabetes mellitus ([place])  09/27/2013   Acute kidney injury ([place]) 09/27/2013   DM (diabetes mellitus) type 2, uncontrolled, with ketoacidosis ([place]) 07/17/2012   DKA (diabetic ketoacidosis) ([place]) 07/16/2012   Perirectal abscess 07/14/2012    PCP: Haywood Pao  REFERRING PROVIDER: Marchia Bond, MD   REFERRING DIAG: Diagnosis [phone] (ICD-10-CM) - Status post right knee replacement   THERAPY DIAG:  Muscle weakness (generalized)  Difficulty in walking, not elsewhere classified  Localized edema  Unsteadiness on feet  Rationale for Evaluation and Treatment Rehabilitation  ONSET DATE: 08/08/21  SUBJECTIVE:   SUBJECTIVE STATEMENT:  Pt reports that he is not feeling well, stating stomach issues after dialysis   PERTINENT HISTORY: Patellar fracture 2 years ago. DM, dialysis M, W, F  PAIN:  Are you having pain? Yes 0/10 mostly stiffness/tightness. Pt is very swollen  PRECAUTIONS: None  WEIGHT BEARING RESTRICTIONS No  FALLS:  Has patient fallen in last 6 months? No  LIVING ENVIRONMENT: Lives with: lives with their spouse Lives in: House/apartment Stairs: Yes: External: 12 steps; on right going up Has following equipment at home: Walker - 2 wheeled, cane, has BSC, but not using.  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  PLOF: Independent  PATIENT GOALS Get back to as normal as possible.   OBJECTIVE:   EDEMA: R LE heavily bandaged, swelling present.  POSTURE: No Significant postural limitations  PALPATION: Deferred R knee heavily bandaged  LOWER EXTREMITY ROM: Other ROM grossly WFL  Passive ROM Right eval Left eval  Hip flexion    Hip extension    Hip abduction    Hip adduction    Hip internal rotation    Hip external rotation    Knee flexion 91   Knee extension 23   Ankle dorsiflexion    Ankle plantarflexion    Ankle inversion    Ankle eversion     (Blank rows = not tested)  LOWER EXTREMITY MMT:  MMT Right eval Left eval  Hip flexion 3+ 4  Hip  extension 3+ 4  Hip abduction 3 4  Hip adduction    Hip internal rotation    Hip external rotation    Knee flexion 3- 4  Knee extension 2+ 4  Ankle dorsiflexion 3+ 4  Ankle plantarflexion    Ankle inversion    Ankle eversion     (Blank rows = not tested)  GAIT: Distance walked: 49' Assistive device utilized: Environmental consultant - 2 wheeled Level of assistance: Modified independence Comments: Step to gait pattern, walker too low, raised 1 notch, may be able to raise one more. Antalgic on R with decreased WB and stance time.  08/15/21 Nustep L 5 5 min- good ROM Seated LAQ 2 set 10, HS curl red tband 2 sets 10, TKE red tband 2 sets 10 PROM flex and ext, gentle patellar mobs- AROM after seated 17-92 ( Pt has significant guarding with passive flexion,  tolerates ext fairly well) Supine SAQ into ball 2 sets 10 Feet on ball bridge and KTC 2 sets 10 each Standing RT LE HS curl and march 10 each 2 sets VASO 10 min   TODAY'S TREATMENT: 08/22/21 NuStep L4  x6 min LE only  S2S 2x10 R knee PROM  LAQ 3lb 2x10 HS curls green 2x10 Hamstring curls 25lb 2x10  Leg extensions 10lb 2x10     Vaso R knee low pressure, 34 deg x10 min   08/17/21 NuStep L5 x 6 min    PROM flex and ext, gentle patellar mobs   Quad Sets RLE x10   LAQ 3lb RLE 2x10   RLE hamstring curls green 2x10   S2S 2x10   Leg Press 40lb 2x10, RLE 20lb 2x5  Vaso R knee low pressure, 34 deg x10 min  Eval AAROM,  Ankle pumps, quad sets, short kicks, SLR, heel slides, SLR, seated knee flexion and ext, 5 reps each   PATIENT EDUCATION:  Education details: HEP,  Person educated: Patient Education method: Consulting civil engineer, Demonstration, and Handouts Education comprehension: verbalized understanding and returned demonstration   HOME EXERCISE PROGRAM:  [id number]  ASSESSMENT:  CLINICAL IMPRESSION: Pt arrived amb without AD reporting that he isn't feeling well due to having stomach issues for the past few days. Fair balance but lacking TKE  with heel strike and missing flexion in swing phase. Pt  with significant swelling to R knee. Continued with a progression of RLE strengthening but at lesser intensities due to subjective reports. Cue needed to hole quad contraction with LAQ. Some pain at the end range of passive flexion.   OBJECTIVE IMPAIRMENTS Abnormal gait, decreased activity tolerance, decreased balance, decreased coordination, decreased endurance, difficulty walking, decreased ROM, decreased strength, increased edema, impaired flexibility, postural dysfunction, and pain.   ACTIVITY LIMITATIONS carrying, lifting, bending, sitting, standing, squatting, stairs, transfers, bed mobility, bathing, toileting, dressing, hygiene/grooming, and locomotion level  PARTICIPATION LIMITATIONS: meal prep, cleaning, laundry, shopping, community activity, occupation, and yard work  PERSONAL FACTORS Past/current experiences and 1 comorbidity: DM with dialysis,  are also affecting patient's functional outcome.   REHAB POTENTIAL: Good  CLINICAL DECISION MAKING: Evolving/moderate complexity  EVALUATION COMPLEXITY: Moderate   GOALS: Goals reviewed with patient? Yes  SHORT TERM GOALS: Target date: 09/07/2021  I with basic HEP Baseline: Goal status: met  2.  Increase R knee ROM to 15-110 degrees. Baseline: 23-91 Goal status: ongoing   LONG TERM GOALS: Target date: 11/02/2021   I with final HEP Baseline:  Goal status: INITIAL  2.  Improve R knee ROM to 5-120 Baseline: 23-91 Goal status: INITIAL  3.  Increased RLE strength to at least 4-/5 throughout Baseline: 2+-3-/5 Goal status: INITIAL  4.  Increase FOTO to at least 72 Baseline: 46.7 Goal status: INITIAL  5.  Patient will ambulate I on level and unlevel surfaces x at least 1000' Baseline: RW, 80', multiple deviations. Goal status: INITIAL  PLAN: PT FREQUENCY:  2-3x/week  PT DURATION: 12 weeks  PLANNED INTERVENTIONS: Therapeutic exercises, Therapeutic activity,  Neuromuscular re-education, Balance training, Gait training, Patient/Family education, Self Care, Joint mobilization, Stair training, Electrical stimulation, Cryotherapy, Moist heat, Compression bandaging, Vasopneumatic device, Manual therapy, and Re-evaluation  PLAN FOR NEXT SESSION: Update HEP as appropriate, ROM, strengthening   Encounter Date: 08/22/2021   Scot Jun, PTA 08/22/2021, 9:24 AM  [place]. [place], [place], [address] Phone: [phone]   Fax:  [phone]

## 2021-08-24 ENCOUNTER — Ambulatory Visit: Payer: BC Managed Care – PPO | Admitting: Physical Therapy

## 2021-08-24 ENCOUNTER — Encounter: Payer: Self-pay | Admitting: Physical Therapy

## 2021-08-24 DIAGNOSIS — R600 Localized edema: Secondary | ICD-10-CM

## 2021-08-24 DIAGNOSIS — M6281 Muscle weakness (generalized): Secondary | ICD-10-CM | POA: Diagnosis not present

## 2021-08-24 DIAGNOSIS — R278 Other lack of coordination: Secondary | ICD-10-CM

## 2021-08-24 DIAGNOSIS — R262 Difficulty in walking, not elsewhere classified: Secondary | ICD-10-CM

## 2021-08-24 DIAGNOSIS — R2689 Other abnormalities of gait and mobility: Secondary | ICD-10-CM

## 2021-08-24 DIAGNOSIS — R2681 Unsteadiness on feet: Secondary | ICD-10-CM

## 2021-08-24 DIAGNOSIS — M25561 Pain in right knee: Secondary | ICD-10-CM

## 2021-08-24 NOTE — Therapy (Signed)
OUTPATIENT PHYSICAL THERAPY LOWER EXTREMITY  Patient Name: Alex Morrison MRN: [id number] DOB:1971-01-07, 51 y.o., male Today's Date: 08/24/2021   PT End of Session - 08/24/21 0931     Visit Number 5    Date for PT Re-Evaluation 11/02/21    PT Start Time 0928    PT Stop Time 1010    PT Time Calculation (min) 42 min    Activity Tolerance Patient tolerated treatment well    Behavior During Therapy [facility] for tasks assessed/performed              Past Medical History:  Diagnosis Date   Abscess of buttock    Acute kidney injury ([place]) 09/27/2013   Depression    Diabetes mellitus without complication ([place])    Diabetic neuropathy ([place])    Dialysis patient ([place])    M,W,F   ESRD (end stage renal disease) ([place])    Glaucoma    HTN (hypertension) 08/08/2018   Obesity    Perforated appendix    Peritonitis   Psoriasis    Retinopathy    Past Surgical History:  Procedure Laterality Date   ABSCESS      RECTAL    APPENDECTOMY     AV FISTULA PLACEMENT Left 01/13/2019   Procedure: LEFT ARM ARTERIOVENOUS (AV) FISTULA CREATION;  Surgeon: Elam Dutch, MD;  Location: [place];  Service: Vascular;  Laterality: Left;   CATARACT EXTRACTION     Ex lap with ileocecectomy  09/27/2013   Dr. Brantley Stage   HARDWARE REMOVAL Right 06/13/2021   Procedure: HARDWARE REMOVAL;  Surgeon: Marchia Bond, MD;  Location: [place];  Service: Orthopedics;  Laterality: Right;   ILEOCECETOMY     INCISION AND DRAINAGE     INCISIONAL HERNIA REPAIR N/A 08/11/2018   Procedure: OPEN RETRORECTUS REPAIR OF INCARCERATED INCISIONAL HERNIA WITH BILATERAL POSTERIOR COMPONENT SEPARATION;  Surgeon: [name], MD;  Location: [place];  Service: General;  Laterality: N/A;   INSERTION OF MESH  08/11/2018   Procedure: INSERTION OF PHASIX MESH;  Surgeon: [name], MD;  Location: [place];  Service: General;;   insertion of PD catheter  12/2017   IR FLUORO GUIDE CV LINE RIGHT  08/08/2018   IR US GUIDE VASC ACCESS RIGHT   08/08/2018   LAPAROSCOPIC ABDOMINAL EXPLORATION N/A 09/27/2013   LAPAROSCOPIC ASSISTED VENTRAL HERNIA REPAIR  2019   LAPAROSCOPY N/A 09/27/2013   Procedure: LAPAROSCOPY DIAGNOSTIC;  Surgeon: Erroll Luna, MD;  Location: [place];  Service: General;  Laterality: N/A;   LAPAROTOMY  08/11/2018   Procedure: EXPLORATORY LAPAROTOMY;  Surgeon: [name], MD;  Location: [place];  Service: General;;   LYSIS OF ADHESION  08/11/2018   Procedure: LYSIS OF ADHESIONS X 30 MINUTES;  Surgeon: [name], MD;  Location: [place];  Service: General;;   MINOR REMOVAL OF PERITONEAL DIALYSIS CATHETER N/A 01/06/2019   Procedure: REMOVAL OF PERITONEAL DIALYSIS CATHETER;  Surgeon: Kieth Brightly Arta Bruce, MD;  [facility];  Service: General;  Laterality: N/A;   OPEN REDUCTION INTERNAL FIXATION (ORIF) TIBIA/FIBULA FRACTURE Right 03/07/2019   Procedure: OPEN REDUCTION INTERNAL FIXATION (ORIF) FIBULA FRACTURE;  Surgeon: Marchia Bond, MD;  Location: [place];  Service: Orthopedics;  Laterality: Right;   ORIF TIBIA PLATEAU Right 03/07/2019   Procedure: Open Reduction Internal Fixation (Orif) Tibial Plateau;  Surgeon: Marchia Bond, MD;  Location: [place];  Service: Orthopedics;  Laterality: Right;   SHOULDER SURGERY Left 1989   TOTAL KNEE ARTHROPLASTY Right 08/08/2021   Procedure: TOTAL KNEE ARTHROPLASTY;  Surgeon: Landau, Joshua, MD;  [facility];  Service: Orthopedics;  Laterality: Right;   Patient Active Problem List   Diagnosis Date Noted   Arthrosis of knee 08/08/2021   S/P total knee replacement, right 08/08/2021   Tibial plateau fracture 03/06/2019   ESRD (end stage renal disease) (H[facility]) 08/08/2018   HTN (hypertension) 08/08/2018   SBO (small bowel obstruction) (HCC) 08/08/2018   Periumbilical hernia 08/08/2018   Abdominal hernia as complication of peritoneal dialysis 08/08/2018   Psoriasis 09/29/2013   Acute gangrenous appendicitis with perforation and peritonitis 09/27/2013   Type 1 diabetes mellitus  (HCC) 09/27/2013   Acute kidney injury (HCC) 09/27/2013   DM (diabetes mellitus) type 2, uncontrolled, with ketoacidosis (HCC) 07/17/2012   DKA (diabetic ketoacidosis) (HCC) 07/16/2012   Perirectal abscess 07/14/2012    PCP: Tisovec, Richard W  REFERRING PROVIDER: Landau, Joshua, MD   REFERRING DIAG: Diagnosis Z96.651 (ICD-10-CM) - Status post right knee replacement   THERAPY DIAG:  Muscle weakness (generalized)  Difficulty in walking, not elsewhere classified  Localized edema  Other abnormalities of gait and mobility  Other lack of coordination  Unsteadiness on feet  Acute pain of right knee  Rationale for Evaluation and Treatment Rehabilitation  ONSET DATE: 08/08/21  SUBJECTIVE:   SUBJECTIVE STATEMENT:  Pt reports that he still feels pretty bad, but a little better than on his last visit.   PERTINENT HISTORY: Patellar fracture 2 years ago. DM, dialysis M, W, F  PAIN:  Are you having pain? Yes 0/10 mostly stiffness/tightness. Pt is very swollen  PRECAUTIONS: None  WEIGHT BEARING RESTRICTIONS No  FALLS:  Has patient fallen in last 6 months? No  LIVING ENVIRONMENT: Lives with: lives with their spouse Lives in: House/apartment Stairs: Yes: External: 12 steps; on right going up Has following equipment at home: Walker - 2 wheeled, cane, has BSC, but not using.  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  PLOF: Independent  PATIENT GOALS Get back to as normal as possible.   OBJECTIVE:   EDEMA: R LE heavily bandaged, swelling present.  POSTURE: No Significant postural limitations  PALPATION: Deferred R knee heavily bandaged  LOWER EXTREMITY ROM: Other ROM grossly WFL  Passive ROM Right eval Left eval  Hip flexion    Hip extension    Hip abduction    Hip adduction    Hip internal rotation    Hip external rotation    Knee flexion 91   Knee extension 23   Ankle dorsiflexion    Ankle plantarflexion    Ankle inversion    Ankle  eversion     (Blank rows = not tested)  LOWER EXTREMITY MMT:  MMT Right eval Left eval  Hip flexion 3+ 4  Hip extension 3+ 4  Hip abduction 3 4  Hip adduction    Hip internal rotation    Hip external rotation    Knee flexion 3- 4  Knee extension 2+ 4  Ankle dorsiflexion 3+ 4  Ankle plantarflexion    Ankle inversion    Ankle eversion     (Blank rows = not tested)  GAIT: Distance walked: 80' Assistive device utilized: Walker - 2 wheeled Level of assistance: Modified independence Comments: Step to gait pattern, walker too low, raised 1 notch, may be able to raise one more. Antalgic on R with decreased WB and stance time.    TODAY'S TREATMENT: 08/24/21 NuStep L3-5 x 6 minutes Supine quad sets with heel elevated, 5 sec holds, mod overpressure, x   10 SAQ 10 reps, 5 sec holds SLR x 10 reps Seated long kick, knee flexion x 10 PROM 12-104 Knee flexion 25#, 2 x 10 reps Knee ext 2 x 10 10# Gait trainign without AD, VC for upright posture and R knee ext, with improved pattern noted. Vaso to R knee x 10 minutes, low pressure, 34 deg  08/22/21 NuStep L4  x6 min LE only  S2S 2x10 R knee PROM  LAQ 3lb 2x10 HS curls green 2x10 Hamstring curls 25lb 2x10  Leg extensions 10lb 2x10     Vaso R knee low pressure, 34 deg x10 min   08/17/21 NuStep L5 x 6 min    PROM flex and ext, gentle patellar mobs   Quad Sets RLE x10   LAQ 3lb RLE 2x10   RLE hamstring curls green 2x10   S2S 2x10   Leg Press 40lb 2x10, RLE 20lb 2x5  Vaso R knee low pressure, 34 deg x10 min  08/15/21 Nustep L 5 5 min- good ROM Seated LAQ 2 set 10, HS curl red tband 2 sets 10, TKE red tband 2 sets 10 PROM flex and ext, gentle patellar mobs- AROM after seated 17-92 ( Pt has significant guarding with passive flexion, tolerates ext fairly well) Supine SAQ into ball 2 sets 10 Feet on ball bridge and KTC 2 sets 10 each Standing RT LE HS curl and march 10 each 2 sets VASO 10 min   Eval AAROM,  Ankle pumps, quad  sets, short kicks, SLR, heel slides, SLR, seated knee flexion and ext, 5 reps each   PATIENT EDUCATION:  Education details: HEP,  Person educated: Patient Education method: Explanation, Demonstration, and Handouts Education comprehension: verbalized understanding and returned demonstration   HOME EXERCISE PROGRAM:  [id number]  ASSESSMENT:  CLINICAL IMPRESSION: Pt reports he still feels poorly, but better than last treatment. ROM and strength are improving in spite of continued swelling, with good effort. Gait improved with VC.   OBJECTIVE IMPAIRMENTS Abnormal gait, decreased activity tolerance, decreased balance, decreased coordination, decreased endurance, difficulty walking, decreased ROM, decreased strength, increased edema, impaired flexibility, postural dysfunction, and pain.   ACTIVITY LIMITATIONS carrying, lifting, bending, sitting, standing, squatting, stairs, transfers, bed mobility, bathing, toileting, dressing, hygiene/grooming, and locomotion level  PARTICIPATION LIMITATIONS: meal prep, cleaning, laundry, shopping, community activity, occupation, and yard work  PERSONAL FACTORS Past/current experiences and 1 comorbidity: DM with dialysis,  are also affecting patient's functional outcome.   REHAB POTENTIAL: Good  CLINICAL DECISION MAKING: Evolving/moderate complexity  EVALUATION COMPLEXITY: Moderate   GOALS: Goals reviewed with patient? Yes  SHORT TERM GOALS: Target date: 09/07/2021  I with basic HEP Baseline: Goal status: met  2.  Increase R knee ROM to 15-110 degrees. Baseline: 23-91 Goal status: ongoing   LONG TERM GOALS: Target date: 11/02/2021   I with final HEP Baseline:  Goal status: INITIAL  2.  Improve R knee ROM to 5-120 Baseline: 23-91 Goal status: INITIAL  3.  Increased RLE strength to at least 4-/5 throughout Baseline: 2+-3-/5 Goal status: INITIAL  4.  Increase FOTO to at least 72 Baseline: 46.7 Goal status: INITIAL  5.  Patient  will ambulate I on level and unlevel surfaces x at least 1000' Baseline: RW, 80', multiple deviations. Goal status: INITIAL  PLAN: PT FREQUENCY:  2-3x/week  PT DURATION: 12 weeks  PLANNED INTERVENTIONS: Therapeutic exercises, Therapeutic activity, Neuromuscular re-education, Balance training, Gait training, Patient/Family education, Self Care, Joint mobilization, Stair training, Electrical stimulation, Cryotherapy, Moist heat, Compression bandaging, Vasopneumatic device,   Manual therapy, and Re-evaluation  PLAN FOR NEXT SESSION: Update HEP as appropriate, ROM, strengthening   Encounter Date: 08/24/2021   Susan M Varga, DPT 08/24/2021, 10:02 AM  [facility] [address] [place], [place], [address] Phone: [phone]   Fax:  [phone] 

## 2021-08-29 ENCOUNTER — Encounter: Payer: Self-pay | Admitting: Physical Therapy

## 2021-08-29 ENCOUNTER — Ambulatory Visit: Payer: BC Managed Care – PPO | Admitting: Physical Therapy

## 2021-08-29 DIAGNOSIS — M6281 Muscle weakness (generalized): Secondary | ICD-10-CM | POA: Diagnosis not present

## 2021-08-29 DIAGNOSIS — R262 Difficulty in walking, not elsewhere classified: Secondary | ICD-10-CM

## 2021-08-29 DIAGNOSIS — R2689 Other abnormalities of gait and mobility: Secondary | ICD-10-CM

## 2021-08-29 DIAGNOSIS — R600 Localized edema: Secondary | ICD-10-CM

## 2021-08-29 NOTE — Therapy (Signed)
OUTPATIENT PHYSICAL THERAPY LOWER EXTREMITY  Patient Name: Alex Morrison MRN: [id number] DOB:28-Sep-1970, 51 y.o., male Today's Date: 08/29/2021   PT End of Session - 08/29/21 0929     Visit Number 6    Date for PT Re-Evaluation 11/02/21    PT Start Time 0930    PT Stop Time 1021    PT Time Calculation (min) 51 min    Activity Tolerance Patient tolerated treatment well    Behavior During Therapy [facility] for tasks assessed/performed              Past Medical History:  Diagnosis Date   Abscess of buttock    Acute kidney injury ([place]) 09/27/2013   Depression    Diabetes mellitus without complication ([place])    Diabetic neuropathy ([place])    Dialysis patient ([place])    M,W,F   ESRD (end stage renal disease) ([place])    Glaucoma    HTN (hypertension) 08/08/2018   Obesity    Perforated appendix    Peritonitis   Psoriasis    Retinopathy    Past Surgical History:  Procedure Laterality Date   ABSCESS      RECTAL    APPENDECTOMY     AV FISTULA PLACEMENT Left 01/13/2019   Procedure: LEFT ARM ARTERIOVENOUS (AV) FISTULA CREATION;  Surgeon: Elam Dutch, MD;  Location: [place];  Service: Vascular;  Laterality: Left;   CATARACT EXTRACTION     Ex lap with ileocecectomy  09/27/2013   Dr. Brantley Stage   HARDWARE REMOVAL Right 06/13/2021   Procedure: HARDWARE REMOVAL;  Surgeon: Marchia Bond, MD;  Location: [place];  Service: Orthopedics;  Laterality: Right;   ILEOCECETOMY     INCISION AND DRAINAGE     INCISIONAL HERNIA REPAIR N/A 08/11/2018   Procedure: OPEN RETRORECTUS REPAIR OF INCARCERATED INCISIONAL HERNIA WITH BILATERAL POSTERIOR COMPONENT SEPARATION;  Surgeon: [name], MD;  Location: [place];  Service: General;  Laterality: N/A;   INSERTION OF MESH  08/11/2018   Procedure: INSERTION OF PHASIX MESH;  Surgeon: [name], MD;  Location: [place];  Service: General;;   insertion of PD catheter  12/2017   IR FLUORO GUIDE CV LINE RIGHT  08/08/2018   IR US GUIDE VASC ACCESS RIGHT   08/08/2018   LAPAROSCOPIC ABDOMINAL EXPLORATION N/A 09/27/2013   LAPAROSCOPIC ASSISTED VENTRAL HERNIA REPAIR  2019   LAPAROSCOPY N/A 09/27/2013   Procedure: LAPAROSCOPY DIAGNOSTIC;  Surgeon: Erroll Luna, MD;  Location: [place];  Service: General;  Laterality: N/A;   LAPAROTOMY  08/11/2018   Procedure: EXPLORATORY LAPAROTOMY;  Surgeon: [name], MD;  Location: [place];  Service: General;;   LYSIS OF ADHESION  08/11/2018   Procedure: LYSIS OF ADHESIONS X 30 MINUTES;  Surgeon: [name], MD;  Location: [place];  Service: General;;   MINOR REMOVAL OF PERITONEAL DIALYSIS CATHETER N/A 01/06/2019   Procedure: REMOVAL OF PERITONEAL DIALYSIS CATHETER;  Surgeon: Kieth Brightly Arta Bruce, MD;  [facility];  Service: General;  Laterality: N/A;   OPEN REDUCTION INTERNAL FIXATION (ORIF) TIBIA/FIBULA FRACTURE Right 03/07/2019   Procedure: OPEN REDUCTION INTERNAL FIXATION (ORIF) FIBULA FRACTURE;  Surgeon: Marchia Bond, MD;  Location: [place];  Service: Orthopedics;  Laterality: Right;   ORIF TIBIA PLATEAU Right 03/07/2019   Procedure: Open Reduction Internal Fixation (Orif) Tibial Plateau;  Surgeon: Marchia Bond, MD;  Location: [place];  Service: Orthopedics;  Laterality: Right;   SHOULDER SURGERY Left 1989   TOTAL KNEE ARTHROPLASTY Right 08/08/2021   Procedure: TOTAL KNEE ARTHROPLASTY;  Surgeon: Marchia Bond, MD;  Location: [place];  Service: Orthopedics;  Laterality: Right;   Patient Active Problem List   Diagnosis Date Noted   Arthrosis of knee 08/08/2021   S/P total knee replacement, right 08/08/2021   Tibial plateau fracture 03/06/2019   ESRD (end stage renal disease) ([place]) 08/08/2018   HTN (hypertension) 08/08/2018   SBO (small bowel obstruction) ([place]) 12/45/8099   Periumbilical hernia 83/38/2505   Abdominal hernia as complication of peritoneal dialysis 08/08/2018   Psoriasis 09/29/2013   Acute gangrenous appendicitis with perforation and peritonitis 09/27/2013   Type 1 diabetes mellitus  ([place]) 09/27/2013   Acute kidney injury ([place]) 09/27/2013   DM (diabetes mellitus) type 2, uncontrolled, with ketoacidosis ([place]) 07/17/2012   DKA (diabetic ketoacidosis) ([place]) 07/16/2012   Perirectal abscess 07/14/2012    PCP: [place] Pao  REFERRING PROVIDER: Marchia Bond, MD   REFERRING DIAG: Diagnosis [phone] (ICD-10-CM) - Status post right knee replacement   THERAPY DIAG:  Muscle weakness (generalized)  Difficulty in walking, not elsewhere classified  Localized edema  Other abnormalities of gait and mobility  Rationale for Evaluation and Treatment Rehabilitation  ONSET DATE: 08/08/21  SUBJECTIVE:   SUBJECTIVE STATEMENT: "Three weeks today" feeling ok   PERTINENT HISTORY: Patellar fracture 2 years ago. DM, dialysis M, W, F  PAIN:  Are you having pain? Yes 0/10 mostly stiffness/tightness. Pt is very swollen   FALLS:  Has patient fallen in last 6 months? No  LIVING ENVIRONMENT: Lives with: lives with their spouse Lives in: House/apartment Stairs: Yes: External: 12 steps; on right going up Has following equipment at home: Walker - 2 wheeled, cane, has BSC, but not using.  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  PLOF: Independent  PATIENT GOALS Get back to as normal as possible.   OBJECTIVE:   EDEMA: R LE heavily bandaged, swelling present.  POSTURE: No Significant postural limitations  PALPATION: Deferred R knee heavily bandaged  LOWER EXTREMITY ROM: Other ROM grossly WFL  Passive ROM Right eval Left eval  Hip flexion    Hip extension    Hip abduction    Hip adduction    Hip internal rotation    Hip external rotation    Knee flexion 91   Knee extension 23   Ankle dorsiflexion    Ankle plantarflexion    Ankle inversion    Ankle eversion     (Blank rows = not tested)  LOWER EXTREMITY MMT:  MMT Right eval Left eval  Hip flexion 3+ 4  Hip extension 3+ 4  Hip abduction 3 4  Hip adduction    Hip internal rotation     Hip external rotation    Knee flexion 3- 4  Knee extension 2+ 4  Ankle dorsiflexion 3+ 4  Ankle plantarflexion    Ankle inversion    Ankle eversion     (Blank rows = not tested)  GAIT: Distance walked: 94' Assistive device utilized: Environmental consultant - 2 wheeled Level of assistance: Modified independence Comments: Step to gait pattern, walker too low, raised 1 notch, may be able to raise one more. Antalgic on R with decreased WB and stance time.    TODAY'S TREATMENT: 08/29/21 NuStep L5 x 6 min  R knee PROM  S2S x10 4in step ups RLE 3x5 Hamstring curls RLE 15lb 2x10  Leg Ext RLE 5lb 2x10 Stairs 4 & 6 inch steps Alt pattern one rail R Leg press 40lb x10, 60lb x10, RLE 20lb x5 `  Vaso to  R knee x 10 minutes, med pressure, 34 deg  08/24/21 NuStep L3-5 x 6 minutes Supine quad sets with heel elevated, 5 sec holds, mod overpressure, x 10 SAQ 10 reps, 5 sec holds SLR x 10 reps Seated long kick, knee flexion x 10 PROM 12-104 Knee flexion 25#, 2 x 10 reps Knee ext 2 x 10 10# Gait trainign without AD, VC for upright posture and R knee ext, with improved pattern noted. Vaso to R knee x 10 minutes, low pressure, 34 deg  08/22/21 NuStep L4  x6 min LE only  S2S 2x10 R knee PROM  LAQ 3lb 2x10 HS curls green 2x10 Hamstring curls 25lb 2x10  Leg extensions 10lb 2x10     Vaso R knee low pressure, 34 deg x10 min   08/17/21 NuStep L5 x 6 min    PROM flex and ext, gentle patellar mobs   Quad Sets RLE x10   LAQ 3lb RLE 2x10   RLE hamstring curls green 2x10   S2S 2x10   Leg Press 40lb 2x10, RLE 20lb 2x5  Vaso R knee low pressure, 34 deg x10 min  08/15/21 Nustep L 5 5 min- good ROM Seated LAQ 2 set 10, HS curl red tband 2 sets 10, TKE red tband 2 sets 10 PROM flex and ext, gentle patellar mobs- AROM after seated 17-92 ( Pt has significant guarding with passive flexion, tolerates ext fairly well) Supine SAQ into ball 2 sets 10 Feet on ball bridge and KTC 2 sets 10 each Standing RT LE HS curl  and march 10 each 2 sets VASO 10 min   Eval AAROM,  Ankle pumps, quad sets, short kicks, SLR, heel slides, SLR, seated knee flexion and ext, 5 reps each   PATIENT EDUCATION:  Education details: HEP,  Person educated: Patient Education method: Explanation, Demonstration, and Handouts Education comprehension: verbalized understanding and returned demonstration   HOME EXERCISE PROGRAM:  [id number]  ASSESSMENT:  CLINICAL IMPRESSION: Pt enter reporting that he is feeling better today. R knee remains very swollen and warm to touch. Cue to prevent compensation needed with sit to stands. RLE weakness present with step ups and stairs bt he was able to complete. Assist needed with SL on leg  press.   OBJECTIVE IMPAIRMENTS Abnormal gait, decreased activity tolerance, decreased balance, decreased coordination, decreased endurance, difficulty walking, decreased ROM, decreased strength, increased edema, impaired flexibility, postural dysfunction, and pain.   ACTIVITY LIMITATIONS carrying, lifting, bending, sitting, standing, squatting, stairs, transfers, bed mobility, bathing, toileting, dressing, hygiene/grooming, and locomotion level  PARTICIPATION LIMITATIONS: meal prep, cleaning, laundry, shopping, community activity, occupation, and yard work  PERSONAL FACTORS Past/current experiences and 1 comorbidity: DM with dialysis,  are also affecting patient's functional outcome.   REHAB POTENTIAL: Good  CLINICAL DECISION MAKING: Evolving/moderate complexity  EVALUATION COMPLEXITY: Moderate   GOALS: Goals reviewed with patient? Yes  SHORT TERM GOALS: Target date: 09/07/2021  I with basic HEP Baseline: Goal status: met  2.  Increase R knee ROM to 15-110 degrees. Baseline: 23-91 Goal status: ongoing   LONG TERM GOALS: Target date: 11/02/2021   I with final HEP Baseline:  Goal status: INITIAL  2.  Improve R knee ROM to 5-120 Baseline: 23-91 Goal status: INITIAL  3.  Increased RLE  strength to at least 4-/5 throughout Baseline: 2+-3-/5 Goal status: INITIAL  4.  Increase FOTO to at least 72 Baseline: 46.7 Goal status: INITIAL  5.  Patient will ambulate I on level and unlevel surfaces x at least 1000'  Baseline: RW, 80', multiple deviations. Goal status: INITIAL  PLAN: PT FREQUENCY:  2-3x/week  PT DURATION: 12 weeks  PLANNED INTERVENTIONS: Therapeutic exercises, Therapeutic activity, Neuromuscular re-education, Balance training, Gait training, Patient/Family education, Self Care, Joint mobilization, Stair training, Electrical stimulation, Cryotherapy, Moist heat, Compression bandaging, Vasopneumatic device, Manual therapy, and Re-evaluation  PLAN FOR NEXT SESSION: Update HEP as appropriate, ROM, strengthening   Encounter Date: 08/29/2021   Marcelina Morel, DPT 08/29/2021, 10:09 AM  [address] [place], [place], [address] Phone: [phone]   Fax:  [phone]

## 2021-08-31 ENCOUNTER — Ambulatory Visit: Payer: BC Managed Care – PPO | Admitting: Physical Therapy

## 2021-08-31 DIAGNOSIS — M6281 Muscle weakness (generalized): Secondary | ICD-10-CM | POA: Diagnosis not present

## 2021-08-31 DIAGNOSIS — R262 Difficulty in walking, not elsewhere classified: Secondary | ICD-10-CM

## 2021-08-31 DIAGNOSIS — R600 Localized edema: Secondary | ICD-10-CM

## 2021-08-31 NOTE — Therapy (Signed)
OUTPATIENT PHYSICAL THERAPY LOWER EXTREMITY  Patient Name: Alex Morrison MRN: [id number] DOB:[birth year], 51 y.o., male Today's Date: 08/31/2021   PT End of Session - 08/31/21 0924     Visit Number 7    Date for PT Re-Evaluation 11/02/21    PT Start Time 0930    PT Stop Time 22    PT Time Calculation (min) 50 min              Past Medical History:  Diagnosis Date   Abscess of buttock    Acute kidney injury ([place]) 09/27/2013   Depression    Diabetes mellitus without complication ([place])    Diabetic neuropathy ([place])    Dialysis patient ([place])    M,W,F   ESRD (end stage renal disease) ([place])    Glaucoma    HTN (hypertension) 08/08/2018   Obesity    Perforated appendix    Peritonitis   Psoriasis    Retinopathy    Past Surgical History:  Procedure Laterality Date   ABSCESS      RECTAL    APPENDECTOMY     AV FISTULA PLACEMENT Left 01/13/2019   Procedure: LEFT ARM ARTERIOVENOUS (AV) FISTULA CREATION;  Surgeon: Elam Dutch, MD;  Location: [place];  Service: Vascular;  Laterality: Left;   CATARACT EXTRACTION     Ex lap with ileocecectomy  09/27/2013   Dr. Brantley Stage   HARDWARE REMOVAL Right 06/13/2021   Procedure: HARDWARE REMOVAL;  Surgeon: Marchia Bond, MD;  Location: [facility];  Service: Orthopedics;  Laterality: Right;   ILEOCECETOMY     INCISION AND DRAINAGE     INCISIONAL HERNIA REPAIR N/A 08/11/2018   Procedure: OPEN RETRORECTUS REPAIR OF INCARCERATED INCISIONAL HERNIA WITH BILATERAL POSTERIOR COMPONENT SEPARATION;  Surgeon: [name], MD;  Location: [facility];  Service: General;  Laterality: N/A;   INSERTION OF MESH  08/11/2018   Procedure: INSERTION OF PHASIX MESH;  Surgeon: [name], MD;  Location: [facility];  Service: General;;   insertion of PD catheter  12/2017   IR FLUORO GUIDE CV LINE RIGHT  08/08/2018   IR US GUIDE VASC ACCESS RIGHT  08/08/2018   LAPAROSCOPIC ABDOMINAL EXPLORATION N/A 09/27/2013   LAPAROSCOPIC ASSISTED VENTRAL HERNIA REPAIR  2019    LAPAROSCOPY N/A 09/27/2013   Procedure: LAPAROSCOPY DIAGNOSTIC;  Surgeon: Erroll Luna, MD;  Location: [facility];  Service: General;  Laterality: N/A;   LAPAROTOMY  08/11/2018   Procedure: EXPLORATORY LAPAROTOMY;  Surgeon: [name], MD;  Location: [facility];  Service: General;;   LYSIS OF ADHESION  08/11/2018   Procedure: LYSIS OF ADHESIONS X 30 MINUTES;  Surgeon: [name], MD;  Location: [facility];  Service: General;;   MINOR REMOVAL OF PERITONEAL DIALYSIS CATHETER N/A 01/06/2019   Procedure: REMOVAL OF PERITONEAL DIALYSIS CATHETER;  Surgeon: Kieth Brightly Arta Bruce, MD;  Location[facility];  Service: General;  Laterality: N/A;   OPEN REDUCTION INTERNAL FIXATION (ORIF) TIBIA/FIBULA FRACTURE Right 03/07/2019   Procedure: OPEN REDUCTION INTERNAL FIXATION (ORIF) FIBULA FRACTURE;  Surgeon: Marchia Bond, MD;  Location: [facility];  Service: Orthopedics;  Laterality: Right;   ORIF TIBIA PLATEAU Right 03/07/2019   Procedure: Open Reduction Internal Fixation (Orif) Tibial Plateau;  Surgeon: Marchia Bond, MD;  Location: [facility];  Service: Orthopedics;  Laterality: Right;   SHOULDER SURGERY Left 1989   TOTAL KNEE ARTHROPLASTY Right 08/08/2021   Procedure: TOTAL KNEE ARTHROPLASTY;  Surgeon: Marchia Bond, MD;  Location: [facility];  Service: Orthopedics;  Laterality: Right;   Patient Active Problem  List   Diagnosis Date Noted   Arthrosis of knee 08/08/2021   S/P total knee replacement, right 08/08/2021   Tibial plateau fracture 03/06/2019   ESRD (end stage renal disease) ([place]) 08/08/2018   HTN (hypertension) 08/08/2018   SBO (small bowel obstruction) ([place]) 37/16/9678   Periumbilical hernia 93/81/0175   Abdominal hernia as complication of peritoneal dialysis 08/08/2018   Psoriasis 09/29/2013   Acute gangrenous appendicitis with perforation and peritonitis 09/27/2013   Type 1 diabetes mellitus ([employer]) 09/27/2013   Acute kidney injury ([place]) 09/27/2013   DM (diabetes mellitus) type 2, uncontrolled, with ketoacidosis  ([place]) 07/17/2012   DKA (diabetic ketoacidosis) ([place]) 07/16/2012   Perirectal abscess 07/14/2012    PCP: Haywood Pao  REFERRING PROVIDER: Marchia Bond, MD   REFERRING DIAG: Diagnosis [phone] (ICD-10-CM) - Status post right knee replacement   THERAPY DIAG:  Muscle weakness (generalized)  Difficulty in walking, not elsewhere classified  Localized edema  Rationale for Evaluation and Treatment Rehabilitation  ONSET DATE: 08/08/21  SUBJECTIVE:   SUBJECTIVE STATEMENT: Doing better and swelling seems to be going down   PERTINENT HISTORY: Patellar fracture 2 years ago. DM, dialysis M, W, F  PAIN:  Are you having pain? Yes 0/10 mostly stiffness/tightness. Pt is very swollen   FALLS:  Has patient fallen in last 6 months? No  LIVING ENVIRONMENT: Lives with: lives with their spouse Lives in: House/apartment Stairs: Yes: External: 12 steps; on right going up Has following equipment at home: Walker - 2 wheeled, cane, has BSC, but not using.  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  PLOF: Independent  PATIENT GOALS Get back to as normal as possible.   OBJECTIVE:   EDEMA: R LE heavily bandaged, swelling present.  POSTURE: No Significant postural limitations  PALPATION: Deferred R knee heavily bandaged  LOWER EXTREMITY ROM: Other ROM grossly WFL  Passive ROM Right eval Left eval RT AROM Sitting 08/31/21  Hip flexion     Hip extension     Hip abduction     Hip adduction     Hip internal rotation     Hip external rotation     Knee flexion 91  105  Knee extension 23  10  Ankle dorsiflexion     Ankle plantarflexion     Ankle inversion     Ankle eversion      (Blank rows = not tested)  LOWER EXTREMITY MMT:  MMT Right eval Left eval  Hip flexion 3+ 4  Hip extension 3+ 4  Hip abduction 3 4  Hip adduction    Hip internal rotation    Hip external rotation    Knee flexion 3- 4  Knee extension 2+ 4  Ankle dorsiflexion 3+ 4  Ankle  plantarflexion    Ankle inversion    Ankle eversion     (Blank rows = not tested)  GAIT: Distance walked: 36' Assistive device utilized: Environmental consultant - 2 wheeled Level of assistance: Modified independence Comments: Step to gait pattern, walker too low, raised 1 notch, may be able to raise one more. Antalgic on R with decreased WB and stance time.    TODAY'S TREATMENT:  08/31/21  Nustep L 5 6 min LE only HS curls 20# 2 sets 10- cued to use legs = Knee ext 5# 2 sets 10- cued to use RT equally Leg PRess 60 # 2 sets 10, Rt LE 20# 3 sets 5- ROM had to be greater than 90 or he could not press 6 inch  step up fwd and laterally 15 x each with ue support- focus on TKE Green tband TKE standing 2 sets 10 Black bar calf raise 20 x STS on airex 10 x LAQ 3 # 2 sets 10 with TKE hold 3 sec Standing HS curl 3# 2 sets 10 VASO RT knee with elevation   08/29/21 NuStep L5 x 6 min  R knee PROM  S2S x10 4in step ups RLE 3x5 Hamstring curls RLE 15lb 2x10  Leg Ext RLE 5lb 2x10 Stairs 4 & 6 inch steps Alt pattern one rail R Leg press 40lb x10, 60lb x10, RLE 20lb x5 `  Vaso to R knee x 10 minutes, med pressure, 34 deg  08/24/21 NuStep L3-5 x 6 minutes Supine quad sets with heel elevated, 5 sec holds, mod overpressure, x 10 SAQ 10 reps, 5 sec holds SLR x 10 reps Seated long kick, knee flexion x 10 PROM 12-104 Knee flexion 25#, 2 x 10 reps Knee ext 2 x 10 10# Gait trainign without AD, VC for upright posture and R knee ext, with improved pattern noted. Vaso to R knee x 10 minutes, low pressure, 34 deg  08/22/21 NuStep L4  x6 min LE only  S2S 2x10 R knee PROM  LAQ 3lb 2x10 HS curls green 2x10 Hamstring curls 25lb 2x10  Leg extensions 10lb 2x10     Vaso R knee low pressure, 34 deg x10 min   08/17/21 NuStep L5 x 6 min    PROM flex and ext, gentle patellar mobs   Quad Sets RLE x10   LAQ 3lb RLE 2x10   RLE hamstring curls green 2x10   S2S 2x10   Leg Press 40lb 2x10, RLE 20lb 2x5  Vaso R knee  low pressure, 34 deg x10 min  08/15/21 Nustep L 5 5 min- good ROM Seated LAQ 2 set 10, HS curl red tband 2 sets 10, TKE red tband 2 sets 10 PROM flex and ext, gentle patellar mobs- AROM after seated 17-92 ( Pt has significant guarding with passive flexion, tolerates ext fairly well) Supine SAQ into ball 2 sets 10 Feet on ball bridge and KTC 2 sets 10 each Standing RT LE HS curl and march 10 each 2 sets VASO 10 min   Eval AAROM,  Ankle pumps, quad sets, short kicks, SLR, heel slides, SLR, seated knee flexion and ext, 5 reps each   PATIENT EDUCATION:  Education details: HEP,  Person educated: Patient Education method: Consulting civil engineer, Demonstration, and Handouts Education comprehension: verbalized understanding and returned demonstration   HOME EXERCISE PROGRAM:  [id number]  ASSESSMENT:  CLINICAL IMPRESSION: pnt arrived feeling better, less pain and swelling. Progressed ex with focus on TKE and cuing to engage correct muscles. Progressing with ROM, strength and goals   OBJECTIVE IMPAIRMENTS Abnormal gait, decreased activity tolerance, decreased balance, decreased coordination, decreased endurance, difficulty walking, decreased ROM, decreased strength, increased edema, impaired flexibility, postural dysfunction, and pain.   ACTIVITY LIMITATIONS carrying, lifting, bending, sitting, standing, squatting, stairs, transfers, bed mobility, bathing, toileting, dressing, hygiene/grooming, and locomotion level  PARTICIPATION LIMITATIONS: meal prep, cleaning, laundry, shopping, community activity, occupation, and yard work  PERSONAL FACTORS Past/current experiences and 1 comorbidity: DM with dialysis,  are also affecting patient's functional outcome.   REHAB POTENTIAL: Good  CLINICAL DECISION MAKING: Evolving/moderate complexity  EVALUATION COMPLEXITY: Moderate   GOALS: Goals reviewed with patient? Yes  SHORT TERM GOALS: Target date: 09/07/2021  I with basic HEP Baseline: Goal status:  met  2.  Increase R knee ROM  to 15-110 degrees. Baseline: 10-105 Goal status: partially met   LONG TERM GOALS: Target date: 11/02/2021   I with final HEP Baseline:  Goal status: partiallt met  2.  Improve R knee ROM to 5-120 Baseline: 23-91 Goal status: INITIAL  3.  Increased RLE strength to at least 4-/5 throughout Baseline: 2+-3-/5 Goal status: progressing  4.  Increase FOTO to at least 72 Baseline: 46.7 Goal status: INITIAL  5.  Patient will ambulate I on level and unlevel surfaces x at least 1000' Baseline: RW, 80', multiple deviations. Goal status: INITIAL  PLAN: PT FREQUENCY:  2-3x/week  PT DURATION: 12 weeks  PLANNED INTERVENTIONS: Therapeutic exercises, Therapeutic activity, Neuromuscular re-education, Balance training, Gait training, Patient/Family education, Self Care, Joint mobilization, Stair training, Electrical stimulation, Cryotherapy, Moist heat, Compression bandaging, Vasopneumatic device, Manual therapy, and Re-evaluation  PLAN FOR NEXT SESSION: Update HEP as appropriate, ROM, strengthening   Encounter Date: 08/31/2021   Angie Shonice Wrisley PTA 08/31/2021, 9:26 AM  [place]. [place], [place], [address] Phone: [phone]   Fax:  [place]. [place], [place], [address] Phone: [phone]   Fax:  [phone]

## 2021-09-05 ENCOUNTER — Encounter: Payer: Self-pay | Admitting: Physical Therapy

## 2021-09-05 ENCOUNTER — Ambulatory Visit: Payer: BC Managed Care – PPO | Admitting: Physical Therapy

## 2021-09-05 DIAGNOSIS — M6281 Muscle weakness (generalized): Secondary | ICD-10-CM

## 2021-09-05 DIAGNOSIS — R262 Difficulty in walking, not elsewhere classified: Secondary | ICD-10-CM

## 2021-09-05 DIAGNOSIS — R2681 Unsteadiness on feet: Secondary | ICD-10-CM

## 2021-09-05 DIAGNOSIS — R600 Localized edema: Secondary | ICD-10-CM

## 2021-09-05 NOTE — Therapy (Signed)
OUTPATIENT PHYSICAL THERAPY LOWER EXTREMITY  Patient Name: Alex Morrison MRN: [id number] DOB:Aug 11, 1970, 51 y.o., male Today's Date: 09/05/2021   PT End of Session - 09/05/21 0925     Visit Number 8    Date for PT Re-Evaluation 11/02/21    PT Start Time 0928    PT Stop Time 1021    PT Time Calculation (min) 53 min    Activity Tolerance Patient tolerated treatment well    Behavior During Therapy [facility] for tasks assessed/performed              Past Medical History:  Diagnosis Date   Abscess of buttock    Acute kidney injury ([place]) 09/27/2013   Depression    Diabetes mellitus without complication ([place])    Diabetic neuropathy ([place])    Dialysis patient ([place])    M,W,F   ESRD (end stage renal disease) ([place])    Glaucoma    HTN (hypertension) 08/08/2018   Obesity    Perforated appendix    Peritonitis   Psoriasis    Retinopathy    Past Surgical History:  Procedure Laterality Date   ABSCESS      RECTAL    APPENDECTOMY     AV FISTULA PLACEMENT Left 01/13/2019   Procedure: LEFT ARM ARTERIOVENOUS (AV) FISTULA CREATION;  Surgeon: Elam Dutch, MD;  Location: [facility];  Service: Vascular;  Laterality: Left;   CATARACT EXTRACTION     Ex lap with ileocecectomy  09/27/2013   Dr. Brantley Stage   HARDWARE REMOVAL Right 06/13/2021   Procedure: HARDWARE REMOVAL;  Surgeon: Marchia Bond, MD;  Location: [facility];  Service: Orthopedics;  Laterality: Right;   ILEOCECETOMY     INCISION AND DRAINAGE     INCISIONAL HERNIA REPAIR N/A 08/11/2018   Procedure: OPEN RETRORECTUS REPAIR OF INCARCERATED INCISIONAL HERNIA WITH BILATERAL POSTERIOR COMPONENT SEPARATION;  Surgeon: [name], MD;  Location: [facility];  Service: General;  Laterality: N/A;   INSERTION OF MESH  08/11/2018   Procedure: INSERTION OF PHASIX MESH;  Surgeon: [name], MD;  Location: [facility];  Service: General;;   insertion of PD catheter  12/2017   IR FLUORO GUIDE CV LINE RIGHT  08/08/2018   IR US GUIDE VASC ACCESS RIGHT   08/08/2018   LAPAROSCOPIC ABDOMINAL EXPLORATION N/A 09/27/2013   LAPAROSCOPIC ASSISTED VENTRAL HERNIA REPAIR  2019   LAPAROSCOPY N/A 09/27/2013   Procedure: LAPAROSCOPY DIAGNOSTIC;  Surgeon: Erroll Luna, MD;  Location: [facility];  Service: General;  Laterality: N/A;   LAPAROTOMY  08/11/2018   Procedure: EXPLORATORY LAPAROTOMY;  Surgeon: [name], MD;  Location: [facility];  Service: General;;   LYSIS OF ADHESION  08/11/2018   Procedure: LYSIS OF ADHESIONS X 30 MINUTES;  Surgeon: [name], MD;  Location: [facility];  Service: General;;   MINOR REMOVAL OF PERITONEAL DIALYSIS CATHETER N/A 01/06/2019   Procedure: REMOVAL OF PERITONEAL DIALYSIS CATHETER;  Surgeon: Kieth Brightly Arta Bruce, MD;  [facility];  Service: General;  Laterality: N/A;   OPEN REDUCTION INTERNAL FIXATION (ORIF) TIBIA/FIBULA FRACTURE Right 03/07/2019   Procedure: OPEN REDUCTION INTERNAL FIXATION (ORIF) FIBULA FRACTURE;  Surgeon: Marchia Bond, MD;  Location: [facility];  Service: Orthopedics;  Laterality: Right;   ORIF TIBIA PLATEAU Right 03/07/2019   Procedure: Open Reduction Internal Fixation (Orif) Tibial Plateau;  Surgeon: Marchia Bond, MD;  Location: [facility];  Service: Orthopedics;  Laterality: Right;   SHOULDER SURGERY Left 1989   TOTAL KNEE ARTHROPLASTY Right 08/08/2021   Procedure: TOTAL KNEE ARTHROPLASTY;  Surgeon: Marchia Bond, MD;  Location: [facility];  Service: Orthopedics;  Laterality: Right;   Patient Active Problem List   Diagnosis Date Noted   Arthrosis of knee 08/08/2021   S/P total knee replacement, right 08/08/2021   Tibial plateau fracture 03/06/2019   ESRD (end stage renal disease) ([place]) 08/08/2018   HTN (hypertension) 08/08/2018   SBO (small bowel obstruction) ([place]) 75/17/0017   Periumbilical hernia 49/44/9675   Abdominal hernia as complication of peritoneal dialysis 08/08/2018   Psoriasis 09/29/2013   Acute gangrenous appendicitis with perforation and peritonitis 09/27/2013   Type 1 diabetes mellitus  ([place]) 09/27/2013   Acute kidney injury ([place]) 09/27/2013   DM (diabetes mellitus) type 2, uncontrolled, with ketoacidosis ([place]) 07/17/2012   DKA (diabetic ketoacidosis) ([place]) 07/16/2012   Perirectal abscess 07/14/2012    PCP: Haywood Pao  REFERRING PROVIDER: Marchia Bond, MD   REFERRING DIAG: Diagnosis [phone] (ICD-10-CM) - Status post right knee replacement   THERAPY DIAG:  Muscle weakness (generalized)  Difficulty in walking, not elsewhere classified  Localized edema  Unsteadiness on feet  Rationale for Evaluation and Treatment Rehabilitation  ONSET DATE: 08/08/21  SUBJECTIVE:   SUBJECTIVE STATEMENT: Not feeling great, dealing with stomach stuff again, did not sleep too well.    PERTINENT HISTORY: Patellar fracture 2 years ago. DM, dialysis M, W, F  PAIN:  Are you having pain? Yes 0/10 mostly stiffness/tightness. Pt is very swollen   FALLS:  Has patient fallen in last 6 months? No  LIVING ENVIRONMENT: Lives with: lives with their spouse Lives in: House/apartment Stairs: Yes: External: 12 steps; on right going up Has following equipment at home: Walker - 2 wheeled, cane, has BSC, but not using.  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  PLOF: Independent  PATIENT GOALS Get back to as normal as possible.   OBJECTIVE:   EDEMA: R LE heavily bandaged, swelling present.  POSTURE: No Significant postural limitations  PALPATION: Deferred R knee heavily bandaged  LOWER EXTREMITY ROM: Other ROM grossly WFL  Passive ROM Right eval Left eval RT AROM Sitting 08/31/21  Hip flexion     Hip extension     Hip abduction     Hip adduction     Hip internal rotation     Hip external rotation     Knee flexion 91  105  Knee extension 23  10  Ankle dorsiflexion     Ankle plantarflexion     Ankle inversion     Ankle eversion      (Blank rows = not tested)  LOWER EXTREMITY MMT:  MMT Right eval Left eval  Hip flexion 3+ 4  Hip extension  3+ 4  Hip abduction 3 4  Hip adduction    Hip internal rotation    Hip external rotation    Knee flexion 3- 4  Knee extension 2+ 4  Ankle dorsiflexion 3+ 4  Ankle plantarflexion    Ankle inversion    Ankle eversion     (Blank rows = not tested)  GAIT: Distance walked: 82' Assistive device utilized: Environmental consultant - 2 wheeled Level of assistance: Modified independence Comments: Step to gait pattern, walker too low, raised 1 notch, may be able to raise one more. Antalgic on R with decreased WB and stance time.    TODAY'S TREATMENT:  09/05/21 NuStep L5 x 6 min LE only 6 in step ups x 10 each some UE assist Heel raises on black bar 2x10 Resisted gait 30lb 4 way  x 3 each  Hamstring curls RLE 15lb 2x10  Leg Ext 5lb RLE 2x10 Sit to stands on aires 2x10 Leg press 40lb 2x10, RLE 20lb 3 x5 Vaso R knee Med, 34 deg x 10 mn    08/31/21  Nustep L 5 6 min LE only HS curls 20# 2 sets 10- cued to use legs = Knee ext 5# 2 sets 10- cued to use RT equally Leg PRess 60 # 2 sets 10, Rt LE 20# 3 sets 5- ROM had to be greater than 90 or he could not press 6 inch step up fwd and laterally 15 x each with ue support- focus on TKE Green tband TKE standing 2 sets 10 Black bar calf raise 20 x STS on airex 10 x LAQ 3 # 2 sets 10 with TKE hold 3 sec Standing HS curl 3# 2 sets 10 VASO RT knee with elevation   08/29/21 NuStep L5 x 6 min  R knee PROM  S2S x10 4in step ups RLE 3x5 Hamstring curls RLE 15lb 2x10  Leg Ext RLE 5lb 2x10 Stairs 4 & 6 inch steps Alt pattern one rail R Leg press 40lb x10, 60lb x10, RLE 20lb x5 `  Vaso to R knee x 10 minutes, med pressure, 34 deg  08/24/21 NuStep L3-5 x 6 minutes Supine quad sets with heel elevated, 5 sec holds, mod overpressure, x 10 SAQ 10 reps, 5 sec holds SLR x 10 reps Seated long kick, knee flexion x 10 PROM 12-104 Knee flexion 25#, 2 x 10 reps Knee ext 2 x 10 10# Gait trainign without AD, VC for upright posture and R knee ext, with improved  pattern noted. Vaso to R knee x 10 minutes, low pressure, 34 deg  08/22/21 NuStep L4  x6 min LE only  S2S 2x10 R knee PROM  LAQ 3lb 2x10 HS curls green 2x10 Hamstring curls 25lb 2x10  Leg extensions 10lb 2x10     Vaso R knee low pressure, 34 deg x10 min   08/17/21 NuStep L5 x 6 min    PROM flex and ext, gentle patellar mobs   Quad Sets RLE x10   LAQ 3lb RLE 2x10   RLE hamstring curls green 2x10   S2S 2x10   Leg Press 40lb 2x10, RLE 20lb 2x5  Vaso R knee low pressure, 34 deg x10 min  08/15/21 Nustep L 5 5 min- good ROM Seated LAQ 2 set 10, HS curl red tband 2 sets 10, TKE red tband 2 sets 10 PROM flex and ext, gentle patellar mobs- AROM after seated 17-92 ( Pt has significant guarding with passive flexion, tolerates ext fairly well) Supine SAQ into ball 2 sets 10 Feet on ball bridge and KTC 2 sets 10 each Standing RT LE HS curl and march 10 each 2 sets VASO 10 min   Eval AAROM,  Ankle pumps, quad sets, short kicks, SLR, heel slides, SLR, seated knee flexion and ext, 5 reps each   PATIENT EDUCATION:  Education details: HEP,  Person educated: Patient Education method: Consulting civil engineer, Demonstration, and Handouts Education comprehension: verbalized understanding and returned demonstration   HOME EXERCISE PROGRAM:  [id number]  ASSESSMENT:  CLINICAL IMPRESSION: Pt arrived with reports of not feeling well so frequent rest give throughout session. Cues to focus on TKE with sit to stand and step ups. Some difficulty with SL on leg press. Cues to allow full flexion with single leg extensions.    OBJECTIVE IMPAIRMENTS Abnormal gait, decreased activity tolerance, decreased balance, decreased coordination,  decreased endurance, difficulty walking, decreased ROM, decreased strength, increased edema, impaired flexibility, postural dysfunction, and pain.   ACTIVITY LIMITATIONS carrying, lifting, bending, sitting, standing, squatting, stairs, transfers, bed mobility, bathing, toileting,  dressing, hygiene/grooming, and locomotion level  PARTICIPATION LIMITATIONS: meal prep, cleaning, laundry, shopping, community activity, occupation, and yard work  PERSONAL FACTORS Past/current experiences and 1 comorbidity: DM with dialysis,  are also affecting patient's functional outcome.   REHAB POTENTIAL: Good  CLINICAL DECISION MAKING: Evolving/moderate complexity  EVALUATION COMPLEXITY: Moderate   GOALS: Goals reviewed with patient? Yes  SHORT TERM GOALS: Target date: 09/07/2021  I with basic HEP Baseline: Goal status: met  2.  Increase R knee ROM to 15-110 degrees. Baseline: 10-105 Goal status: partially met   LONG TERM GOALS: Target date: 11/02/2021   I with final HEP Baseline:  Goal status: partiallt met  2.  Improve R knee ROM to 5-120 Baseline: 23-91 Goal status: INITIAL  3.  Increased RLE strength to at least 4-/5 throughout Baseline: 2+-3-/5 Goal status: progressing  4.  Increase FOTO to at least 72 Baseline: 46.7 Goal status: INITIAL  5.  Patient will ambulate I on level and unlevel surfaces x at least 1000' Baseline: RW, 80', multiple deviations. Goal status: INITIAL  PLAN: PT FREQUENCY:  2-3x/week  PT DURATION: 12 weeks  PLANNED INTERVENTIONS: Therapeutic exercises, Therapeutic activity, Neuromuscular re-education, Balance training, Gait training, Patient/Family education, Self Care, Joint mobilization, Stair training, Electrical stimulation, Cryotherapy, Moist heat, Compression bandaging, Vasopneumatic device, Manual therapy, and Re-evaluation  PLAN FOR NEXT SESSION: Update HEP as appropriate, ROM, strengthening   Encounter Date: 09/05/2021   Angie Payseur PTA 09/05/2021, 10:13 AM  [place]. [place], [place], [address] Phone: [phone]   Fax:  [place]. [place], [place], [address] Phone:  [phone]   Fax:  [phone]

## 2021-09-07 ENCOUNTER — Encounter: Payer: Self-pay | Admitting: Physical Therapy

## 2021-09-07 ENCOUNTER — Ambulatory Visit: Payer: BC Managed Care – PPO | Admitting: Physical Therapy

## 2021-09-07 DIAGNOSIS — R600 Localized edema: Secondary | ICD-10-CM

## 2021-09-07 DIAGNOSIS — R2681 Unsteadiness on feet: Secondary | ICD-10-CM

## 2021-09-07 DIAGNOSIS — M6281 Muscle weakness (generalized): Secondary | ICD-10-CM

## 2021-09-07 DIAGNOSIS — R262 Difficulty in walking, not elsewhere classified: Secondary | ICD-10-CM

## 2021-09-07 NOTE — Therapy (Signed)
OUTPATIENT PHYSICAL THERAPY LOWER EXTREMITY  Patient Name: Alex Morrison MRN: [id number] DOB:Dec 12, 1970, 51 y.o., male Today's Date: 09/07/2021   PT End of Session - 09/07/21 0932     Visit Number 9    Date for PT Re-Evaluation 11/02/21    PT Start Time 0931    PT Stop Time 1021    PT Time Calculation (min) 50 min    Activity Tolerance Patient tolerated treatment well    Behavior During Therapy [facility] for tasks assessed/performed              Past Medical History:  Diagnosis Date   Abscess of buttock    Acute kidney injury ([place]) 09/27/2013   Depression    Diabetes mellitus without complication ([place])    Diabetic neuropathy ([place])    Dialysis patient ([place])    M,W,F   ESRD (end stage renal disease) ([place])    Glaucoma    HTN (hypertension) 08/08/2018   Obesity    Perforated appendix    Peritonitis   Psoriasis    Retinopathy    Past Surgical History:  Procedure Laterality Date   ABSCESS      RECTAL    APPENDECTOMY     AV FISTULA PLACEMENT Left 01/13/2019   Procedure: LEFT ARM ARTERIOVENOUS (AV) FISTULA CREATION;  Surgeon: Elam Dutch, MD;  Location: [place];  Service: Vascular;  Laterality: Left;   CATARACT EXTRACTION     Ex lap with ileocecectomy  09/27/2013   Dr. Brantley Stage   HARDWARE REMOVAL Right 06/13/2021   Procedure: HARDWARE REMOVAL;  Surgeon: Marchia Bond, MD;  Location: [place];  Service: Orthopedics;  Laterality: Right;   ILEOCECETOMY     INCISION AND DRAINAGE     INCISIONAL HERNIA REPAIR N/A 08/11/2018   Procedure: OPEN RETRORECTUS REPAIR OF INCARCERATED INCISIONAL HERNIA WITH BILATERAL POSTERIOR COMPONENT SEPARATION;  Surgeon: [name], MD;  Location: [place];  Service: General;  Laterality: N/A;   INSERTION OF MESH  08/11/2018   Procedure: INSERTION OF PHASIX MESH;  Surgeon: [name], MD;  Location: [place];  Service: General;;   insertion of PD catheter  12/2017   IR FLUORO GUIDE CV LINE RIGHT  08/08/2018   IR US GUIDE VASC ACCESS RIGHT   08/08/2018   LAPAROSCOPIC ABDOMINAL EXPLORATION N/A 09/27/2013   LAPAROSCOPIC ASSISTED VENTRAL HERNIA REPAIR  2019   LAPAROSCOPY N/A 09/27/2013   Procedure: LAPAROSCOPY DIAGNOSTIC;  Surgeon: Erroll Luna, MD;  Location: [place];  Service: General;  Laterality: N/A;   LAPAROTOMY  08/11/2018   Procedure: EXPLORATORY LAPAROTOMY;  Surgeon: [name], MD;  Location: [place];  Service: General;;   LYSIS OF ADHESION  08/11/2018   Procedure: LYSIS OF ADHESIONS X 30 MINUTES;  Surgeon: [name], MD;  Location: [place];  Service: General;;   MINOR REMOVAL OF PERITONEAL DIALYSIS CATHETER N/A 01/06/2019   Procedure: REMOVAL OF PERITONEAL DIALYSIS CATHETER;  Surgeon: Kieth Brightly Arta Bruce, MD;  [facility];  Service: General;  Laterality: N/A;   OPEN REDUCTION INTERNAL FIXATION (ORIF) TIBIA/FIBULA FRACTURE Right 03/07/2019   Procedure: OPEN REDUCTION INTERNAL FIXATION (ORIF) FIBULA FRACTURE;  Surgeon: Marchia Bond, MD;  Location: [place];  Service: Orthopedics;  Laterality: Right;   ORIF TIBIA PLATEAU Right 03/07/2019   Procedure: Open Reduction Internal Fixation (Orif) Tibial Plateau;  Surgeon: Marchia Bond, MD;  Location: [place];  Service: Orthopedics;  Laterality: Right;   SHOULDER SURGERY Left 1989   TOTAL KNEE ARTHROPLASTY Right 08/08/2021   Procedure: TOTAL KNEE ARTHROPLASTY;  Surgeon: Marchia Bond, MD;  Location: [place];  Service: Orthopedics;  Laterality: Right;   Patient Active Problem List   Diagnosis Date Noted   Arthrosis of knee 08/08/2021   S/P total knee replacement, right 08/08/2021   Tibial plateau fracture 03/06/2019   ESRD (end stage renal disease) ([place]) 08/08/2018   HTN (hypertension) 08/08/2018   SBO (small bowel obstruction) ([place]) 24/09/7351   Periumbilical hernia 29/92/4268   Abdominal hernia as complication of peritoneal dialysis 08/08/2018   Psoriasis 09/29/2013   Acute gangrenous appendicitis with perforation and peritonitis 09/27/2013   Type 1 diabetes mellitus  ([place]) 09/27/2013   Acute kidney injury ([place]) 09/27/2013   DM (diabetes mellitus) type 2, uncontrolled, with ketoacidosis ([place]) 07/17/2012   DKA (diabetic ketoacidosis) ([place]) 07/16/2012   Perirectal abscess 07/14/2012    PCP: Haywood Pao  REFERRING PROVIDER: Marchia Bond, MD   REFERRING DIAG: Diagnosis [phone] (ICD-10-CM) - Status post right knee replacement   THERAPY DIAG:  Difficulty in walking, not elsewhere classified  Localized edema  Unsteadiness on feet  Muscle weakness (generalized)  Rationale for Evaluation and Treatment Rehabilitation  ONSET DATE: 08/08/21  SUBJECTIVE:   SUBJECTIVE STATEMENT: "Good"   PERTINENT HISTORY: Patellar fracture 2 years ago. DM, dialysis M, W, F  PAIN:  Are you having pain? Yes 0/10 mostly stiffness/tightness. Pt is very swollen   FALLS:  Has patient fallen in last 6 months? No  LIVING ENVIRONMENT: Lives with: lives with their spouse Lives in: House/apartment Stairs: Yes: External: 12 steps; on right going up Has following equipment at home: Walker - 2 wheeled, cane, has BSC, but not using.  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  PLOF: Independent  PATIENT GOALS Get back to as normal as possible.   OBJECTIVE:   EDEMA: R LE heavily bandaged, swelling present.  POSTURE: No Significant postural limitations  PALPATION: Deferred R knee heavily bandaged  LOWER EXTREMITY ROM: Other ROM grossly WFL  Passive ROM Right eval Left eval RT AROM Sitting 08/31/21  Hip flexion     Hip extension     Hip abduction     Hip adduction     Hip internal rotation     Hip external rotation     Knee flexion 91  105  Knee extension 23  10  Ankle dorsiflexion     Ankle plantarflexion     Ankle inversion     Ankle eversion      (Blank rows = not tested)  LOWER EXTREMITY MMT:  MMT Right eval Left eval  Hip flexion 3+ 4  Hip extension 3+ 4  Hip abduction 3 4  Hip adduction    Hip internal rotation     Hip external rotation    Knee flexion 3- 4  Knee extension 2+ 4  Ankle dorsiflexion 3+ 4  Ankle plantarflexion    Ankle inversion    Ankle eversion     (Blank rows = not tested)  GAIT: Distance walked: 29' Assistive device utilized: Environmental consultant - 2 wheeled Level of assistance: Modified independence Comments: Step to gait pattern, walker too low, raised 1 notch, may be able to raise one more. Antalgic on R with decreased WB and stance time.    TODAY'S TREATMENT: 09/07/21 NuStep L4 x 6 min LE only  Recumbent bike L1 x3 min  Heel raises black bar 2x10  Resisted gait 40lb 4 way  x 3 each  6in step ups RLE x2 Hamstring curls RLE 15lb 2x10  Leg Ext  5lb RLE 2x10 Sit to stands from airex 2x10 Vaso R knee Med, 34 deg x 10 mn    09/05/21 NuStep L5 x 6 min LE only 6 in step ups x 10 each some UE assist Heel raises on black bar 2x10 Resisted gait 30lb 4 way  x 3 each  Hamstring curls RLE 15lb 2x10  Leg Ext 5lb RLE 2x10 Sit to stands on aires 2x10 Leg press 40lb 2x10, RLE 20lb 3 x5 Vaso R knee Med, 34 deg x 10 mn    08/31/21  Nustep L 5 6 min LE only HS curls 20# 2 sets 10- cued to use legs = Knee ext 5# 2 sets 10- cued to use RT equally Leg PRess 60 # 2 sets 10, Rt LE 20# 3 sets 5- ROM had to be greater than 90 or he could not press 6 inch step up fwd and laterally 15 x each with ue support- focus on TKE Green tband TKE standing 2 sets 10 Black bar calf raise 20 x STS on airex 10 x LAQ 3 # 2 sets 10 with TKE hold 3 sec Standing HS curl 3# 2 sets 10 VASO RT knee with elevation   08/29/21 NuStep L5 x 6 min  R knee PROM  S2S x10 4in step ups RLE 3x5 Hamstring curls RLE 15lb 2x10  Leg Ext RLE 5lb 2x10 Stairs 4 & 6 inch steps Alt pattern one rail R Leg press 40lb x10, 60lb x10, RLE 20lb x5 `  Vaso to R knee x 10 minutes, med pressure, 34 deg     PATIENT EDUCATION:  Education details: HEP,  Person educated: Patient Education method: Explanation, Demonstration, and  Handouts Education comprehension: verbalized understanding and returned demonstration   HOME EXERCISE PROGRAM:  [id number]  ASSESSMENT:  CLINICAL IMPRESSION: Pt arrived feeling well. Increase resistance tolerated with resisted gait, cue needed to flex R knee at ties. Pt able to complete full revolutiong on recumbent bike. Cues for knee alignment needed with sit to stands. R knee is lacking full TKE with extensions. Modalities or pain and swelling.  OBJECTIVE IMPAIRMENTS Abnormal gait, decreased activity tolerance, decreased balance, decreased coordination, decreased endurance, difficulty walking, decreased ROM, decreased strength, increased edema, impaired flexibility, postural dysfunction, and pain.   ACTIVITY LIMITATIONS carrying, lifting, bending, sitting, standing, squatting, stairs, transfers, bed mobility, bathing, toileting, dressing, hygiene/grooming, and locomotion level  PARTICIPATION LIMITATIONS: meal prep, cleaning, laundry, shopping, community activity, occupation, and yard work  PERSONAL FACTORS Past/current experiences and 1 comorbidity: DM with dialysis,  are also affecting patient's functional outcome.   REHAB POTENTIAL: Good  CLINICAL DECISION MAKING: Evolving/moderate complexity  EVALUATION COMPLEXITY: Moderate   GOALS: Goals reviewed with patient? Yes  SHORT TERM GOALS: Target date: 09/07/2021  I with basic HEP Baseline: Goal status: met  2.  Increase R knee ROM to 15-110 degrees. Baseline: 10-105 Goal status: partially met   LONG TERM GOALS: Target date: 11/02/2021   I with final HEP Baseline:  Goal status: partiallt met  2.  Improve R knee ROM to 5-120 Baseline: 23-91 Goal status: INITIAL  3.  Increased RLE strength to at least 4-/5 throughout Baseline: 2+-3-/5 Goal status: progressing  4.  Increase FOTO to at least 72 Baseline: 46.7 Goal status: INITIAL  5.  Patient will ambulate I on level and unlevel surfaces x at least 1000' Baseline:  RW, 80', multiple deviations. Goal status: INITIAL  PLAN: PT FREQUENCY:  2-3x/week  PT DURATION: 12 weeks  PLANNED INTERVENTIONS: Therapeutic exercises, Therapeutic  activity, Neuromuscular re-education, Balance training, Gait training, Patient/Family education, Self Care, Joint mobilization, Stair training, Electrical stimulation, Cryotherapy, Moist heat, Compression bandaging, Vasopneumatic device, Manual therapy, and Re-evaluation  PLAN FOR NEXT SESSION: Update HEP as appropriate, ROM, strengthening   Encounter Date: 09/07/2021   Angie Payseur PTA 09/07/2021, 9:33 AM  [place]. [place], [place], [address] Phone: [phone]   Fax:  [place]. [place], [place], [address] Phone: [phone]   Fax:  [phone]

## 2021-09-12 ENCOUNTER — Ambulatory Visit: Payer: BC Managed Care – PPO | Admitting: Physical Therapy

## 2021-09-12 ENCOUNTER — Encounter: Payer: Self-pay | Admitting: Physical Therapy

## 2021-09-12 ENCOUNTER — Encounter (HOSPITAL_COMMUNITY): Payer: Self-pay

## 2021-09-12 DIAGNOSIS — M6281 Muscle weakness (generalized): Secondary | ICD-10-CM

## 2021-09-12 DIAGNOSIS — R600 Localized edema: Secondary | ICD-10-CM

## 2021-09-12 DIAGNOSIS — R2681 Unsteadiness on feet: Secondary | ICD-10-CM

## 2021-09-12 DIAGNOSIS — R2689 Other abnormalities of gait and mobility: Secondary | ICD-10-CM

## 2021-09-12 DIAGNOSIS — M25561 Pain in right knee: Secondary | ICD-10-CM

## 2021-09-12 DIAGNOSIS — R278 Other lack of coordination: Secondary | ICD-10-CM

## 2021-09-12 DIAGNOSIS — R262 Difficulty in walking, not elsewhere classified: Secondary | ICD-10-CM

## 2021-09-12 NOTE — Therapy (Signed)
OUTPATIENT PHYSICAL THERAPY LOWER EXTREMITY  Patient Name: Alex Morrison MRN: [id number] DOB:02-13-1970, 51 y.o., male Today's Date: 09/12/2021   PT End of Session - 09/12/21 0937     Visit Number 10    Date for PT Re-Evaluation 11/02/21    PT Start Time 0929    PT Stop Time 1010    PT Time Calculation (min) 41 min    Activity Tolerance Patient tolerated treatment well    Behavior During Therapy [facility] for tasks assessed/performed            Progress Note Reporting Period 08/10/21 to 09/12/21  See note below for Objective Data and Assessment of Progress/Goals.       Past Medical History:  Diagnosis Date   Abscess of buttock    Acute kidney injury ([place]) 09/27/2013   Depression    Diabetes mellitus without complication ([place])    Diabetic neuropathy ([place])    Dialysis patient ([place])    M,W,F   ESRD (end stage renal disease) ([place])    Glaucoma    HTN (hypertension) 08/08/2018   Obesity    Perforated appendix    Peritonitis   Psoriasis    Retinopathy    Past Surgical History:  Procedure Laterality Date   ABSCESS      RECTAL    APPENDECTOMY     AV FISTULA PLACEMENT Left 01/13/2019   Procedure: LEFT ARM ARTERIOVENOUS (AV) FISTULA CREATION;  Surgeon: Elam Dutch, MD;  Location: [place];  Service: Vascular;  Laterality: Left;   CATARACT EXTRACTION     Ex lap with ileocecectomy  09/27/2013   Dr. Brantley Stage   HARDWARE REMOVAL Right 06/13/2021   Procedure: HARDWARE REMOVAL;  Surgeon: Marchia Bond, MD;  Location: [place];  Service: Orthopedics;  Laterality: Right;   ILEOCECETOMY     INCISION AND DRAINAGE     INCISIONAL HERNIA REPAIR N/A 08/11/2018   Procedure: OPEN RETRORECTUS REPAIR OF INCARCERATED INCISIONAL HERNIA WITH BILATERAL POSTERIOR COMPONENT SEPARATION;  Surgeon: [name], MD;  Location: [place];  Service: General;  Laterality: N/A;   INSERTION OF MESH  08/11/2018   Procedure: INSERTION OF PHASIX MESH;  Surgeon: [name], MD;  Location: [place];  Service:  General;;   insertion of PD catheter  12/2017   IR FLUORO GUIDE CV LINE RIGHT  08/08/2018   IR US GUIDE VASC ACCESS RIGHT  08/08/2018   LAPAROSCOPIC ABDOMINAL EXPLORATION N/A 09/27/2013   LAPAROSCOPIC ASSISTED VENTRAL HERNIA REPAIR  2019   LAPAROSCOPY N/A 09/27/2013   Procedure: LAPAROSCOPY DIAGNOSTIC;  Surgeon: Erroll Luna, MD;  Location: [place];  Service: General;  Laterality: N/A;   LAPAROTOMY  08/11/2018   Procedure: EXPLORATORY LAPAROTOMY;  Surgeon: [name], MD;  Location: [place];  Service: General;;   LYSIS OF ADHESION  08/11/2018   Procedure: LYSIS OF ADHESIONS X 30 MINUTES;  Surgeon: [name], MD;  Location: [place];  Service: General;;   MINOR REMOVAL OF PERITONEAL DIALYSIS CATHETER N/A 01/06/2019   Procedure: REMOVAL OF PERITONEAL DIALYSIS CATHETER;  Surgeon: Kieth Brightly Arta Bruce, MD;  [facility];  Service: General;  Laterality: N/A;   OPEN REDUCTION INTERNAL FIXATION (ORIF) TIBIA/FIBULA FRACTURE Right 03/07/2019   Procedure: OPEN REDUCTION INTERNAL FIXATION (ORIF) FIBULA FRACTURE;  Surgeon: Marchia Bond, MD;  Location: [place];  Service: Orthopedics;  Laterality: Right;   ORIF TIBIA PLATEAU Right 03/07/2019   Procedure: Open Reduction Internal Fixation (Orif) Tibial Plateau;  Surgeon: Marchia Bond, MD;  Location: [place];  Service: Orthopedics;  Laterality: Right;   SHOULDER SURGERY Left 1989   TOTAL KNEE ARTHROPLASTY Right 08/08/2021   Procedure: TOTAL KNEE ARTHROPLASTY;  Surgeon: Marchia Bond, MD;  Location: [place];  Service: Orthopedics;  Laterality: Right;   Patient Active Problem List   Diagnosis Date Noted   Arthrosis of knee 08/08/2021   S/P total knee replacement, right 08/08/2021   Tibial plateau fracture 03/06/2019   ESRD (end stage renal disease) ([place]) 08/08/2018   HTN (hypertension) 08/08/2018   SBO (small bowel obstruction) ([place]) 54/62/7035   Periumbilical hernia 00/93/8182   Abdominal hernia as complication of peritoneal dialysis 08/08/2018    Psoriasis 09/29/2013   Acute gangrenous appendicitis with perforation and peritonitis 09/27/2013   Type 1 diabetes mellitus ([place]) 09/27/2013   Acute kidney injury ([place]) 09/27/2013   DM (diabetes mellitus) type 2, uncontrolled, with ketoacidosis ([place]) 07/17/2012   DKA (diabetic ketoacidosis) ([place]) 07/16/2012   Perirectal abscess 07/14/2012    PCP: Haywood Pao  REFERRING PROVIDER: Marchia Bond, MD   REFERRING DIAG: Diagnosis [phone] (ICD-10-CM) - Status post right knee replacement   THERAPY DIAG:  Difficulty in walking, not elsewhere classified  Other abnormalities of gait and mobility  Localized edema  Other lack of coordination  Unsteadiness on feet  Acute pain of right knee  Muscle weakness (generalized)  Rationale for Evaluation and Treatment Rehabilitation  ONSET DATE: 08/08/21  SUBJECTIVE:   SUBJECTIVE STATEMENT: Patient reports no issues.   PERTINENT HISTORY: Patellar fracture 2 years ago. DM, dialysis M, W, F  PAIN:  Are you having pain? Yes 0/10 mostly stiffness/tightness. Pt is very swollen   FALLS:  Has patient fallen in last 6 months? No  LIVING ENVIRONMENT: Lives with: lives with their spouse Lives in: House/apartment Stairs: Yes: External: 12 steps; on right going up Has following equipment at home: Walker - 2 wheeled, cane, has BSC, but not using.  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  PLOF: Independent  PATIENT GOALS Get back to as normal as possible.   OBJECTIVE:   EDEMA: R LE heavily bandaged, swelling present.  POSTURE: No Significant postural limitations  PALPATION: Deferred R knee heavily bandaged  LOWER EXTREMITY ROM: Other ROM grossly WFL  Passive ROM Right eval Left eval RT AROM Sitting 08/31/21  Hip flexion     Hip extension     Hip abduction     Hip adduction     Hip internal rotation     Hip external rotation     Knee flexion 91  105  Knee extension 23  10  Ankle dorsiflexion     Ankle  plantarflexion     Ankle inversion     Ankle eversion      (Blank rows = not tested)  LOWER EXTREMITY MMT:  MMT Right eval Left eval  Hip flexion 3+ 4  Hip extension 3+ 4  Hip abduction 3 4  Hip adduction    Hip internal rotation    Hip external rotation    Knee flexion 3- 4  Knee extension 2+ 4  Ankle dorsiflexion 3+ 4  Ankle plantarflexion    Ankle inversion    Ankle eversion     (Blank rows = not tested)  GAIT: Distance walked: 42' Assistive device utilized: Environmental consultant - 2 wheeled Level of assistance: Modified independence Comments: Step to gait pattern, walker too low, raised 1 notch, may be able to raise one more. Antalgic on R with decreased WB and stance time.    TODAY'S TREATMENT: 09/12/21  Functional re-assessment for PN. Knee flexion R 15#, 2 x 10 reps Knee ext, R 5#, 2 x 10 reps Ambulated outdoors on unlevel surfaces x800', no AD, no unsteadiness noted. AROM, R knee 5-114 Vaso-med pressure, 34, R knee, 15 min with elevation.  09/07/21 NuStep L4 x 6 min LE only  Recumbent bike L1 x3 min  Heel raises black bar 2x10  Resisted gait 40lb 4 way  x 3 each  6in step ups RLE x2 Hamstring curls RLE 15lb 2x10  Leg Ext 5lb RLE 2x10 Sit to stands from airex 2x10 Vaso R knee Med, 34 deg x 10 mn    09/05/21 NuStep L5 x 6 min LE only 6 in step ups x 10 each some UE assist Heel raises on black bar 2x10 Resisted gait 30lb 4 way  x 3 each  Hamstring curls RLE 15lb 2x10  Leg Ext 5lb RLE 2x10 Sit to stands on aires 2x10 Leg press 40lb 2x10, RLE 20lb 3 x5 Vaso R knee Med, 34 deg x 10 mn    08/31/21  Nustep L 5 6 min LE only HS curls 20# 2 sets 10- cued to use legs = Knee ext 5# 2 sets 10- cued to use RT equally Leg PRess 60 # 2 sets 10, Rt LE 20# 3 sets 5- ROM had to be greater than 90 or he could not press 6 inch step up fwd and laterally 15 x each with ue support- focus on TKE Green tband TKE standing 2 sets 10 Black bar calf raise 20 x STS on airex 10 x LAQ 3  # 2 sets 10 with TKE hold 3 sec Standing HS curl 3# 2 sets 10 VASO RT knee with elevation   08/29/21 NuStep L5 x 6 min  R knee PROM  S2S x10 4in step ups RLE 3x5 Hamstring curls RLE 15lb 2x10  Leg Ext RLE 5lb 2x10 Stairs 4 & 6 inch steps Alt pattern one rail R Leg press 40lb x10, 60lb x10, RLE 20lb x5 `  Vaso to R knee x 10 minutes, med pressure, 34 deg     PATIENT EDUCATION:  Education details: HEP,  Person educated: Patient Education method: Explanation, Demonstration, and Handouts Education comprehension: verbalized understanding and returned demonstration   HOME EXERCISE PROGRAM:  [id number]  ASSESSMENT:  CLINICAL IMPRESSION: Pt reports no issues. His knee remains severely swollen, but his strength and ROM are consistently improving as is his ambulation. He would benefit from continued treatment for progressive strengthening and ROM to ensure full recovery of function in the knee.   OBJECTIVE IMPAIRMENTS Abnormal gait, decreased activity tolerance, decreased balance, decreased coordination, decreased endurance, difficulty walking, decreased ROM, decreased strength, increased edema, impaired flexibility, postural dysfunction, and pain.   ACTIVITY LIMITATIONS carrying, lifting, bending, sitting, standing, squatting, stairs, transfers, bed mobility, bathing, toileting, dressing, hygiene/grooming, and locomotion level  PARTICIPATION LIMITATIONS: meal prep, cleaning, laundry, shopping, community activity, occupation, and yard work  PERSONAL FACTORS Past/current experiences and 1 comorbidity: DM with dialysis,  are also affecting patient's functional outcome.   REHAB POTENTIAL: Good  CLINICAL DECISION MAKING: Evolving/moderate complexity  EVALUATION COMPLEXITY: Moderate   GOALS: Goals reviewed with patient? Yes  SHORT TERM GOALS: Target date: 09/07/2021  I with basic HEP Baseline: Goal status: met  2.  Increase R knee ROM to 15-110 degrees. Baseline: 10-105,  8/29-5-114 Goal status:  met   LONG TERM GOALS: Target date: 11/02/2021   I with final HEP Baseline:  Goal status: ongoing  2.  Improve R knee ROM to 5-120 Baseline: 23-91, 8/29-5-114 Goal status: ongoing  3.  Increased RLE strength to at least 4-/5 throughout Baseline: 2+-3-/5, 8/29-4-/5 Goal status: met  4.  Increase FOTO to at least 72 Baseline: 46.7,8/29- 53 Goal status: ongoing  5.  Patient will ambulate I on level and unlevel surfaces x at least 1000' Baseline: RW, 80', multiple deviations., 8/29- 800', no AD, mildly decreased WB and stance time on R. Goal status: ongoing  PLAN: PT FREQUENCY:  2-3x/week  PT DURATION: 12 weeks  PLANNED INTERVENTIONS: Therapeutic exercises, Therapeutic activity, Neuromuscular re-education, Balance training, Gait training, Patient/Family education, Self Care, Joint mobilization, Stair training, Electrical stimulation, Cryotherapy, Moist heat, Compression bandaging, Vasopneumatic device, Manual therapy, and Re-evaluation  PLAN FOR NEXT SESSION: Update HEP as appropriate, ROM, strengthening   Encounter Date: 09/12/2021   Ethel Rana DPT 09/12/21 10:07 AM  09/12/2021, 10:07 AM  [place]. [place], [place], [address] Phone: [phone]   Fax:  [place]. [place], [place], [address] Phone: [phone]   Fax:  [phone]

## 2021-09-14 ENCOUNTER — Encounter: Payer: Self-pay | Admitting: Physical Therapy

## 2021-09-14 ENCOUNTER — Ambulatory Visit: Payer: BC Managed Care – PPO | Admitting: Physical Therapy

## 2021-09-14 DIAGNOSIS — R262 Difficulty in walking, not elsewhere classified: Secondary | ICD-10-CM

## 2021-09-14 DIAGNOSIS — R278 Other lack of coordination: Secondary | ICD-10-CM

## 2021-09-14 DIAGNOSIS — R600 Localized edema: Secondary | ICD-10-CM

## 2021-09-14 DIAGNOSIS — R2689 Other abnormalities of gait and mobility: Secondary | ICD-10-CM

## 2021-09-14 DIAGNOSIS — R2681 Unsteadiness on feet: Secondary | ICD-10-CM

## 2021-09-14 DIAGNOSIS — M6281 Muscle weakness (generalized): Secondary | ICD-10-CM

## 2021-09-14 DIAGNOSIS — M25561 Pain in right knee: Secondary | ICD-10-CM

## 2021-09-14 NOTE — Therapy (Signed)
OUTPATIENT PHYSICAL THERAPY LOWER EXTREMITY  Patient Name: Alex Morrison MRN: [id number] DOB:Aug 02, 1970, 51 y.o., male Today's Date: 09/14/2021   PT End of Session - 09/14/21 0931     Visit Number 11    Date for PT Re-Evaluation 11/02/21    PT Start Time 0927    PT Stop Time 1008    PT Time Calculation (min) 41 min    Activity Tolerance Patient tolerated treatment well    Behavior During Therapy [facility] for tasks assessed/performed           PHYSICAL THERAPY DISCHARGE SUMMARY  Visits from Start of Care: 11  Current functional level related to goals / functional outcomes: Progressing well most goals met, but he would like to D/C due to schedule conflicts   Remaining deficits: Needs to continue to work on ROM and Conservator, museum/gallery / Equipment: HEP   Patient agrees to discharge. Patient goals were partially met. Patient is being discharged due to being pleased with the current functional level.    Reporting Period 08/10/21 to 09/12/21  See note below for Objective Data and Assessment of Progress/Goals.       Past Medical History:  Diagnosis Date   Abscess of buttock    Acute kidney injury ([place]) 09/27/2013   Depression    Diabetes mellitus without complication ([place])    Diabetic neuropathy ([place])    Dialysis patient ([place])    M,W,F   ESRD (end stage renal disease) ([place])    Glaucoma    HTN (hypertension) 08/08/2018   Obesity    Perforated appendix    Peritonitis   Psoriasis    Retinopathy    Past Surgical History:  Procedure Laterality Date   ABSCESS      RECTAL    APPENDECTOMY     AV FISTULA PLACEMENT Left 01/13/2019   Procedure: LEFT ARM ARTERIOVENOUS (AV) FISTULA CREATION;  Surgeon: Elam Dutch, MD;  Location: [place];  Service: Vascular;  Laterality: Left;   CATARACT EXTRACTION     Ex lap with ileocecectomy  09/27/2013   Dr. Brantley Stage   HARDWARE REMOVAL Right 06/13/2021   Procedure: HARDWARE REMOVAL;  Surgeon: Marchia Bond, MD;  Location: [place];   Service: Orthopedics;  Laterality: Right;   ILEOCECETOMY     INCISION AND DRAINAGE     INCISIONAL HERNIA REPAIR N/A 08/11/2018   Procedure: OPEN RETRORECTUS REPAIR OF INCARCERATED INCISIONAL HERNIA WITH BILATERAL POSTERIOR COMPONENT SEPARATION;  Surgeon: [name], MD;  Location: [place];  Service: General;  Laterality: N/A;   INSERTION OF MESH  08/11/2018   Procedure: INSERTION OF PHASIX MESH;  Surgeon: [name], MD;  Location: [place];  Service: General;;   insertion of PD catheter  12/2017   IR FLUORO GUIDE CV LINE RIGHT  08/08/2018   IR US GUIDE VASC ACCESS RIGHT  08/08/2018   LAPAROSCOPIC ABDOMINAL EXPLORATION N/A 09/27/2013   LAPAROSCOPIC ASSISTED VENTRAL HERNIA REPAIR  2019   LAPAROSCOPY N/A 09/27/2013   Procedure: LAPAROSCOPY DIAGNOSTIC;  Surgeon: Erroll Luna, MD;  Location: [place];  Service: General;  Laterality: N/A;   LAPAROTOMY  08/11/2018   Procedure: EXPLORATORY LAPAROTOMY;  Surgeon: [name], MD;  Location: [place];  Service: General;;   LYSIS OF ADHESION  08/11/2018   Procedure: LYSIS OF ADHESIONS X 30 MINUTES;  Surgeon: [name], MD;  Location: [place];  Service: General;;   MINOR REMOVAL OF PERITONEAL DIALYSIS CATHETER N/A 01/06/2019   Procedure: REMOVAL OF PERITONEAL DIALYSIS CATHETER;  Surgeon: Kieth Brightly,  Arta Bruce, MD;  Location: [facility];  Service: General;  Laterality: N/A;   OPEN REDUCTION INTERNAL FIXATION (ORIF) TIBIA/FIBULA FRACTURE Right 03/07/2019   Procedure: OPEN REDUCTION INTERNAL FIXATION (ORIF) FIBULA FRACTURE;  Surgeon: Marchia Bond, MD;  Location: [place];  Service: Orthopedics;  Laterality: Right;   ORIF TIBIA PLATEAU Right 03/07/2019   Procedure: Open Reduction Internal Fixation (Orif) Tibial Plateau;  Surgeon: Marchia Bond, MD;  Location: [place];  Service: Orthopedics;  Laterality: Right;   SHOULDER SURGERY Left 1989   TOTAL KNEE ARTHROPLASTY Right 08/08/2021   Procedure: TOTAL KNEE ARTHROPLASTY;  Surgeon: Marchia Bond, MD;  Location: [place];   Service: Orthopedics;  Laterality: Right;   Patient Active Problem List   Diagnosis Date Noted   Arthrosis of knee 08/08/2021   S/P total knee replacement, right 08/08/2021   Tibial plateau fracture 03/06/2019   ESRD (end stage renal disease) ([place]) 08/08/2018   HTN (hypertension) 08/08/2018   SBO (small bowel obstruction) ([place]) 83/33/8329   Periumbilical hernia 19/16/6060   Abdominal hernia as complication of peritoneal dialysis 08/08/2018   Psoriasis 09/29/2013   Acute gangrenous appendicitis with perforation and peritonitis 09/27/2013   Type 1 diabetes mellitus ([place]) 09/27/2013   Acute kidney injury ([place]) 09/27/2013   DM (diabetes mellitus) type 2, uncontrolled, with ketoacidosis ([place]) 07/17/2012   DKA (diabetic ketoacidosis) ([place]) 07/16/2012   Perirectal abscess 07/14/2012    PCP: [place]wood Pao  REFERRING PROVIDER: Marchia Bond, MD   REFERRING DIAG: Diagnosis [phone] (ICD-10-CM) - Status post right knee replacement   THERAPY DIAG:  Difficulty in walking, not elsewhere classified  Other abnormalities of gait and mobility  Localized edema  Other lack of coordination  Muscle weakness (generalized)  Acute pain of right knee  Unsteadiness on feet  Rationale for Evaluation and Treatment Rehabilitation  ONSET DATE: 08/08/21  SUBJECTIVE:   SUBJECTIVE STATEMENT: Patient reports no issues.   PERTINENT HISTORY: Patellar fracture 2 years ago. DM, dialysis M, W, F  PAIN:  Are you having pain? Yes 0/10 mostly stiffness/tightness. Pt is very swollen   FALLS:  Has patient fallen in last 6 months? No  LIVING ENVIRONMENT: Lives with: lives with their spouse Lives in: House/apartment Stairs: Yes: External: 12 steps; on right going up Has following equipment at home: Walker - 2 wheeled, cane, has BSC, but not using.  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  PLOF: Independent  PATIENT GOALS Get back to as normal as possible.   OBJECTIVE:    EDEMA: R LE heavily bandaged, swelling present.  POSTURE: No Significant postural limitations  PALPATION: Deferred R knee heavily bandaged  LOWER EXTREMITY ROM: Other ROM grossly WFL  Passive ROM Right eval Left eval RT AROM Sitting 08/31/21  Hip flexion     Hip extension     Hip abduction     Hip adduction     Hip internal rotation     Hip external rotation     Knee flexion 91  105  Knee extension 23  10  Ankle dorsiflexion     Ankle plantarflexion     Ankle inversion     Ankle eversion      (Blank rows = not tested)  LOWER EXTREMITY MMT:  MMT Right eval Left eval  Hip flexion 3+ 4  Hip extension 3+ 4  Hip abduction 3 4  Hip adduction    Hip internal rotation    Hip external rotation    Knee flexion 3- 4  Knee extension  2+ 4  Ankle dorsiflexion 3+ 4  Ankle plantarflexion    Ankle inversion    Ankle eversion     (Blank rows = not tested)  GAIT: Distance walked: 59' Assistive device utilized: Environmental consultant - 2 wheeled Level of assistance: Modified independence Comments: Step to gait pattern, walker too low, raised 1 notch, may be able to raise one more. Antalgic on R with decreased WB and stance time.    TODAY'S TREATMENT: 09/14/21 NuStep L5 x 6 minutes Updated HEP, see written HEP for all exercises performed AROM for R knee 8-112  09/12/21 Functional re-assessment for PN. Knee flexion R 15#, 2 x 10 reps Knee ext, R 5#, 2 x 10 reps Ambulated outdoors on unlevel surfaces x800', no AD, no unsteadiness noted. AROM, R knee 5-114 Vaso-med pressure, 34, R knee, 15 min with elevation.  09/07/21 NuStep L4 x 6 min LE only  Recumbent bike L1 x3 min  Heel raises black bar 2x10  Resisted gait 40lb 4 way  x 3 each  6in step ups RLE x2 Hamstring curls RLE 15lb 2x10  Leg Ext 5lb RLE 2x10 Sit to stands from airex 2x10 Vaso R knee Med, 34 deg x 10 mn    09/05/21 NuStep L5 x 6 min LE only 6 in step ups x 10 each some UE assist Heel raises on black bar  2x10 Resisted gait 30lb 4 way  x 3 each  Hamstring curls RLE 15lb 2x10  Leg Ext 5lb RLE 2x10 Sit to stands on aires 2x10 Leg press 40lb 2x10, RLE 20lb 3 x5 Vaso R knee Med, 34 deg x 10 mn    08/31/21  Nustep L 5 6 min LE only HS curls 20# 2 sets 10- cued to use legs = Knee ext 5# 2 sets 10- cued to use RT equally Leg PRess 60 # 2 sets 10, Rt LE 20# 3 sets 5- ROM had to be greater than 90 or he could not press 6 inch step up fwd and laterally 15 x each with ue support- focus on TKE Green tband TKE standing 2 sets 10 Black bar calf raise 20 x STS on airex 10 x LAQ 3 # 2 sets 10 with TKE hold 3 sec Standing HS curl 3# 2 sets 10 VASO RT knee with elevation   08/29/21 NuStep L5 x 6 min  R knee PROM  S2S x10 4in step ups RLE 3x5 Hamstring curls RLE 15lb 2x10  Leg Ext RLE 5lb 2x10 Stairs 4 & 6 inch steps Alt pattern one rail R Leg press 40lb x10, 60lb x10, RLE 20lb x5 `  Vaso to R knee x 10 minutes, med pressure, 34 deg     PATIENT EDUCATION:  Education details: HEP,  Person educated: Patient Education method: Explanation, Demonstration, and Handouts Education comprehension: verbalized understanding and returned demonstration   HOME EXERCISE PROGRAM:  [id number]  ASSESSMENT:  CLINICAL IMPRESSION: Pt reports no issues.He requested to D/C from PT as he will be unable to attend next week and feels he can continue rehab at home. HEP program updated, emphasizing both strength and ROM of R knee, with education to alert Dr if he feels he is losing ROM or strength.  OBJECTIVE IMPAIRMENTS Abnormal gait, decreased activity tolerance, decreased balance, decreased coordination, decreased endurance, difficulty walking, decreased ROM, decreased strength, increased edema, impaired flexibility, postural dysfunction, and pain.   ACTIVITY LIMITATIONS carrying, lifting, bending, sitting, standing, squatting, stairs, transfers, bed mobility, bathing, toileting, dressing, hygiene/grooming, and  locomotion level  PARTICIPATION LIMITATIONS: meal prep, cleaning, laundry, shopping, community activity, occupation, and yard work  PERSONAL FACTORS Past/current experiences and 1 comorbidity: DM with dialysis,  are also affecting patient's functional outcome.   REHAB POTENTIAL: Good  CLINICAL DECISION MAKING: Evolving/moderate complexity  EVALUATION COMPLEXITY: Moderate   GOALS: Goals reviewed with patient? Yes  SHORT TERM GOALS: Target date: 09/07/2021  I with basic HEP Baseline: Goal status: met  2.  Increase R knee ROM to 15-110 degrees. Baseline: 10-105, 8/29-5-114 Goal status:  met   LONG TERM GOALS: Target date: 11/02/2021   I with final HEP Baseline:  Goal status: met  2.  Improve R knee ROM to 5-120 Baseline: 23-91, 8/29-5-114 Goal status: partially met  3.  Increased RLE strength to at least 4-/5 throughout Baseline: 2+-3-/5, 8/29-4-/5 Goal status: met  4.  Increase FOTO to at least 72 Baseline: 46.7,8/29- 53 Goal status: not met  5.  Patient will ambulate I on level and unlevel surfaces x at least 1000' Baseline: RW, 80', multiple deviations., 8/29- 800', no AD, mildly decreased WB and stance time on R. Goal status: met  PLAN: PT FREQUENCY:  2-3x/week  PT DURATION: 12 weeks  PLANNED INTERVENTIONS: Therapeutic exercises, Therapeutic activity, Neuromuscular re-education, Balance training, Gait training, Patient/Family education, Self Care, Joint mobilization, Stair training, Electrical stimulation, Cryotherapy, Moist heat, Compression bandaging, Vasopneumatic device, Manual therapy, and Re-evaluation  PLAN FOR NEXT SESSION: Update HEP as appropriate, ROM, strengthening   Encounter Date: 09/14/2021   Ethel Rana DPT 09/14/21 10:10 AM  09/14/2021, 10:10 AM  [facility] [place], [place], [address] Phone: [phone]   Fax:  [facility] [place], [place], [address] Phone: [phone]   Fax:  [phone]

## 2022-02-12 IMAGING — CR DG KNEE COMPLETE 4+V*R*
4 series · 4 of 4 positions shown · non-contrast
Comparison: None.

CLINICAL DATA: Patient reports right anterior and lateral knee
pains after approximate 4 ft fall off a loading dock today. Reports
unable to bear weight on right knee. Denies any prior right knee
injury or surgery. Hx of diabetes.

EXAM:
RIGHT KNEE - COMPLETE 4+ VIEW

[knee ap]
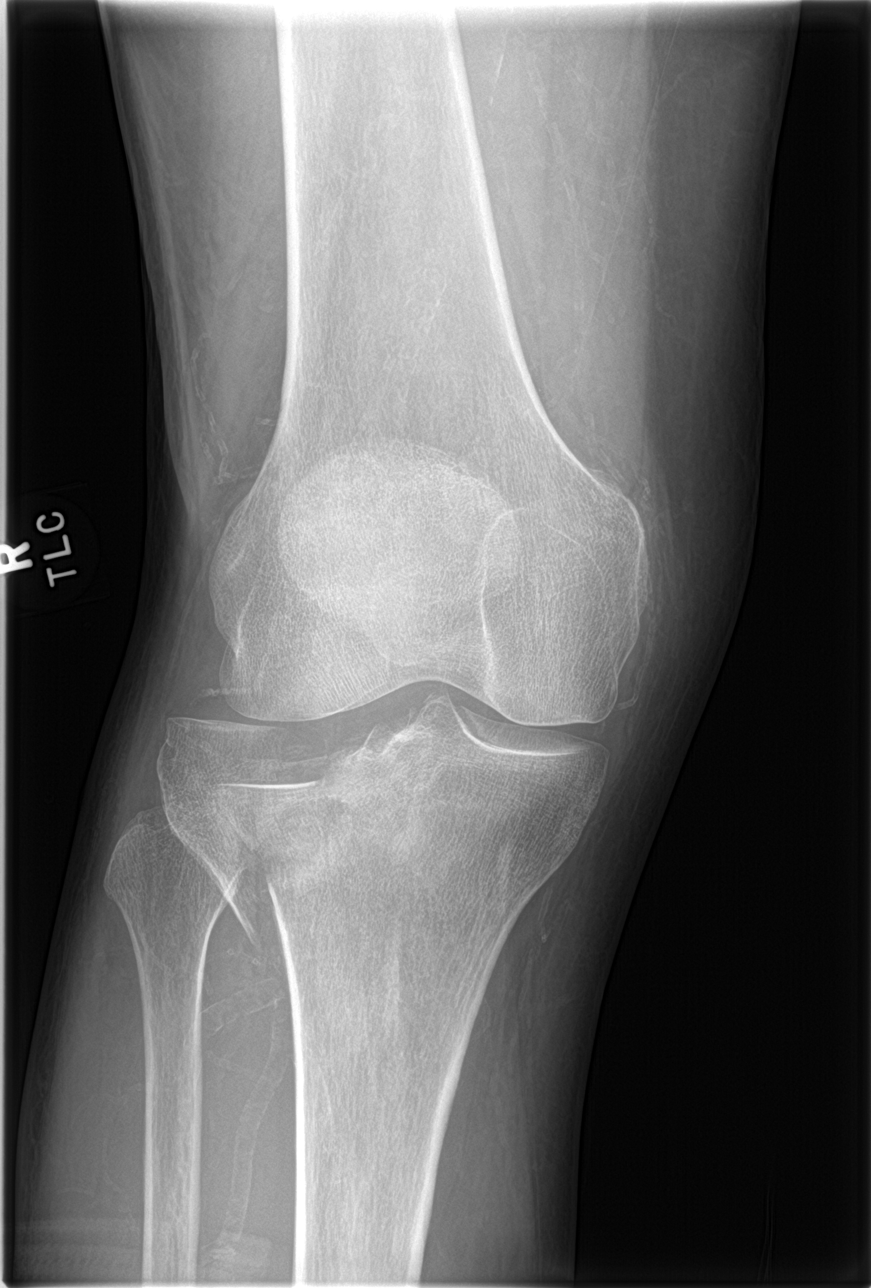

[knee lat]
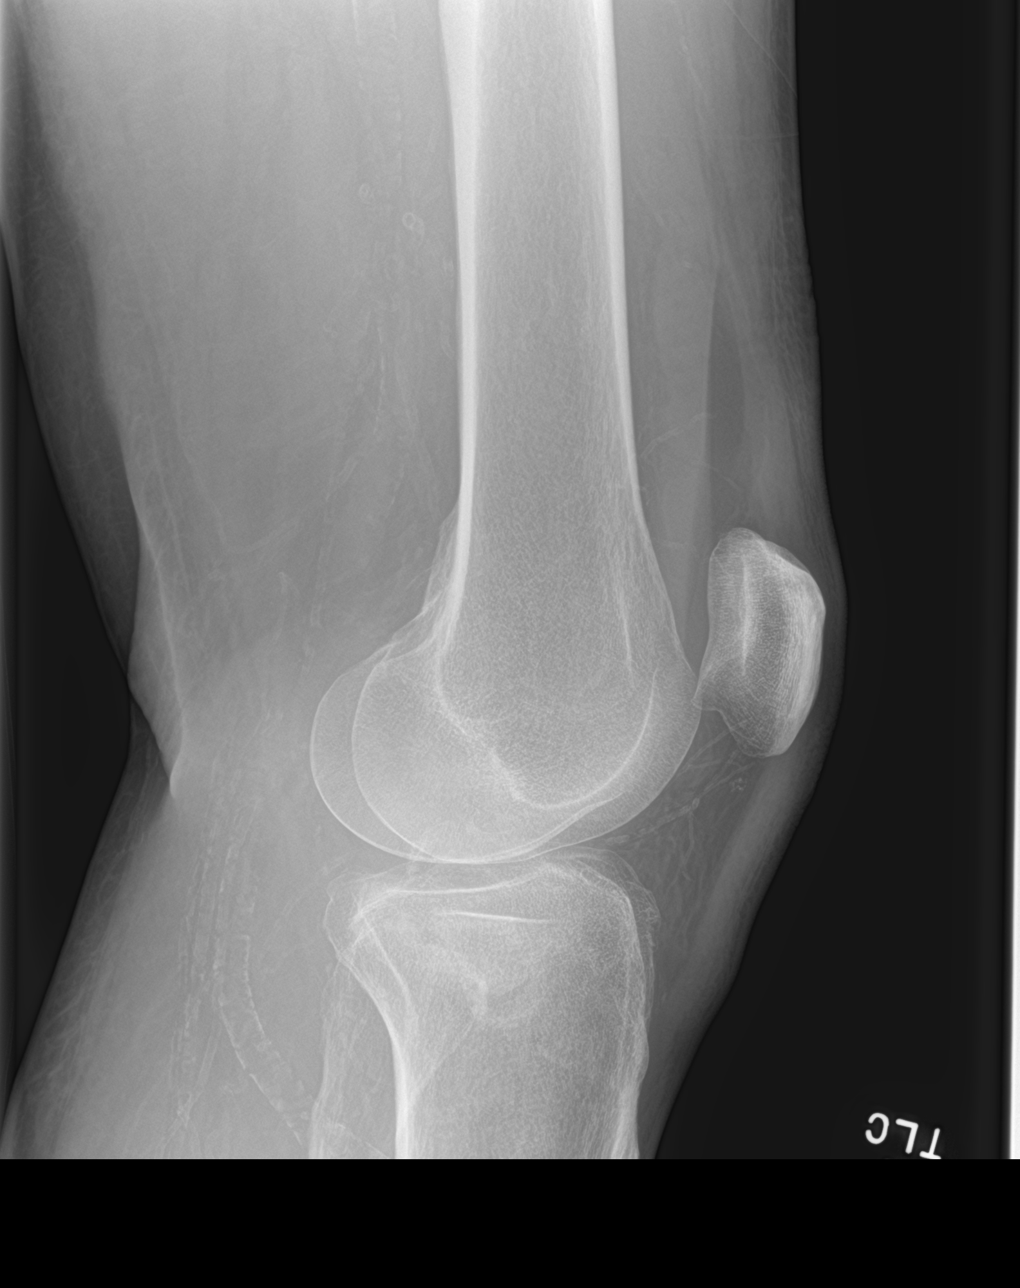

[knee obl (1 of 2)]
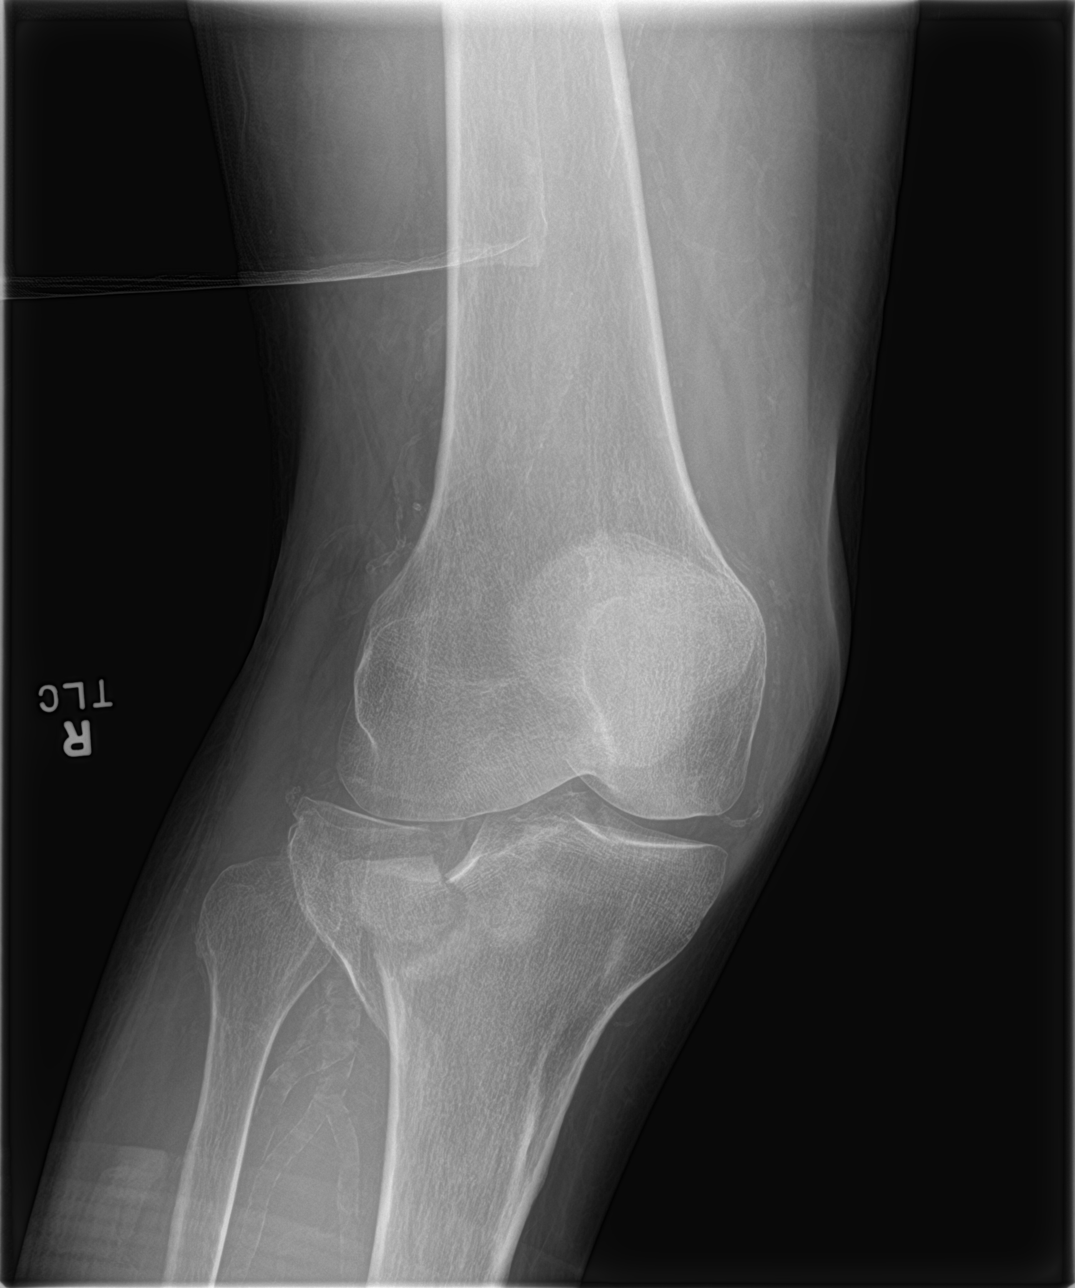

[knee obl (2 of 2)]
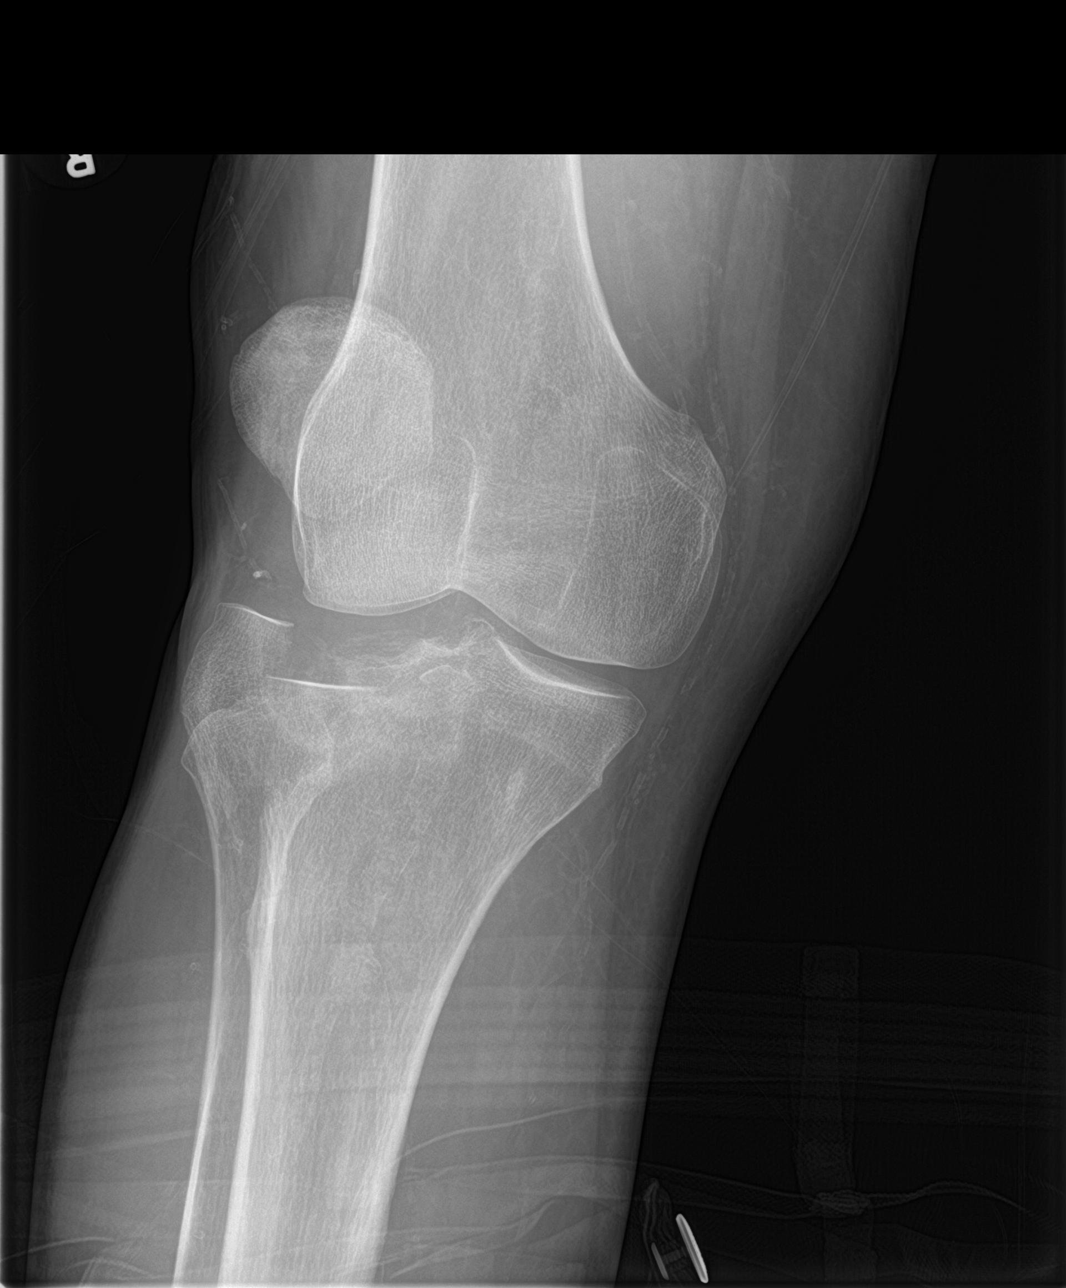

[4 of 4 positions shown; findings below may reference images not displayed]

FINDINGS: Comminuted, mildly displaced and depressed fracture of the lateral
tibial plateau. Maximum depression of a component of the fracture is
1.2 cm from the expected location of the articular surface.
Fractures also mildly displaced laterally by approximately 1 cm. A
component of the fracture extends to the medial margin of the tibial
spine.

No other fractures.  No dislocation.

Fat fluid level reflecting a hemarthrosis distends the suprapatellar
joint capsule.

There are arterial atherosclerotic calcifications posteriorly,
advanced for age.
IMPRESSION: 1. Comminuted, displaced and depressed fracture of the lateral
tibial plateau associated with a hemarthrosis. No dislocation.

## 2022-02-12 IMAGING — CR DG ANKLE COMPLETE 3+V*R*
3 series · 3 of 3 positions shown · non-contrast
Comparison: No recent prior.

CLINICAL DATA: Fall.

EXAM:
RIGHT ANKLE - COMPLETE 3+ VIEW

[ankle ap]
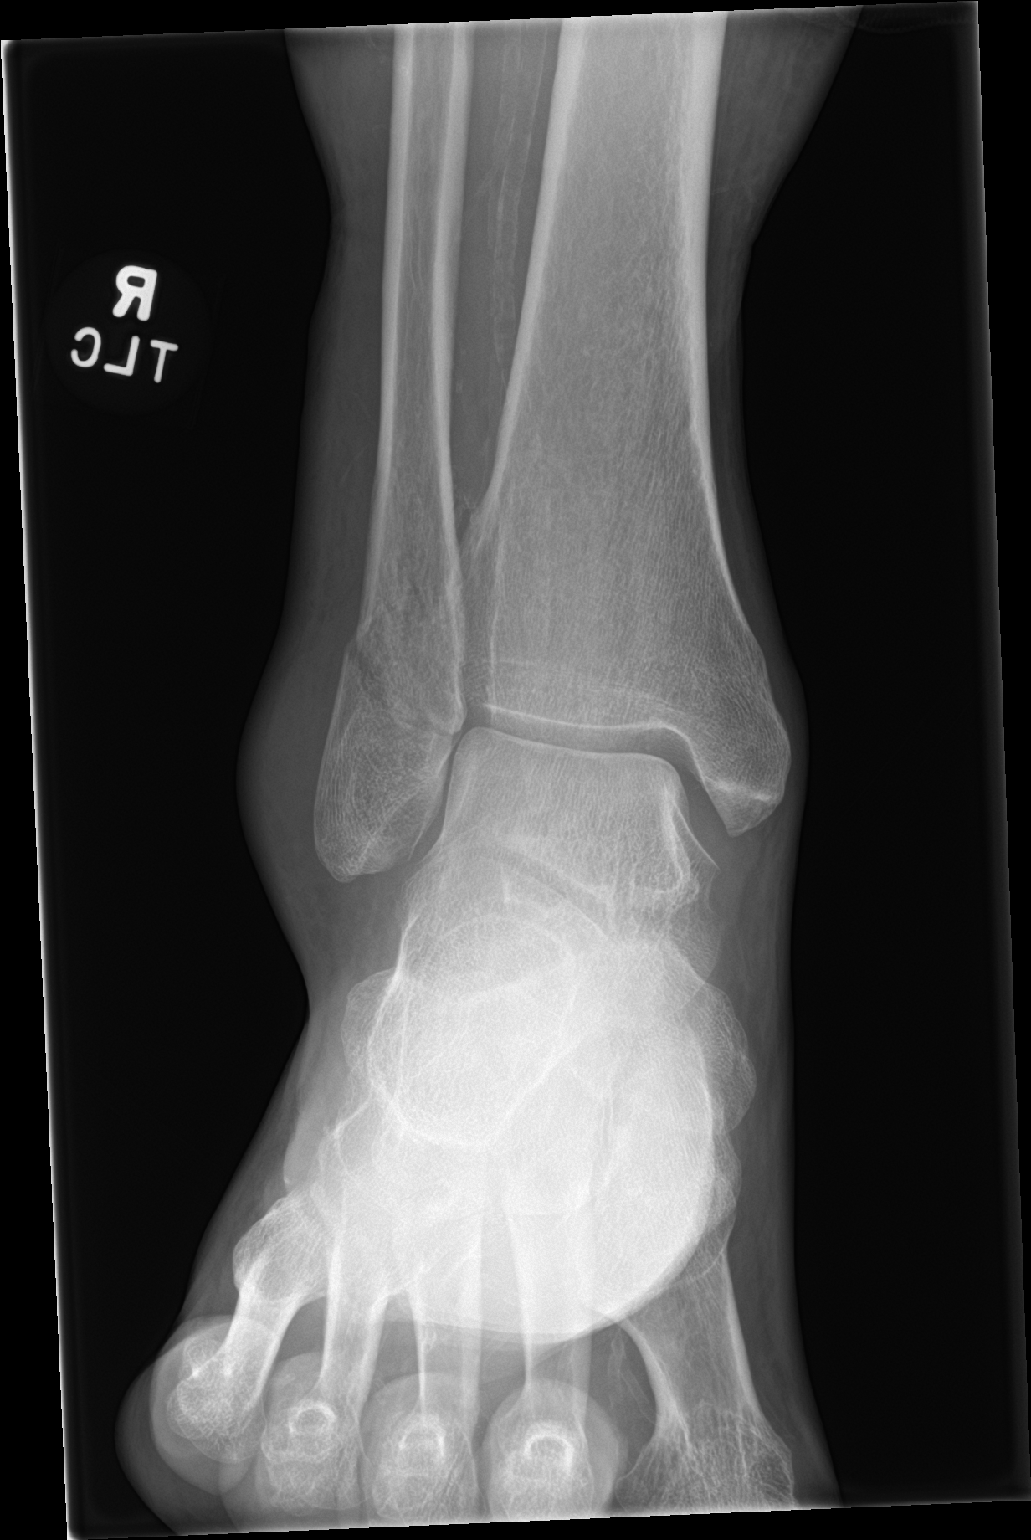

[ankle lat]
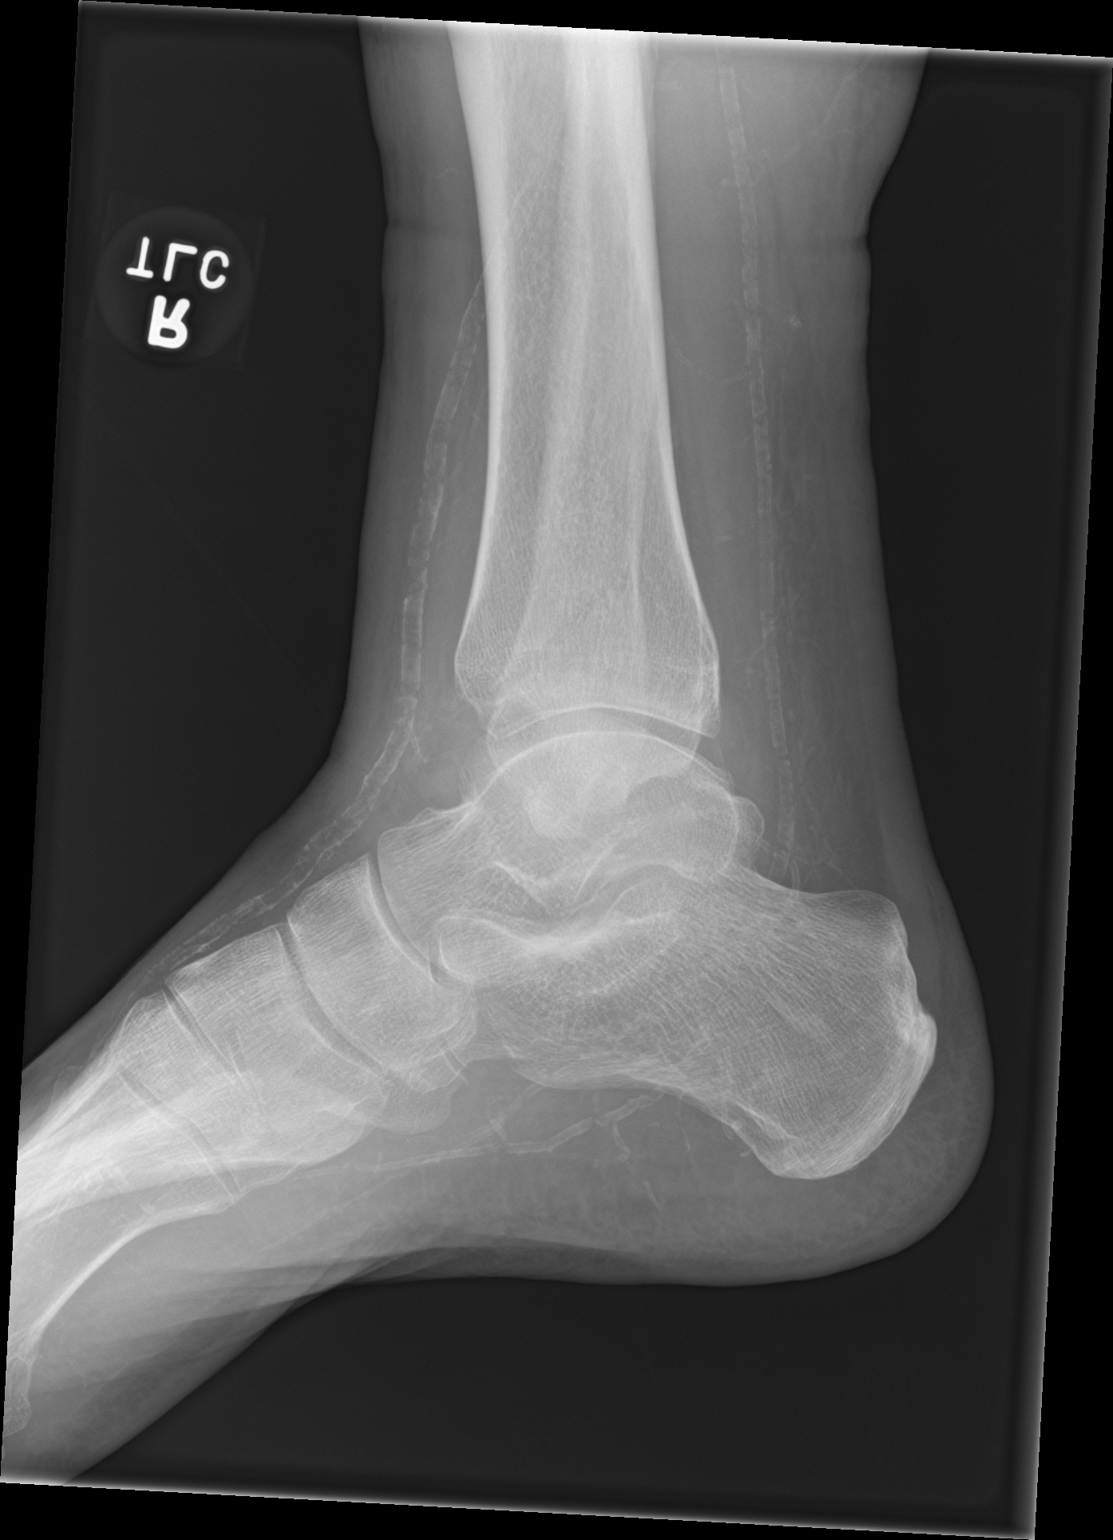

[ankle obl]
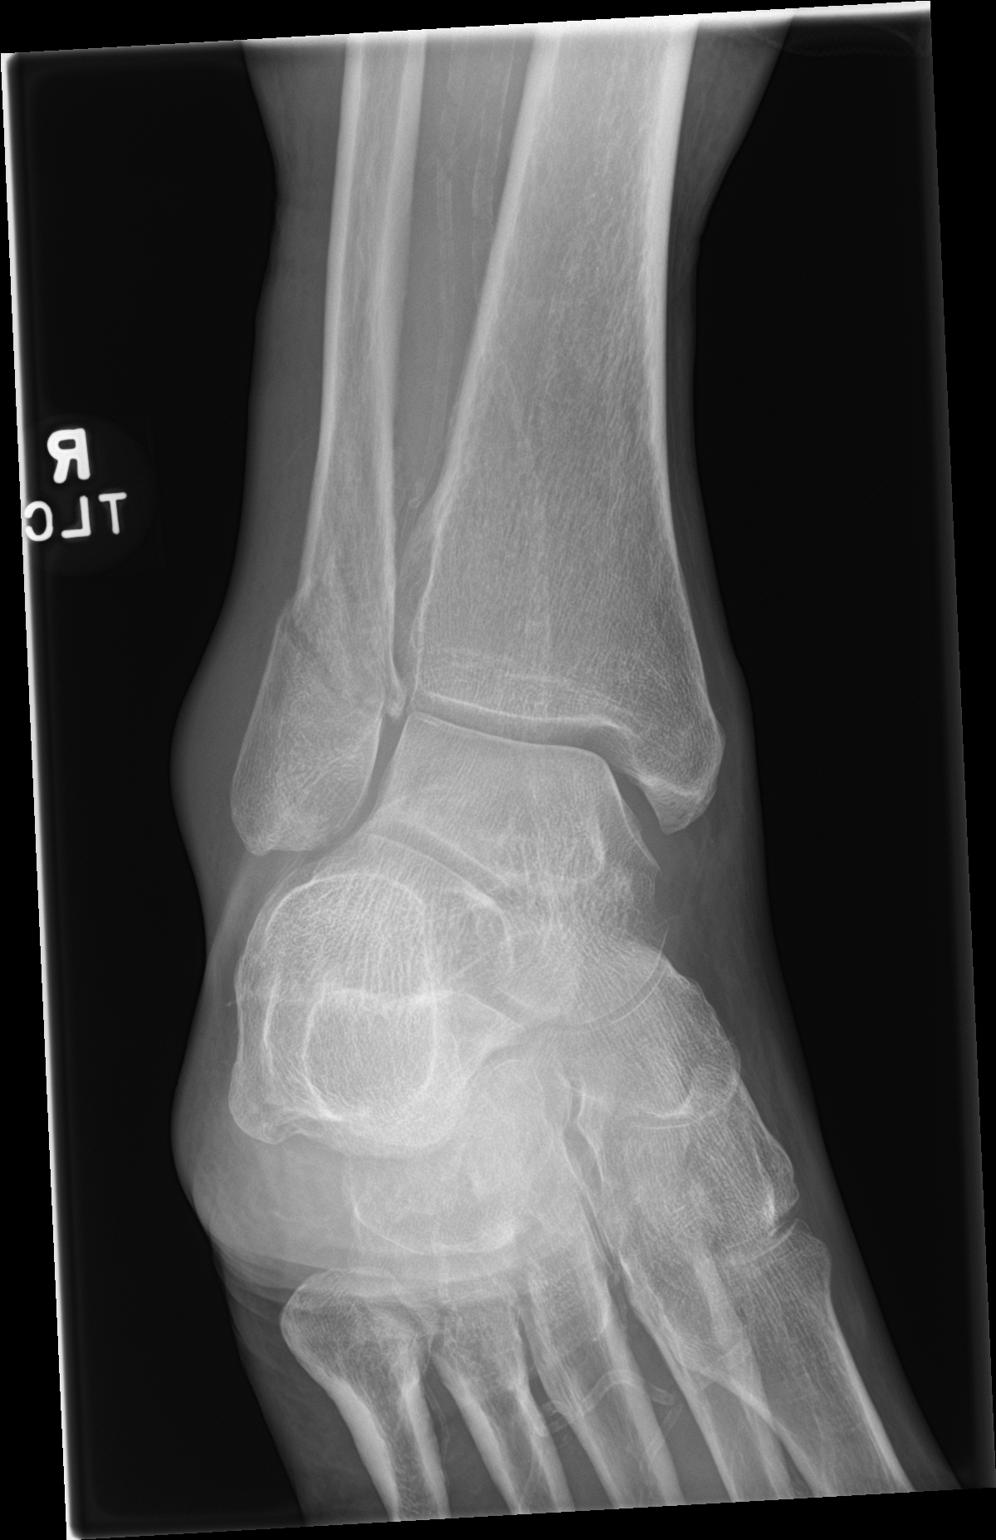

[3 of 3 positions shown; findings below may reference images not displayed]

FINDINGS: Soft tissue swelling over the lateral malleolus. Slightly displaced
fracture noted of the distal right fibula. Medial malleolus appears
intact. Peripheral vascular calcification.
IMPRESSION: 1. Prominent soft tissue swelling over the lateral malleolus.
Slightly displaced fracture of the distal right fibula noted.

2.  Peripheral vascular disease.

## 2022-03-12 LAB — LAB REPORT - SCANNED: EGFR (Non-African Amer.): 14

## 2022-05-01 ENCOUNTER — Ambulatory Visit (INDEPENDENT_AMBULATORY_CARE_PROVIDER_SITE_OTHER): Payer: Medicare Other

## 2022-05-01 ENCOUNTER — Encounter: Payer: Self-pay | Admitting: Internal Medicine

## 2022-05-01 ENCOUNTER — Ambulatory Visit: Payer: Medicare Other

## 2022-05-01 ENCOUNTER — Ambulatory Visit: Payer: Medicare Other | Attending: Internal Medicine | Admitting: Internal Medicine

## 2022-05-01 VITALS — BP 157/78 | HR 75 | Resp 16 | Ht 71.0 in | Wt 214.0 lb

## 2022-05-01 DIAGNOSIS — Z79899 Other long term (current) drug therapy: Secondary | ICD-10-CM | POA: Diagnosis present

## 2022-05-01 DIAGNOSIS — R700 Elevated erythrocyte sedimentation rate: Secondary | ICD-10-CM

## 2022-05-01 DIAGNOSIS — M1990 Unspecified osteoarthritis, unspecified site: Secondary | ICD-10-CM

## 2022-05-01 DIAGNOSIS — N186 End stage renal disease: Secondary | ICD-10-CM

## 2022-05-01 DIAGNOSIS — M1710 Unilateral primary osteoarthritis, unspecified knee: Secondary | ICD-10-CM | POA: Diagnosis present

## 2022-05-01 DIAGNOSIS — L409 Psoriasis, unspecified: Secondary | ICD-10-CM

## 2022-05-01 LAB — SYNOVIAL FLUID ANALYSIS, COMPLETE
Basophils, %: 0 %
Eosinophils-Synovial: 0 % (ref 0–2)
Lymphocytes-Synovial Fld: 2 % (ref 0–74)
Monocyte/Macrophage: 13 % (ref 0–69)
Neutrophil, Synovial: 85 % — ABNORMAL HIGH (ref 0–24)
Synoviocytes, %: 0 % (ref 0–15)
WBC, Synovial: 17050 cells/uL — ABNORMAL HIGH (ref <150)

## 2022-05-01 NOTE — Progress Notes (Signed)
Office Visit Note  Patient: Alex Morrison             Date of Birth: 1970/10/05           MRN: [id number]             PCP: Gaspar Garbe, MD Referring: Gaspar Garbe, MD Visit Date: 05/01/2022  Subjective:  New Patient (Initial Visit) (Patient states most of the joint pain is in his hands. Patient states he still has joint pain in his wrists, shoulder, left leg, and hips.)   History of Present Illness: Alex Morrison is a 52 y.o. male here for evaluation of joint pain and swelling at multiple sites with considerable symptoms ongoing for the past 2 to 3 months.  His worst complaint area has mostly been with bilateral hand pain and swelling.  Also getting some symptoms in the left knee with associated swelling.  His right knee has been consistently swollen and with limited range of motion ever since he had joint replacement surgery last year.  He avoids taking any oral prednisone as previous treatment because severely uncontrolled diabetes and so he avoids and refuses taking steroids.  Currently using Tylenol and Aleve as needed which are partially helpful.  He does have end-stage renal disease on hemodialysis secondary to hypertension and diabetes.  He also has a history of skin psoriasis but never previously diagnosed with associated inflammatory arthritis.  He was never on systemic medical treatment for this either due to low symptom severity.  Labs reviewed ESR 80 CRP 71  Activities of Daily Living:  Patient reports morning stiffness for 30-60 minutes.   Patient Reports nocturnal pain.  Difficulty dressing/grooming: Reports Difficulty climbing stairs: Reports Difficulty getting out of chair: Reports Difficulty using hands for taps, buttons, cutlery, and/or writing: Reports  Review of Systems  Constitutional:  Positive for fatigue.  HENT:  Negative for mouth sores and mouth dryness.   Eyes:  Negative for dryness.  Respiratory:  Negative for shortness of breath.    Cardiovascular:  Negative for chest pain and palpitations.  Gastrointestinal:  Negative for blood in stool, constipation and diarrhea.  Endocrine: Negative for increased urination.  Genitourinary:  Negative for involuntary urination.  Musculoskeletal:  Positive for joint pain, joint pain, joint swelling, myalgias, muscle weakness, morning stiffness, muscle tenderness and myalgias. Negative for gait problem.  Skin:  Negative for color change, rash, hair loss and sensitivity to sunlight.  Allergic/Immunologic: Negative for susceptible to infections.  Neurological:  Negative for dizziness and headaches.  Hematological:  Negative for swollen glands.  Psychiatric/Behavioral:  Positive for sleep disturbance. Negative for depressed mood. The patient is nervous/anxious.     PMFS History:  Patient Active Problem List   Diagnosis Date Noted   Sedimentation rate elevation 05/01/2022   Arthrosis of knee 08/08/2021   S/P total knee replacement, right 08/08/2021   Tibial plateau fracture 03/06/2019   ESRD (end stage renal disease) (HCC) 08/08/2018   HTN (hypertension) 08/08/2018   SBO (small bowel obstruction) (HCC) 08/08/2018   Periumbilical hernia 08/08/2018   Abdominal hernia as complication of peritoneal dialysis 08/08/2018   Psoriasis 09/29/2013   Acute gangrenous appendicitis with perforation and peritonitis 09/27/2013   Type 1 diabetes mellitus (HCC) 09/27/2013   Acute kidney injury (HCC) 09/27/2013   DM (diabetes mellitus) type 2, uncontrolled, with ketoacidosis (HCC) 07/17/2012   DKA (diabetic ketoacidosis) (HCC) 07/16/2012   Perirectal abscess 07/14/2012    Past Medical History:  Diagnosis Date   Abscess  of buttock    Acute kidney injury (HCC) 09/27/2013   Depression    Diabetes mellitus without complication (HCC)    Diabetic neuropathy (HCC)    Dialysis patient (HCC)    M,W,F   ESRD (end stage renal disease) (HCC)    Glaucoma    HTN (hypertension) 08/08/2018   Obesity     Perforated appendix    Peritonitis   Psoriasis    Retinopathy     Family History  Problem Relation Age of Onset   Diabetes Mellitus II Mother    Heart disease Mother    Diabetes Mother    Diabetes Mellitus I Father    Diabetes Father    Diabetes Mellitus II Brother    Colon cancer Neg Hx    Esophageal cancer Neg Hx    Pancreatic cancer Neg Hx    Stomach cancer Neg Hx    Past Surgical History:  Procedure Laterality Date   ABSCESS      RECTAL    APPENDECTOMY     AV FISTULA PLACEMENT Left 01/13/2019   Procedure: LEFT ARM ARTERIOVENOUS (AV) FISTULA CREATION;  Surgeon: Sherren Kerns, MD;  Location: [facility] OR;  Service: Vascular;  Laterality: Left;   CATARACT EXTRACTION     Ex lap with ileocecectomy  09/27/2013   Dr. Luisa Hart   HARDWARE REMOVAL Right 06/13/2021   Procedure: HARDWARE REMOVAL;  Surgeon: Teryl Lucy, MD;  Location: [facility] OR;  Service: Orthopedics;  Laterality: Right;   ILEOCECETOMY     INCISION AND DRAINAGE     INCISIONAL HERNIA REPAIR N/A 08/11/2018   Procedure: OPEN RETRORECTUS REPAIR OF INCARCERATED INCISIONAL HERNIA WITH BILATERAL POSTERIOR COMPONENT SEPARATION;  Surgeon: Gaynelle Adu, MD;  Location: [facility] OR;  Service: General;  Laterality: N/A;   INSERTION OF MESH  08/11/2018   Procedure: INSERTION OF PHASIX MESH;  Surgeon: Gaynelle Adu, MD;  Location: [facility] OR;  Service: General;;   insertion of PD catheter  12/2017   IR FLUORO GUIDE CV LINE RIGHT  08/08/2018   IR US GUIDE VASC ACCESS RIGHT  08/08/2018   LAPAROSCOPIC ABDOMINAL EXPLORATION N/A 09/27/2013   LAPAROSCOPIC ASSISTED VENTRAL HERNIA REPAIR  2019   LAPAROSCOPY N/A 09/27/2013   Procedure: LAPAROSCOPY DIAGNOSTIC;  Surgeon: Harriette Bouillon, MD;  Location: [facility] OR;  Service: General;  Laterality: N/A;   LAPAROTOMY  08/11/2018   Procedure: EXPLORATORY LAPAROTOMY;  Surgeon: Gaynelle Adu, MD;  Location: [facility] OR;  Service: General;;   LYSIS OF ADHESION  08/11/2018   Procedure: LYSIS OF ADHESIONS X 30 MINUTES;   Surgeon: Gaynelle Adu, MD;  Location: [facility] OR;  Service: General;;   MINOR REMOVAL OF PERITONEAL DIALYSIS CATHETER N/A 01/06/2019   Procedure: REMOVAL OF PERITONEAL DIALYSIS CATHETER;  Surgeon: Sheliah Hatch De Blanch, MD;  Location: [facility] ORS;  Service: General;  Laterality: N/A;   OPEN REDUCTION INTERNAL FIXATION (ORIF) TIBIA/FIBULA FRACTURE Right 03/07/2019   Procedure: OPEN REDUCTION INTERNAL FIXATION (ORIF) FIBULA FRACTURE;  Surgeon: Teryl Lucy, MD;  Location: [facility] OR;  Service: Orthopedics;  Laterality: Right;   ORIF TIBIA PLATEAU Right 03/07/2019   Procedure: Open Reduction Internal Fixation (Orif) Tibial Plateau;  Surgeon: Teryl Lucy, MD;  Location: [facility] OR;  Service: Orthopedics;  Laterality: Right;   SHOULDER SURGERY Left 1989   TOTAL KNEE ARTHROPLASTY Right 08/08/2021   Procedure: TOTAL KNEE ARTHROPLASTY;  Surgeon: Teryl Lucy, MD;  Location: [facility] OR;  Service: Orthopedics;  Laterality: Right;   Social History   Social History Narrative   Not on file   Immunization  History  Administered Date(s) Administered   Hepatitis B, ADULT 12/25/2017, 02/20/2018, 03/21/2018, 06/24/2018, 09/03/2018, 10/01/2018, 11/05/2018, 03/04/2019   Influenza,inj,Quad PF,6+ Mos 10/15/2018, 10/16/2019   Influenza,inj,quad, With Preservative 12/24/2017   Influenza-Unspecified 10/15/2018   PFIZER(Purple Top)SARS-COV-2 Vaccination 09/11/2019   Pneumococcal Conjugate-13 12/25/2017   Pneumococcal Polysaccharide-23 09/30/2013, 12/12/2018     Objective: Vital Signs: BP (!) 157/78 (BP Location: Right Arm, Patient Position: Sitting, Cuff Size: Normal)   Pulse 75   Resp 16   Ht 5\' 11"  (1.803 m)   Wt 214 lb (97.1 kg)   BMI 29.85 kg/m    Physical Exam Eyes:     Conjunctiva/sclera: Conjunctivae normal.  Cardiovascular:     Rate and Rhythm: Normal rate and regular rhythm.     Pulses: Normal pulses.     Heart sounds: Normal heart sounds.  Lymphadenopathy:     Cervical: No cervical adenopathy.  Skin:     Findings: Rash present.  Neurological:     Mental Status: He is alert.      Musculoskeletal Exam:  Elbows full ROM no tenderness or swelling Wrists full ROM no tenderness or swelling Fingers with some tenderness and joint nodules present both at base of thumb and distal finger joints, no warmth or erythema Hip normal internal and external rotation without pain, no tenderness to lateral hip palpation Bilateral knee swelling right knee restricted mobility left knee more tender with movement and palpation   Investigation: No additional findings.  Imaging: XR Hand 2 View Left  Result Date: 05/01/2022 X-ray left hand 2 views Radiocarpal and carpal joints appear normal.  First MCP with cysts and metacarpal osteophyte process.  MCP joints appear normal.  Fifth PIP with large bone cysts and decreased joint space.  DIP joint space narrowing throughout several cystic changes worst at second digit without any definite erosion.  Bone mineralization appears normal. Impression Osteoarthritis in DIP joints and fifth PIP, numerous bone cyst without definite erosion  XR Hand 2 View Right  Result Date: 05/01/2022 X-ray right hand 2 views Radiocarpal and carpal joint spaces appear normal.  Multiple cystic changes in the first MCP joint.  Joint space narrowing at second and fifth DIP joints , multiple lesions possible erosions versus cyst not well-visualized in multiple views and DIP joints most advanced on the third finger.  Vascular calcifications present visibly throughout.  Bone mineralization appears normal. Impression Osteoarthritis and first MCP and DIPs, multiple cystic lesions and possible periarticular erosions   Recent Labs: Lab Results  Component Value Date   WBC 6.4 05/01/2022   HGB 9.0 (L) 05/01/2022   PLT 322 05/01/2022   NA 139 05/01/2022   K 4.8 05/01/2022   CL 94 (L) 05/01/2022   CO2 32 05/01/2022   GLUCOSE 144 (H) 05/01/2022   BUN 45 (H) 05/01/2022   CREATININE 7.10 (H)  05/01/2022   BILITOT 0.5 05/01/2022   ALKPHOS 108 07/20/2020   AST 20 05/01/2022   ALT 14 05/01/2022   PROT 6.3 05/01/2022   ALBUMIN 3.8 07/20/2020   CALCIUM 9.1 05/01/2022   GFRAA 6 (L) 03/13/2019   QFTBGOLDPLUS NEGATIVE 05/01/2022    Speciality Comments: Only Check Blood Pressure in Right arm.   Procedures:  Large Joint Inj: L knee on 05/01/2022 2:25 PM Indications: pain, joint swelling and diagnostic evaluation Details: 22 G 1.5 in needle, superolateral approach Medications: 3 mL lidocaine 1 % Aspirate: 27 mL yellow and cloudy; sent for lab analysis Outcome: tolerated well, no immediate complications Procedure, treatment alternatives, risks and benefits  explained, specific risks discussed. Consent was given by the patient. Immediately prior to procedure a time out was called to verify the correct patient, procedure, equipment, support staff and site/side marked as required. Patient was prepped and draped in the usual sterile fashion.     Allergies: Patient has no known allergies.   Assessment / Plan:     Visit Diagnoses: Inflammatory arthritis - Plan: XR Hand 2 View Right, XR Hand 2 View Left, Sedimentation rate, Cyclic citrul peptide antibody, IgG, Mutated Citrullinated Vimentin (MCV) Antibody, Synovial Fluid Analysis, Complete, Large Joint Inj: L knee  Definite inflammation in multiple joints largest single effusion at the left knee.  Bilateral hand x-rays obtained these show mostly degenerative changes though cannot exclude erosions there is a lot of distal joint involvement as well.  Will check for additional specific antibody markers CCP MCV antibodies.  Affecting left knee sample synovial fluid analysis for confirming primary inflammatory process and also for crystalline arthropathy.  Psoriasis  Suspicious for psoriasis so question of presentation is a new flareup or exacerbation of underlying psoriatic arthritis.  The degree of distal joint involvement on hand x-rays would  be more consistent than expected and rheumatoid arthritis.  Previously replaced right knee unfortunately has limited mobility probably affected by some limitation with completing a very good amount of stretching and range of motion exercise with the other joint inflammation problems.  ESRD (end stage renal disease) (HCC) High risk medication use - Plan: CBC with Differential/Platelet, Hepatitis B core antibody, IgM, Hepatitis B surface antigen, Hepatitis C antibody, QuantiFERON-TB Gold Plus, COMPLETE METABOLIC PANEL WITH GFR  Checking CBC CMP hepatitis screening and QuantiFERON baseline for medication assessment.  Treatment options likely limited to biologic DMARD due to ESRD status.  Orders: Orders Placed This Encounter  Procedures   Large Joint Inj: L knee   XR Hand 2 View Right   XR Hand 2 View Left   Sedimentation rate   CBC with Differential/Platelet   Hepatitis B core antibody, IgM   Hepatitis B surface antigen   Hepatitis C antibody   QuantiFERON-TB Gold Plus   COMPLETE METABOLIC PANEL WITH GFR   Cyclic citrul peptide antibody, IgG   Mutated Citrullinated Vimentin (MCV) Antibody   Synovial Fluid Analysis, Complete   No orders of the defined types were placed in this encounter.    Follow-Up Instructions: Return in about 2 weeks (around 05/15/2022) for New pt ?RA inflammation/aspiration f/u 2wks.   Fuller Plan, MD  Note - This record has been created using [employer].  Chart creation errors have been sought, but may not always  have been located. Such creation errors do not reflect on  the standard of medical care.

## 2022-05-02 NOTE — Progress Notes (Signed)
Synovial fluid shows 17500 white blood cells this fits with an inflammatory arthritis like RA. The number is lower than would be expected for an infection. There are no crystals present. Other lab results are mostly still pending although his sedimentation rate is 31 that is mildly increased systemic inflammation.

## 2022-05-04 LAB — COMPLETE METABOLIC PANEL WITH GFR
AG Ratio: 1.1 (calc) (ref 1.0–2.5)
ALT: 14 U/L (ref 9–46)
AST: 20 U/L (ref 10–35)
Albumin: 3.3 g/dL — ABNORMAL LOW (ref 3.6–5.1)
Alkaline phosphatase (APISO): 379 U/L — ABNORMAL HIGH (ref 35–144)
BUN/Creatinine Ratio: 6 (calc) (ref 6–22)
BUN: 45 mg/dL — ABNORMAL HIGH (ref 7–25)
CO2: 32 mmol/L (ref 20–32)
Calcium: 9.1 mg/dL (ref 8.6–10.3)
Chloride: 94 mmol/L — ABNORMAL LOW (ref 98–110)
Creat: 7.1 mg/dL — ABNORMAL HIGH (ref 0.70–1.30)
Globulin: 3 g/dL (calc) (ref 1.9–3.7)
Glucose, Bld: 144 mg/dL — ABNORMAL HIGH (ref 65–139)
Potassium: 4.8 mmol/L (ref 3.5–5.3)
Sodium: 139 mmol/L (ref 135–146)
Total Bilirubin: 0.5 mg/dL (ref 0.2–1.2)
Total Protein: 6.3 g/dL (ref 6.1–8.1)
eGFR: 9 mL/min/{1.73_m2} — ABNORMAL LOW (ref >=60)

## 2022-05-04 LAB — CBC WITH DIFFERENTIAL/PLATELET
Absolute Monocytes: 659 cells/uL (ref 200–950)
Basophils Absolute: 51 cells/uL (ref 0–200)
Basophils Relative: 0.8 %
Eosinophils Absolute: 301 cells/uL (ref 15–500)
Eosinophils Relative: 4.7 %
HCT: 28.2 % — ABNORMAL LOW (ref 38.5–50.0)
Hemoglobin: 9 g/dL — ABNORMAL LOW (ref 13.2–17.1)
Lymphs Abs: 838 cells/uL — ABNORMAL LOW (ref 850–3900)
MCH: 27.6 pg (ref 27.0–33.0)
MCHC: 31.9 g/dL — ABNORMAL LOW (ref 32.0–36.0)
MCV: 86.5 fL (ref 80.0–100.0)
MPV: 8.4 fL (ref 7.5–12.5)
Monocytes Relative: 10.3 %
Neutro Abs: 4550 cells/uL (ref 1500–7800)
Neutrophils Relative %: 71.1 %
Platelets: 322 10*3/uL (ref 140–400)
RBC: 3.26 10*6/uL — ABNORMAL LOW (ref 4.20–5.80)
RDW: 14.7 % (ref 11.0–15.0)
Total Lymphocyte: 13.1 %
WBC: 6.4 10*3/uL (ref 3.8–10.8)

## 2022-05-04 LAB — HEPATITIS B CORE ANTIBODY, IGM: Hep B C IgM: NONREACTIVE

## 2022-05-04 LAB — QUANTIFERON-TB GOLD PLUS
Mitogen-NIL: 0.71 IU/mL
NIL: 0.01 IU/mL
QuantiFERON-TB Gold Plus: NEGATIVE
TB1-NIL: 0 IU/mL
TB2-NIL: 0 IU/mL

## 2022-05-04 LAB — CYCLIC CITRUL PEPTIDE ANTIBODY, IGG: Cyclic Citrullin Peptide Ab: <16 UNITS

## 2022-05-04 LAB — HEPATITIS C ANTIBODY: Hepatitis C Ab: NONREACTIVE

## 2022-05-04 LAB — HEPATITIS B SURFACE ANTIGEN: Hepatitis B Surface Ag: NONREACTIVE

## 2022-05-04 LAB — SEDIMENTATION RATE: Sed Rate: 31 mm/h — ABNORMAL HIGH (ref 0–20)

## 2022-05-04 LAB — MUTATED CITRULLINATED VIMENTIN (MCV) ANTIBODY: MUTATED CITRULLINATED VIMENTIN (MCV) AB: <20 U/mL (ref <20)

## 2022-05-16 MED ORDER — LIDOCAINE HCL 1 % IJ SOLN
3.0000 mL | INTRAMUSCULAR | Status: AC | PRN
  Administered 2022-05-01: 3 mL

## 2022-05-20 NOTE — Progress Notes (Signed)
Office Visit Note  Patient: Alex Morrison             Date of Birth: 07-02-1970           MRN: [id number]             PCP: Gaspar Garbe, MD Referring: Gaspar Garbe, MD Visit Date: 05/21/2022   Subjective:  Follow-up   History of Present Illness: Alex Morrison is a 52 y.o. male here for follow up for inflammatory arthritis suspected PsA vs seronegative RA with left knee aspiration and injection last visit. Knee remains partially improved although redeveloping swelling quickly. He has continued joint pain and stiffness in both hands. Right shoulder is worst problem today. Labs at initial visit were consistent with ESRD, negative for hepatitis or TB screening, and synovial fluid with 40981 WBCs and no crystals.  Previous HPI 05/01/22 Alex Morrison is a 52 y.o. male here for evaluation of joint pain and swelling at multiple sites with considerable symptoms ongoing for the past 2 to 3 months.  His worst complaint area has mostly been with bilateral hand pain and swelling.  Also getting some symptoms in the left knee with associated swelling.  His right knee has been consistently swollen and with limited range of motion ever since he had joint replacement surgery last year.  He avoids taking any oral prednisone as previous treatment because severely uncontrolled diabetes and so he avoids and refuses taking steroids.  Currently using Tylenol and Aleve as needed which are partially helpful.  He does have end-stage renal disease on hemodialysis secondary to hypertension and diabetes.  He also has a history of skin psoriasis but never previously diagnosed with associated inflammatory arthritis.  He was never on systemic medical treatment for this either due to low symptom severity.   Labs reviewed ESR 80 CRP 71   Review of Systems  Constitutional:  Positive for fatigue.  HENT:  Negative for mouth sores and mouth dryness.   Eyes:  Negative for dryness.  Respiratory:  Negative for  shortness of breath.   Cardiovascular:  Negative for chest pain and palpitations.  Gastrointestinal:  Negative for blood in stool, constipation and diarrhea.  Endocrine: Negative for increased urination.  Genitourinary:  Negative for involuntary urination.  Musculoskeletal:  Positive for joint pain, joint pain, joint swelling, myalgias, muscle weakness, morning stiffness, muscle tenderness and myalgias. Negative for gait problem.  Skin:  Negative for color change, rash, hair loss and sensitivity to sunlight.  Allergic/Immunologic: Negative for susceptible to infections.  Neurological:  Negative for dizziness and headaches.  Hematological:  Negative for swollen glands.  Psychiatric/Behavioral:  Positive for sleep disturbance. Negative for depressed mood. The patient is not nervous/anxious.     PMFS History:  Patient Active Problem List   Diagnosis Date Noted   Psoriatic arthritis (HCC) 05/21/2022   Sedimentation rate elevation 05/01/2022   Arthrosis of knee 08/08/2021   S/P total knee replacement, right 08/08/2021   Tibial plateau fracture 03/06/2019   ESRD (end stage renal disease) (HCC) 08/08/2018   HTN (hypertension) 08/08/2018   SBO (small bowel obstruction) (HCC) 08/08/2018   Periumbilical hernia 08/08/2018   Abdominal hernia as complication of peritoneal dialysis 08/08/2018   Psoriasis 09/29/2013   Acute gangrenous appendicitis with perforation and peritonitis 09/27/2013   Type 1 diabetes mellitus (HCC) 09/27/2013   Acute kidney injury (HCC) 09/27/2013   DM (diabetes mellitus) type 2, uncontrolled, with ketoacidosis (HCC) 07/17/2012   DKA (diabetic ketoacidosis) (HCC) 07/16/2012  Perirectal abscess 07/14/2012    Past Medical History:  Diagnosis Date   Abscess of buttock    Acute kidney injury (HCC) 09/27/2013   Depression    Diabetes mellitus without complication (HCC)    Diabetic neuropathy (HCC)    Dialysis patient (HCC)    M,W,F   ESRD (end stage renal disease)  (HCC)    Glaucoma    HTN (hypertension) 08/08/2018   Obesity    Perforated appendix    Peritonitis   Psoriasis    Retinopathy     Family History  Problem Relation Age of Onset   Diabetes Mellitus II Mother    Heart disease Mother    Diabetes Mother    Diabetes Mellitus I Father    Diabetes Father    Diabetes Mellitus II Brother    Colon cancer Neg Hx    Esophageal cancer Neg Hx    Pancreatic cancer Neg Hx    Stomach cancer Neg Hx    Past Surgical History:  Procedure Laterality Date   ABSCESS      RECTAL    APPENDECTOMY     AV FISTULA PLACEMENT Left 01/13/2019   Procedure: LEFT ARM ARTERIOVENOUS (AV) FISTULA CREATION;  Surgeon: Sherren Kerns, MD;  Location: [facility] OR;  Service: Vascular;  Laterality: Left;   CATARACT EXTRACTION     Ex lap with ileocecectomy  09/27/2013   Dr. Luisa Hart   HARDWARE REMOVAL Right 06/13/2021   Procedure: HARDWARE REMOVAL;  Surgeon: Teryl Lucy, MD;  Location: [facility] OR;  Service: Orthopedics;  Laterality: Right;   ILEOCECETOMY     INCISION AND DRAINAGE     INCISIONAL HERNIA REPAIR N/A 08/11/2018   Procedure: OPEN RETRORECTUS REPAIR OF INCARCERATED INCISIONAL HERNIA WITH BILATERAL POSTERIOR COMPONENT SEPARATION;  Surgeon: Gaynelle Adu, MD;  Location: [facility] OR;  Service: General;  Laterality: N/A;   INSERTION OF MESH  08/11/2018   Procedure: INSERTION OF PHASIX MESH;  Surgeon: Gaynelle Adu, MD;  Location: [facility] OR;  Service: General;;   insertion of PD catheter  12/2017   IR FLUORO GUIDE CV LINE RIGHT  08/08/2018   IR US GUIDE VASC ACCESS RIGHT  08/08/2018   LAPAROSCOPIC ABDOMINAL EXPLORATION N/A 09/27/2013   LAPAROSCOPIC ASSISTED VENTRAL HERNIA REPAIR  2019   LAPAROSCOPY N/A 09/27/2013   Procedure: LAPAROSCOPY DIAGNOSTIC;  Surgeon: Harriette Bouillon, MD;  Location: [facility] OR;  Service: General;  Laterality: N/A;   LAPAROTOMY  08/11/2018   Procedure: EXPLORATORY LAPAROTOMY;  Surgeon: Gaynelle Adu, MD;  Location: [facility] OR;  Service: General;;   LYSIS OF ADHESION   08/11/2018   Procedure: LYSIS OF ADHESIONS X 30 MINUTES;  Surgeon: Gaynelle Adu, MD;  Location: [facility] OR;  Service: General;;   MINOR REMOVAL OF PERITONEAL DIALYSIS CATHETER N/A 01/06/2019   Procedure: REMOVAL OF PERITONEAL DIALYSIS CATHETER;  Surgeon: Sheliah Hatch De Blanch, MD;  Location: [facility] ORS;  Service: General;  Laterality: N/A;   OPEN REDUCTION INTERNAL FIXATION (ORIF) TIBIA/FIBULA FRACTURE Right 03/07/2019   Procedure: OPEN REDUCTION INTERNAL FIXATION (ORIF) FIBULA FRACTURE;  Surgeon: Teryl Lucy, MD;  Location: [facility] OR;  Service: Orthopedics;  Laterality: Right;   ORIF TIBIA PLATEAU Right 03/07/2019   Procedure: Open Reduction Internal Fixation (Orif) Tibial Plateau;  Surgeon: Teryl Lucy, MD;  Location: [facility] OR;  Service: Orthopedics;  Laterality: Right;   SHOULDER SURGERY Left 1989   TOTAL KNEE ARTHROPLASTY Right 08/08/2021   Procedure: TOTAL KNEE ARTHROPLASTY;  Surgeon: Teryl Lucy, MD;  Location: [facility] OR;  Service: Orthopedics;  Laterality: Right;  Social History   Social History Narrative   Not on file   Immunization History  Administered Date(s) Administered   Hepatitis B, ADULT 12/25/2017, 02/20/2018, 03/21/2018, 06/24/2018, 09/03/2018, 10/01/2018, 11/05/2018, 03/04/2019   Influenza,inj,Quad PF,6+ Mos 10/15/2018, 10/16/2019   Influenza,inj,quad, With Preservative 12/24/2017   Influenza-Unspecified 10/15/2018   PFIZER(Purple Top)SARS-COV-2 Vaccination 09/11/2019   Pneumococcal Conjugate-13 12/25/2017   Pneumococcal Polysaccharide-23 09/30/2013, 12/12/2018     Objective: Vital Signs: BP (!) 160/74 (BP Location: Right Arm, Patient Position: Sitting, Cuff Size: Normal)   Pulse 69   Resp 16   Ht 5\' 11"  (1.803 m)   Wt 209 lb (94.8 kg)   BMI 29.15 kg/m    Physical Exam Cardiovascular:     Rate and Rhythm: Normal rate and regular rhythm.     Comments: LUE AVF thrill Pulmonary:     Effort: Pulmonary effort is normal.     Breath sounds: Normal breath sounds.  Skin:     General: Skin is warm and dry.     Findings: Rash present.  Neurological:     Mental Status: He is alert.  Psychiatric:        Mood and Affect: Mood normal.      Musculoskeletal Exam:  Right shoulder very painful with external rotation and with overhead abduction, lateral tenderness to pressure no palpable effusion Elbows full ROM no tenderness or swelling Wrists full ROM no tenderness or swelling Fingers mild swelling present and tenderness both proximal and distal joints Knees bilateral restricted ROM, right knee chronic thickening left knee small effusion present   Investigation: No additional findings.  Imaging: No results found.  Recent Labs: Lab Results  Component Value Date   WBC 6.4 05/01/2022   HGB 9.0 (L) 05/01/2022   PLT 322 05/01/2022   NA 139 05/01/2022   K 4.8 05/01/2022   CL 94 (L) 05/01/2022   CO2 32 05/01/2022   GLUCOSE 144 (H) 05/01/2022   BUN 45 (H) 05/01/2022   CREATININE 7.10 (H) 05/01/2022   BILITOT 0.5 05/01/2022   ALKPHOS 108 07/20/2020   AST 20 05/01/2022   ALT 14 05/01/2022   PROT 6.3 05/01/2022   ALBUMIN 3.8 07/20/2020   CALCIUM 9.1 05/01/2022   GFRAA 6 (L) 03/13/2019   QFTBGOLDPLUS NEGATIVE 05/01/2022    Speciality Comments: Only Check Blood Pressure in Right arm.   Procedures:  Large Joint Inj: R subacromial bursa on 05/21/2022 3:55 PM Indications: pain Details: 27 G 1.5 in needle, lateral approach Medications: 2 mL lidocaine 1 %; 40 mg triamcinolone acetonide 40 MG/ML Outcome: tolerated well, no immediate complications Procedure, treatment alternatives, risks and benefits explained, specific risks discussed. Consent was given by the patient. Immediately prior to procedure a time out was called to verify the correct patient, procedure, equipment, support staff and site/side marked as required. Patient was prepped and draped in the usual sterile fashion.     Allergies: Patient has no known allergies.   Assessment / Plan:      Visit Diagnoses: Psoriatic arthritis (HCC) - Plan: Large Joint Inj: R subacromial bursa  Definite inflammatory arthritis based on synovial fluid analysis and pain and swelling out of proportion to degenerative changes on xray. Discussed treatment options are limited with renal disease and not recommending prolonged steroid use. Plan to start Enbrel 50 mg Gross weekly. Discussed risks of medication including injection reactions, infections, and malignancy. He does not have a history of heart failure, sometimes volume status fluctuation due to dialysis. Steroid injection to right subacromial bursa today  for current pain at that site.  Psoriasis  Total skin disease activity is mild primarily arthritis complaints as above. Not requiring additional topical treatments consistently. Expect symptoms to be well controlled with addition of DMARD.  ESRD (end stage renal disease) (HCC)  Treatment options limited by ESRD status so recommending biologic DMARD as above.  Orders: Orders Placed This Encounter  Procedures   Large Joint Inj: R subacromial bursa   No orders of the defined types were placed in this encounter.    Follow-Up Instructions: Return in about 2 months (around 07/21/2022) for PsA Enbrel start f/u 2mos.   Fuller Plan, MD  Note - This record has been created using [employer].  Chart creation errors have been sought, but may not always  have been located. Such creation errors do not reflect on  the standard of medical care.

## 2022-05-21 ENCOUNTER — Encounter: Payer: Self-pay | Admitting: Internal Medicine

## 2022-05-21 ENCOUNTER — Ambulatory Visit: Payer: Medicare Other | Attending: Internal Medicine | Admitting: Internal Medicine

## 2022-05-21 VITALS — BP 160/74 | HR 69 | Resp 16 | Ht 71.0 in | Wt 209.0 lb

## 2022-05-21 DIAGNOSIS — L409 Psoriasis, unspecified: Secondary | ICD-10-CM

## 2022-05-21 DIAGNOSIS — N186 End stage renal disease: Secondary | ICD-10-CM | POA: Diagnosis present

## 2022-05-21 DIAGNOSIS — L4050 Arthropathic psoriasis, unspecified: Secondary | ICD-10-CM | POA: Diagnosis present

## 2022-05-21 DIAGNOSIS — M0600 Rheumatoid arthritis without rheumatoid factor, unspecified site: Secondary | ICD-10-CM | POA: Insufficient documentation

## 2022-05-21 NOTE — Patient Instructions (Signed)
Etanercept Injection What is this medication? ETANERCEPT (et a NER sept) treats autoimmune conditions, such as psoriasis and certain types of arthritis. It works by slowing down an overactive immune system. It belongs to a group of medications called TNF inhibitors. This medicine may be used for other purposes; ask your health care provider or pharmacist if you have questions. COMMON BRAND NAME(S): Enbrel What should I tell my care team before I take this medication? They need to know if you have any of these conditions: Bleeding disorder Cancer Diabetes Granulomatosis with polyangiitis Heart failure HIV or AIDS Immune system problems Infection, such as tuberculosis (TB) or other bacterial, fungal or viral infections Liver disease Nervous system problems, such as Guillain-Barre syndrome, multiple sclerosis or seizures Recent or upcoming vaccine An unusual or allergic reaction to etanercept, other medications, food, dyes, or preservatives Pregnant or trying to get pregnant Breastfeeding How should I use this medication? The medication is injected under the skin. You will be taught how to prepare and give it. Take it as directed on the prescription label. Keep taking it unless your care team tells you stop. This medication comes with INSTRUCTIONS FOR USE. Ask your pharmacist for directions on how to use this medication. Read the information carefully. Talk to your pharmacist or care team if you have questions. If you use a pen, be sure to take off the outer needle cover before using the dose. It is important that you put your used needles and syringes in a special sharps container. Do not put them in a trash can. If you do not have a sharps container, call your pharmacist or care team to get one. A special MedGuide will be given to you by the pharmacist with each prescription and refill. Be sure to read this information carefully each time. Talk to your care team about the use of this  medication in children. While it may be prescribed for children as young as 2 years of age for selected conditions, precautions do apply. Overdosage: If you think you have taken too much of this medicine contact a poison control center or emergency room at once. NOTE: This medicine is only for you. Do not share this medicine with others. What if I miss a dose? If you miss a dose, take it as soon as you can. If it is almost time for your next dose, take only that dose. Do not take double or extra doses. What may interact with this medication? Do not take this medication with any of the following: Biologic medications, such as adalimumab, certolizumab, golimumab, infliximab Live vaccines Rilonacept This medication may also interact with the following: Abatacept [place] Biologic medications, such as anifrolumab, baricitinib, belimumab, canakinumab, natalizumab, rituximab, sarilumab, tocilizumab, tofacitinib, upadacitinib, vedolizumab Cyclophosphamide Sulfasalazine This list may not describe all possible interactions. Give your health care provider a list of all the medicines, herbs, non-prescription drugs, or dietary supplements you use. Also tell them if you smoke, drink alcohol, or use illegal drugs. Some items may interact with your medicine. What should I watch for while using this medication? Visit your care team for regular checks on your progress. Tell your care team if your symptoms do not start to get better or if they get worse. This medication may increase your risk of getting an infection. Call your care team for advice if you get a fever, chills, sore throat, or other symptoms of a cold or flu. Do not treat yourself. Try to avoid being around people who are sick. If   you have not had the measles or chickenpox vaccines, tell your care team right away if you are around someone with these viruses. You will be tested for tuberculosis (TB) before you start this medication. If your care team  prescribes any medication for TB, you should start taking the TB medication before starting this medication. Make sure to finish the full course of TB medication. Avoid taking medications that contain aspirin, acetaminophen, ibuprofen, naproxen, or ketoprofen unless instructed by your care team. These medications may hide fever. Talk to your care team about your risk of cancer. You may be more at risk for certain types of cancer if you take this medication. This medication can decrease the response to a vaccine. If you need to get vaccinated, tell your care team if you have received this medication. Extra booster doses may be needed. Talk to your care team to see if a different vaccination schedule is needed. What side effects may I notice from receiving this medication? Side effects that you should report to your care team as soon as possible: Allergic reactions--skin rash, itching, hives, swelling of the face, lips, tongue, or throat Body pain, tingling, or numbness Eye pain, change in vision, vision loss Heart failure--shortness of breath, swelling of the ankles, feet, or hands, sudden weight gain, unusual weakness or fatigue Infection--fever, chills, cough, sore throat, wounds that don't heal, pain or trouble when passing urine, general feeling of discomfort or being unwell Liver injury--right upper belly pain, loss of appetite, nausea, light-colored stool, dark yellow or brown urine, yellowing skin or eyes, unusual weakness or fatigue Low red blood cell level--unusual weakness or fatigue, dizziness, headache, trouble breathing Lupus-like syndrome--joint pain, swelling, or stiffness, butterfly-shaped rash on the face, rashes that get worse in the sun, fever, unusual weakness or fatigue New or worsening psoriasis--rash with itchy, scaly patches Seizures Unusual bruising or bleeding Weakness in arms and legs Side effects that usually do not require medical attention (report to your care team if  they continue or are bothersome): Headache Pain, redness, or irritation at injection site Sinus pain or pressure around the face or forehead This list may not describe all possible side effects. Call your doctor for medical advice about side effects. You may report side effects to FDA at 1-800-FDA-1088. Where should I keep my medication? Keep out of the reach of children and pets. See product for storage information. Each product may have different instructions. Get rid of any unused medication after the expiration date. To get rid of medications that are no longer needed or have expired: Take the medication to a medication take-back program. Check with your pharmacy or law enforcement to find a location. If you cannot return the medication, ask your pharmacist or care team how to get rid of this medication safely. NOTE: This sheet is a summary. It may not cover all possible information. If you have questions about this medicine, talk to your doctor, pharmacist, or health care provider.  2023 Elsevier/Gold Standard (2021-06-28 00:00:00)  

## 2022-05-22 ENCOUNTER — Telehealth: Payer: Self-pay | Admitting: Pharmacist

## 2022-05-22 DIAGNOSIS — L409 Psoriasis, unspecified: Secondary | ICD-10-CM

## 2022-05-22 DIAGNOSIS — Z79899 Other long term (current) drug therapy: Secondary | ICD-10-CM

## 2022-05-22 DIAGNOSIS — L4050 Arthropathic psoriasis, unspecified: Secondary | ICD-10-CM

## 2022-05-22 NOTE — Telephone Encounter (Addendum)
SAVED a Prior Authorization request to [facility] for ENBREL via CoverMyMeds. Submission pending OV note to be signed  Key: [id number]   Dose: 50mg  every 7 days  Dx: Psoriatic arthritis (L40.5) and Psoriasis (L40.9)  Therapies patient unable to try: Steroids - uncontrolled diabetes MTX and leflunomide - on hemodialysis  Chesley Mires, PharmD, MPH, BCPS, CPP Clinical Pharmacist (Rheumatology and Pulmonology)  ----- Message from Audrie Lia, RT sent at 05/21/2022  3:44 PM EDT ----- Regarding: NEW START ENBREL

## 2022-06-04 MED ORDER — LIDOCAINE HCL 1 % IJ SOLN
2.0000 mL | INTRAMUSCULAR | Status: AC | PRN
  Administered 2022-05-21: 2 mL

## 2022-06-04 MED ORDER — TRIAMCINOLONE ACETONIDE 40 MG/ML IJ SUSP
40.0000 mg | INTRAMUSCULAR | Status: AC | PRN
  Administered 2022-05-21: 40 mg via INTRA_ARTICULAR

## 2022-06-04 NOTE — Telephone Encounter (Signed)
Note is signed, thanks.

## 2022-06-04 NOTE — Telephone Encounter (Addendum)
PA submitted to [facility] for Enbrel.  Key: [id number]  Chesley Mires, PharmD, MPH, BCPS, CPP Clinical Pharmacist (Rheumatology and Pulmonology)

## 2022-06-08 NOTE — Telephone Encounter (Signed)
Received a fax regarding Prior Authorization from [facility] for ENBREL. Authorization has been DENIED because patient must meet following criteria: - have severe PsA - high lab tests or deformaties of joints - also have severe psoriasis - have tried standard DMARDs for at least 3 months and it did not work or was not tolerated or have a medically reason why they cannot take try all of the above meds (cyclosporine, leflunomide, MTX, SSZ)  Will work on appeal  Chesley Mires, PharmD, MPH, BCPS, CPP Clinical Pharmacist (Rheumatology and Pulmonology)

## 2022-06-14 NOTE — Telephone Encounter (Signed)
Submitted an URGENT appeal to [facility] for ENBREL.  Reference # [id number] Phone: [phone] Fax: [phone]

## 2022-06-15 ENCOUNTER — Telehealth: Payer: Self-pay | Admitting: *Deleted

## 2022-06-15 NOTE — Telephone Encounter (Signed)
Appeal for Enbrel has been submitted. Left VM advising patient that we will be in touch once we have an update  Chesley Mires, PharmD, MPH, BCPS, CPP Clinical Pharmacist (Rheumatology and Pulmonology)

## 2022-06-15 NOTE — Telephone Encounter (Signed)
Patient received letter stating Enbrel was denied. What's the next step? Please call patient. Thank you.

## 2022-06-27 NOTE — Telephone Encounter (Signed)
ATC patient about appeal status, left detailed VM about current status. Will f/u with patient once we hear back from [facility] about final decision.

## 2022-06-27 NOTE — Telephone Encounter (Signed)
Contacted [employer] about appeal status, which was downgraded to standard appeal and there are still 15 calendar days left until we will hear a response.  Appeal ID: [id number] Phone: [phone]

## 2022-07-12 ENCOUNTER — Encounter: Payer: BC Managed Care – PPO | Admitting: Internal Medicine

## 2022-07-12 NOTE — Telephone Encounter (Signed)
Contacted [employer] about appeal status, which was downgraded to standard appeal and there are still 3 more days left until we will hear a response.   Appeal ID: [id number] Phone: [phone]   Darolyn Rua, PharmD Student [employer]

## 2022-07-23 ENCOUNTER — Ambulatory Visit: Payer: BC Managed Care – PPO | Admitting: Internal Medicine

## 2022-07-23 NOTE — Progress Notes (Deleted)
Office Visit Note  Patient: Alex Morrison             Date of Birth: 1970/02/09           MRN: [id number]             PCP: Gaspar Garbe, MD Referring: Gaspar Garbe, MD Visit Date: 07/23/2022   Subjective:  No chief complaint on file.   History of Present Illness: Alex Morrison is a 52 y.o. male here for follow up ***   Previous HPI 05/21/22 Alex Morrison is a 52 y.o. male here for follow up for inflammatory arthritis suspected PsA vs seronegative RA with left knee aspiration and injection last visit. Knee remains partially improved although redeveloping swelling quickly. He has continued joint pain and stiffness in both hands. Right shoulder is worst problem today. Labs at initial visit were consistent with ESRD, negative for hepatitis or TB screening, and synovial fluid with 40981 WBCs and no crystals.   Previous HPI 05/01/22 Alex Morrison is a 52 y.o. male here for evaluation of joint pain and swelling at multiple sites with considerable symptoms ongoing for the past 2 to 3 months.  His worst complaint area has mostly been with bilateral hand pain and swelling.  Also getting some symptoms in the left knee with associated swelling.  His right knee has been consistently swollen and with limited range of motion ever since he had joint replacement surgery last year.  He avoids taking any oral prednisone as previous treatment because severely uncontrolled diabetes and so he avoids and refuses taking steroids.  Currently using Tylenol and Aleve as needed which are partially helpful.  He does have end-stage renal disease on hemodialysis secondary to hypertension and diabetes.  He also has a history of skin psoriasis but never previously diagnosed with associated inflammatory arthritis.  He was never on systemic medical treatment for this either due to low symptom severity.   Labs reviewed ESR 80 CRP 71   No Rheumatology ROS completed.   PMFS History:  Patient Active  Problem List   Diagnosis Date Noted   Psoriatic arthritis (HCC) 05/21/2022   Sedimentation rate elevation 05/01/2022   Arthrosis of knee 08/08/2021   S/P total knee replacement, right 08/08/2021   Tibial plateau fracture 03/06/2019   ESRD (end stage renal disease) (HCC) 08/08/2018   HTN (hypertension) 08/08/2018   SBO (small bowel obstruction) (HCC) 08/08/2018   Periumbilical hernia 08/08/2018   Abdominal hernia as complication of peritoneal dialysis 08/08/2018   Psoriasis 09/29/2013   Acute gangrenous appendicitis with perforation and peritonitis 09/27/2013   Type 1 diabetes mellitus (HCC) 09/27/2013   Acute kidney injury (HCC) 09/27/2013   DM (diabetes mellitus) type 2, uncontrolled, with ketoacidosis (HCC) 07/17/2012   DKA (diabetic ketoacidosis) (HCC) 07/16/2012   Perirectal abscess 07/14/2012    Past Medical History:  Diagnosis Date   Abscess of buttock    Acute kidney injury (HCC) 09/27/2013   Depression    Diabetes mellitus without complication (HCC)    Diabetic neuropathy (HCC)    Dialysis patient (HCC)    M,W,F   ESRD (end stage renal disease) (HCC)    Glaucoma    HTN (hypertension) 08/08/2018   Obesity    Perforated appendix    Peritonitis   Psoriasis    Retinopathy     Family History  Problem Relation Age of Onset   Diabetes Mellitus II Mother    Heart disease Mother    Diabetes  Mother    Diabetes Mellitus I Father    Diabetes Father    Diabetes Mellitus II Brother    Colon cancer Neg Hx    Esophageal cancer Neg Hx    Pancreatic cancer Neg Hx    Stomach cancer Neg Hx    Past Surgical History:  Procedure Laterality Date   ABSCESS      RECTAL    APPENDECTOMY     AV FISTULA PLACEMENT Left 01/13/2019   Procedure: LEFT ARM ARTERIOVENOUS (AV) FISTULA CREATION;  Surgeon: Sherren Kerns, MD;  Location: [facility] OR;  Service: Vascular;  Laterality: Left;   CATARACT EXTRACTION     Ex lap with ileocecectomy  09/27/2013   Dr. Luisa Hart   HARDWARE REMOVAL Right  06/13/2021   Procedure: HARDWARE REMOVAL;  Surgeon: Teryl Lucy, MD;  Location: [facility] OR;  Service: Orthopedics;  Laterality: Right;   ILEOCECETOMY     INCISION AND DRAINAGE     INCISIONAL HERNIA REPAIR N/A 08/11/2018   Procedure: OPEN RETRORECTUS REPAIR OF INCARCERATED INCISIONAL HERNIA WITH BILATERAL POSTERIOR COMPONENT SEPARATION;  Surgeon: Gaynelle Adu, MD;  Location: [facility] OR;  Service: General;  Laterality: N/A;   INSERTION OF MESH  08/11/2018   Procedure: INSERTION OF PHASIX MESH;  Surgeon: Gaynelle Adu, MD;  Location: [facility] OR;  Service: General;;   insertion of PD catheter  12/2017   IR FLUORO GUIDE CV LINE RIGHT  08/08/2018   IR US GUIDE VASC ACCESS RIGHT  08/08/2018   LAPAROSCOPIC ABDOMINAL EXPLORATION N/A 09/27/2013   LAPAROSCOPIC ASSISTED VENTRAL HERNIA REPAIR  2019   LAPAROSCOPY N/A 09/27/2013   Procedure: LAPAROSCOPY DIAGNOSTIC;  Surgeon: Harriette Bouillon, MD;  Location: [facility] OR;  Service: General;  Laterality: N/A;   LAPAROTOMY  08/11/2018   Procedure: EXPLORATORY LAPAROTOMY;  Surgeon: Gaynelle Adu, MD;  Location: [facility] OR;  Service: General;;   LYSIS OF ADHESION  08/11/2018   Procedure: LYSIS OF ADHESIONS X 30 MINUTES;  Surgeon: Gaynelle Adu, MD;  Location: [facility] OR;  Service: General;;   MINOR REMOVAL OF PERITONEAL DIALYSIS CATHETER N/A 01/06/2019   Procedure: REMOVAL OF PERITONEAL DIALYSIS CATHETER;  Surgeon: Sheliah Hatch De Blanch, MD;  Location: [facility] ORS;  Service: General;  Laterality: N/A;   OPEN REDUCTION INTERNAL FIXATION (ORIF) TIBIA/FIBULA FRACTURE Right 03/07/2019   Procedure: OPEN REDUCTION INTERNAL FIXATION (ORIF) FIBULA FRACTURE;  Surgeon: Teryl Lucy, MD;  Location: [facility] OR;  Service: Orthopedics;  Laterality: Right;   ORIF TIBIA PLATEAU Right 03/07/2019   Procedure: Open Reduction Internal Fixation (Orif) Tibial Plateau;  Surgeon: Teryl Lucy, MD;  Location: [facility] OR;  Service: Orthopedics;  Laterality: Right;   SHOULDER SURGERY Left 1989   TOTAL KNEE ARTHROPLASTY Right  08/08/2021   Procedure: TOTAL KNEE ARTHROPLASTY;  Surgeon: Teryl Lucy, MD;  Location: [facility] OR;  Service: Orthopedics;  Laterality: Right;   Social History   Social History Narrative   Not on file   Immunization History  Administered Date(s) Administered   Hepatitis B, ADULT 12/25/2017, 02/20/2018, 03/21/2018, 06/24/2018, 09/03/2018, 10/01/2018, 11/05/2018, 03/04/2019   Influenza,inj,Quad PF,6+ Mos 10/15/2018, 10/16/2019   Influenza,inj,quad, With Preservative 12/24/2017   Influenza-Unspecified 10/15/2018   PFIZER(Purple Top)SARS-COV-2 Vaccination 09/11/2019   Pneumococcal Conjugate-13 12/25/2017   Pneumococcal Polysaccharide-23 09/30/2013, 12/12/2018     Objective: Vital Signs: There were no vitals taken for this visit.   Physical Exam   Musculoskeletal Exam: ***  CDAI Exam: CDAI Score: -- Patient Global: --; Provider Global: -- Swollen: --; Tender: -- Joint Exam 07/23/2022   No joint exam has been  documented for this visit   There is currently no information documented on the homunculus. Go to the Rheumatology activity and complete the homunculus joint exam.  Investigation: No additional findings.  Imaging: No results found.  Recent Labs: Lab Results  Component Value Date   WBC 6.4 05/01/2022   HGB 9.0 (L) 05/01/2022   PLT 322 05/01/2022   NA 139 05/01/2022   K 4.8 05/01/2022   CL 94 (L) 05/01/2022   CO2 32 05/01/2022   GLUCOSE 144 (H) 05/01/2022   BUN 45 (H) 05/01/2022   CREATININE 7.10 (H) 05/01/2022   BILITOT 0.5 05/01/2022   ALKPHOS 108 07/20/2020   AST 20 05/01/2022   ALT 14 05/01/2022   PROT 6.3 05/01/2022   ALBUMIN 3.8 07/20/2020   CALCIUM 9.1 05/01/2022   GFRAA 6 (L) 03/13/2019   QFTBGOLDPLUS NEGATIVE 05/01/2022    Speciality Comments: Only Check Blood Pressure in Right arm.   Procedures:  No procedures performed Allergies: Patient has no known allergies.   Assessment / Plan:     Visit Diagnoses: No diagnosis  found.  ***  Orders: No orders of the defined types were placed in this encounter.  No orders of the defined types were placed in this encounter.    Follow-Up Instructions: No follow-ups on file.   Fuller Plan, MD  Note - This record has been created using [employer].  Chart creation errors have been sought, but may not always  have been located. Such creation errors do not reflect on  the standard of medical care.

## 2022-07-24 ENCOUNTER — Encounter (HOSPITAL_COMMUNITY): Payer: Self-pay

## 2022-07-24 ENCOUNTER — Other Ambulatory Visit (HOSPITAL_COMMUNITY): Payer: Self-pay

## 2022-07-24 NOTE — Telephone Encounter (Signed)
Received notification from [facility] regarding a prior authorization for ENBREL. Authorization has been APPROVED from 07/13/22 to 07/13/23. Approval letter sent to scan center.  Per test claim, copay for 28 days supply is $0  Patient can fill through [facility]: [phone]   Appeal ID # [id number]  Chesley Mires, PharmD, MPH, BCPS, CPP Clinical Pharmacist (Rheumatology and Pulmonology)

## 2022-07-25 ENCOUNTER — Other Ambulatory Visit: Payer: Self-pay

## 2022-07-25 ENCOUNTER — Other Ambulatory Visit (HOSPITAL_COMMUNITY): Payer: Self-pay

## 2022-07-25 MED ORDER — ENBREL MINI 50 MG/ML ~~LOC~~ SOCT
50.0000 mg | SUBCUTANEOUS | 0 refills | Status: DC
  Filled 2022-07-25 (×2): qty 4, 28d supply, fill #0

## 2022-07-25 NOTE — Telephone Encounter (Signed)
Delivery instructions have been updated in Alex Morrison, medication will be couriered to [facility] on 07/30/22.  Rx has been processed in [facility] and the patient has no copay at this time.

## 2022-07-25 NOTE — Telephone Encounter (Signed)
ATC patient to schedule Enbrel new start. Unable to reach. Left VM requesting return call.   Rx sent to [facility] to be couriered to clinic. If patient returns call, the earliest his appt can be scheduled is 08/01/22 as med has to be shipped from pharmacy.  Routing to Tennyson M to have med shipment couriered to clinic  Chesley Mires, PharmD, MPH, BCPS, CPP Clinical Pharmacist (Rheumatology and Pulmonology)

## 2022-07-26 ENCOUNTER — Other Ambulatory Visit (HOSPITAL_COMMUNITY): Payer: Self-pay

## 2022-07-26 NOTE — Telephone Encounter (Signed)
ATC patient to schedule Enbrel new start. Unable to reach. Left VM requesting return call.  If patient returns call, the earliest his appt can be scheduled is 08/01/22 as med has to be shipped from pharmacy.

## 2022-07-30 NOTE — Telephone Encounter (Signed)
Received Enbrel from [employer]. Placed in fridge  Chesley Mires, PharmD, MPH, BCPS, CPP Clinical Pharmacist (Rheumatology and Pulmonology)

## 2022-07-30 NOTE — Telephone Encounter (Signed)
Patient scheduled for Enbrel new start on 08/07/22  Chesley Mires, PharmD, MPH, BCPS, CPP Clinical Pharmacist (Rheumatology and Pulmonology)

## 2022-08-06 NOTE — Progress Notes (Unsigned)
Pharmacy Note  Subjective:   Patient presents to clinic today to receive first dose of Enbrel for psoriatic arthritis. Patient currently takes no medications for psoriatic arthritis.  Patient running a fever or have signs/symptoms of infection? {yes/no:20286} Patient currently on antibiotics for the treatment of infection? {yes/no:20286} Patient have any upcoming invasive procedures/surgeries? {yes/no:20286}  Objective: CMP     Component Value Date/Time   NA 139 05/01/2022 1436   K 4.8 05/01/2022 1436   CL 94 (L) 05/01/2022 1436   CO2 32 05/01/2022 1436   GLUCOSE 144 (H) 05/01/2022 1436   BUN 45 (H) 05/01/2022 1436   CREATININE 7.10 (H) 05/01/2022 1436   CALCIUM 9.1 05/01/2022 1436   PROT 6.3 05/01/2022 1436   ALBUMIN 3.8 07/20/2020 2133   AST 20 05/01/2022 1436   ALT 14 05/01/2022 1436   ALKPHOS 108 07/20/2020 2133   BILITOT 0.5 05/01/2022 1436   GFRNONAA 14 03/12/2022 0834   GFRAA 6 (L) 03/13/2019 0727    CBC    Component Value Date/Time   WBC 6.4 05/01/2022 1436   RBC 3.26 (L) 05/01/2022 1436   HGB 9.0 (L) 05/01/2022 1436   HCT 28.2 (L) 05/01/2022 1436   PLT 322 05/01/2022 1436   MCV 86.5 05/01/2022 1436   MCH 27.6 05/01/2022 1436   MCHC 31.9 (L) 05/01/2022 1436   RDW 14.7 05/01/2022 1436   LYMPHSABS 838 (L) 05/01/2022 1436   MONOABS 0.8 07/20/2020 2133   EOSABS 301 05/01/2022 1436   BASOSABS 51 05/01/2022 1436    Baseline Immunosuppressant Therapy Labs TB GOLD    Latest Ref Rng & Units 05/01/2022    2:36 PM  Quantiferon TB Gold  Quantiferon TB Gold Plus NEGATIVE NEGATIVE    Hepatitis Panel    Latest Ref Rng & Units 05/01/2022    2:36 PM  Hepatitis  Hep B Surface Ag NON-REACTIVE NON-REACTIVE   Hep B IgM NON-REACTIVE NON-REACTIVE   Hep C Ab NON-REACTIVE NON-REACTIVE    HIV Lab Results  Component Value Date   HIV Non Reactive 08/08/2018   Immunoglobulins   SPEP    Latest Ref Rng & Units 05/01/2022    2:36 PM  Serum Protein Electrophoresis   Total Protein 6.1 - 8.1 g/dL 6.3    M5HQ No results found for: "G6PDH" TPMT No results found for: "TPMT"   Chest x-ray: 07/20/20 "IMPRESSION: Small bilateral pleural effusions and peribronchial thickening, suggesting pulmonary edema."  Assessment/Plan:  Reviewed importance of holding Enbrel with signs/symptoms of an infections, if antibiotics are prescribed to treat an active infection, and with invasive procedures  Demonstrated proper injection technique with Enbrel demo device  Patient able to demonstrate proper injection technique using the teach back method.  Patient self injected in the {injsitedsg:28167} with:  Sample Medication: *** NDC: *** Lot: *** Expiration: ***  Patient tolerated well.  Observed for 30 mins in office for adverse reaction and ***.   Patient is to return in 1 month for labs and 6-8 weeks for follow-up appointment.  Standing orders for CBC/CMP placed. TB gold will be monitored yearly. Referral to *** Dermatology placed today for yearly skin checks while on TNF inhibitor due to risk for non melanoma skin cancer  Enbrel approved through insurance [employer].   Rx sent to: [facility]: [phone] .  Patient provided with pharmacy phone number and advised to call later this week to schedule shipment to home.  Patient will continue Enbrel 50mg  subcut every 7 days.  All questions  encouraged and answered.  Instructed patient to call with any further questions or concerns.  Rickey Primus, PharmD Candidate [employer] 2025   Chesley Mires, PharmD, MPH, BCPS, CPP Clinical Pharmacist (Rheumatology and Pulmonology)  08/06/2022 3:13 PM

## 2022-08-06 NOTE — Patient Instructions (Signed)
Your next ENBREL dose is due on 7/30, 8/6, and every 7 days thereafter  HOLD ENBREL if you have signs or symptoms of an infection. You can resume once you feel better or back to your baseline. HOLD ENBREL if you start antibiotics to treat an infection. HOLD ENBREL around the time of surgery/procedures. Your surgeon will be able to provide recommendations on when to hold BEFORE and when you are cleared to RESUME.  Pharmacy information: Your prescription will be shipped from [facility]  Their phone number is [phone] They will call you to schedule shipment and confirm address, so please ensure to answer their call. They will mail your medication to your home.  Cost information: Your copay should be affordable per a test claim run by the pharmacy team.  Labs are due in 1 month then every 3 months. Lab hours are from Monday to Thursday 8am-12:30pm and 1pm-5pm and Friday 8am-12pm. You do not need an appointment if you come for labs during these times.  Please ensure you have annual skin checks by dermatology while on this medication.  How to manage an injection site reaction: Remember the 5 C's: COUNTER - leave on the counter at least 30 minutes but up to overnight to bring medication to room temperature. This may help prevent stinging COLD - place something cold (like an ice gel pack or cold water bottle) on the injection site just before cleansing with alcohol. This may help reduce pain CLARITIN - use Claritin (generic name is loratadine) for the first two weeks of treatment or the day of, the day before, and the day after injecting. This will help to minimize injection site reactions CORTISONE CREAM - apply if injection site is irritated and itching CALL ME - if injection site reaction is bigger than the size of your fist, looks infected, blisters, or if you develop hives

## 2022-08-07 ENCOUNTER — Ambulatory Visit: Payer: Medicare Other | Attending: Rheumatology | Admitting: Pharmacist

## 2022-08-07 DIAGNOSIS — Z7189 Other specified counseling: Secondary | ICD-10-CM

## 2022-08-14 ENCOUNTER — Ambulatory Visit: Payer: BC Managed Care – PPO | Admitting: Pharmacist

## 2022-08-14 DIAGNOSIS — L409 Psoriasis, unspecified: Secondary | ICD-10-CM

## 2022-08-14 DIAGNOSIS — L4050 Arthropathic psoriasis, unspecified: Secondary | ICD-10-CM

## 2022-08-14 DIAGNOSIS — Z79899 Other long term (current) drug therapy: Secondary | ICD-10-CM

## 2022-08-17 ENCOUNTER — Other Ambulatory Visit (HOSPITAL_COMMUNITY): Payer: Self-pay

## 2022-08-17 NOTE — Progress Notes (Deleted)
Pharmacy Note  Subjective:   Patient presents to clinic today to receive first dose of Enbrel for PsA/Pso. Patient currently takes ***.  Patient running a fever or have signs/symptoms of infection? {yes/no:20286}  Patient currently on antibiotics for the treatment of infection? {yes/no:20286}  Patient have any upcoming invasive procedures/surgeries? {yes/no:20286}  Objective: CMP     Component Value Date/Time   NA 139 05/01/2022 1436   K 4.8 05/01/2022 1436   CL 94 (L) 05/01/2022 1436   CO2 32 05/01/2022 1436   GLUCOSE 144 (H) 05/01/2022 1436   BUN 45 (H) 05/01/2022 1436   CREATININE 7.10 (H) 05/01/2022 1436   CALCIUM 9.1 05/01/2022 1436   PROT 6.3 05/01/2022 1436   ALBUMIN 3.8 07/20/2020 2133   AST 20 05/01/2022 1436   ALT 14 05/01/2022 1436   ALKPHOS 108 07/20/2020 2133   BILITOT 0.5 05/01/2022 1436   GFRNONAA 14 03/12/2022 0834   GFRAA 6 (L) 03/13/2019 0727    CBC    Component Value Date/Time   WBC 6.4 05/01/2022 1436   RBC 3.26 (L) 05/01/2022 1436   HGB 9.0 (L) 05/01/2022 1436   HCT 28.2 (L) 05/01/2022 1436   PLT 322 05/01/2022 1436   MCV 86.5 05/01/2022 1436   MCH 27.6 05/01/2022 1436   MCHC 31.9 (L) 05/01/2022 1436   RDW 14.7 05/01/2022 1436   LYMPHSABS 838 (L) 05/01/2022 1436   MONOABS 0.8 07/20/2020 2133   EOSABS 301 05/01/2022 1436   BASOSABS 51 05/01/2022 1436    Baseline Immunosuppressant Therapy Labs TB GOLD    Latest Ref Rng & Units 05/01/2022    2:36 PM  Quantiferon TB Gold  Quantiferon TB Gold Plus NEGATIVE NEGATIVE    Hepatitis Panel    Latest Ref Rng & Units 05/01/2022    2:36 PM  Hepatitis  Hep B Surface Ag NON-REACTIVE NON-REACTIVE   Hep B IgM NON-REACTIVE NON-REACTIVE   Hep C Ab NON-REACTIVE NON-REACTIVE    HIV Lab Results  Component Value Date   HIV Non Reactive 08/08/2018   Immunoglobulins   SPEP    Latest Ref Rng & Units 05/01/2022    2:36 PM  Serum Protein Electrophoresis  Total Protein 6.1 - 8.1 g/dL 6.3    Chest  x-ray: 1/61/09 - Small bilateral pleural effusions and peribronchial thickening, suggesting pulmonary edema.  Assessment/Plan:  Reviewed importance of holding Enbrel with signs/symptoms of an infections, if antibiotics are prescribed to treat an active infection, and with invasive procedures  Demonstrated proper injection technique with Enbrel demo device  Patient able to demonstrate proper injection technique using the teach back method.  Patient self injected in the {injsitedsg:28167} with:  Pharmacy-supplied Medication: Enbrel Mini 50mg /mL cartridge NDC: *** Lot: *** Expiration: ***  Sample Medication: Enbrel Autotouch Injector Device NDC: *** Lot: *** Expiration: ***  Patient tolerated well.  Observed for 30 mins in office for adverse reaction and ***.   Patient is to return in 1 month for labs and 6-8 weeks for follow-up appointment.  Standing orders for CBC/CMP placed.  TB gold will be monitored yearly.  Referral to *** Dermatology placed today for yearly skin checks while on TNF inhibitor due to risk for non melanoma skin cancer  Enbrel approved through insurance .   Rx sent to: [facility]: [phone] .  Patient provided with pharmacy phone number and advised that they will call to schedule shipment to home.  Patient will continue Enbrel 50mg  SQ subcut every 7 days as monotherapy.  All questions encouraged and answered.  Instructed patient to call with any further questions or concerns.  Chesley Mires, PharmD, MPH, BCPS, CPP Clinical Pharmacist (Rheumatology and Pulmonology)  08/17/2022 4:07 PM

## 2022-08-19 ENCOUNTER — Other Ambulatory Visit (HOSPITAL_COMMUNITY): Payer: Self-pay

## 2022-08-21 ENCOUNTER — Ambulatory Visit: Payer: BC Managed Care – PPO | Admitting: Pharmacist

## 2022-08-30 ENCOUNTER — Other Ambulatory Visit (HOSPITAL_COMMUNITY): Payer: Self-pay

## 2022-09-12 ENCOUNTER — Other Ambulatory Visit (HOSPITAL_COMMUNITY): Payer: Self-pay

## 2022-09-26 ENCOUNTER — Other Ambulatory Visit (HOSPITAL_COMMUNITY): Payer: Self-pay

## 2022-12-07 ENCOUNTER — Other Ambulatory Visit: Payer: Self-pay

## 2023-02-27 ENCOUNTER — Telehealth: Payer: Self-pay | Admitting: Emergency Medicine

## 2023-02-27 NOTE — Telephone Encounter (Signed)
  This patient has a referral to see Dr. Celine Mans on 03/04/2023 as a new consult to evaluate cough.  I saw one of his family members in the office today who asked for me to see him.  I think the best plan would be for him to keep the appointment with Dr. Celine Mans as I will certainly not be able to get him scheduled sooner than 2/17.  I think that they will be able to address his issues but if for some reason Dr. Celine Mans and the patient would like for him to see me then okay to arrange for visit w RB.   Please call the patient to explain this plan and that I would like for him to keep his existing appointment with Dr. Celine Mans on 03/04/2023 for now.  Thank you

## 2023-02-28 NOTE — Telephone Encounter (Signed)
Patient returned phone call. Patient to keep appointment with Dr. Celine Mans. NFN.

## 2023-02-28 NOTE — Telephone Encounter (Signed)
ATC X1. Lmtcb

## 2023-03-04 ENCOUNTER — Encounter: Payer: Self-pay | Admitting: Internal Medicine

## 2023-03-04 ENCOUNTER — Ambulatory Visit (INDEPENDENT_AMBULATORY_CARE_PROVIDER_SITE_OTHER): Payer: Medicare Other | Admitting: Internal Medicine

## 2023-03-04 VITALS — BP 161/77 | HR 75 | Ht 71.0 in | Wt 223.0 lb

## 2023-03-04 DIAGNOSIS — R053 Chronic cough: Secondary | ICD-10-CM

## 2023-03-04 MED ORDER — FLUTICASONE PROPIONATE 50 MCG/ACT NA SUSP: 1.0000 | Freq: Every day | NASAL | 2 refills | Status: DC

## 2023-03-04 NOTE — Patient Instructions (Signed)
 It was a pleasure to see you today!  Please schedule follow up scheduled with myself in 3 months.  If my schedule is not open yet, we will contact you with a reminder closer to that time. Please call [phone] if you haven't heard from [place] a month before, and always call us sooner if issues or concerns arise. You can also send [place] a message through MyChart, but but aware that this is not to be used for urgent issues and it may take up to 5-7 days to receive a reply. Please be aware that you will likely be able to view your results before I have a chance to respond to them. Please give [place] 5 business days to respond to any non-urgent results.    You have chronic cough. I have a high index of suspicion for your cough being related to reflux.  However first I think worthwhile to see ENT - get checked for sinus disease and look at your larynx/voice box since you have never had this done before.  Take flonase daily for the next 6-8 weeks and you can discontinue if no improvement. If all of this is negative I will send you back to the gastroenterology doctors. You did have some inflammation in your esophagus which probably needs follow up.   I do not think cough is related to your lungs.

## 2023-03-04 NOTE — Progress Notes (Signed)
 Alex Morrison    [id number]    [birth year]  Primary Care Physician:Tisovec, Adelfa Koh, MD  Referring Physician: Gaspar Garbe, MD [address] [place],  [place] [address] Reason for Consultation: chronic cough Date of Consultation: 03/04/2023  Chief complaint:   Chief Complaint  Patient presents with   Consult    Pt states he hasd this cough for 5 years, mostly clear sputum     HPI: Alex Morrison is a 53 y.o. man who presents for new patient evaluation for chronic cough. Past medical history of ESRD on HD, DM2 on insulin, HTN.   Cough started 5-6 years ago. Has a wet quality, sometimes with mucus. Occasionally coughs to the point of vomiting. He feels like the cough is coming from deeper inside his chest. Worse with talking too long. Worse after finishing a meal sometimes. He feels like food gets stuck in his esophagus. Had gastric emptying study in 2021 - which showed delayed gastric emptying with less than half of his stomach emptied at the 3 hour mark. He had an endoscopy  The cough gets worse if he lays flat, requiring him to sit up. He has tried pantoprazole in the past. He doesn't feel this was helpful.   He chronic daily post nasal drainage which he notices after he has coughing fits/spells. Denies itchy/watery eyes, seasonal variation. He does not take anything for this. Took flonase for a week or two and didn't find it helpful. This was a long time ago.   Denies dyspnea. No chest tightness, wheezing.   He saw Dr. Jearld Fenton in ENT in 2021 - was told he has LPR and started BID PPI and scheduled follow up with GI. Did not have a laryncoscopy at that time.   Social history:  Occupation: owns a [employer].  Exposures: lives at home with brother.  Smoking history: never smoker, passive smoke exposure in childhood  Social History   Occupational History   Not on file  Tobacco Use   Smoking status: Never    Passive exposure: Past   Smokeless tobacco: Never   Vaping Use   Vaping status: Never Used  Substance and Sexual Activity   Alcohol use: No   Drug use: No   Sexual activity: Not Currently    Relevant family history:  Family History  Problem Relation Age of Onset   Diabetes Mellitus II Mother    Heart disease Mother    Diabetes Mother    Diabetes Mellitus I Father    Diabetes Father    Diabetes Mellitus II Brother    Colon cancer Neg Hx    Esophageal cancer Neg Hx    Pancreatic cancer Neg Hx    Stomach cancer Neg Hx    Lung disease Neg Hx     Past Medical History:  Diagnosis Date   Abscess of buttock    Acute kidney injury (HCC) 09/27/2013   Depression    Diabetes mellitus without complication (HCC)    Diabetic neuropathy (HCC)    Dialysis patient (HCC)    M,W,F   ESRD (end stage renal disease) (HCC)    Glaucoma    HTN (hypertension) 08/08/2018   Obesity    Perforated appendix    Peritonitis   Psoriasis    Retinopathy     Past Surgical History:  Procedure Laterality Date   ABSCESS      RECTAL    APPENDECTOMY     AV FISTULA PLACEMENT  Left 01/13/2019   Procedure: LEFT ARM ARTERIOVENOUS (AV) FISTULA CREATION;  Surgeon: Sherren Kerns, MD;  Location: [facility] OR;  Service: Vascular;  Laterality: Left;   CATARACT EXTRACTION     Ex lap with ileocecectomy  09/27/2013   Dr. Luisa Hart   HARDWARE REMOVAL Right 06/13/2021   Procedure: HARDWARE REMOVAL;  Surgeon: Teryl Lucy, MD;  Location: [facility] OR;  Service: Orthopedics;  Laterality: Right;   ILEOCECETOMY     INCISION AND DRAINAGE     INCISIONAL HERNIA REPAIR N/A 08/11/2018   Procedure: OPEN RETRORECTUS REPAIR OF INCARCERATED INCISIONAL HERNIA WITH BILATERAL POSTERIOR COMPONENT SEPARATION;  Surgeon: Gaynelle Adu, MD;  Location: [facility] OR;  Service: General;  Laterality: N/A;   INSERTION OF MESH  08/11/2018   Procedure: INSERTION OF PHASIX MESH;  Surgeon: Gaynelle Adu, MD;  Location: [facility] OR;  Service: General;;   insertion of PD catheter  12/2017   IR FLUORO GUIDE CV LINE  RIGHT  08/08/2018   IR US GUIDE VASC ACCESS RIGHT  08/08/2018   LAPAROSCOPIC ABDOMINAL EXPLORATION N/A 09/27/2013   LAPAROSCOPIC ASSISTED VENTRAL HERNIA REPAIR  2019   LAPAROSCOPY N/A 09/27/2013   Procedure: LAPAROSCOPY DIAGNOSTIC;  Surgeon: Harriette Bouillon, MD;  Location: [facility] OR;  Service: General;  Laterality: N/A;   LAPAROTOMY  08/11/2018   Procedure: EXPLORATORY LAPAROTOMY;  Surgeon: Gaynelle Adu, MD;  Location: [facility] OR;  Service: General;;   LYSIS OF ADHESION  08/11/2018   Procedure: LYSIS OF ADHESIONS X 30 MINUTES;  Surgeon: Gaynelle Adu, MD;  Location: [facility] OR;  Service: General;;   MINOR REMOVAL OF PERITONEAL DIALYSIS CATHETER N/A 01/06/2019   Procedure: REMOVAL OF PERITONEAL DIALYSIS CATHETER;  Surgeon: Sheliah Hatch De Blanch, MD;  Location: [facility] ORS;  Service: General;  Laterality: N/A;   OPEN REDUCTION INTERNAL FIXATION (ORIF) TIBIA/FIBULA FRACTURE Right 03/07/2019   Procedure: OPEN REDUCTION INTERNAL FIXATION (ORIF) FIBULA FRACTURE;  Surgeon: Teryl Lucy, MD;  Location: [facility] OR;  Service: Orthopedics;  Laterality: Right;   ORIF TIBIA PLATEAU Right 03/07/2019   Procedure: Open Reduction Internal Fixation (Orif) Tibial Plateau;  Surgeon: Teryl Lucy, MD;  Location: [facility] OR;  Service: Orthopedics;  Laterality: Right;   SHOULDER SURGERY Left 1989   TOTAL KNEE ARTHROPLASTY Right 08/08/2021   Procedure: TOTAL KNEE ARTHROPLASTY;  Surgeon: Teryl Lucy, MD;  Location: [facility] OR;  Service: Orthopedics;  Laterality: Right;     Physical Exam: Blood pressure (!) 161/77, pulse 75, height 5\' 11"  (1.803 m), weight 223 lb (101.2 kg), SpO2 96%. Gen:      No acute distress ENT:  +cobblestoning, erythema, no nasal polyps, mucus membranes moist Lungs:    No increased respiratory effort, symmetric chest wall excursion, clear to auscultation bilaterally, no wheezes or crackles CV:         Regular rate and rhythm; systolic murmur, no rubs, or gallops.  No pedal edema Abd:      + bowel sounds; soft,  non-tender; no distension MSK: no acute synovitis of DIP or PIP joints, no mechanics hands.  Skin:      Warm and dry; no rashes Neuro: normal speech, no focal facial asymmetry Psych: alert and oriented x3, normal mood and affect   Data Reviewed/Medical Decision Making:  Independent interpretation of tests: Imaging:  Review of patient's cjhest xray 2022 images revealed no acute process, trace bilateral pleural effusion. The patient's images have been independently reviewed by me.    PFTs:    Labs:  Lab Results  Component Value Date   NA 139 05/01/2022  K 4.8 05/01/2022   CO2 32 05/01/2022   GLUCOSE 144 (H) 05/01/2022   BUN 45 (H) 05/01/2022   CREATININE 7.10 (H) 05/01/2022   CALCIUM 9.1 05/01/2022   EGFR 9 (L) 05/01/2022   GFRNONAA 14 03/12/2022   Lab Results  Component Value Date   WBC 6.4 05/01/2022   HGB 9.0 (L) 05/01/2022   HCT 28.2 (L) 05/01/2022   MCV 86.5 05/01/2022   PLT 322 05/01/2022   Absolute eosinophils 301 in April 2024  Immunization status:  Immunization History  Administered Date(s) Administered   Hepatitis B, ADULT 12/25/2017, 02/20/2018, 03/21/2018, 06/24/2018, 09/03/2018, 10/01/2018, 11/05/2018, 03/04/2019   Influenza,inj,Quad PF,6+ Mos 10/15/2018, 10/16/2019   Influenza,inj,quad, With Preservative 12/24/2017   Influenza-Unspecified 10/15/2018   PFIZER(Purple Top)SARS-COV-2 Vaccination 09/11/2019   Pneumococcal Conjugate-13 12/25/2017   Pneumococcal Polysaccharide-23 09/30/2013, 12/12/2018     I reviewed prior external note(s) from gastroenterology, ENT  I reviewed the result(s) of the labs and imaging as noted above.   I have ordered    Assessment:  Chronic Cough  Plan/Recommendations: You have chronic cough. I have a high index of suspicion for your cough being related to reflux.   However first I think worthwhile to see ENT - get checked for sinus disease and look at your larynx/voice box since you have never had this done before.    Take flonase daily for the next 6-8 weeks and you can discontinue if no improvement. If all of this is negative I will send you back to the gastroenterology doctors. You did have some inflammation in your esophagus which probably needs follow up.   I do not think cough is related to your lungs.   We discussed disease management and progression at length today.   Return to Care: Return in about 3 months (around 06/01/2023).  Durel Salts, MD Pulmonary and Critical Care Medicine [facility] Office:[phone]  CC: Tisovec, Adelfa Koh, MD

## 2023-06-04 ENCOUNTER — Ambulatory Visit: Payer: BC Managed Care – PPO | Admitting: Internal Medicine

## 2023-06-05 ENCOUNTER — Ambulatory Visit (INDEPENDENT_AMBULATORY_CARE_PROVIDER_SITE_OTHER): Admitting: Otolaryngology

## 2023-06-05 ENCOUNTER — Encounter (INDEPENDENT_AMBULATORY_CARE_PROVIDER_SITE_OTHER): Payer: Self-pay | Admitting: Otolaryngology

## 2023-06-05 VITALS — BP 131/77 | HR 74 | Ht 71.0 in | Wt 220.0 lb

## 2023-06-05 DIAGNOSIS — K219 Gastro-esophageal reflux disease without esophagitis: Secondary | ICD-10-CM | POA: Diagnosis not present

## 2023-06-05 DIAGNOSIS — J342 Deviated nasal septum: Secondary | ICD-10-CM

## 2023-06-05 DIAGNOSIS — R0982 Postnasal drip: Secondary | ICD-10-CM

## 2023-06-05 DIAGNOSIS — R0981 Nasal congestion: Secondary | ICD-10-CM | POA: Diagnosis not present

## 2023-06-05 DIAGNOSIS — J343 Hypertrophy of nasal turbinates: Secondary | ICD-10-CM

## 2023-06-05 DIAGNOSIS — R053 Chronic cough: Secondary | ICD-10-CM | POA: Diagnosis not present

## 2023-06-05 DIAGNOSIS — J3089 Other allergic rhinitis: Secondary | ICD-10-CM

## 2023-06-05 MED ORDER — LEVOCETIRIZINE DIHYDROCHLORIDE 5 MG PO TABS: 5.0000 mg | ORAL_TABLET | Freq: Every evening | ORAL | 3 refills | Status: AC

## 2023-06-05 MED ORDER — FLUTICASONE PROPIONATE 50 MCG/ACT NA SUSP: 2.0000 | Freq: Two times a day (BID) | NASAL | 6 refills | Status: AC

## 2023-06-05 MED ORDER — FAMOTIDINE 20 MG PO TABS: 20.0000 mg | ORAL_TABLET | Freq: Two times a day (BID) | ORAL | 3 refills | Status: DC

## 2023-06-05 NOTE — Patient Instructions (Addendum)
 Lloyd Huger Med Nasal Saline Rinse   - start nasal saline rinses with NeilMed Bottle available over the counter or online to help with nasal congestion     GamingLesson.nl - check out this website to learn more about reflux   -Avoid lying down for at least two hours after a meal or after drinking acidic beverages, like soda, or other caffeinated beverages. This can help to prevent stomach contents from flowing back into the esophagus. -Keep your head elevated while you sleep. Using an extra pillow or two can also help to prevent reflux. -Eat smaller and more frequent meals each day instead of a few large meals. This promotes digestion and can aid in preventing heartburn. -Wear loose-fitting clothes to ease pressure on the stomach, which can worsen heartburn and reflux. -Reduce excess weight around the midsection. This can ease pressure on the stomach. Such pressure can force some stomach contents back up the esophagus  - Take Reflux Gourmet (natural supplement available on Amazon) to help with symptoms of chronic throat irritation

## 2023-06-05 NOTE — Progress Notes (Signed)
 ENT CONSULT:  Reason for Consult: chronic cough   HPI: Discussed the use of AI scribe software for clinical note transcription with the patient, who gave verbal consent to proceed.  History of Present Illness Alex Morrison is a 53 year old male with hx of RA (not on medication for it yet), hx of GERD and chronic nasal congestion who p/w chronic cough. He was referred by his pulmonary doctor for further evaluation of his chronic cough.  He has experienced a chronic cough for approximately six years. The cough is primarily dry but can occasionally be productive with clear to brown sputum. He is not sure if he has post-nasal drainage. He has not undergone allergy testing in the past.  He was evaluated by an ENT in 2021 and was informed that reflux might be contributing to his symptoms. He tried Pepcid without relief and is not currently on any reflux medications.   He has a history of a posterior neck abscess that was drained five months ago.   No history of smoking and no frequent sore throats. He is not currently using Flonase  or any allergy medications.  He is under rheumatology care for rheumatoid arthritis but has not started any medications for it yet.    Records Reviewed:  Pulm office visit 03/04/23 JUSITN Morrison is a 53 y.o. man who presents for new patient evaluation for chronic cough. Past medical history of ESRD on HD, DM2 on insulin , HTN.    Cough started 5-6 years ago. Has a wet quality, sometimes with mucus. Occasionally coughs to the point of vomiting. He feels like the cough is coming from deeper inside his chest. Worse with talking too long. Worse after finishing a meal sometimes. He feels like food gets stuck in his esophagus. Had gastric emptying study in 2021 - which showed delayed gastric emptying with less than half of his stomach emptied at the 3 hour mark. He had an endoscopy   The cough gets worse if he lays flat, requiring him to sit up. He has tried pantoprazole   in the past. He doesn't feel this was helpful.    He chronic daily post nasal drainage which he notices after he has coughing fits/spells. Denies itchy/watery eyes, seasonal variation. He does not take anything for this. Took flonase  for a week or two and didn't find it helpful. This was a long time ago.    Denies dyspnea. No chest tightness, wheezing.    He saw Dr. Virgia Griffins in ENT in 2021 - was told he has LPR and started BID PPI and scheduled follow up with GI. Did not have a laryncoscopy at that time.   You have chronic cough. I have a high index of suspicion for your cough being related to reflux.    However first I think worthwhile to see ENT - get checked for sinus disease and look at your larynx/voice box since you have never had this done before.    Take flonase  daily for the next 6-8 weeks and you can discontinue if no improvement. If all of this is negative I will send you back to the gastroenterology doctors. You did have some inflammation in your esophagus which probably needs follow up.     Past Medical History:  Diagnosis Date   Abscess of buttock    Acute kidney injury (HCC) 09/27/2013   Depression    Diabetes mellitus without complication (HCC)    Diabetic neuropathy (HCC)    Dialysis patient (HCC)  M,W,F   ESRD (end stage renal disease) (HCC)    Glaucoma    HTN (hypertension) 08/08/2018   Obesity    Perforated appendix    Peritonitis   Psoriasis    Retinopathy     Past Surgical History:  Procedure Laterality Date   ABSCESS      RECTAL    APPENDECTOMY     AV FISTULA PLACEMENT Left 01/13/2019   Procedure: LEFT ARM ARTERIOVENOUS (AV) FISTULA CREATION;  Surgeon: Richrd Char, MD;  Location: [facility] OR;  Service: Vascular;  Laterality: Left;   CATARACT EXTRACTION     Ex lap with ileocecectomy  09/27/2013   Dr. Afton Horse   HARDWARE REMOVAL Right 06/13/2021   Procedure: HARDWARE REMOVAL;  Surgeon: Osa Blase, MD;  Location: [facility] OR;  Service: Orthopedics;   Laterality: Right;   ILEOCECETOMY     INCISION AND DRAINAGE     INCISIONAL HERNIA REPAIR N/A 08/11/2018   Procedure: OPEN RETRORECTUS REPAIR OF INCARCERATED INCISIONAL HERNIA WITH BILATERAL POSTERIOR COMPONENT SEPARATION;  Surgeon: Aldean Hummingbird, MD;  Location: [facility] OR;  Service: General;  Laterality: N/A;   INSERTION OF MESH  08/11/2018   Procedure: INSERTION OF PHASIX MESH;  Surgeon: Aldean Hummingbird, MD;  Location: [facility] OR;  Service: General;;   insertion of PD catheter  12/2017   IR FLUORO GUIDE CV LINE RIGHT  08/08/2018   IR US  GUIDE VASC ACCESS RIGHT  08/08/2018   LAPAROSCOPIC ABDOMINAL EXPLORATION N/A 09/27/2013   LAPAROSCOPIC ASSISTED VENTRAL HERNIA REPAIR  2019   LAPAROSCOPY N/A 09/27/2013   Procedure: LAPAROSCOPY DIAGNOSTIC;  Surgeon: Sim Dryer, MD;  Location: [facility] OR;  Service: General;  Laterality: N/A;   LAPAROTOMY  08/11/2018   Procedure: EXPLORATORY LAPAROTOMY;  Surgeon: Aldean Hummingbird, MD;  Location: [facility] OR;  Service: General;;   LYSIS OF ADHESION  08/11/2018   Procedure: LYSIS OF ADHESIONS X 30 MINUTES;  Surgeon: Aldean Hummingbird, MD;  Location: [facility] OR;  Service: General;;   MINOR REMOVAL OF PERITONEAL DIALYSIS CATHETER N/A 01/06/2019   Procedure: REMOVAL OF PERITONEAL DIALYSIS CATHETER;  Surgeon: Dorrie Gaudier Alphonso Aschoff, MD;  Location: [facility] ORS;  Service: General;  Laterality: N/A;   OPEN REDUCTION INTERNAL FIXATION (ORIF) TIBIA/FIBULA FRACTURE Right 03/07/2019   Procedure: OPEN REDUCTION INTERNAL FIXATION (ORIF) FIBULA FRACTURE;  Surgeon: Osa Blase, MD;  Location: [facility] OR;  Service: Orthopedics;  Laterality: Right;   ORIF TIBIA PLATEAU Right 03/07/2019   Procedure: Open Reduction Internal Fixation (Orif) Tibial Plateau;  Surgeon: Osa Blase, MD;  Location: [facility] OR;  Service: Orthopedics;  Laterality: Right;   SHOULDER SURGERY Left 1989   TOTAL KNEE ARTHROPLASTY Right 08/08/2021   Procedure: TOTAL KNEE ARTHROPLASTY;  Surgeon: Osa Blase, MD;  Location: [facility] OR;  Service: Orthopedics;   Laterality: Right;    Family History  Problem Relation Age of Onset   Diabetes Mellitus II Mother    Heart disease Mother    Diabetes Mother    Diabetes Mellitus I Father    Diabetes Father    Diabetes Mellitus II Brother    Colon cancer Neg Hx    Esophageal cancer Neg Hx    Pancreatic cancer Neg Hx    Stomach cancer Neg Hx    Lung disease Neg Hx     Social History:  reports that he has never smoked. He has been exposed to tobacco smoke. He has never used smokeless tobacco. He reports that he does not drink alcohol  and does not use drugs.  Allergies: No Known Allergies  Medications:  I have reviewed the patient's current medications.  The PMH, PSH, Medications, Allergies, and SH were reviewed and updated.  ROS: Constitutional: Negative for fever, weight loss and weight gain. Cardiovascular: Negative for chest pain and dyspnea on exertion. Respiratory: Is not experiencing shortness of breath at rest. Gastrointestinal: Negative for nausea and vomiting. Neurological: Negative for headaches. Psychiatric: The patient is not nervous/anxious  Blood pressure 131/77, pulse 74, height 5\' 11"  (1.803 m), weight 220 lb (99.8 kg), SpO2 92%. Body mass index is 30.68 kg/m.  PHYSICAL EXAM:  Exam: General: Well-developed, well-nourished Respiratory Respiratory effort: Equal inspiration and expiration without stridor Cardiovascular Peripheral Vascular: Warm extremities with equal color/perfusion Eyes: No nystagmus with equal extraocular motion bilaterally Neuro/Psych/Balance: Patient oriented to person, place, and time; Appropriate mood and affect; Gait is intact with no imbalance; Cranial nerves I-XII are intact Head and Face Inspection: Normocephalic and atraumatic without mass or lesion Palpation: Facial skeleton intact without bony stepoffs Salivary Glands: No mass or tenderness Facial Strength: Facial motility symmetric and full bilaterally ENT Pinna: External ear intact and  fully developed External canal: Canal is patent with intact skin Tympanic Membrane: Clear and mobile External Nose: No scar or anatomic deformity Internal Nose: Septum is S-shaped and deviated to the left. No polyp, or purulence. Mucosal edema and erythema present.  Bilateral inferior turbinate hypertrophy.  Lips, Teeth, and gums: Mucosa and teeth intact and viable TMJ: No pain to palpation with full mobility Oral cavity/oropharynx: No erythema or exudate, no lesions present Nasopharynx: No mass or lesion with intact mucosa Hypopharynx: Intact mucosa without pooling of secretions Larynx Glottic: Full true vocal cord mobility without lesion or mass Supraglottic: Normal appearing epiglottis and AE folds Interarytenoid Space: Moderate pachydermia&edema Subglottic Space: Patent without lesion or edema Neck Neck and Trachea: Midline trachea without mass or lesion Thyroid: No mass or nodularity Lymphatics: No lymphadenopathy  Procedure: Preoperative diagnosis: chronic cough   Postoperative diagnosis:   Same + GERD LPR + PND  Procedure: Flexible fiberoptic laryngoscopy  Surgeon: Artice Last, MD  Anesthesia: Topical lidocaine  and Afrin Complications: None Condition is stable throughout exam  Indications and consent:  The patient presents to the clinic with above symptoms. Indirect laryngoscopy view was incomplete. Thus it was recommended that they undergo a flexible fiberoptic laryngoscopy. All of the risks, benefits, and potential complications were reviewed with the patient preoperatively and verbal informed consent was obtained.  Procedure: The patient was seated upright in the clinic. Topical lidocaine  and Afrin were applied to the nasal cavity. After adequate anesthesia had occurred, I then proceeded to pass the flexible telescope into the nasal cavity. The nasal cavity was patent without rhinorrhea or polyp. The nasopharynx was also patent without mass or lesion. The base of  tongue was visualized and was normal. There were no signs of pooling of secretions in the piriform sinuses. The true vocal folds were mobile bilaterally. There were no signs of glottic or supraglottic mucosal lesion or mass. There was moderate interarytenoid pachydermia and post cricoid edema. The telescope was then slowly withdrawn and the patient tolerated the procedure throughout.    Studies Reviewed: CXR 07/20/20 MPRESSION: Small bilateral pleural effusions and peribronchial thickening, suggesting pulmonary edema.    Assessment/Plan: Encounter Diagnoses  Name Primary?   Chronic GERD    Chronic cough Yes   Chronic nasal congestion    Post-nasal drip    Hypertrophy of both inferior nasal turbinates    Nasal septal deviation    Environmental and seasonal allergies  Assessment and Plan Assessment & Plan Chronic cough Chronic cough for six years, dry with occasional clear to brown sputum. Differential includes postnasal drip, reflux, neurogenic cough. No smoking hx. Seen pulm and had CXR in 2022, no other chest imaging since. I discussed common etiologies of chronic cough including GERD/allergies and PND and lung disease with the patient and explained that in most cases the cause of chronic cough is multi-factorial - Order repeat chest x-ray. - Consider pulmonology referral for pulmonary function tests if symptoms persist. - medical management of GERD LPR and post-nasal drainage (both were noted on exam today) - Re-evaluate in six months.   Chronic nasal congestion Postnasal drip Environmental Allergies  Significant postnasal drainage noted during scope exam likely contributing to cough. Suspected inhalant allergies. No prior allergy testing or treatment. - Initiate daily Xyzal. - Start daily nasal rinses with distilled water and salt. - Prescribe Flonase  nasal spray. - Discussed allergy testing and shots if candidate, opted to hold off.  GERD LPR Reflux irritation possibly  contributing to chronic cough. Previous Pepcid trial ineffective. Considered non-acidic or silent reflux. - Recommend lifestyle modifications: avoid late meals, reduce caffeine. - Suggest trial of Reflux Gourmet seaweed supplement after dinner. - Resume Pepcid.   RTC 6 months   Thank you for allowing me to participate in the care of this patient. Please do not hesitate to contact me with any questions or concerns.   Artice Last, MD Otolaryngology [facility] Phone: [phone] Fax: [phone]    06/05/2023, 2:31 PM

## 2023-06-15 ENCOUNTER — Other Ambulatory Visit (INDEPENDENT_AMBULATORY_CARE_PROVIDER_SITE_OTHER): Payer: Self-pay | Admitting: Otolaryngology

## 2023-08-12 ENCOUNTER — Telehealth: Payer: Self-pay | Admitting: Internal Medicine

## 2023-08-12 NOTE — Telephone Encounter (Signed)
 Patient hasn't been seen since May 2024 by Dr. Jeannetta. He will need to see Dr. Jeannetta first and we'll need update labs (CBC/CMP/TB gold)  Pharmacy team needs to get Enbrel  re approved through his insurance Can then schedule Enbrel  new start.

## 2023-08-12 NOTE — Telephone Encounter (Signed)
 Patient left a voicemail requesting to schedule an appointment with the pharmacist to start his Enbrel  medication.  Patient states he was due to start last year, but never got around to rescheduling.

## 2023-09-11 NOTE — Progress Notes (Signed)
 Office Visit Note  Patient: Alex Morrison             Date of Birth: 08/01/1970           MRN: [id number]             PCP: Vernadine Charlie LELON, MD Referring: Vernadine Charlie LELON, MD Visit Date: 09/25/2023   Subjective:  Medical Management of Chronic Issues (Wants to discuss getting a medication started either Rinvoq or Enbrel )  Discussed the use of AI scribe software for clinical note transcription with the patient, who gave verbal consent to proceed.  History of Present Illness   Alex Morrison is a 53 year old male who presents for follow-up of his psoriatic arthritis.  He has ongoing joint inflammation and previously underwent fluid aspiration from the knee. The initiation of Enbrel  was delayed due to a tooth infection, and due to lack of follow up after that original appointment he has not yet started this treatment.  He has been using Tylenol  Arthritis, which was effective until about three months ago when his symptoms began to flare up again. The pain is widespread, affecting his knees, ankles, hands, hips, and shoulders.  He has a history of a total knee replacement and is currently on dialysis for end-stage kidney disease.      Previous HPI 05/21/2022 Alex Morrison is a 53 y.o. male here for follow up for inflammatory arthritis suspected PsA vs seronegative RA with left knee aspiration and injection last visit. Knee remains partially improved although redeveloping swelling quickly. He has continued joint pain and stiffness in both hands. Right shoulder is worst problem today. Labs at initial visit were consistent with ESRD, negative for hepatitis or TB screening, and synovial fluid with 82999 WBCs and no crystals.   Previous HPI 05/01/22 Alex Morrison is a 53 y.o. male here for evaluation of joint pain and swelling at multiple sites with considerable symptoms ongoing for the past 2 to 3 months.  His worst complaint area has mostly been with bilateral hand pain and swelling.   Also getting some symptoms in the left knee with associated swelling.  His right knee has been consistently swollen and with limited range of motion ever since he had joint replacement surgery last year.  He avoids taking any oral prednisone as previous treatment because severely uncontrolled diabetes and so he avoids and refuses taking steroids.  Currently using Tylenol  and Aleve  as needed which are partially helpful.  He does have end-stage renal disease on hemodialysis secondary to hypertension and diabetes.  He also has a history of skin psoriasis but never previously diagnosed with associated inflammatory arthritis.  He was never on systemic medical treatment for this either due to low symptom severity.   Labs reviewed ESR 80 CRP 71   Review of Systems  Constitutional:  Positive for fatigue.  HENT:  Positive for mouth dryness. Negative for mouth sores.   Eyes:  Negative for dryness.  Respiratory:  Negative for shortness of breath.   Cardiovascular:  Negative for chest pain and palpitations.  Gastrointestinal:  Negative for blood in stool, constipation and diarrhea.  Endocrine: Negative for increased urination.  Genitourinary:  Negative for involuntary urination.  Musculoskeletal:  Positive for joint pain, gait problem, joint pain, joint swelling, myalgias, muscle weakness, morning stiffness, muscle tenderness and myalgias.  Skin:  Positive for sensitivity to sunlight. Negative for color change, rash and hair loss.  Allergic/Immunologic: Negative for susceptible to infections.  Neurological:  Negative for dizziness and headaches.  Hematological:  Negative for swollen glands.  Psychiatric/Behavioral:  Negative for depressed mood and sleep disturbance. The patient is not nervous/anxious.     PMFS History:  Patient Active Problem List   Diagnosis Date Noted   Seronegative rheumatoid arthritis (HCC) 05/21/2022   Sedimentation rate elevation 05/01/2022   Arthrosis of knee 08/08/2021    S/P total knee replacement, right 08/08/2021   Tibial plateau fracture 03/06/2019   ESRD (end stage renal disease) (HCC) 08/08/2018   HTN (hypertension) 08/08/2018   SBO (small bowel obstruction) (HCC) 08/08/2018   Periumbilical hernia 08/08/2018   Abdominal hernia as complication of peritoneal dialysis 08/08/2018   Acute gangrenous appendicitis with perforation and peritonitis 09/27/2013   Type 1 diabetes mellitus (HCC) 09/27/2013   Acute kidney injury 09/27/2013   DM (diabetes mellitus) type 2, uncontrolled, with ketoacidosis (HCC) 07/17/2012   DKA (diabetic ketoacidosis) (HCC) 07/16/2012   Perirectal abscess 07/14/2012    Past Medical History:  Diagnosis Date   Abscess of buttock    Acute kidney injury 09/27/2013   Depression    Diabetes mellitus without complication (HCC)    Diabetic neuropathy (HCC)    Dialysis patient    M,W,F   ESRD (end stage renal disease) (HCC)    Glaucoma    HTN (hypertension) 08/08/2018   Kidney disease 2019   Obesity    Perforated appendix    Peritonitis   Psoriasis    Retinopathy     Family History  Problem Relation Age of Onset   Diabetes Mellitus II Mother    Heart disease Mother    Diabetes Mother    Diabetes Mellitus I Father    Diabetes Father    Diabetes Mellitus II Brother    Colon cancer Neg Hx    Esophageal cancer Neg Hx    Pancreatic cancer Neg Hx    Stomach cancer Neg Hx    Lung disease Neg Hx    Past Surgical History:  Procedure Laterality Date   ABSCESS      RECTAL    APPENDECTOMY     AV FISTULA PLACEMENT Left 01/13/2019   Procedure: LEFT ARM ARTERIOVENOUS (AV) FISTULA CREATION;  Surgeon: Harvey Carlin BRAVO, MD;  Location: [facility] OR;  Service: Vascular;  Laterality: Left;   CATARACT EXTRACTION     Ex lap with ileocecectomy  09/27/2013   Dr. Vanderbilt   HARDWARE REMOVAL Right 06/13/2021   Procedure: HARDWARE REMOVAL;  Surgeon: Josefina Chew, MD;  Location: [facility] OR;  Service: Orthopedics;  Laterality: Right;   ILEOCECETOMY      INCISION AND DRAINAGE     INCISIONAL HERNIA REPAIR N/A 08/11/2018   Procedure: OPEN RETRORECTUS REPAIR OF INCARCERATED INCISIONAL HERNIA WITH BILATERAL POSTERIOR COMPONENT SEPARATION;  Surgeon: Tanda Locus, MD;  Location: [facility] OR;  Service: General;  Laterality: N/A;   INSERTION OF MESH  08/11/2018   Procedure: INSERTION OF PHASIX MESH;  Surgeon: Tanda Locus, MD;  Location: [facility] OR;  Service: General;;   insertion of PD catheter  12/2017   IR FLUORO GUIDE CV LINE RIGHT  08/08/2018   IR US  GUIDE VASC ACCESS RIGHT  08/08/2018   LAPAROSCOPIC ABDOMINAL EXPLORATION N/A 09/27/2013   LAPAROSCOPIC ASSISTED VENTRAL HERNIA REPAIR  2019   LAPAROSCOPY N/A 09/27/2013   Procedure: LAPAROSCOPY DIAGNOSTIC;  Surgeon: Debby Vanderbilt, MD;  Location[facility] OR;  Service: General;  Laterality: N/A;   LAPAROTOMY  08/11/2018   Procedure: EXPLORATORY LAPAROTOMY;  Surgeon: Tanda Locus, MD;  Location: [facility] OR;  Service: General;;   LYSIS OF ADHESION  08/11/2018   Procedure: LYSIS OF ADHESIONS X 30 MINUTES;  Surgeon: Tanda Locus, MD;  Location: [facility] OR;  Service: General;;   MINOR REMOVAL OF PERITONEAL DIALYSIS CATHETER N/A 01/06/2019   Procedure: REMOVAL OF PERITONEAL DIALYSIS CATHETER;  Surgeon: Stevie Herlene Righter, MD;  Location: [facility] ORS;  Service: General;  Laterality: N/A;   OPEN REDUCTION INTERNAL FIXATION (ORIF) TIBIA/FIBULA FRACTURE Right 03/07/2019   Procedure: OPEN REDUCTION INTERNAL FIXATION (ORIF) FIBULA FRACTURE;  Surgeon: Josefina Chew, MD;  Location: [facility] OR;  Service: Orthopedics;  Laterality: Right;   ORIF TIBIA PLATEAU Right 03/07/2019   Procedure: Open Reduction Internal Fixation (Orif) Tibial Plateau;  Surgeon: Josefina Chew, MD;  Location: [facility] OR;  Service: Orthopedics;  Laterality: Right;   SHOULDER SURGERY Left 1989   TOTAL KNEE ARTHROPLASTY Right 08/08/2021   Procedure: TOTAL KNEE ARTHROPLASTY;  Surgeon: Josefina Chew, MD;  Location: [facility] OR;  Service: Orthopedics;  Laterality: Right;   Social  History   Social History Narrative   Not on file   Immunization History  Administered Date(s) Administered   Hepatitis B, ADULT 12/25/2017, 02/20/2018, 03/21/2018, 06/24/2018, 09/03/2018, 10/01/2018, 11/05/2018, 03/04/2019   Influenza,inj,Quad PF,6+ Mos 10/15/2018, 10/16/2019   Influenza,inj,quad, With Preservative 12/24/2017   Influenza-Unspecified 10/15/2018   PFIZER(Purple Top)SARS-COV-2 Vaccination 09/11/2019   Pneumococcal Conjugate-13 12/25/2017   Pneumococcal Polysaccharide-23 09/30/2013, 12/12/2018     Objective: Vital Signs: BP 132/64   Pulse 69   Temp (!) 97.2 F (36.2 C)   Resp 16   Ht 5' 11 (1.803 m)   Wt 201 lb (91.2 kg)   BMI 28.03 kg/m    Physical Exam Eyes:     Conjunctiva/sclera: Conjunctivae normal.  Cardiovascular:     Rate and Rhythm: Normal rate and regular rhythm.     Comments: LUE AVF thrill Pulmonary:     Effort: Pulmonary effort is normal.     Breath sounds: Normal breath sounds.  Skin:    General: Skin is warm and dry.  Neurological:     Mental Status: He is alert.  Psychiatric:        Mood and Affect: Mood normal.      Musculoskeletal Exam:  Right shoulder very painful with external rotation and with overhead abduction, lateral tenderness to pressure no palpable effusion Elbows full ROM no tenderness or swelling Wrists full ROM no tenderness or swelling Fingers swelling in multiple joints, most prominent in the right fifth finger PIP joint. Left thumb warm to touch. Swelling at the tip of the third finger.  Knees bilateral restricted ROM, right knee chronic thickening left knee small effusion present   Investigation: No additional findings.  Imaging: No results found.  Recent Labs: Lab Results  Component Value Date   WBC 5.2 09/25/2023   HGB 9.9 (L) 09/25/2023   PLT 135 (L) 09/25/2023   NA 138 09/25/2023   K 4.1 09/25/2023   CL 95 (L) 09/25/2023   CO2 33 (H) 09/25/2023   GLUCOSE 247 (H) 09/25/2023   BUN 15 09/25/2023    CREATININE 4.61 (H) 09/25/2023   BILITOT 0.5 09/25/2023   ALKPHOS 108 07/20/2020   AST 14 09/25/2023   ALT 10 09/25/2023   PROT 6.5 09/25/2023   ALBUMIN 3.8 07/20/2020   CALCIUM  9.2 09/25/2023   GFRAA 6 (L) 03/13/2019   QFTBGOLDPLUS NEGATIVE 09/25/2023    Speciality Comments: Only Check Blood Pressure in Right arm.   Procedures:  No procedures performed Allergies: Patient has no known allergies.  Assessment / Plan:     Visit Diagnoses: Psoriatic arthritis (HCC) - S/P Right Subacromial bursa 05/21/2022 - Plan: Sedimentation rate Chronic psoriatic arthritis with recent exacerbation, joint inflammation in multiple areas. Previous consideration of seronegative RA but pattern more consistent with PsA. Previous Tylenol  effective until three months ago. - Start Enbrel  (etanercept ) therapy 50 mg [place] weekly - Consider low dose steroid for transient relief if symptoms worsen.  High risk medication use - Plan: CBC with Differential/Platelet, Comprehensive metabolic panel with GFR, QuantiFERON-TB Gold Plus Rechecking baseline labs CBC, CMp, B screening for starting TNF inhibitor. Reviewed risks of medication including injection reactions, infections, malignancy, CHF. No previous history or contraindication.  End stage renal disease on dialysis Enbrel  preferred due to safety profile limiting csDMARDs       Orders: Orders Placed This Encounter  Procedures   Sedimentation rate   CBC with Differential/Platelet   Comprehensive metabolic panel with GFR   QuantiFERON-TB Gold Plus   No orders of the defined types were placed in this encounter.    Follow-Up Instructions: Return in about 3 months (around 12/25/2023) for RA ENB start f/u 3mos.   Lonni LELON Ester, MD  Note - This record has been created using [employer].  Chart creation errors have been sought, but may not always  have been located. Such creation errors do not reflect on  the standard of medical care.

## 2023-09-25 ENCOUNTER — Encounter: Payer: Self-pay | Admitting: Internal Medicine

## 2023-09-25 ENCOUNTER — Ambulatory Visit: Attending: Internal Medicine | Admitting: Internal Medicine

## 2023-09-25 VITALS — BP 132/64 | HR 69 | Temp 97.2°F | Resp 16 | Ht 71.0 in | Wt 201.0 lb

## 2023-09-25 DIAGNOSIS — L4050 Arthropathic psoriasis, unspecified: Secondary | ICD-10-CM | POA: Insufficient documentation

## 2023-09-25 DIAGNOSIS — N186 End stage renal disease: Secondary | ICD-10-CM | POA: Diagnosis present

## 2023-09-25 DIAGNOSIS — M0600 Rheumatoid arthritis without rheumatoid factor, unspecified site: Secondary | ICD-10-CM

## 2023-09-25 DIAGNOSIS — L409 Psoriasis, unspecified: Secondary | ICD-10-CM

## 2023-09-25 DIAGNOSIS — Z79899 Other long term (current) drug therapy: Secondary | ICD-10-CM | POA: Insufficient documentation

## 2023-09-25 NOTE — Progress Notes (Signed)
 Plan to restart benefits for Enbrel  Mini. Was lost to follow-up. Pharmacy team will restart Enbrel  Mini BIV. Plan to start patient in office once approved.  Sherry Pennant, PharmD, MPH, BCPS, CPP Clinical Pharmacist [facility])

## 2023-09-26 ENCOUNTER — Telehealth: Payer: Self-pay

## 2023-09-26 ENCOUNTER — Ambulatory Visit: Payer: Self-pay | Admitting: Internal Medicine

## 2023-09-26 NOTE — Progress Notes (Signed)
 Hemoglobin was 9.9 this is in the target range for dialysis patients. Platelets were slightly low at 135. Blood sugar was slightly high at 247. Sedimentation rate was 9 which is actually good considering the multiple swollen joints. No results are a problem for starting medication as planned.

## 2023-09-26 NOTE — Telephone Encounter (Addendum)
 Submitted a Prior Authorization request to [facility] for ENBREL  via CoverMyMeds. Will update once we receive a response.  Key: [id number]   ----- Message from Sherry GORMAN Pennant sent at 09/25/2023  3:15 PM EDT ----- Please restart Enbrel  Mini BIV. He was lost to follow-up last year. We actually still have 3 doses in fridge here but need to confirm Enbrel  benefits

## 2023-09-27 ENCOUNTER — Other Ambulatory Visit (HOSPITAL_COMMUNITY): Payer: Self-pay

## 2023-09-27 ENCOUNTER — Other Ambulatory Visit: Payer: Self-pay

## 2023-09-27 ENCOUNTER — Encounter (HOSPITAL_COMMUNITY): Payer: Self-pay

## 2023-09-27 LAB — CBC WITH DIFFERENTIAL/PLATELET
Absolute Lymphocytes: 790 {cells}/uL — ABNORMAL LOW (ref 850–3900)
Absolute Monocytes: 400 {cells}/uL (ref 200–950)
Basophils Absolute: 31 {cells}/uL (ref 0–200)
Basophils Relative: 0.6 %
Eosinophils Absolute: 172 {cells}/uL (ref 15–500)
Eosinophils Relative: 3.3 %
HCT: 30.9 % — ABNORMAL LOW (ref 38.5–50.0)
Hemoglobin: 9.9 g/dL — ABNORMAL LOW (ref 13.2–17.1)
MCH: 29.6 pg (ref 27.0–33.0)
MCHC: 32 g/dL (ref 32.0–36.0)
MCV: 92.2 fL (ref 80.0–100.0)
MPV: 9.4 fL (ref 7.5–12.5)
Monocytes Relative: 7.7 %
Neutro Abs: 3806 {cells}/uL (ref 1500–7800)
Neutrophils Relative %: 73.2 %
Platelets: 135 Thousand/uL — ABNORMAL LOW (ref 140–400)
RBC: 3.35 Million/uL — ABNORMAL LOW (ref 4.20–5.80)
RDW: 13.8 % (ref 11.0–15.0)
Total Lymphocyte: 15.2 %
WBC: 5.2 Thousand/uL (ref 3.8–10.8)

## 2023-09-27 LAB — COMPREHENSIVE METABOLIC PANEL WITH GFR
AG Ratio: 2 (calc) (ref 1.0–2.5)
ALT: 10 U/L (ref 9–46)
AST: 14 U/L (ref 10–35)
Albumin: 4.3 g/dL (ref 3.6–5.1)
Alkaline phosphatase (APISO): 140 U/L (ref 35–144)
BUN/Creatinine Ratio: 3 (calc) — ABNORMAL LOW (ref 6–22)
BUN: 15 mg/dL (ref 7–25)
CO2: 33 mmol/L — ABNORMAL HIGH (ref 20–32)
Calcium: 9.2 mg/dL (ref 8.6–10.3)
Chloride: 95 mmol/L — ABNORMAL LOW (ref 98–110)
Creat: 4.61 mg/dL — ABNORMAL HIGH (ref 0.70–1.30)
Globulin: 2.2 g/dL (ref 1.9–3.7)
Glucose, Bld: 247 mg/dL — ABNORMAL HIGH (ref 65–99)
Potassium: 4.1 mmol/L (ref 3.5–5.3)
Sodium: 138 mmol/L (ref 135–146)
Total Bilirubin: 0.5 mg/dL (ref 0.2–1.2)
Total Protein: 6.5 g/dL (ref 6.1–8.1)
eGFR: 14 mL/min/1.73m2 — ABNORMAL LOW (ref >=60)

## 2023-09-27 LAB — QUANTIFERON-TB GOLD PLUS
Mitogen-NIL: 7.46 [IU]/mL
NIL: 0.02 [IU]/mL
QuantiFERON-TB Gold Plus: NEGATIVE
TB1-NIL: 0 [IU]/mL
TB2-NIL: 0 [IU]/mL

## 2023-09-27 LAB — SEDIMENTATION RATE: Sed Rate: 9 mm/h (ref 0–20)

## 2023-09-27 NOTE — Telephone Encounter (Signed)
 Received notification from [facility] regarding a prior authorization for ENBREL . Authorization has been APPROVED from 09/26/2023 to 09/25/2024. Approval letter sent to scan center.  Per test claim, copay for 28 days supply is $0  Patient can fill through [facility]: [phone]   Authorization # [id number]  Once TB gold results and is negative, can schedule Enbrel  new start. We have leftover doses from our previous unsuccessful new start visit in fridge.  Sherry Pennant, PharmD, MPH, BCPS, CPP Clinical Pharmacist [facility])

## 2023-09-30 NOTE — Telephone Encounter (Signed)
 Patient can be scheduled for Enbrel  new start

## 2023-09-30 NOTE — Telephone Encounter (Signed)
 ATC patient to schedule Enbrel  new start. Left VM telling patient to call back

## 2023-10-01 NOTE — Telephone Encounter (Signed)
 ATC #2 patient to schedule Enbrel  new start. Left VM telling patient to call back.

## 2023-10-03 ENCOUNTER — Ambulatory Visit: Attending: Internal Medicine | Admitting: Pharmacist

## 2023-10-03 ENCOUNTER — Ambulatory Visit: Admitting: Pharmacist

## 2023-10-03 DIAGNOSIS — Z7189 Other specified counseling: Secondary | ICD-10-CM | POA: Insufficient documentation

## 2023-10-03 DIAGNOSIS — L409 Psoriasis, unspecified: Secondary | ICD-10-CM | POA: Insufficient documentation

## 2023-10-03 DIAGNOSIS — L4050 Arthropathic psoriasis, unspecified: Secondary | ICD-10-CM | POA: Diagnosis present

## 2023-10-03 DIAGNOSIS — Z79899 Other long term (current) drug therapy: Secondary | ICD-10-CM | POA: Diagnosis present

## 2023-10-03 MED ORDER — ENBREL MINI 50 MG/ML ~~LOC~~ SOCT
50.0000 mg | SUBCUTANEOUS | 2 refills | Status: AC
  Filled 2023-10-22: qty 4, 28d supply, fill #0
  Filled 2023-11-13: qty 4, 28d supply, fill #1

## 2023-10-03 NOTE — Patient Instructions (Signed)
 Your next ENBREL  dose is due on 10/10/23, 10/17/23, and every 7 days thereafter  HOLD ENBREL  if you have signs or symptoms of an infection. You can resume once you feel better or back to your baseline. HOLD ENBREL  if you start antibiotics to treat an infection. HOLD ENBREL  around the time of surgery/procedures. Your surgeon will be able to provide recommendations on when to hold BEFORE and when you are cleared to RESUME.  Pharmacy information: Your prescription will be shipped from [facility]. Their phone number is [phone] [facility] will call to schedule shipment and confirm address. They will mail your medication to your home.  Labs are due in 1 month then every 3 months. Lab hours are from Monday to Thursday 8am-12:30pm and 1pm-4pm and Friday 8am-12pm. You do not need an appointment if you come for labs during these times. If you'd like to go to a [facility] or [facility] closer to home, please call our clinic 48 hours prior to lab date so we can release orders in a timely manner.  Stay up to date on all routine vaccines: influenza, pneumonia, COVID19, Shingles  How to manage an injection site reaction: Remember the 5 C's: COUNTER - leave on the counter at least 30 minutes but up to overnight to bring medication to room temperature. This may help prevent stinging COLD - place something cold (like an ice gel pack or cold water bottle) on the injection site just before cleansing with alcohol . This may help reduce pain CLARITIN - use Claritin (generic name is loratadine) for the first two weeks of treatment or the day of, the day before, and the day after injecting. This will help to minimize injection site reactions CORTISONE CREAM - apply if injection site is irritated and itching CALL ME - if injection site reaction is bigger than the size of your fist, looks infected, blisters, or if you develop hives

## 2023-10-03 NOTE — Telephone Encounter (Signed)
 Pt scheduled for Enbrel  new start on 10/03/23

## 2023-10-03 NOTE — Progress Notes (Unsigned)
 Pharmacy Note  Subjective:   Patient presents to clinic today to receive first dose of ENBREL  for rheumatoid arthritis and psoriasis. Patient currently takes Tylenol  Arthritis. Unable to start Enbrel  in 2024 due to active tooth infection and loss to follow-up. Patient self-inject insulin  in abdomen  Patient running a fever or have signs/symptoms of infection? No  Patient currently on antibiotics for the treatment of infection? No  Patient have any upcoming invasive procedures/surgeries? No  Objective: CMP     Component Value Date/Time   NA 138 09/25/2023 1519   K 4.1 09/25/2023 1519   CL 95 (L) 09/25/2023 1519   CO2 33 (H) 09/25/2023 1519   GLUCOSE 247 (H) 09/25/2023 1519   BUN 15 09/25/2023 1519   CREATININE 4.61 (H) 09/25/2023 1519   CALCIUM  9.2 09/25/2023 1519   PROT 6.5 09/25/2023 1519   ALBUMIN 3.8 07/20/2020 2133   AST 14 09/25/2023 1519   ALT 10 09/25/2023 1519   ALKPHOS 108 07/20/2020 2133   BILITOT 0.5 09/25/2023 1519   GFRNONAA 14 03/12/2022 0834   GFRAA 6 (L) 03/13/2019 0727    CBC    Component Value Date/Time   WBC 5.2 09/25/2023 1519   RBC 3.35 (L) 09/25/2023 1519   HGB 9.9 (L) 09/25/2023 1519   HCT 30.9 (L) 09/25/2023 1519   PLT 135 (L) 09/25/2023 1519   MCV 92.2 09/25/2023 1519   MCH 29.6 09/25/2023 1519   MCHC 32.0 09/25/2023 1519   RDW 13.8 09/25/2023 1519   LYMPHSABS 838 (L) 05/01/2022 1436   MONOABS 0.8 07/20/2020 2133   EOSABS 172 09/25/2023 1519   BASOSABS 31 09/25/2023 1519    Baseline Immunosuppressant Therapy Labs TB GOLD    Latest Ref Rng & Units 09/25/2023    3:19 PM  Quantiferon TB Gold  Quantiferon TB Gold Plus NEGATIVE NEGATIVE    Hepatitis Panel    Latest Ref Rng & Units 05/01/2022    2:36 PM  Hepatitis  Hep B Surface Ag NON-REACTIVE NON-REACTIVE   Hep B IgM NON-REACTIVE NON-REACTIVE   Hep C Ab NON-REACTIVE NON-REACTIVE    HIV Lab Results  Component Value Date   HIV Non Reactive 08/08/2018   Immunoglobulins    SPEP    Latest Ref Rng & Units 09/25/2023    3:19 PM  Serum Protein Electrophoresis  Total Protein 6.1 - 8.1 g/dL 6.5    H3EI No results found for: G6PDH TPMT No results found for: TPMT   Chest x-ray: 07/20/2020 Small bilateral pleural effusions and peribronchial thickening, suggesting pulmonary edema.  Assessment/Plan:  Reviewed importance of holding ENBREL  with signs/symptoms of an infections, if antibiotics are prescribed to treat an active infection, and with invasive procedures  Demonstrated proper injection technique with Enbrel  Autotouch demo device  Patient able to demonstrate proper injection technique using the teach back method.  Patient self injected in the left lower abdomen with:  Sample Medication: Enbrel  Autotouch Injector Device (patient to cook) NDC: [address]-98 Lot: [id number] Expiration: 12/14/2025  Pharmacy-supplied Medication: Enbrel  Mini Cartridge 50mg /mL NDC: [address]-98 Lot: [id number] Expiration: 06/14/2024  Patient tolerated well.  Observed for 30 mins in office for adverse reaction. Patient denies itchiness and irritation at injection., No swelling or redness noted., and Reviewed injection site reaction management with patient verbally and printed information for review in AVS  Patient is to return in 1 month for labs and 6-8 weeks for follow-up appointment.  Standing orders for CBC/CMP placed.  TB gold will be monitored yearly.   ENBREL  approved through  insurance .   Rx sent to: [facility]: [phone] .  Patient provided with pharmacy phone number and advised to call later this week to schedule shipment to home.  Patient will continue ENBREL  50 mg subcut every 7 days as monotherapy.  All questions encouraged and answered.  Instructed patient to call with any further questions or concerns.  Sherry Pennant, PharmD, MPH, BCPS, CPP Clinical Pharmacist (Rheumatology and Pulmonology)  Andriette KYM Cleaves [facility] PharmD  Candidate Class of 2026   10/03/2023 9:07 AM

## 2023-10-04 NOTE — Progress Notes (Signed)
 I discussed / reviewed the pharmacy note by Andriette Cleaves, PharmD Candidate, and I agree with the findings and plans as documented. All appropriate orders and prescriptions have been placed.  Supervising provider: Jeannetta Lonni ORN, MD  Sherry Pennant, PharmD, MPH, BCPS, CPP Clinical Pharmacist [facility])

## 2023-10-21 ENCOUNTER — Other Ambulatory Visit: Payer: Self-pay

## 2023-10-21 ENCOUNTER — Other Ambulatory Visit (HOSPITAL_COMMUNITY): Payer: Self-pay

## 2023-10-22 ENCOUNTER — Other Ambulatory Visit: Payer: Self-pay

## 2023-10-22 NOTE — Progress Notes (Unsigned)
 Specialty Pharmacy Initial Fill Coordination Note  Alex Morrison is a 53 y.o. male contacted today regarding initial fill of specialty medication(s) Etanercept  (Enbrel  Mini)   Patient requested Delivery   Delivery date: 10/24/23   Verified address: [address], [place], [place] [address]   Medication will be filled on 10/8.   Patient is aware of $0 copayment.    **Pt does not use MyChart, please call for refills and mail welcome packet**

## 2023-10-24 NOTE — Progress Notes (Signed)
 Patient started Enbrel  in office on 10/03/2023 for RA and psoriasis. Patient currently takes Tylenol  Arthritis. Unable to start Enbrel  in 2024 due to active tooth infection and loss to follow-up. Patient does self-inject insulin  in abdomen   Patient will continue ENBREL  50 mg subcut every 7 days as monotherapy.  Sherry Pennant, PharmD, MPH, BCPS, CPP Clinical Pharmacist [facility])

## 2023-10-25 ENCOUNTER — Other Ambulatory Visit (HOSPITAL_COMMUNITY): Payer: Self-pay

## 2023-11-06 ENCOUNTER — Telehealth (INDEPENDENT_AMBULATORY_CARE_PROVIDER_SITE_OTHER): Payer: Self-pay | Admitting: Otolaryngology

## 2023-11-06 NOTE — Telephone Encounter (Signed)
 left vm to r/s 12/05/23 appointment

## 2023-11-13 ENCOUNTER — Other Ambulatory Visit: Payer: Self-pay

## 2023-11-15 ENCOUNTER — Other Ambulatory Visit: Payer: Self-pay

## 2023-11-19 ENCOUNTER — Other Ambulatory Visit (HOSPITAL_COMMUNITY): Payer: Self-pay

## 2023-12-04 ENCOUNTER — Telehealth (INDEPENDENT_AMBULATORY_CARE_PROVIDER_SITE_OTHER): Payer: Self-pay | Admitting: Otolaryngology

## 2023-12-04 NOTE — Telephone Encounter (Signed)
 LVM for patient to call back to reschedule 12/05/2023 appointment.  Dr. Soldatova no longer with practice.

## 2023-12-05 ENCOUNTER — Ambulatory Visit (INDEPENDENT_AMBULATORY_CARE_PROVIDER_SITE_OTHER): Admitting: Otolaryngology

## 2023-12-20 ENCOUNTER — Encounter (HOSPITAL_COMMUNITY): Payer: Self-pay | Admitting: General Surgery

## 2023-12-20 NOTE — Progress Notes (Deleted)
 Office Visit Note  Patient: Alex Morrison             Date of Birth: [birth year]           MRN: [id number]             PCP: Vernadine Charlie LELON, MD Referring: Vernadine Charlie LELON, MD Visit Date: 12/25/2023   Subjective:  No chief complaint on file.   History of Present Illness: ADIS STURGILL is a 53 y.o. male here for follow up  of his psoriatic arthritis.   Previous HPI 09/25/2023 KAIRYN OLMEDA is a 53 year old male who presents for follow-up of his psoriatic arthritis.   He has ongoing joint inflammation and previously underwent fluid aspiration from the knee. The initiation of Enbrel  was delayed due to a tooth infection, and due to lack of follow up after that original appointment he has not yet started this treatment.   He has been using Tylenol  Arthritis, which was effective until about three months ago when his symptoms began to flare up again. The pain is widespread, affecting his knees, ankles, hands, hips, and shoulders.   He has a history of a total knee replacement and is currently on dialysis for end-stage kidney disease.       Previous HPI 05/21/2022 ONOFRE GAINS is a 53 y.o. male here for follow up for inflammatory arthritis suspected PsA vs seronegative RA with left knee aspiration and injection last visit. Knee remains partially improved although redeveloping swelling quickly. He has continued joint pain and stiffness in both hands. Right shoulder is worst problem today. Labs at initial visit were consistent with ESRD, negative for hepatitis or TB screening, and synovial fluid with 82999 WBCs and no crystals.   Previous HPI 05/01/22 LENIX KIDD is a 53 y.o. male here for evaluation of joint pain and swelling at multiple sites with considerable symptoms ongoing for the past 2 to 3 months.  His worst complaint area has mostly been with bilateral hand pain and swelling.  Also getting some symptoms in the left knee with associated swelling.  His right knee has  been consistently swollen and with limited range of motion ever since he had joint replacement surgery last year.  He avoids taking any oral prednisone as previous treatment because severely uncontrolled diabetes and so he avoids and refuses taking steroids.  Currently using Tylenol  and Aleve  as needed which are partially helpful.  He does have end-stage renal disease on hemodialysis secondary to hypertension and diabetes.  He also has a history of skin psoriasis but never previously diagnosed with associated inflammatory arthritis.  He was never on systemic medical treatment for this either due to low symptom severity.   Labs reviewed ESR 80 CRP 71   No Rheumatology ROS completed.   PMFS History:  Patient Active Problem List   Diagnosis Date Noted   Seronegative rheumatoid arthritis (HCC) 05/21/2022   Sedimentation rate elevation 05/01/2022   Arthrosis of knee 08/08/2021   S/P total knee replacement, right 08/08/2021   Tibial plateau fracture 03/06/2019   ESRD (end stage renal disease) (HCC) 08/08/2018   HTN (hypertension) 08/08/2018   SBO (small bowel obstruction) (HCC) 08/08/2018   Periumbilical hernia 08/08/2018   Abdominal hernia as complication of peritoneal dialysis 08/08/2018   Acute gangrenous appendicitis with perforation and peritonitis 09/27/2013   Type 1 diabetes mellitus (HCC) 09/27/2013   Acute kidney injury 09/27/2013   DM (diabetes mellitus) type 2, uncontrolled, with ketoacidosis (HCC) 07/17/2012  DKA (diabetic ketoacidosis) (HCC) 07/16/2012   Perirectal abscess 07/14/2012    Past Medical History:  Diagnosis Date   Abscess of buttock    Acute kidney injury 09/27/2013   Depression    Diabetes mellitus without complication (HCC)    Diabetic neuropathy (HCC)    Dialysis patient    M,W,F   ESRD (end stage renal disease) (HCC)    Glaucoma    HTN (hypertension) 08/08/2018   Kidney disease 2019   Obesity    Perforated appendix    Peritonitis   Psoriasis     Retinopathy     Family History  Problem Relation Age of Onset   Diabetes Mellitus II Mother    Heart disease Mother    Diabetes Mother    Diabetes Mellitus I Father    Diabetes Father    Diabetes Mellitus II Brother    Colon cancer Neg Hx    Esophageal cancer Neg Hx    Pancreatic cancer Neg Hx    Stomach cancer Neg Hx    Lung disease Neg Hx    Past Surgical History:  Procedure Laterality Date   ABSCESS      RECTAL    APPENDECTOMY     AV FISTULA PLACEMENT Left 01/13/2019   Procedure: LEFT ARM ARTERIOVENOUS (AV) FISTULA CREATION;  Surgeon: Harvey Carlin BRAVO, MD;  Location: [facility] OR;  Service: Vascular;  Laterality: Left;   CATARACT EXTRACTION     Ex lap with ileocecectomy  09/27/2013   Dr. Vanderbilt   HARDWARE REMOVAL Right 06/13/2021   Procedure: HARDWARE REMOVAL;  Surgeon: Josefina Chew, MD;  Location: [facility] OR;  Service: Orthopedics;  Laterality: Right;   ILEOCECETOMY     INCISION AND DRAINAGE     INCISIONAL HERNIA REPAIR N/A 08/11/2018   Procedure: OPEN RETRORECTUS REPAIR OF INCARCERATED INCISIONAL HERNIA WITH BILATERAL POSTERIOR COMPONENT SEPARATION;  Surgeon: Tanda Locus, MD;  Location: [facility] OR;  Service: General;  Laterality: N/A;   insertion of PD catheter  12/2017   IR FLUORO GUIDE CV LINE RIGHT  08/08/2018   IR US  GUIDE VASC ACCESS RIGHT  08/08/2018   LAPAROSCOPIC ABDOMINAL EXPLORATION N/A 09/27/2013   LAPAROSCOPIC ASSISTED VENTRAL HERNIA REPAIR  2019   LAPAROSCOPY N/A 09/27/2013   Procedure: LAPAROSCOPY DIAGNOSTIC;  Surgeon: Debby Vanderbilt, MD;  Location: [facility] OR;  Service: General;  Laterality: N/A;   LAPAROTOMY  08/11/2018   Procedure: EXPLORATORY LAPAROTOMY;  Surgeon: Tanda Locus, MD;  Location: [facility] OR;  Service: General;;   LYSIS OF ADHESION  08/11/2018   Procedure: LYSIS OF ADHESIONS X 30 MINUTES;  Surgeon: Tanda Locus, MD;  Location: [facility] OR;  Service: General;;   MINOR REMOVAL OF PERITONEAL DIALYSIS CATHETER N/A 01/06/2019   Procedure: REMOVAL OF PERITONEAL DIALYSIS  CATHETER;  Surgeon: Stevie Herlene Righter, MD;  Location: [facility] ORS;  Service: General;  Laterality: N/A;   OPEN REDUCTION INTERNAL FIXATION (ORIF) TIBIA/FIBULA FRACTURE Right 03/07/2019   Procedure: OPEN REDUCTION INTERNAL FIXATION (ORIF) FIBULA FRACTURE;  Surgeon: Josefina Chew, MD;  Location: [facility] OR;  Service: Orthopedics;  Laterality: Right;   ORIF TIBIA PLATEAU Right 03/07/2019   Procedure: Open Reduction Internal Fixation (Orif) Tibial Plateau;  Surgeon: Josefina Chew, MD;  Location: [facility] OR;  Service: Orthopedics;  Laterality: Right;   SHOULDER SURGERY Left 1989   TOTAL KNEE ARTHROPLASTY Right 08/08/2021   Procedure: TOTAL KNEE ARTHROPLASTY;  Surgeon: Josefina Chew, MD;  Location: [facility] OR;  Service: Orthopedics;  Laterality: Right;   Social History   Social History Narrative  Not on file   Immunization History  Administered Date(s) Administered   Hepatitis B, ADULT 12/25/2017, 02/20/2018, 03/21/2018, 06/24/2018, 09/03/2018, 10/01/2018, 11/05/2018, 03/04/2019   Influenza,inj,Quad PF,6+ Mos 10/15/2018, 10/16/2019   Influenza,inj,quad, With Preservative 12/24/2017   Influenza-Unspecified 10/15/2018   PFIZER(Purple Top)SARS-COV-2 Vaccination 09/11/2019   Pneumococcal Conjugate-13 12/25/2017   Pneumococcal Polysaccharide-23 09/30/2013, 12/12/2018     Objective: Vital Signs: There were no vitals taken for this visit.   Physical Exam   Musculoskeletal Exam: ***  CDAI Exam: CDAI Score: -- Patient Global: --; Provider Global: -- Swollen: --; Tender: -- Joint Exam 12/25/2023   No joint exam has been documented for this visit   There is currently no information documented on the homunculus. Go to the Rheumatology activity and complete the homunculus joint exam.  Investigation: No additional findings.  Imaging: No results found.  Recent Labs: Lab Results  Component Value Date   WBC 5.2 09/25/2023   HGB 9.9 (L) 09/25/2023   PLT 135 (L) 09/25/2023   NA 138 09/25/2023   K  4.1 09/25/2023   CL 95 (L) 09/25/2023   CO2 33 (H) 09/25/2023   GLUCOSE 247 (H) 09/25/2023   BUN 15 09/25/2023   CREATININE 4.61 (H) 09/25/2023   BILITOT 0.5 09/25/2023   ALKPHOS 108 07/20/2020   AST 14 09/25/2023   ALT 10 09/25/2023   PROT 6.5 09/25/2023   ALBUMIN 3.8 07/20/2020   CALCIUM  9.2 09/25/2023   GFRAA 6 (L) 03/13/2019   QFTBGOLDPLUS NEGATIVE 09/25/2023    Speciality Comments: Only Check Blood Pressure in Right arm.   Procedures:  No procedures performed Allergies: Patient has no known allergies.   Assessment / Plan:     Visit Diagnoses: No diagnosis found.  ***  Orders: No orders of the defined types were placed in this encounter.  No orders of the defined types were placed in this encounter.    Follow-Up Instructions: No follow-ups on file.   Shalla Bulluck M Keigo Whalley, CMA  Note - This record has been created using Animal nutritionist.  Chart creation errors have been sought, but may not always  have been located. Such creation errors do not reflect on  the standard of medical care.

## 2023-12-25 ENCOUNTER — Other Ambulatory Visit: Payer: Self-pay | Admitting: Internal Medicine

## 2023-12-25 ENCOUNTER — Ambulatory Visit: Admitting: Internal Medicine

## 2023-12-25 DIAGNOSIS — Z79899 Other long term (current) drug therapy: Secondary | ICD-10-CM

## 2023-12-25 DIAGNOSIS — L4050 Arthropathic psoriasis, unspecified: Secondary | ICD-10-CM

## 2023-12-25 DIAGNOSIS — N186 End stage renal disease: Secondary | ICD-10-CM

## 2024-01-06 ENCOUNTER — Emergency Department (HOSPITAL_BASED_OUTPATIENT_CLINIC_OR_DEPARTMENT_OTHER)
Admission: EM | Admit: 2024-01-06 | Discharge: 2024-01-06 | Disposition: A | Discharge status: Home / Self Care | Attending: Emergency Medicine | Admitting: Emergency Medicine

## 2024-01-06 ENCOUNTER — Other Ambulatory Visit: Payer: Self-pay

## 2024-01-06 ENCOUNTER — Encounter (HOSPITAL_BASED_OUTPATIENT_CLINIC_OR_DEPARTMENT_OTHER): Payer: Self-pay | Admitting: Emergency Medicine

## 2024-01-06 DIAGNOSIS — S51011A Laceration without foreign body of right elbow, initial encounter: Secondary | ICD-10-CM | POA: Insufficient documentation

## 2024-01-06 DIAGNOSIS — W01118A Fall on same level from slipping, tripping and stumbling with subsequent striking against other sharp object, initial encounter: Secondary | ICD-10-CM | POA: Insufficient documentation

## 2024-01-06 DIAGNOSIS — Z79899 Other long term (current) drug therapy: Secondary | ICD-10-CM | POA: Diagnosis not present

## 2024-01-06 DIAGNOSIS — S59901A Unspecified injury of right elbow, initial encounter: Secondary | ICD-10-CM | POA: Diagnosis present

## 2024-01-06 DIAGNOSIS — Z23 Encounter for immunization: Secondary | ICD-10-CM | POA: Insufficient documentation

## 2024-01-06 MED ORDER — LIDOCAINE-EPINEPHRINE (PF) 2 %-1:200000 IJ SOLN
20.0000 mL | Freq: Once | INTRAMUSCULAR | Status: AC
  Administered 2024-01-06: 20 mL
  Filled 2024-01-06: qty 20

## 2024-01-06 MED ORDER — TETANUS-DIPHTH-ACELL PERTUSSIS 5-2-15.5 LF-MCG/0.5 IM SUSP
0.5000 mL | Freq: Once | INTRAMUSCULAR | Status: AC
  Administered 2024-01-06: 0.5 mL via INTRAMUSCULAR
  Filled 2024-01-06: qty 0.5

## 2024-01-06 NOTE — ED Notes (Signed)
 ED Provider at bedside.

## 2024-01-06 NOTE — ED Provider Notes (Signed)
 " [place] [facility] Provider Note   CSN: [id number] Arrival date & time: 01/06/24  1830     Patient presents with: Laceration   Alex Morrison is a 53 y.o. male who presented to the ED today with a laceration to the extensor surface of the right elbow.  He states that he incidentally tripped and fell causing his elbow to hit a steel door causing the laceration.  He was able to control bleeding with direct pressure and presents to the ED for laceration repair.  He denies having any active pain with motion of the left elbow and is able to supinate and pronate the left hand without difficulty.    Laceration      Prior to Admission medications  Medication Sig Start Date End Date Taking? Authorizing Provider  ACCU-CHEK GUIDE test strip USE TO TEST BLOOD SUGARS 4-5X A DAY (DX: ICD10: E11.29) for 90    [provider]  acetaminophen  (TYLENOL ) 500 MG tablet Take 2 tablets po every 8 hours PRN for mild pain, moderate pain, or fever Oral 08/19/18   [provider]  amLODipine  (NORVASC ) 10 MG tablet Take 10 mg by mouth daily.    [provider]  etanercept  (ENBREL  MINI) 50 MG/ML injection Inject 1 mL (50 mg total) into the skin once a week. 10/03/23   Rice, Lonni LELON, MD  famotidine  (PEPCID ) 20 MG tablet TAKE 1 TABLET BY MOUTH TWICE A DAY Patient not taking: Reported on 09/25/2023 06/17/23   Soldatova, Liuba, MD  fluticasone  (FLONASE ) 50 MCG/ACT nasal spray Place 2 sprays into both nostrils 2 (two) times daily. Patient not taking: Reported on 09/25/2023 06/05/23   Soldatova, Liuba, MD  HYDROcodone -acetaminophen  (NORCO) 10-325 MG tablet Take 1 tablet by mouth every 4 (four) hours as needed for severe pain. 08/08/21   Brown, Blaine K, PA-C  insulin  aspart (NOVOLOG ) 100 UNIT/ML FlexPen Inject 4-6 Units into the skin 3 (three) times daily as needed for high blood sugar.    [provider]  insulin  glargine (LANTUS ) 100 UNIT/ML  Solostar Pen Inject 8 Units into the skin at bedtime.    [provider]  levocetirizine (XYZAL  ALLERGY 24HR) 5 MG tablet Take 1 tablet (5 mg total) by mouth every evening. Patient not taking: Reported on 09/25/2023 06/05/23   Soldatova, Liuba, MD  lidocaine -prilocaine (EMLA) cream Apply topically. 11/18/19   [provider]  ondansetron  (ZOFRAN ) 4 MG tablet Take 1 tablet (4 mg total) by mouth every 8 (eight) hours as needed for nausea or vomiting. Patient not taking: Reported on 09/25/2023 06/13/21   Brown, Blaine K, PA-C  pantoprazole  (PROTONIX ) 40 MG tablet Take 40 mg by mouth daily. Patient not taking: Reported on 09/25/2023 03/27/22   [provider]  SEMGLEE , YFGN, 100 UNIT/ML Pen SMARTSIG:10 Unit(s) SUB-Q Daily 03/09/22   [provider]  sevelamer  carbonate (RENVELA ) 800 MG tablet Take 800-2,400 mg by mouth See admin instructions. Take 3 tablets (2400 mg) by mouth two times daily with meals, take 1-2 tablets [phone] mg) with snacks. 01/20/19   [provider]    Allergies: Patient has no known allergies.    Review of Systems  Skin:  Positive for wound.  All other systems reviewed and are negative.   Updated Vital Signs BP (!) 185/71 (BP Location: Right Arm)   Pulse 86   Temp 97.8 F (36.6 C)   Resp 18   SpO2 95%   Physical Exam Vitals and nursing note  reviewed.  Constitutional:      General: He is not in acute distress.    Appearance: Normal appearance.  HENT:     Head: Normocephalic and atraumatic.     Mouth/Throat:     Mouth: Mucous membranes are moist.     Pharynx: Oropharynx is clear.  Eyes:     Extraocular Movements: Extraocular movements intact.     Conjunctiva/sclera: Conjunctivae normal.     Pupils: Pupils are equal, round, and reactive to light.  Cardiovascular:     Rate and Rhythm: Normal rate and regular rhythm.     Pulses: Normal pulses.          Radial pulses are 2+ on the right side and 2+ on the left side.      Heart sounds: Normal heart sounds. No murmur heard.    No friction rub. No gallop.  Pulmonary:     Effort: Pulmonary effort is normal.     Breath sounds: Normal breath sounds.  Abdominal:     General: Abdomen is flat. Bowel sounds are normal.     Palpations: Abdomen is soft.  Musculoskeletal:        General: Normal range of motion.     Cervical back: Normal range of motion and neck supple.     Right lower leg: No edema.     Left lower leg: No edema.     Comments: Full range of motion without crepitus or deformity to the bilateral upper extremities, bilateral elbows.  Is able to supinate and pronate bilateral wrists without difficulty.  Skin:    General: Skin is warm and dry.     Capillary Refill: Capillary refill takes less than 2 seconds.     Findings: Laceration present.     Comments: Approximately 4 cm laceration to the extensor surface of the right elbow.  Neurological:     General: No focal deficit present.     Mental Status: He is alert. Mental status is at baseline.  Psychiatric:        Mood and Affect: Mood normal.     (all labs ordered are listed, but only abnormal results are displayed) Labs Reviewed - No data to display  EKG: None  Radiology: No results found.   .Laceration Repair  Date/Time: 01/06/2024 8:41 PM  Performed by: Myriam Dorn BROCKS, PA Authorized by: Myriam Dorn BROCKS, PA   Consent:    Consent obtained:  Verbal   Consent given by:  Patient   Risks, benefits, and alternatives were discussed: yes     Risks discussed:  Infection, pain, poor cosmetic result, poor wound healing and need for additional repair   Alternatives discussed:  No treatment, delayed treatment, observation and referral Universal protocol:    Procedure explained and questions answered to patient or proxy's satisfaction: yes     Patient identity confirmed:  Verbally with patient Anesthesia:    Anesthesia method:  Local infiltration   Local anesthetic:  Lidocaine  2% WITH  epi Laceration details:    Location:  Shoulder/arm   Shoulder/arm location:  R elbow   Length (cm):  4   Depth (mm):  3 Pre-procedure details:    Preparation:  Patient was prepped and draped in usual sterile fashion Exploration:    Limited defect created (wound extended): no     Hemostasis achieved with:  Direct pressure   Wound exploration: wound explored through full range of motion and entire depth of wound visualized     Wound extent: fascia not violated, no  foreign body, no signs of injury, no tendon damage and no underlying fracture     Contaminated: no   Treatment:    Area cleansed with:  Povidone-iodine  and saline   Amount of cleaning:  Standard   Irrigation solution:  Sterile saline   Irrigation volume:  500   Irrigation method:  Pressure wash   Debridement:  None   Undermining:  None   Scar revision: no   Skin repair:    Repair method:  Sutures   Suture size:  4-0   Suture material:  Nylon   Suture technique:  Simple interrupted   Number of sutures:  5 Approximation:    Approximation:  Close Repair type:    Repair type:  Simple Post-procedure details:    Dressing:  Antibiotic ointment and non-adherent dressing   Procedure completion:  Tolerated well, no immediate complications    Medications Ordered in the ED  lidocaine -EPINEPHrine  (XYLOCAINE  W/EPI) 2 %-1:200000 (PF) injection 20 mL (20 mLs Infiltration Given by Other 01/06/24 2014)  Tdap (ADACEL ) injection 0.5 mL (0.5 mLs Intramuscular Given 01/06/24 2036)                                    Medical Decision Making Risk Prescription drug management.   The patient has intact range of motion of the right elbow with no crepitus or deformities, as such at this time we will defer imaging of the left elbow as there is not appear to be any underlying fracture.  There also due to wound depth does not appear to be any retained foreign objects as this was explored thoroughly and probed and did not find any retained  foreign body in the wound.  As such wound was thoroughly irrigated as noted in the procedure note, and wound was successfully approximated as noted in procedure note as well.  This was tolerated without any difficulty, careful return precautions given, follow-up to primary care within 1-1/2 to 2 weeks for suture removal discussed.  Patient verbalizes understanding agreement has no further concerns at this time.  As such we will discharge with outpatient follow-up as previously discussed.     Final diagnoses:  Elbow laceration, right, initial encounter    ED Discharge Orders     None          Myriam Dorn BROCKS, [place] 01/06/24 2045    Patt Alm Macho, MD 01/06/24 [phone]  "

## 2024-01-06 NOTE — ED Triage Notes (Signed)
 Pt reports falling at work and hitting him rt elbow on floor. 2 inch gash on elbow. Bleeding controlled at this time.
